# Patient Record
Sex: Male | Born: 1935
Health system: Southern US, Community
[De-identification: ages and names within clinical notes are randomized; demographics above are authoritative.]

## PROBLEM LIST (undated history)

## (undated) DIAGNOSIS — K2100 Gastro-esophageal reflux disease with esophagitis, without bleeding: Secondary | ICD-10-CM

## (undated) DIAGNOSIS — E039 Hypothyroidism, unspecified: Secondary | ICD-10-CM

## (undated) DIAGNOSIS — K5790 Diverticulosis of intestine, part unspecified, without perforation or abscess without bleeding: Secondary | ICD-10-CM

## (undated) DIAGNOSIS — Z860101 Personal history of adenomatous and serrated colon polyps: Secondary | ICD-10-CM

## (undated) DIAGNOSIS — E349 Endocrine disorder, unspecified: Secondary | ICD-10-CM

## (undated) DIAGNOSIS — E785 Hyperlipidemia, unspecified: Secondary | ICD-10-CM

## (undated) DIAGNOSIS — I2699 Other pulmonary embolism without acute cor pulmonale: Secondary | ICD-10-CM

## (undated) DIAGNOSIS — K219 Gastro-esophageal reflux disease without esophagitis: Secondary | ICD-10-CM

## (undated) DIAGNOSIS — K589 Irritable bowel syndrome without diarrhea: Secondary | ICD-10-CM

## (undated) DIAGNOSIS — Z86010 Personal history of colonic polyps: Secondary | ICD-10-CM

## (undated) DIAGNOSIS — N200 Calculus of kidney: Secondary | ICD-10-CM

## (undated) DIAGNOSIS — K56609 Unspecified intestinal obstruction, unspecified as to partial versus complete obstruction: Secondary | ICD-10-CM

## (undated) DIAGNOSIS — M4800 Spinal stenosis, site unspecified: Secondary | ICD-10-CM

## (undated) DIAGNOSIS — K648 Other hemorrhoids: Secondary | ICD-10-CM

## (undated) DIAGNOSIS — Z87442 Personal history of urinary calculi: Secondary | ICD-10-CM

## (undated) DIAGNOSIS — H9190 Unspecified hearing loss, unspecified ear: Secondary | ICD-10-CM

## (undated) DIAGNOSIS — D649 Anemia, unspecified: Secondary | ICD-10-CM

## (undated) DIAGNOSIS — I10 Essential (primary) hypertension: Secondary | ICD-10-CM

## (undated) DIAGNOSIS — M1990 Unspecified osteoarthritis, unspecified site: Secondary | ICD-10-CM

## (undated) DIAGNOSIS — N189 Chronic kidney disease, unspecified: Secondary | ICD-10-CM

## (undated) HISTORY — DX: Personal history of colonic polyps: Z86.010

## (undated) HISTORY — DX: Irritable bowel syndrome, unspecified: K58.9

## (undated) HISTORY — PX: TOTAL SHOULDER REPLACEMENT: SUR1217

## (undated) HISTORY — DX: Other hemorrhoids: K64.8

## (undated) HISTORY — DX: Unspecified intestinal obstruction, unspecified as to partial versus complete obstruction: K56.609

## (undated) HISTORY — DX: Diverticulosis of intestine, part unspecified, without perforation or abscess without bleeding: K57.90

## (undated) HISTORY — PX: JOINT REPLACEMENT: SHX530

## (undated) HISTORY — DX: Spinal stenosis, site unspecified: M48.00

## (undated) HISTORY — DX: Hemochromatosis, unspecified: E83.119

## (undated) HISTORY — PX: TONSILLECTOMY AND ADENOIDECTOMY: SUR1326

## (undated) HISTORY — DX: Endocrine disorder, unspecified: E34.9

## (undated) HISTORY — DX: Gastro-esophageal reflux disease with esophagitis: K21.0

## (undated) HISTORY — DX: Personal history of adenomatous and serrated colon polyps: Z86.0101

## (undated) HISTORY — DX: Calculus of kidney: N20.0

## (undated) HISTORY — DX: Hypothyroidism, unspecified: E03.9

## (undated) HISTORY — DX: Unspecified osteoarthritis, unspecified site: M19.90

## (undated) HISTORY — DX: Gastro-esophageal reflux disease with esophagitis, without bleeding: K21.00

## (undated) HISTORY — PX: OTHER SURGICAL HISTORY: SHX169

## (undated) HISTORY — PX: ROTATOR CUFF REPAIR: SHX139

## (undated) HISTORY — PX: APPENDECTOMY: SHX54

## (undated) HISTORY — DX: Hyperlipidemia, unspecified: E78.5

## (undated) HISTORY — PX: TOTAL KNEE ARTHROPLASTY: SHX125

## (undated) HISTORY — DX: Essential (primary) hypertension: I10

## (undated) HISTORY — DX: Anemia, unspecified: D64.9

---

## 1978-10-01 DIAGNOSIS — Z87442 Personal history of urinary calculi: Secondary | ICD-10-CM

## 1978-10-01 HISTORY — PX: MIDDLE EAR SURGERY: SHX713

## 1978-10-01 HISTORY — DX: Personal history of urinary calculi: Z87.442

## 1979-10-02 HISTORY — PX: OTHER SURGICAL HISTORY: SHX169

## 1998-03-11 ENCOUNTER — Ambulatory Visit (HOSPITAL_COMMUNITY)
Admission: RE | Admit: 1998-03-11 | Discharge: 1998-03-11 | Payer: Self-pay | Admitting: Physical Medicine & Rehabilitation

## 1999-01-10 ENCOUNTER — Encounter (HOSPITAL_COMMUNITY): Admission: RE | Admit: 1999-01-10 | Discharge: 1999-04-10 | Payer: Self-pay | Admitting: Gastroenterology

## 1999-01-10 ENCOUNTER — Ambulatory Visit (HOSPITAL_COMMUNITY): Admission: RE | Admit: 1999-01-10 | Discharge: 1999-01-10 | Payer: Self-pay | Admitting: Specialist

## 1999-01-10 ENCOUNTER — Encounter: Payer: Self-pay | Admitting: Specialist

## 1999-04-11 ENCOUNTER — Encounter (HOSPITAL_COMMUNITY): Admission: RE | Admit: 1999-04-11 | Discharge: 1999-07-10 | Payer: Self-pay | Admitting: Gastroenterology

## 1999-09-28 ENCOUNTER — Ambulatory Visit (HOSPITAL_COMMUNITY): Admission: RE | Admit: 1999-09-28 | Discharge: 1999-09-28 | Payer: Self-pay | Admitting: Gastroenterology

## 2002-08-12 ENCOUNTER — Inpatient Hospital Stay (HOSPITAL_COMMUNITY): Admission: EM | Admit: 2002-08-12 | Discharge: 2002-08-18 | Payer: Self-pay | Admitting: Emergency Medicine

## 2002-08-12 ENCOUNTER — Encounter: Payer: Self-pay | Admitting: Emergency Medicine

## 2002-08-13 ENCOUNTER — Encounter: Payer: Self-pay | Admitting: Gastroenterology

## 2002-08-14 ENCOUNTER — Encounter: Payer: Self-pay | Admitting: Gastroenterology

## 2002-08-15 ENCOUNTER — Encounter (INDEPENDENT_AMBULATORY_CARE_PROVIDER_SITE_OTHER): Payer: Self-pay | Admitting: *Deleted

## 2002-08-15 ENCOUNTER — Encounter: Payer: Self-pay | Admitting: Internal Medicine

## 2002-08-16 ENCOUNTER — Encounter: Payer: Self-pay | Admitting: Internal Medicine

## 2002-08-16 ENCOUNTER — Encounter: Payer: Self-pay | Admitting: Gastroenterology

## 2002-08-17 ENCOUNTER — Encounter: Payer: Self-pay | Admitting: Gastroenterology

## 2002-08-17 ENCOUNTER — Encounter (INDEPENDENT_AMBULATORY_CARE_PROVIDER_SITE_OTHER): Payer: Self-pay | Admitting: *Deleted

## 2002-08-24 ENCOUNTER — Encounter (INDEPENDENT_AMBULATORY_CARE_PROVIDER_SITE_OTHER): Payer: Self-pay | Admitting: *Deleted

## 2002-10-01 DIAGNOSIS — K56609 Unspecified intestinal obstruction, unspecified as to partial versus complete obstruction: Secondary | ICD-10-CM

## 2002-10-01 HISTORY — DX: Unspecified intestinal obstruction, unspecified as to partial versus complete obstruction: K56.609

## 2003-01-31 ENCOUNTER — Encounter: Payer: Self-pay | Admitting: Emergency Medicine

## 2003-01-31 ENCOUNTER — Inpatient Hospital Stay (HOSPITAL_COMMUNITY): Admission: EM | Admit: 2003-01-31 | Discharge: 2003-02-03 | Payer: Self-pay | Admitting: Emergency Medicine

## 2003-01-31 ENCOUNTER — Encounter (INDEPENDENT_AMBULATORY_CARE_PROVIDER_SITE_OTHER): Payer: Self-pay | Admitting: *Deleted

## 2003-02-01 ENCOUNTER — Encounter: Payer: Self-pay | Admitting: Internal Medicine

## 2003-02-01 ENCOUNTER — Encounter (INDEPENDENT_AMBULATORY_CARE_PROVIDER_SITE_OTHER): Payer: Self-pay | Admitting: *Deleted

## 2003-02-02 ENCOUNTER — Encounter: Payer: Self-pay | Admitting: Internal Medicine

## 2003-02-03 ENCOUNTER — Encounter (INDEPENDENT_AMBULATORY_CARE_PROVIDER_SITE_OTHER): Payer: Self-pay | Admitting: *Deleted

## 2003-10-02 HISTORY — PX: REFRACTIVE SURGERY: SHX103

## 2003-10-02 HISTORY — PX: OTHER SURGICAL HISTORY: SHX169

## 2003-10-02 HISTORY — PX: COLONOSCOPY W/ POLYPECTOMY: SHX1380

## 2004-09-13 ENCOUNTER — Ambulatory Visit: Payer: Self-pay | Admitting: Internal Medicine

## 2004-12-04 ENCOUNTER — Encounter: Admission: RE | Admit: 2004-12-04 | Discharge: 2004-12-04 | Payer: Self-pay | Admitting: Neurological Surgery

## 2005-01-15 ENCOUNTER — Ambulatory Visit: Payer: Self-pay | Admitting: Internal Medicine

## 2005-01-22 ENCOUNTER — Ambulatory Visit: Payer: Self-pay | Admitting: Internal Medicine

## 2005-04-09 ENCOUNTER — Ambulatory Visit: Payer: Self-pay | Admitting: Internal Medicine

## 2005-05-01 ENCOUNTER — Ambulatory Visit: Payer: Self-pay | Admitting: Internal Medicine

## 2005-08-16 ENCOUNTER — Ambulatory Visit: Payer: Self-pay | Admitting: Internal Medicine

## 2005-08-17 ENCOUNTER — Ambulatory Visit: Payer: Self-pay | Admitting: Internal Medicine

## 2005-08-28 ENCOUNTER — Ambulatory Visit: Payer: Self-pay | Admitting: Internal Medicine

## 2006-02-22 ENCOUNTER — Ambulatory Visit: Payer: Self-pay | Admitting: Internal Medicine

## 2006-09-20 ENCOUNTER — Ambulatory Visit: Payer: Self-pay | Admitting: Internal Medicine

## 2006-09-20 LAB — CONVERTED CEMR LAB
Creatinine,U: 247.9 mg/dL
Microalb Creat Ratio: 4.8 mg/g (ref 0.0–30.0)
Microalb, Ur: 1.2 mg/dL (ref 0.0–1.9)

## 2007-02-18 DIAGNOSIS — M48061 Spinal stenosis, lumbar region without neurogenic claudication: Secondary | ICD-10-CM

## 2007-02-18 DIAGNOSIS — D638 Anemia in other chronic diseases classified elsewhere: Secondary | ICD-10-CM

## 2007-02-18 DIAGNOSIS — K210 Gastro-esophageal reflux disease with esophagitis: Secondary | ICD-10-CM

## 2007-02-20 ENCOUNTER — Ambulatory Visit: Payer: Self-pay | Admitting: Internal Medicine

## 2007-04-03 ENCOUNTER — Encounter (INDEPENDENT_AMBULATORY_CARE_PROVIDER_SITE_OTHER): Payer: Self-pay | Admitting: *Deleted

## 2007-05-02 ENCOUNTER — Encounter: Payer: Self-pay | Admitting: Internal Medicine

## 2007-05-14 ENCOUNTER — Ambulatory Visit: Payer: Self-pay | Admitting: Internal Medicine

## 2007-05-14 DIAGNOSIS — E291 Testicular hypofunction: Secondary | ICD-10-CM

## 2007-05-26 ENCOUNTER — Telehealth (INDEPENDENT_AMBULATORY_CARE_PROVIDER_SITE_OTHER): Payer: Self-pay | Admitting: *Deleted

## 2007-05-27 ENCOUNTER — Ambulatory Visit: Payer: Self-pay | Admitting: Surgery

## 2007-05-27 ENCOUNTER — Encounter (INDEPENDENT_AMBULATORY_CARE_PROVIDER_SITE_OTHER): Payer: Self-pay | Admitting: Orthopedic Surgery

## 2007-05-27 ENCOUNTER — Inpatient Hospital Stay (HOSPITAL_COMMUNITY): Admission: AD | Admit: 2007-05-27 | Discharge: 2007-05-30 | Payer: Self-pay | Admitting: Orthopedic Surgery

## 2007-06-12 ENCOUNTER — Ambulatory Visit: Payer: Self-pay | Admitting: Internal Medicine

## 2007-06-27 ENCOUNTER — Encounter: Payer: Self-pay | Admitting: Internal Medicine

## 2007-07-16 ENCOUNTER — Ambulatory Visit: Payer: Self-pay | Admitting: Internal Medicine

## 2007-07-17 ENCOUNTER — Ambulatory Visit: Payer: Self-pay | Admitting: Internal Medicine

## 2007-08-11 ENCOUNTER — Encounter: Admission: RE | Admit: 2007-08-11 | Discharge: 2007-10-01 | Payer: Self-pay | Admitting: Orthopedic Surgery

## 2007-08-15 ENCOUNTER — Telehealth (INDEPENDENT_AMBULATORY_CARE_PROVIDER_SITE_OTHER): Payer: Self-pay | Admitting: *Deleted

## 2007-08-18 ENCOUNTER — Encounter: Payer: Self-pay | Admitting: Internal Medicine

## 2007-08-20 ENCOUNTER — Ambulatory Visit: Payer: Self-pay | Admitting: Internal Medicine

## 2007-09-01 ENCOUNTER — Encounter: Payer: Self-pay | Admitting: Internal Medicine

## 2007-09-08 ENCOUNTER — Encounter: Payer: Self-pay | Admitting: Internal Medicine

## 2007-09-18 ENCOUNTER — Ambulatory Visit: Payer: Self-pay | Admitting: Internal Medicine

## 2007-09-22 ENCOUNTER — Telehealth (INDEPENDENT_AMBULATORY_CARE_PROVIDER_SITE_OTHER): Payer: Self-pay | Admitting: *Deleted

## 2007-10-06 ENCOUNTER — Telehealth (INDEPENDENT_AMBULATORY_CARE_PROVIDER_SITE_OTHER): Payer: Self-pay | Admitting: *Deleted

## 2007-10-07 ENCOUNTER — Ambulatory Visit: Payer: Self-pay | Admitting: Internal Medicine

## 2007-10-09 LAB — CONVERTED CEMR LAB
AST: 20 units/L (ref 0–37)
Albumin: 4 g/dL (ref 3.5–5.2)
Alkaline Phosphatase: 66 units/L (ref 39–117)
BUN: 9 mg/dL (ref 6–23)
Creatinine, Ser: 1 mg/dL (ref 0.4–1.5)
Direct LDL: 109 mg/dL
HDL: 30.4 mg/dL — ABNORMAL LOW (ref >39.0)
Microalb, Ur: 0.7 mg/dL (ref 0.0–1.9)
Total Bilirubin: 0.9 mg/dL (ref 0.3–1.2)
Total Protein: 6.7 g/dL (ref 6.0–8.3)
Triglycerides: 247 mg/dL (ref 0–149)

## 2007-10-10 ENCOUNTER — Encounter (INDEPENDENT_AMBULATORY_CARE_PROVIDER_SITE_OTHER): Payer: Self-pay | Admitting: *Deleted

## 2007-10-16 ENCOUNTER — Ambulatory Visit: Payer: Self-pay | Admitting: Internal Medicine

## 2007-10-16 DIAGNOSIS — E785 Hyperlipidemia, unspecified: Secondary | ICD-10-CM | POA: Insufficient documentation

## 2007-10-16 LAB — CONVERTED CEMR LAB
Cholesterol, target level: 200 mg/dL
LDL Goal: 100 mg/dL

## 2007-10-22 ENCOUNTER — Telehealth (INDEPENDENT_AMBULATORY_CARE_PROVIDER_SITE_OTHER): Payer: Self-pay | Admitting: *Deleted

## 2007-11-14 ENCOUNTER — Ambulatory Visit: Payer: Self-pay | Admitting: Internal Medicine

## 2007-12-15 ENCOUNTER — Encounter: Payer: Self-pay | Admitting: Internal Medicine

## 2007-12-23 ENCOUNTER — Encounter: Payer: Self-pay | Admitting: Internal Medicine

## 2007-12-25 ENCOUNTER — Encounter: Payer: Self-pay | Admitting: Internal Medicine

## 2008-01-07 ENCOUNTER — Telehealth (INDEPENDENT_AMBULATORY_CARE_PROVIDER_SITE_OTHER): Payer: Self-pay | Admitting: *Deleted

## 2008-01-16 ENCOUNTER — Ambulatory Visit: Payer: Self-pay | Admitting: Internal Medicine

## 2008-01-24 LAB — CONVERTED CEMR LAB
Creatinine,U: 169 mg/dL
Ferritin: 17.9 ng/mL — ABNORMAL LOW (ref 22.0–322.0)
HDL: 32.1 mg/dL — ABNORMAL LOW (ref >39.0)
Hgb A1c MFr Bld: 6.7 % — ABNORMAL HIGH (ref 4.6–6.0)
LDL Cholesterol: 102 mg/dL — ABNORMAL HIGH (ref 0–99)
Total CHOL/HDL Ratio: 5.3
Triglycerides: 173 mg/dL — ABNORMAL HIGH (ref 0–149)
VLDL: 35 mg/dL (ref 0–40)

## 2008-01-25 ENCOUNTER — Encounter (INDEPENDENT_AMBULATORY_CARE_PROVIDER_SITE_OTHER): Payer: Self-pay | Admitting: *Deleted

## 2008-01-26 ENCOUNTER — Ambulatory Visit: Payer: Self-pay | Admitting: Internal Medicine

## 2008-02-13 ENCOUNTER — Ambulatory Visit: Payer: Self-pay | Admitting: Internal Medicine

## 2008-03-15 ENCOUNTER — Ambulatory Visit: Payer: Self-pay | Admitting: Internal Medicine

## 2008-04-13 ENCOUNTER — Telehealth (INDEPENDENT_AMBULATORY_CARE_PROVIDER_SITE_OTHER): Payer: Self-pay | Admitting: *Deleted

## 2008-04-15 ENCOUNTER — Ambulatory Visit: Payer: Self-pay | Admitting: Internal Medicine

## 2008-04-22 ENCOUNTER — Encounter: Payer: Self-pay | Admitting: Family Medicine

## 2008-05-20 ENCOUNTER — Ambulatory Visit: Payer: Self-pay | Admitting: Internal Medicine

## 2008-05-24 ENCOUNTER — Encounter (INDEPENDENT_AMBULATORY_CARE_PROVIDER_SITE_OTHER): Payer: Self-pay | Admitting: *Deleted

## 2008-05-24 LAB — CONVERTED CEMR LAB
Hgb A1c MFr Bld: 6.3 % — ABNORMAL HIGH (ref 4.6–6.0)
LDL Cholesterol: 65 mg/dL (ref 0–99)
Total CHOL/HDL Ratio: 4
Triglycerides: 109 mg/dL (ref 0–149)

## 2008-05-27 ENCOUNTER — Ambulatory Visit: Payer: Self-pay | Admitting: Internal Medicine

## 2008-05-27 DIAGNOSIS — I10 Essential (primary) hypertension: Secondary | ICD-10-CM

## 2008-06-10 ENCOUNTER — Encounter (INDEPENDENT_AMBULATORY_CARE_PROVIDER_SITE_OTHER): Payer: Self-pay | Admitting: *Deleted

## 2008-06-24 ENCOUNTER — Ambulatory Visit: Payer: Self-pay | Admitting: Internal Medicine

## 2008-07-19 ENCOUNTER — Ambulatory Visit: Payer: Self-pay | Admitting: Internal Medicine

## 2008-07-28 ENCOUNTER — Telehealth (INDEPENDENT_AMBULATORY_CARE_PROVIDER_SITE_OTHER): Payer: Self-pay | Admitting: *Deleted

## 2008-08-16 ENCOUNTER — Encounter: Payer: Self-pay | Admitting: Internal Medicine

## 2008-08-17 ENCOUNTER — Telehealth (INDEPENDENT_AMBULATORY_CARE_PROVIDER_SITE_OTHER): Payer: Self-pay | Admitting: *Deleted

## 2008-08-18 ENCOUNTER — Ambulatory Visit: Payer: Self-pay | Admitting: Internal Medicine

## 2008-08-23 ENCOUNTER — Telehealth (INDEPENDENT_AMBULATORY_CARE_PROVIDER_SITE_OTHER): Payer: Self-pay | Admitting: *Deleted

## 2008-09-16 ENCOUNTER — Ambulatory Visit: Payer: Self-pay | Admitting: Internal Medicine

## 2008-10-26 ENCOUNTER — Ambulatory Visit: Payer: Self-pay | Admitting: Internal Medicine

## 2008-11-03 ENCOUNTER — Encounter (INDEPENDENT_AMBULATORY_CARE_PROVIDER_SITE_OTHER): Payer: Self-pay | Admitting: *Deleted

## 2008-11-08 ENCOUNTER — Ambulatory Visit: Payer: Self-pay | Admitting: Internal Medicine

## 2008-11-08 LAB — CONVERTED CEMR LAB: Creatinine, Ser: 1.1 mg/dL (ref 0.4–1.5)

## 2008-11-10 ENCOUNTER — Ambulatory Visit: Payer: Self-pay | Admitting: Internal Medicine

## 2008-11-10 DIAGNOSIS — R109 Unspecified abdominal pain: Secondary | ICD-10-CM | POA: Insufficient documentation

## 2008-11-14 LAB — CONVERTED CEMR LAB
Basophils Relative: 0.2 % (ref 0.0–3.0)
Eosinophils Absolute: 0.1 10*3/uL (ref 0.0–0.7)
Eosinophils Relative: 1.8 % (ref 0.0–5.0)
Lymphocytes Relative: 19.4 % (ref 12.0–46.0)
MCV: 88.4 fL (ref 78.0–100.0)
Monocytes Relative: 5.2 % (ref 3.0–12.0)
Neutrophils Relative %: 73.4 % (ref 43.0–77.0)
RBC: 4.9 M/uL (ref 4.22–5.81)
Saturation Ratios: 55.4 % — ABNORMAL HIGH (ref 20.0–50.0)
WBC: 7.5 10*3/uL (ref 4.5–10.5)

## 2008-11-15 ENCOUNTER — Encounter (INDEPENDENT_AMBULATORY_CARE_PROVIDER_SITE_OTHER): Payer: Self-pay | Admitting: *Deleted

## 2008-12-20 ENCOUNTER — Ambulatory Visit: Payer: Self-pay | Admitting: Internal Medicine

## 2009-01-03 ENCOUNTER — Telehealth: Payer: Self-pay | Admitting: Internal Medicine

## 2009-02-15 ENCOUNTER — Encounter: Payer: Self-pay | Admitting: Internal Medicine

## 2009-02-22 ENCOUNTER — Encounter: Payer: Self-pay | Admitting: Internal Medicine

## 2009-03-08 ENCOUNTER — Telehealth (INDEPENDENT_AMBULATORY_CARE_PROVIDER_SITE_OTHER): Payer: Self-pay | Admitting: *Deleted

## 2009-05-03 ENCOUNTER — Telehealth (INDEPENDENT_AMBULATORY_CARE_PROVIDER_SITE_OTHER): Payer: Self-pay | Admitting: *Deleted

## 2009-05-18 ENCOUNTER — Telehealth (INDEPENDENT_AMBULATORY_CARE_PROVIDER_SITE_OTHER): Payer: Self-pay | Admitting: *Deleted

## 2009-05-18 ENCOUNTER — Ambulatory Visit: Payer: Self-pay | Admitting: Internal Medicine

## 2009-05-21 LAB — CONVERTED CEMR LAB: TSH: 0.61 microintl units/mL (ref 0.35–5.50)

## 2009-05-23 ENCOUNTER — Encounter (INDEPENDENT_AMBULATORY_CARE_PROVIDER_SITE_OTHER): Payer: Self-pay | Admitting: *Deleted

## 2009-05-23 ENCOUNTER — Telehealth (INDEPENDENT_AMBULATORY_CARE_PROVIDER_SITE_OTHER): Payer: Self-pay | Admitting: *Deleted

## 2009-06-10 ENCOUNTER — Encounter: Payer: Self-pay | Admitting: Internal Medicine

## 2009-06-15 ENCOUNTER — Telehealth (INDEPENDENT_AMBULATORY_CARE_PROVIDER_SITE_OTHER): Payer: Self-pay | Admitting: *Deleted

## 2009-07-07 ENCOUNTER — Ambulatory Visit: Payer: Self-pay | Admitting: Internal Medicine

## 2009-07-07 DIAGNOSIS — E039 Hypothyroidism, unspecified: Secondary | ICD-10-CM | POA: Insufficient documentation

## 2009-07-07 DIAGNOSIS — M1990 Unspecified osteoarthritis, unspecified site: Secondary | ICD-10-CM | POA: Insufficient documentation

## 2009-07-08 ENCOUNTER — Encounter (INDEPENDENT_AMBULATORY_CARE_PROVIDER_SITE_OTHER): Payer: Self-pay | Admitting: *Deleted

## 2009-08-08 ENCOUNTER — Encounter: Payer: Self-pay | Admitting: Internal Medicine

## 2009-08-08 ENCOUNTER — Inpatient Hospital Stay (HOSPITAL_COMMUNITY): Admission: RE | Admit: 2009-08-08 | Discharge: 2009-08-11 | Payer: Self-pay | Admitting: Orthopedic Surgery

## 2009-08-08 ENCOUNTER — Telehealth: Payer: Self-pay | Admitting: Internal Medicine

## 2009-09-05 ENCOUNTER — Encounter: Admission: RE | Admit: 2009-09-05 | Discharge: 2009-09-29 | Payer: Self-pay | Admitting: Orthopedic Surgery

## 2009-09-29 ENCOUNTER — Encounter: Admission: RE | Admit: 2009-09-29 | Discharge: 2009-10-31 | Payer: Self-pay | Admitting: Orthopedic Surgery

## 2009-10-03 ENCOUNTER — Telehealth (INDEPENDENT_AMBULATORY_CARE_PROVIDER_SITE_OTHER): Payer: Self-pay | Admitting: *Deleted

## 2009-10-12 ENCOUNTER — Ambulatory Visit: Payer: Self-pay | Admitting: Internal Medicine

## 2009-10-14 ENCOUNTER — Encounter: Payer: Self-pay | Admitting: Internal Medicine

## 2009-10-16 LAB — CONVERTED CEMR LAB: TSH: 1.19 microintl units/mL (ref 0.35–5.50)

## 2009-10-17 ENCOUNTER — Encounter (INDEPENDENT_AMBULATORY_CARE_PROVIDER_SITE_OTHER): Payer: Self-pay | Admitting: *Deleted

## 2009-11-29 ENCOUNTER — Telehealth (INDEPENDENT_AMBULATORY_CARE_PROVIDER_SITE_OTHER): Payer: Self-pay | Admitting: *Deleted

## 2009-12-20 ENCOUNTER — Telehealth (INDEPENDENT_AMBULATORY_CARE_PROVIDER_SITE_OTHER): Payer: Self-pay | Admitting: *Deleted

## 2010-01-12 ENCOUNTER — Encounter: Payer: Self-pay | Admitting: Internal Medicine

## 2010-02-08 ENCOUNTER — Encounter: Payer: Self-pay | Admitting: Internal Medicine

## 2010-03-13 ENCOUNTER — Telehealth (INDEPENDENT_AMBULATORY_CARE_PROVIDER_SITE_OTHER): Payer: Self-pay | Admitting: *Deleted

## 2010-03-22 ENCOUNTER — Encounter: Payer: Self-pay | Admitting: Internal Medicine

## 2010-03-23 ENCOUNTER — Encounter: Payer: Self-pay | Admitting: Internal Medicine

## 2010-05-17 ENCOUNTER — Encounter: Payer: Self-pay | Admitting: Internal Medicine

## 2010-07-10 ENCOUNTER — Encounter: Payer: Self-pay | Admitting: Internal Medicine

## 2010-08-15 ENCOUNTER — Encounter: Payer: Self-pay | Admitting: Internal Medicine

## 2010-08-16 ENCOUNTER — Telehealth (INDEPENDENT_AMBULATORY_CARE_PROVIDER_SITE_OTHER): Payer: Self-pay | Admitting: *Deleted

## 2010-08-28 ENCOUNTER — Ambulatory Visit: Payer: Self-pay | Admitting: Internal Medicine

## 2010-08-28 DIAGNOSIS — N453 Epididymo-orchitis: Secondary | ICD-10-CM | POA: Insufficient documentation

## 2010-08-28 LAB — CONVERTED CEMR LAB
BUN: 18 mg/dL (ref 6–23)
Basophils Relative: 0.4 % (ref 0.0–3.0)
Bilirubin Urine: NEGATIVE
Eosinophils Relative: 1.3 % (ref 0.0–5.0)
HCT: 41.7 % (ref 39.0–52.0)
Hemoglobin: 14.1 g/dL (ref 13.0–17.0)
Hgb A1c MFr Bld: 6.7 % — ABNORMAL HIGH (ref 4.6–6.5)
Lymphocytes Relative: 26.1 % (ref 12.0–46.0)
MCHC: 33.9 g/dL (ref 30.0–36.0)
Monocytes Relative: 11.9 % (ref 3.0–12.0)
Neutrophils Relative %: 60.3 % (ref 43.0–77.0)
PSA: 0.86 ng/mL (ref 0.10–4.00)
Potassium: 4.8 meq/L (ref 3.5–5.1)
Protein, U semiquant: NEGATIVE
Specific Gravity, Urine: 1.025
Urobilinogen, UA: 0.2
pH: 6

## 2010-08-29 ENCOUNTER — Ambulatory Visit: Payer: Self-pay | Admitting: Diagnostic Radiology

## 2010-08-29 ENCOUNTER — Ambulatory Visit (HOSPITAL_BASED_OUTPATIENT_CLINIC_OR_DEPARTMENT_OTHER)
Admission: RE | Admit: 2010-08-29 | Discharge: 2010-08-29 | Payer: Self-pay | Source: Home / Self Care | Admitting: Internal Medicine

## 2010-08-30 ENCOUNTER — Encounter: Payer: Self-pay | Admitting: Internal Medicine

## 2010-09-11 ENCOUNTER — Telehealth (INDEPENDENT_AMBULATORY_CARE_PROVIDER_SITE_OTHER): Payer: Self-pay | Admitting: *Deleted

## 2010-09-14 ENCOUNTER — Encounter: Payer: Self-pay | Admitting: Internal Medicine

## 2010-09-20 ENCOUNTER — Telehealth: Payer: Self-pay | Admitting: Internal Medicine

## 2010-10-09 ENCOUNTER — Ambulatory Visit: Admit: 2010-10-09 | Payer: Self-pay | Admitting: Gastroenterology

## 2010-10-09 ENCOUNTER — Telehealth: Payer: Self-pay | Admitting: Internal Medicine

## 2010-10-09 ENCOUNTER — Other Ambulatory Visit: Payer: Self-pay | Admitting: Physician Assistant

## 2010-10-09 ENCOUNTER — Ambulatory Visit
Admission: RE | Admit: 2010-10-09 | Discharge: 2010-10-09 | Payer: Self-pay | Source: Home / Self Care | Attending: Gastroenterology | Admitting: Gastroenterology

## 2010-10-09 DIAGNOSIS — K625 Hemorrhage of anus and rectum: Secondary | ICD-10-CM | POA: Insufficient documentation

## 2010-10-09 DIAGNOSIS — K5730 Diverticulosis of large intestine without perforation or abscess without bleeding: Secondary | ICD-10-CM | POA: Insufficient documentation

## 2010-10-09 DIAGNOSIS — R1084 Generalized abdominal pain: Secondary | ICD-10-CM | POA: Insufficient documentation

## 2010-10-09 DIAGNOSIS — E118 Type 2 diabetes mellitus with unspecified complications: Secondary | ICD-10-CM | POA: Insufficient documentation

## 2010-10-09 DIAGNOSIS — R109 Unspecified abdominal pain: Secondary | ICD-10-CM | POA: Insufficient documentation

## 2010-10-09 DIAGNOSIS — R197 Diarrhea, unspecified: Secondary | ICD-10-CM | POA: Insufficient documentation

## 2010-10-09 DIAGNOSIS — R110 Nausea: Secondary | ICD-10-CM | POA: Insufficient documentation

## 2010-10-09 DIAGNOSIS — F419 Anxiety disorder, unspecified: Secondary | ICD-10-CM | POA: Insufficient documentation

## 2010-10-09 LAB — COMPREHENSIVE METABOLIC PANEL
ALT: 16 U/L (ref 0–53)
AST: 17 U/L (ref 0–37)
Albumin: 3.9 g/dL (ref 3.5–5.2)
Alkaline Phosphatase: 69 U/L (ref 39–117)
BUN: 13 mg/dL (ref 6–23)
CO2: 29 mEq/L (ref 19–32)
Calcium: 9.1 mg/dL (ref 8.4–10.5)
Chloride: 106 mEq/L (ref 96–112)
Creatinine, Ser: 1.1 mg/dL (ref 0.4–1.5)
GFR: 71.24 mL/min (ref >60.00)
Glucose, Bld: 89 mg/dL (ref 70–99)
Potassium: 4.3 mEq/L (ref 3.5–5.1)
Sodium: 144 mEq/L (ref 135–145)
Total Bilirubin: 0.7 mg/dL (ref 0.3–1.2)
Total Protein: 7.1 g/dL (ref 6.0–8.3)

## 2010-10-09 LAB — CBC WITH DIFFERENTIAL/PLATELET
Basophils Absolute: 0.1 10*3/uL (ref 0.0–0.1)
Basophils Relative: 0.6 % (ref 0.0–3.0)
Eosinophils Absolute: 0.2 10*3/uL (ref 0.0–0.7)
Eosinophils Relative: 2.1 % (ref 0.0–5.0)
HCT: 42.1 % (ref 39.0–52.0)
Hemoglobin: 13.8 g/dL (ref 13.0–17.0)
Lymphocytes Relative: 29.1 % (ref 12.0–46.0)
Lymphs Abs: 3 10*3/uL (ref 0.7–4.0)
MCHC: 32.8 g/dL (ref 30.0–36.0)
MCV: 90.8 fl (ref 78.0–100.0)
Monocytes Absolute: 0.7 10*3/uL (ref 0.1–1.0)
Monocytes Relative: 6.3 % (ref 3.0–12.0)
Neutro Abs: 6.4 10*3/uL (ref 1.4–7.7)
Neutrophils Relative %: 61.9 % (ref 43.0–77.0)
Platelets: 311 10*3/uL (ref 150.0–400.0)
RBC: 4.63 Mil/uL (ref 4.22–5.81)
RDW: 13.8 % (ref 11.5–14.6)
WBC: 10.4 10*3/uL (ref 4.5–10.5)

## 2010-10-09 LAB — HIGH SENSITIVITY CRP: CRP, High Sensitivity: 65.61 mg/L — ABNORMAL HIGH (ref 0.00–5.00)

## 2010-10-10 ENCOUNTER — Encounter
Admission: RE | Admit: 2010-10-10 | Discharge: 2010-10-10 | Payer: Self-pay | Source: Home / Self Care | Attending: Gastroenterology | Admitting: Gastroenterology

## 2010-10-16 ENCOUNTER — Encounter: Payer: Self-pay | Admitting: Internal Medicine

## 2010-10-16 ENCOUNTER — Ambulatory Visit
Admission: RE | Admit: 2010-10-16 | Discharge: 2010-10-16 | Payer: Self-pay | Source: Home / Self Care | Attending: Gastroenterology | Admitting: Gastroenterology

## 2010-10-16 DIAGNOSIS — R933 Abnormal findings on diagnostic imaging of other parts of digestive tract: Secondary | ICD-10-CM | POA: Insufficient documentation

## 2010-10-16 DIAGNOSIS — K5289 Other specified noninfective gastroenteritis and colitis: Secondary | ICD-10-CM | POA: Insufficient documentation

## 2010-10-20 ENCOUNTER — Telehealth: Payer: Self-pay | Admitting: Internal Medicine

## 2010-10-24 ENCOUNTER — Telehealth: Payer: Self-pay | Admitting: Internal Medicine

## 2010-10-26 ENCOUNTER — Telehealth (INDEPENDENT_AMBULATORY_CARE_PROVIDER_SITE_OTHER): Payer: Self-pay | Admitting: *Deleted

## 2010-10-29 LAB — CONVERTED CEMR LAB
BUN: 14 mg/dL (ref 6–23)
Creatinine, Ser: 1 mg/dL (ref 0.4–1.5)

## 2010-10-31 ENCOUNTER — Telehealth: Payer: Self-pay | Admitting: Internal Medicine

## 2010-10-31 ENCOUNTER — Other Ambulatory Visit: Payer: Self-pay | Admitting: Internal Medicine

## 2010-10-31 ENCOUNTER — Ambulatory Visit
Admission: RE | Admit: 2010-10-31 | Discharge: 2010-10-31 | Payer: Self-pay | Source: Home / Self Care | Attending: Internal Medicine | Admitting: Internal Medicine

## 2010-10-31 DIAGNOSIS — D126 Benign neoplasm of colon, unspecified: Secondary | ICD-10-CM

## 2010-10-31 LAB — GLUCOSE, CAPILLARY
Glucose-Capillary: 129 mg/dL — ABNORMAL HIGH (ref 70–99)
Glucose-Capillary: 121 mg/dL — ABNORMAL HIGH (ref 70–99)

## 2010-10-31 LAB — HM COLONOSCOPY

## 2010-11-01 ENCOUNTER — Encounter: Payer: Self-pay | Admitting: Gastroenterology

## 2010-11-01 ENCOUNTER — Telehealth (INDEPENDENT_AMBULATORY_CARE_PROVIDER_SITE_OTHER): Payer: Self-pay | Admitting: *Deleted

## 2010-11-02 NOTE — Progress Notes (Signed)
Summary: Refill Request  Phone Note Refill Request Call back at [phone] Message from:  Pharmacy on March 13, 2010 1:49 PM  Refills Requested: Medication #1:  XANAX 0.5 MG  TABS 1 by mouth q 8 hours as needed only   Dosage confirmed as above?Dosage Confirmed   Supply Requested: 1 month   Last Refilled: 12/09/2009 Trixie Rude  Next Appointment Scheduled: none Initial call taken by: Harold Barban,  March 13, 2010 1:49 PM  Follow-up for Phone Call        RX was faxed to [phone] Follow-up by: Shonna Chock,  March 13, 2010 3:49 PM    Prescriptions: XANAX 0.5 MG  TABS (ALPRAZOLAM) 1 by mouth q 8 hours as needed only  #90 x 1   Entered by:   Shonna Chock   Authorized by:   Marga Melnick MD   Signed by:   Shonna Chock on 03/13/2010   Method used:   Printed then faxed to ...       [facility] [address] [id number] (retail)       [address]       [place], [place]  [address]       Ph: [phone] or [phone]       Fax: [phone]   RxID:   [id number]

## 2010-11-02 NOTE — Progress Notes (Signed)
Summary: sinus infection  Phone Note Call from Patient   Summary of Call: Pt left Vm that he thinks that he might have a sinus infection. pt c/o yellowish/greenish mucous. Pt would like to have antibiotics rx. called pt back and informed pt that he would need OV for antibiotics to be rx. pt states that wife is having surgery tomorrow so he is unable to come in. Offer pt appt for another day, pt decline stating that he will have to see what is going to happen with his wife first and if he is unable to shake it then he will call for OV...............Marland KitchenFelecia Deloach CMA  November 29, 2009 4:20 PM

## 2010-11-02 NOTE — Letter (Signed)
Summary: Diabetic Instructions  [facility]  [address] [place], [place] [address]   Phone: [phone]  Fax: [phone]    Louis [name] [birth year] MRN: [id number]     X  ORAL DIABETIC MEDICATION INSTRUCTIONS  The day before your procedure:   Take your diabetic pill as you do normally  The day of your procedure:   Do not take your diabetic pill    We will check your blood sugar levels during the admission process and again in Recovery before discharging you home  ________________________________________________________________________  _  _   INSULIN (LONG ACTING) MEDICATION INSTRUCTIONS (Lantus, NPH, 70/30, Humulin, Novolin-N)   The day before your procedure:   Take  your regular evening dose    The day of your procedure:   Do not take your morning dose    _  _   INSULIN (SHORT ACTING) MEDICATION INSTRUCTIONS (Regular, Humulog, Novolog)   The day before your procedure:   Do not take your evening dose   The day of your procedure:   Do not take your morning dose   _  _   INSULIN PUMP MEDICATION INSTRUCTIONS  We will contact the physician managing your diabetic care for written dosage instructions for the day before your procedure and the day of your procedure.  Once we have received the instructions, we will contact you.

## 2010-11-02 NOTE — Progress Notes (Signed)
Summary: Refill Request: Controlled Med  Phone Note Refill Request Call back at [phone] Message from:  Pharmacy on October 24, 2010 11:08 AM  Refills Requested: Medication #1:  HYDROCODONE-ACETAMINOPHEN 10-325 MG  TABS prn   Dosage confirmed as above?Dosage Confirmed   Supply Requested: [address] [facility]   Next Appointment Scheduled: none Initial call taken by: Harold Barban,  October 24, 2010 11:08 AM  Follow-up for Phone Call        Alfonse Flavors can you provide specific instruction for Hydrocodone the pharmacy will not take (as needed) for instruction  Follow-up by: Shonna Chock CMA,  October 25, 2010 8:28 AM  Additional Follow-up for Phone Call Additional follow up Details #1::        1 every 6 hrs as needed     Additional Follow-up for Phone Call Additional follow up Details #2::    faxed.  Follow-up by: Lucious Groves CMA,  October 25, 2010 9:24 AM  New/Updated Medications: HYDROCODONE-ACETAMINOPHEN 10-325 MG  TABS (HYDROCODONE-ACETAMINOPHEN) 1 every 6 hrs prn Prescriptions: HYDROCODONE-ACETAMINOPHEN 10-325 MG  TABS (HYDROCODONE-ACETAMINOPHEN) 1 every 6 hrs prn  #30 x 0   Entered and Authorized by:   Marga Melnick MD   Signed by:   Marga Melnick MD on 10/25/2010   Method used:   Printed then faxed to ...       [facility] (mail-order)       [address], [address] [place]  [address]       Ph: [phone]       Fax: [phone]   RxID:   [id number]

## 2010-11-02 NOTE — Assessment & Plan Note (Signed)
Summary: abd. pain/rectal bleeding   History of Present Illness Visit Type: Initial Visit Primary GI MD: Yancey Flemings MD Primary Provider: Marga Melnick, MD Chief Complaint: abdominal pain in lower region with rectal bleeding.   Has hx. of diverticulosis History of Present Illness:   VERY NICE 75 YO MALE KNOWN REMOTELY TO DR. PERRY. HE HAD A COLONOSCOPY 7-8 YEARS AGO WHICH SHOWED DIVERTICULOSIS, AND A SMALL POLYP PER THE PT (CHART IS IN THE WAREHOUSE).  HE COMES IN TODAY AS AN URGENT ADD ON WITH C/O ONSET ON 10/06/09 WITH ACUTE INTENSE ABDOMINAL CRAMPING, AND DIAPHORESIS ABOUT 2 HOURS AFTER EATING A VERY SPICY CHICKEN DISH AT [employer]. THIS WAS FOLLOWED BY NAUSEA, AND DIARRHEA. A COUPLE HOURS LATER HE HAD MORE INTENSE CRAMPS AND DIARRHEA, AND EVENTUALLY STARTED PASSING SOME BLOOD. YESTERDAY HE FELT POORLY, NO APPETITE, DIDN'T EAT OR DRINK MUCH, AND CONTINUED WITH DIARRHEA AND BLOOD WHICH WAS RED, WITH SMALL CLOTS. TODAY HE FEELS A LITTLE BETTER, STILL SORE OVER HIS LOWER ABDOMEN, ANOREXIC, AND HAS JUST PASSED GAS WITH SMALL AMTS OF BLOOD. DENIES DIZZINESS, LIGHTHEAEDNESS ETC. HE DID TAKE A COURSE OF ABX IN NOVEMBER FOR A UTI. NO OTHER EXPOSURES, ETC.   GI Review of Systems    Reports abdominal pain, bloating, loss of appetite, and  nausea.     Location of  Abdominal pain: lower abdomen.    Denies acid reflux, belching, chest pain, dysphagia with liquids, dysphagia with solids, heartburn, vomiting, vomiting blood, weight loss, and  weight gain.      Reports change in bowel habits, diverticulosis, hemorrhoids, and  rectal bleeding.     Denies anal fissure, black tarry stools, constipation, diarrhea, fecal incontinence, heme positive stool, irritable bowel syndrome, jaundice, light color stool, liver problems, and  rectal pain. Preventive Screening-Counseling & Management  Alcohol-Tobacco     Smoking Status: quit      Drug Use:  no.    Current Medications (verified): 1)  Metformin Hcl 500 Mg  Tabs (Metformin Hcl) .Marland Kitchen.. 1 By Mouth Two Times A Day 2)  Lisinopril 20 Mg Tabs (Lisinopril) .Marland Kitchen.. 1 By Mouth Once Daily 3)  Levoxyl 125 Mcg Tabs (Levothyroxine Sodium) .Marland Kitchen.. 1 By Mouth Once Daily 4)  Alprazolam 0.5 Mg Tabs (Alprazolam) .... 2 Daily As Needed 5)  Prilosec 20 Mg Cpdr (Omeprazole) .Marland Kitchen.. 1 By Mouth Once Daily 6)  Hydrocodone-Acetaminophen 10-325 Mg Tabs (Hydrocodone-Acetaminophen) .... Take As Needed Pain 7)  Aspirin 81 Mg Tbec (Aspirin) .Marland Kitchen.. 1 By Mouth Once Daily 8)  Multivitamins  Tabs (Multiple Vitamin) .Marland Kitchen.. 1 By Mouth Once Daily 9)  Crestor 10 Mg Tabs (Rosuvastatin Calcium) .Marland Kitchen.. 1 By Mouth Once Daily 10)  [employer] Antihistamine Allergy 25 Mg Caps (Diphenhydramine Hcl) .Marland Kitchen.. 1 By Mouth Once Daily 11)  Folic Acid 1 Mg Tabs (Folic Acid) .Marland Kitchen.. 1 By Mouth Once Daily 12)  Methotrexate 2.5 Mg Tabs (Methotrexate Sodium) .Marland Kitchen.. 1 By Mouth Once Daily 13)  Glimepiride 2 Mg Tabs (Glimepiride) .Marland Kitchen.. 1 By Mouth Once Daily 14)  Calcium 600 Mg Tabs (Calcium) .... 2 By Mouth Once Daily 15)  Vitamin D 400 Unit Caps (Cholecalciferol) .Marland Kitchen.. 1 By Mouth Once Daily  Allergies (verified): 1)  ! * Shellfish  Past History:  Past Medical History: AODM RHEUMATOID ARTHRITIS Diverticulosis Hyperlipidemia Hypertension ANXIETY HYPOTHYROIDISM  Past Surgical History: Appendectomy  Family History: Reviewed history and no changes required. Family History of Diabetes:  No FH of Colon Cancer:  Social History: Reviewed history and no changes required. retired Married 2 boys  1 girl Patient is a former smoker.  Alcohol Use - no Illicit Drug Use - no Smoking Status:  quit Drug Use:  no  Review of Systems       The patient complains of anxiety-new, arthritis/joint pain, back pain, blood in urine, and hearing problems.  The patient denies allergy/sinus, anemia, breast changes/lumps, change in vision, confusion, cough, coughing up blood, depression-new, fainting, fatigue, fever, headaches-new, heart  murmur, heart rhythm changes, itching, menstrual pain, muscle pains/cramps, night sweats, nosebleeds, pregnancy symptoms, shortness of breath, skin rash, sleeping problems, sore throat, swelling of feet/legs, swollen lymph glands, thirst - excessive , urination - excessive , urination changes/pain, urine leakage, vision changes, and voice change.         SEE HPI  Vital Signs:  Patient profile:   75 year old male Height:      72 inches Weight:      243 pounds BMI:     33.08 Pulse rate:   126 / minute BP sitting:   128 / 88  (left arm)  Vitals Entered By: Milford Cage NCMA (October 09, 2010 2:49 PM)  Physical Exam  General:  Well developed, well nourished, no acute distress. Head:  Normocephalic and atraumatic. Eyes:  PERRLA, no icterus. Lungs:  Clear throughout to auscultation. Heart:  Regular rate and rhythm; no murmurs, rubs,  or bruits. Abdomen:  SOFT, TENDER ACROSS LOWER ABDOMEN,LEFT GREATER THAN RIGHT, NO GUARDING, NO MASS OR HSM,BS+ Rectal:  MUCOID RED BLOOD ON GLOVE,EXTERNAL NONBLEEDING HEMORRHOID Extremities:  No clubbing, cyanosis, edema or deformities noted. Neurologic:  Alert and  oriented x4;  grossly normal neurologically. Psych:  Alert and cooperative. Normal mood and affect.  Impression & Recommendations:  Problem # 1:  HEMORRHAGE OF RECTUM AND ANUS (ICD-569.3) Assessment New 75 YO MALE WITH ACUTE ILLNESS WITH GENERALIZED LOWER ABDOMINAL PAIN, INTENSE CRAMPING, NAUSEA, AND BLOODY DIARRHEA. R/O SEGMENTAL ISCHEMIC COLITIS, R/O ACUTE INFECTIOUS COLITIS. PUSH FLUIDS  FULL LIQUID/SOFT DIET LABS AS BELOW OFFERED ANTIEMETIC HE WILL USE HYDROCODONE WHICH HE HAS AT HOME AS NEEDED FOR PAIN START CIPRO 500 MG TWICE DAILY X 10 DAYS  START FLAGYL 500 MG TWICE DAILY X 10 DAYS SCHEDULE FOR CT ABDOMEN/PELVIS WITHIN NEXT 24 HOURS,FURTHER PLANS BASED ON FINDINGS HE WILL EVENTUALLY NEED A FOLLOW UP COLONOSCOPY WILL REVIEW  OLD PAPER CHART. Orders: TLB-CBC Platelet -  w/Differential (85025-CBCD) TLB-CRP-High Sensitivity (C-Reactive Protein) (86140-FCRP) TLB-CMP (Comprehensive Metabolic Pnl) (80053-COMP) CT Abdomen/Pelvis with Contrast (CT Abd/Pelvis w/con)  Problem # 2:  DIABETES MELLITUS-TYPE II (ICD-250.00) Assessment: Comment Only  Problem # 3:  HYPERTENSION (ICD-401.9) Assessment: Comment Only  Problem # 4:  ANXIETY (ICD-300.00) Assessment: Comment Only  Problem # 5:  DIVERTICULOSIS-COLON (ICD-562.10) Assessment: Comment Only  Patient Instructions: 1)  Please go to lab, basement level. 2)  We scheduled the CT scan for Tues 10-10-2010. Arrive to [facility] [address] Phone [phone]. 3)  Please go to [facility] l this afternoon to pick up the perscription and contrast . 4)  We sent perscriptions for the antibiotics to [facility] [place]. 5)  Push fluids. 6)  Use the pain medication as needed. 7)  Copy sent to :Dr. Marga Melnick  8)  The medication list was reviewed and reconciled.  All changed / newly prescribed medications were explained.  A complete medication list was provided to the patient / caregiver.  Prescriptions: METRONIDAZOLE 500 MG TABS (METRONIDAZOLE) Take 1 tab twice daily x 10 days  #20 x 0   Entered by:  Pam Peterman NCMA   Authorized by:   Sammuel Cooper PA-c   Signed by:   Lowry Ram NCMA on 10/09/2010   Method used:   Electronically to        [id number]* (retail)       [address]       [place], [place]  [address]       Ph: [phone]       Fax: [phone]   RxID:   [id number] CIPRO 500 MG TABS (CIPROFLOXACIN HCL) Take  1 tab twice daily x 10 days  #20 x 0   Entered by:   Lowry Ram NCMA   Authorized by:   Sammuel Cooper PA-c   Signed by:   Lowry Ram NCMA on 10/09/2010   Method used:   Electronically to        [id number] (retail)       [address]       [place], [place]  [address]       Ph: [phone]       Fax: [phone]   RxID:   [id number]

## 2010-11-02 NOTE — Letter (Signed)
Summary: [facility]   Imported By: Lanelle Bal 02/15/2010 09:47:43  _____________________________________________________________________  External Attachment:    Type:   Image     Comment:   External Document

## 2010-11-02 NOTE — Letter (Signed)
Summary: [facility]   Imported By: Lanelle Bal 01/25/2010 14:15:20  _____________________________________________________________________  External Attachment:    Type:   Image     Comment:   External Document

## 2010-11-02 NOTE — Medication Information (Signed)
Summary: Letter Regarding ACE Inhibitor/[facility]  Letter Regarding ACE Inhibitor/[facility]   Imported By: Lanelle Bal 05/08/2010 11:12:17  _____________________________________________________________________  External Attachment:    Type:   Image     Comment:   External Document

## 2010-11-02 NOTE — Letter (Signed)
Summary: [facility]   Imported By: Lanelle Bal 04/10/2010 10:59:47  _____________________________________________________________________  External Attachment:    Type:   Image     Comment:   External Document

## 2010-11-02 NOTE — Progress Notes (Signed)
Summary: Refill Request  Phone Note Refill Request Call back at [phone] Message from:  Pharmacy on October 26, 2010 1:20 PM  Refills Requested: Medication #1:  CRESTOR 10 MG  TABS 1/2 tab every other day- DUE FOR LABS NOW   Dosage confirmed as above?Dosage Confirmed   Supply Requested: 23   Last Refilled: 08/16/2010 [facility]  Next Appointment Scheduled: none Initial call taken by: Harold Barban,  October 26, 2010 1:20 PM  Follow-up for Phone Call        I spoke with repersenative from [facility] and informed her patient is due for Labs and she indicated she will contact the patient and informed him  Last chlosterol check 12/2007 Patient needs Lipid/Hep 272.4/995.20 Follow-up by: Shonna Chock CMA,  October 26, 2010 2:24 PM

## 2010-11-02 NOTE — Letter (Signed)
Summary: [facility]   Imported By: Lanelle Bal 10/04/2010 11:36:23  _____________________________________________________________________  External Attachment:    Type:   Image     Comment:   External Document

## 2010-11-02 NOTE — Progress Notes (Signed)
Summary: stil no better  Phone Note Call from Patient Call back at Home Phone [phone]   Caller: Patient Summary of Call: Pt Left VM that he is about 95% better but he still has some swelling. pt would like a refill on antibiotics. Pls advise..........Marland KitchenFelecia Deloach CMA  September 11, 2010 1:00 PM   Follow-up for Phone Call        Nitrofurantoin 100 mg two times a day # 14. Repeat urine C&S if not well Follow-up by: Marga Melnick MD,  September 11, 2010 1:45 PM  Additional Follow-up for Phone Call Additional follow up Details #1::        Patient's wife informed of information above./[facility] CMA  September 11, 2010 2:30 PM     Prescriptions: NITROFURANTOIN MONOHYD MACRO 100 MG CAPS (NITROFURANTOIN MONOHYD MACRO) 1 two times a day  #14 x 0   Entered by:   Shonna Chock CMA   Authorized by:   Marga Melnick MD   Signed by:   Shonna Chock CMA on 09/11/2010   Method used:   Electronically to        [id number] (retail)       [address]       [place], [place]  [address]       Ph: [phone]       Fax: [phone]   RxID:   [id number]

## 2010-11-02 NOTE — Assessment & Plan Note (Signed)
Summary: F/U CT scan results, Infec. Colitis   History of Present Illness Visit Type: Follow-up Visit Primary GI MD: Yancey Flemings MD Primary Provider: Marga Melnick, MD Chief Complaint: Patient here to follow up on his CT Scan. He still complains of lower abdominal pain better then the first visit but still constant. Yesterday he states that his pain was increased and he had 6 BMS. History of Present Illness:   VERY NICE 75 YO MALE WHO WAS SEEN ON 10/10/10 WITH ACUTE ABDOMINAL PAIN, AND BLOODY STOOLS . HE WAS FELT TO HAVE EITHER AN INFECTIOUS OR AN ISCHEMIC COLITIS. LABS WERE NORMAL EXCEPT FOR  A CRP OF 65!Marland Kitchen HE UNDERWENT CT ABDOMEN /PELVIS ON 1/10 WHICH CONFIRMED A LEFT SIDED COLITIS WITH CIRCUMFERENTIAL WALL THICKENING SPLENIC FLEXURE THROUGH THE SIGMOID,ONE SMALL LIVER LESION MOST CONSISTENT WITH A HEMANGIOMA.  HE WAS GIVEN A COURSE OF CIPRO AND FLAGYL EMPIRICALLY. HE COMES IN TODAY FOR FOLLOW UP STATING HE HAD BEEN FEELING BETTER, HAS NOT HAD ONGOING BLEEDING, AND STOOLS HAVE BEEN SOFT. HE STILL HAS SOME LOWER ABDOMINAL DISCOMFORT, AND APPETITE IS OFF. YESTERDAY HE HAD A BAD DAY WITH MORE CRAMPING, AND HAD 8 BM'S- BUT FORMED ,NONBLOODY STOOL. HE FEELS BETTER TODAY.   GI Review of Systems    Reports abdominal pain, loss of appetite, and  nausea.     Location of  Abdominal pain: lower abdomen.    Denies acid reflux, belching, bloating, chest pain, dysphagia with liquids, dysphagia with solids, heartburn, vomiting, vomiting blood, weight loss, and  weight gain.        Denies anal fissure, black tarry stools, change in bowel habit, constipation, diarrhea, diverticulosis, fecal incontinence, heme positive stool, hemorrhoids, irritable bowel syndrome, jaundice, light color stool, liver problems, rectal bleeding, and  rectal pain.    Current Medications (verified): 1)  Levoxyl 125 Mcg  Tabs (Levothyroxine Sodium) .... Take One Tablet Daily 2)  Metformin Hcl 500 Mg  Tb24 (Metformin Hcl) .Marland Kitchen.. 1 By  Mouth Two Times A Day 3)  Xanax 0.5 Mg  Tabs (Alprazolam) .Marland Kitchen.. 1 By Mouth Q 8 Hours As Needed Only 4)  Prilosec Otc 20 Mg  Tbec (Omeprazole Magnesium) .Marland Kitchen.. 1 By Mouth Qd 5)  Hydrocodone-Acetaminophen 10-325 Mg  Tabs (Hydrocodone-Acetaminophen) .... Prn 6)  Crestor 10 Mg  Tabs (Rosuvastatin Calcium) .... 1/2 Tab Every Other Day- Due For Labs Now 7)  Folic Acid 1 Mg  Tabs (Folic Acid) .Marland Kitchen.. 1 By Mouth Qd 8)  Methotrexate (Anti-Rheumatic) 2.5 Mg  Tabs (Methotrexate (Anti-Rheumatic)) .... 6 Tabs Q Week 9)  Multivitamin With No Iron .... Qd 10)  Calcium and Vit D 66/400 .... 2 Tabs Daily 11)  Glimepiride 2 Mg  Tabs (Glimepiride) .... 1/2 Q Am 12)  Freestyle Lancets  Misc (Lancets) .... Use As Directed Bid 13)  Freestyle Lite Test  Strp (Glucose Blood) .... Use As Directed Bid 14)  Metronidazole 500 Mg Tabs (Metronidazole) .... Take One By Mouth Two Times A Day 15)  Ciprofloxacin Hcl 500 Mg Tabs (Ciprofloxacin Hcl) .... Take One By Mouth Two Times A Day 16)  Lisinopril 20 Mg Tabs (Lisinopril) .... Take One By Mouth Once Daily  Allergies: 1)  ! * Shellfish 2)  ! * Ivp Dye  Past History:  Past Medical History: Anemia-NOS, PMH of AODM HTN HYPERLIPIDEMIA DIVERTICULOSIS  testosterone deficiency reflux esophagitis spinal stenosis, Dr Ethelene Hal hyperglycemia, fasting  hemochromatosis Arthritis , Dr Jimmy Footman , MTX Rx Hypothyroidism  Past Surgical History: Reviewed history from 07/07/2009 and no changes  required. Appendectomy Cataract extraction both eyes 2005 right and left ear surgery 6962,9528 Rotator cuff repair 3 episodes in 4132,4401 Tonsillectomy YAG laser right and left eye 2005 nerve root block, multiple (11 total)  2002,2005,2006,2007,2008, 0272,5366 Dr Ethelene Hal left knee cartilage  resected 1958 shoulder ball replacement and rotator cuff surgery 05/27/2007, Dr Ranell Patrick  Family History: Reviewed history from 07/07/2009 and no changes required. M aunt DM brother  DM, suicide  04/2003 father late onset DM, CVA, MI  @ age 86 mother  CHF 2 to valvular heart disease brother hemochromatosis  Social History: Reviewed history from 07/07/2009 and no changes required. Former Smoker 1982 Retired Married Regular exercise-no Alcohol use-no due to MTX Rx  Review of Systems       The patient complains of arthritis/joint pain and back pain.  The patient denies allergy/sinus, anemia, anxiety-new, blood in urine, breast changes/lumps, change in vision, confusion, cough, coughing up blood, depression-new, fainting, fatigue, fever, headaches-new, hearing problems, heart murmur, heart rhythm changes, itching, muscle pains/cramps, night sweats, nosebleeds, shortness of breath, skin rash, sleeping problems, sore throat, swelling of feet/legs, swollen lymph glands, thirst - excessive, urination - excessive, urination changes/pain, urine leakage, vision changes, and voice change.         SEE HPI  Vital Signs:  Patient profile:   75 year old male Height:      72 inches Weight:      243.4 pounds BMI:     33.13 Pulse rate:   74 / minute Pulse rhythm:   regular BP sitting:   116 / 66  (left arm) Cuff size:   regular  Vitals Entered By: Harlow Mares CMA Duncan Dull) (October 16, 2010 1:49 PM)  Physical Exam  General:  Well developed, well nourished, no acute distress. Head:  Normocephalic and atraumatic. Eyes:  PERRLA, no icterus. Lungs:  Clear throughout to auscultation. Heart:  Regular rate and rhythm; no murmurs, rubs,  or bruits. Abdomen:  LARGE ,SOFT, MILD TENDERNESS ACROSS LOWER ABDOMEN, AND LMQ, NO REBOUND, NO MASS OR HSM,BS+ Rectal:  NOT REPEATED Extremities:  No clubbing, cyanosis, edema or deformities noted. Neurologic:  Alert and  oriented x4;  grossly normal neurologically. Psych:  Alert and cooperative. Normal mood and affect.   Impression & Recommendations:  Problem # 1:  COLITIS (ICD-558.9) Assessment New 75 YO MALE WITH AN ACUTE COLITIS  WITH CT FINDINGS  OF A SEGMENTAL LEFT SIDED COLITIS ? ISCHEMIC VS. INFECTIOUS  VS LESS LIKELY IBD- PT IMPROVED BUT PERSISTENT MILD SXS.  HE IS ASKED TO COMPLETE 10 DAYS OF CIPRO AND FLAGYL THEN STOP CONTINUE HYDROCODONE AS NEEDED BLAND DIET/SOFT SCHEDULE FOR COLONOSCOPY WITH DR. PERRY. PROCEDURE DISCUSSED IN DETAIL WITH PT AND HIS WIFE. Orders: Colonoscopy (Colon)  Problem # 2:  DIVERTICULOSIS-COLON (ICD-562.10) Assessment: Comment Only  Problem # 3:  HYPERTENSION, ESSENTIAL NOS (ICD-401.9) Assessment: Comment Only  Problem # 4:  HYPERLIPIDEMIA (ICD-272.2) Assessment: Comment Only  Problem # 5:  DIABETES MELLITUS, TYPE II, (ICD-250.02) Assessment: Comment Only  Problem # 6:  DEGENERATIVE JOINT DISEASE (ICD-715.90) Assessment: Comment Only ?RA    MAY EXPLAIN ELEVATED CRP AS WELL   Patient Instructions: 1)  We have scheduled the Colonscopy  with Dr. Marina Goodell on 10-31-2010. 2)  We have also given you Diabetic medication instructions. 3)  We sent the colonoscopy prep  Perscription to [facility] in [place]. 4)  Copy sent to : Megan Salon, Md 5)  The medication list was reviewed and reconciled.  All changed / newly prescribed medications were  explained.  A complete medication list was provided to the patient / caregiver. Prescriptions: MOVIPREP 100 GM  SOLR (PEG-KCL-NACL-NASULF-NA ASC-C) As per prep instructions.  #1 x 0   Entered by:   Lowry Ram NCMA   Authorized by:   Sammuel Cooper PA-c   Signed by:   Lowry Ram NCMA on 10/16/2010   Method used:   Electronically to        [facility] (retail)       [address]       [place], [place]  [address]       Ph: [phone]       Fax: [phone]   RxID:   [id number]

## 2010-11-02 NOTE — Letter (Signed)
Summary: [facility]   Imported By: Lanelle Bal 09/11/2010 10:33:59  _____________________________________________________________________  External Attachment:    Type:   Image     Comment:   External Document

## 2010-11-02 NOTE — Assessment & Plan Note (Signed)
Vital Signs:  Patient profile:   75 year old male Weight:      243.4 pounds BMI:     33.13 Temp:     100.5 degrees F oral Pulse rate:   72 / minute Resp:     17 per minute BP sitting:   118 / 72  (left arm) Cuff size:   large  Vitals Entered By: Shonna Chock CMA (August 28, 2010 2:22 PM) CC: 1.) Fever, swelling in private area, lower abdominal pain, blood in urine, and refill meds , Abdominal pain, Type 2 diabetes mellitus follow-up   CC:  1.) Fever, swelling in private area, lower abdominal pain, blood in urine, and refill meds , Abdominal pain, and Type 2 diabetes mellitus follow-up.  History of Present Illness: Abdominal Pain      This is a 75 year old man who presents with Abdominal pain since 08/26/2010. Actually his wife states he had mid back pain 11/25.  The patient reports  some constipation, but denies nausea, vomiting, diarrhea, melena, hematochezia, anorexia, and hematemesis.  The location of the pain is right  &  left lower quadrant  and suprapubic area.  The pain is described as intermittent, cramping in quality, and radiating to the back.  Associated symptoms include fever, intermittent hematuria  and dysuria.  The patient denies the following symptoms: coke colored  urine.  His L testicle swelled 11/26. Fever began 11/27 ; temp up to 101. Rx: none. He is on MTX weekly. Type 2 Diabetes Mellitus Follow-Up      The patient is also here for  Metformin refill &  for Type 2 diabetes mellitus follow-up.  The patient denies polyuria, polydipsia, blurred vision, and numbness of extremities.  The patient denies the following symptoms: neuropathic pain, orthostatic symptoms, poor wound healing, intermittent claudication, vision loss, and foot ulcer.  Since the last visit the patient reports  fair  dietary compliance, not exercising regularly, and not monitoring blood glucose. He requests a new glucometer. The patient reports having had no  DM retinopathy. Hypertension Follow-Up  The patient also presents for Hypertension follow-up.  The patient denies lightheadedness, headaches, and fatigue.  The patient denies the following associated symptoms: palpitations, syncope, leg edema, and pedal edema.  Compliance with medications (by patient report) has been near 100%.  Adjunctive measures currently  NOT used by the patient include salt restriction.  BP @ home averages 140/68.  Current Medications (verified): 1)  Levoxyl 125 Mcg  Tabs (Levothyroxine Sodium) .... Take One Tablet Daily 2)  Metoprolol Succinate 100 Mg  Tb24 (Metoprolol Succinate) .... 1/2 Tab Qd 3)  Metformin Hcl 500 Mg  Tb24 (Metformin Hcl) .Marland Kitchen.. 1 By Mouth Two Times A Day 4)  Xanax 0.5 Mg  Tabs (Alprazolam) .Marland Kitchen.. 1 By Mouth Q 8 Hours As Needed Only 5)  Prilosec Otc 20 Mg  Tbec (Omeprazole Magnesium) .Marland Kitchen.. 1 By Mouth Qd 6)  Hydrocodone-Acetaminophen 10-325 Mg  Tabs (Hydrocodone-Acetaminophen) .... Prn 7)  Crestor 10 Mg  Tabs (Rosuvastatin Calcium) .... 1/2 Tab Every Other Day- Due For Labs Now 8)  Folic Acid 1 Mg  Tabs (Folic Acid) .Marland Kitchen.. 1 By Mouth Qd 9)  Methotrexate (Anti-Rheumatic) 2.5 Mg  Tabs (Methotrexate (Anti-Rheumatic)) .... 6 Tabs Q Week 10)  Vitamin D 04540 Unit  Caps (Ergocalciferol) .... One Cap Every Other Week 11)  Multivitamin With No Iron .... Qd 12)  Calcium and Vit D 66/400 .... 2 Tabs Daily 13)  Antihistamines 25mg  .... Prn 14)  Asa  81mg  .... Qd 15)  Testosterone 300mg  Im .... Q Month 16)  Glimepiride 2 Mg  Tabs (Glimepiride) .... 1/2 Q Am 17)  Lisinopril 20 Mg Tabs (Lisinopril) .Marland Kitchen.. 1 Once Daily 18)  Viagra 100 Mg Tabs (Sildenafil Citrate) .... As Needed 19)  Vitamin D (Ergocalciferol) 50000 Unit Caps (Ergocalciferol) .Marland Kitchen.. 1 By Mouth Every Other Week 20)  Doxycycline Hyclate 100 Mg Tabs (Doxycycline Hyclate) .Marland Kitchen.. 1 By Mouth Once Daily 21)  Doxycycline Hyclate 100 Mg Caps (Doxycycline Hyclate) .Marland Kitchen.. 1 Two Times A Day ; Avoid Sun 22)  Freestyle Lancets  Misc (Lancets) .... Use As Directed  Bid 23)  Freestyle Lite Test  Strp (Glucose Blood) .... Use As Directed Bid  Allergies: 1)  ! * Shellfish  Review of Systems Resp:  Denies cough and sputum productive. GI:  Denies bloody stools, dark tarry stools, and diarrhea.  Physical Exam  General:  well-nourished,in no acute distress; alert,appropriate and cooperative throughout examination Eyes:  No corneal or conjunctival inflammation noted.No icterus Ears:  Hearing loss hindered history Mouth:  Oral mucosa and oropharynx without lesions or exudates.  Mild  pharyngeal erythema.   Lungs:  Normal respiratory effort, chest expands symmetrically. Lungs are clear to auscultation, no crackles or wheezes. Heart:  Normal rate and regular rhythm. S1 and S2 normal without gallop, murmur, click, rub or other extra sounds. Abdomen:  Bowel sounds positive but slightly decreased,abdomen soft  but  tender RLQ  without masses, organomegaly . Ventral hernia noted. Genitalia:  R testicle normal; L srotum swollen & very tender Skin:  Intact without suspicious lesions or rashes. No jaundice. Skin slightly damp Cervical Nodes:  No lymphadenopathy noted Axillary Nodes:  No palpable lymphadenopathy Psych:  memory intact for recent and remote, normally interactive, and good eye contact.     Impression & Recommendations:  Problem # 1:  FEVER (ICD-780.60)  Orders: Venipuncture (66440) Specimen Handling (34742) TLB-CBC Platelet - w/Differential (85025-CBCD)  Problem # 2:  ABDOMINAL PAIN (ICD-789.00)  lower quadrants; reffered rom # 3 vs Diverticulitis ( doubt)  Orders: Venipuncture (59563) Radiology Referral (Radiology) Specimen Handling (87564) TLB-CBC Platelet - w/Differential (85025-CBCD) : [place] of scrotum  Problem # 3:  DIABETES MELLITUS, TYPE II, UNCONTROLLED (ICD-250.02)  His updated medication list for this problem includes:    Metformin Hcl 500 Mg Tb24 (Metformin hcl) .Marland Kitchen... 1 by mouth two times a day    Glimepiride 2 Mg Tabs  (Glimepiride) .Marland Kitchen... 1/2 q am    Lisinopril 20 Mg Tabs (Lisinopril) .Marland Kitchen... 1 once daily  Orders: Venipuncture (33295) Specimen Handling (18841) TLB-Creatinine, Blood (82565-CREA) TLB-Potassium (K+) (84132-K) TLB-BUN (Urea Nitrogen) (84520-BUN) TLB-A1C / Hgb A1C (Glycohemoglobin) (83036-A1C)  Problem # 4:  HYPERTENSION, ESSENTIAL NOS (ICD-401.9)  controlled adequately  His updated medication list for this problem includes:    Metoprolol Succinate 100 Mg Tb24 (Metoprolol succinate) .Marland Kitchen... 1/2 tab qd    Lisinopril 20 Mg Tabs (Lisinopril) .Marland Kitchen... 1 once daily  Orders: Venipuncture (66063) TLB-Creatinine, Blood (82565-CREA) TLB-Potassium (K+) (84132-K) TLB-BUN (Urea Nitrogen) (84520-BUN)  Complete Medication List: 1)  Levoxyl 125 Mcg Tabs (Levothyroxine sodium) .... Take one tablet daily 2)  Metoprolol Succinate 100 Mg Tb24 (Metoprolol succinate) .... 1/2 tab qd 3)  Metformin Hcl 500 Mg Tb24 (Metformin hcl) .Marland Kitchen.. 1 by mouth two times a day 4)  Xanax 0.5 Mg Tabs (Alprazolam) .Marland Kitchen.. 1 by mouth q 8 hours as needed only 5)  Prilosec Otc 20 Mg Tbec (Omeprazole magnesium) .Marland Kitchen.. 1 by mouth qd 6)  Hydrocodone-acetaminophen 10-325 Mg Tabs (  Hydrocodone-acetaminophen) .... Prn 7)  Crestor 10 Mg Tabs (Rosuvastatin calcium) .... 1/2 tab every other day- due for labs now 8)  Folic Acid 1 Mg Tabs (Folic acid) .Marland Kitchen.. 1 by mouth qd 9)  Methotrexate (anti-rheumatic) 2.5 Mg Tabs (Methotrexate (anti-rheumatic)) .... 6 tabs q week 10)  Vitamin D 16109 Unit Caps (Ergocalciferol) .... One cap every other week 11)  Multivitamin With No Iron  .... Qd 12)  Calcium and Vit D 66/400  .... 2 tabs daily 13)  Antihistamines 25mg   .... Prn 14)  Asa 81mg   .... Qd 15)  Testosterone 300mg  Im  .... Q month 16)  Glimepiride 2 Mg Tabs (Glimepiride) .... 1/2 q am 17)  Lisinopril 20 Mg Tabs (Lisinopril) .Marland Kitchen.. 1 once daily 18)  Viagra 100 Mg Tabs (Sildenafil citrate) .... As needed 19)  Vitamin D (ergocalciferol) 50000 Unit Caps  (Ergocalciferol) .Marland Kitchen.. 1 by mouth every other week 20)  Doxycycline Hyclate 100 Mg Tabs (Doxycycline hyclate) .Marland Kitchen.. 1 by mouth once daily 21)  Doxycycline Hyclate 100 Mg Caps (Doxycycline hyclate) .Marland Kitchen.. 1 two times a day ; avoid sun 22)  Freestyle Lancets Misc (Lancets) .... Use as directed bid 23)  Freestyle Lite Test Strp (Glucose blood) .... Use as directed bid  Other Orders: UA Dipstick W/ Micro (manual) (60454) T-Culture, Urine (09811-91478) TLB-PSA (Prostate Specific Antigen) (84153-PSA)  Patient Instructions: 1)  Hold Methrotrexate & Metformin until notified. 2)  Drink as much fluid as you can tolerate for the next few days. 3)  See your eye doctor yearly to check for diabetic eye damage. 4)  Check your feet each night for sore areas, calluses or signs of infection. 5)  Take 650-1000mg  of Tylenol every 4-6 hours as needed for relief of pain or comfort of fever AVOID taking more than 4000mg   in a 24 hour period (can cause liver damage in higher doses). Prescriptions: FREESTYLE LITE TEST  STRP (GLUCOSE BLOOD) use as directed bid  #100 x 0   Entered by:   Lucious Groves CMA   Authorized by:   Marga Melnick MD   Signed by:   Lucious Groves CMA on 08/28/2010   Method used:   Electronically to        [facility] (retail)       [address]       [place], [place]  [address]       Ph: [phone]       Fax: [phone]   RxID:   [id number] FREESTYLE LANCETS  MISC (LANCETS) use as directed bid  #100 x 0   Entered by:   Lucious Groves CMA   Authorized by:   Marga Melnick MD   Signed by:   Lucious Groves CMA on 08/28/2010   Method used:   Electronically to        [facility] (retail)       [address]       [place], [place]  [address]       Ph: [phone]       Fax: [phone]   RxID:   [id number] DOXYCYCLINE HYCLATE 100 MG CAPS (DOXYCYCLINE HYCLATE) 1 two times a day ; avoid sun  #20 x 0   Entered and Authorized by:   Marga Melnick MD   Signed by:   Marga Melnick MD on  08/28/2010   Method used:   Print then Give to Patient   RxID:   [id number] LISINOPRIL 20 MG TABS (LISINOPRIL) 1 once daily  #90 x 1  Entered and Authorized by:   Marga Melnick MD   Signed by:   Marga Melnick MD on 08/28/2010   Method used:   Print then Give to Patient   RxID:   [id number] XANAX 0.5 MG  TABS (ALPRAZOLAM) 1 by mouth q 8 hours as needed only  #90 x 1   Entered and Authorized by:   Marga Melnick MD   Signed by:   Marga Melnick MD on 08/28/2010   Method used:   Print then Give to Patient   RxID:   [id number] METFORMIN HCL 500 MG  TB24 (METFORMIN HCL) 1 by mouth two times a day  #180 x 1   Entered and Authorized by:   Marga Melnick MD   Signed by:   Marga Melnick MD on 08/28/2010   Method used:   Print then Give to Patient   RxID:   [id number]    Orders Added: 1)  UA Dipstick W/ Micro (manual) [81000] 2)  Specimen Handling [99000] 3)  T-Culture, Urine [74259-56387] 4)  Est. Patient Level IV [56433] 5)  Venipuncture [29518] 6)  Radiology Referral [Radiology] 7)  TLB-PSA (Prostate Specific Antigen) [84153-PSA] 8)  Specimen Handling [99000] 9)  TLB-CBC Platelet - w/Differential [85025-CBCD] 10)  TLB-Creatinine, Blood [82565-CREA] 11)  TLB-Potassium (K+) [84132-K] 12)  TLB-BUN (Urea Nitrogen) [84520-BUN] 13)  TLB-A1C / Hgb A1C (Glycohemoglobin) [83036-A1C]    Laboratory Results   Urine Tests    Routine Urinalysis   Color: orange Appearance: Cloudy Glucose: negative   (Normal Range: Negative) Bilirubin: negative   (Normal Range: Negative) Ketone: negative   (Normal Range: Negative) Spec. Gravity: 1.025   (Normal Range: 1.003-1.035) Blood: large   (Normal Range: Negative) pH: 6.0   (Normal Range: 5.0-8.0) Protein: negative   (Normal Range: Negative) Urobilinogen: 0.2   (Normal Range: 0-1) Nitrite: negative   (Normal Range: Negative) Leukocyte Esterace: moderate   (Normal Range: Negative)    Comments: sent for  culture     CC:  1.) Fever, swelling in private area, lower abdominal pain, blood in urine, and refill meds , Abdominal pain, and Type 2 diabetes mellitus follow-up.  History of Present Illness: Abdominal Pain      This is a 75 year old man who presents with Abdominal pain since 08/26/2010. Actually his wife states he had mid back pain 11/25.  The patient reports  some constipation, but denies nausea, vomiting, diarrhea, melena, hematochezia, anorexia, and hematemesis.  The location of the pain is right  &  left lower quadrant  and suprapubic area.  The pain is described as intermittent, cramping in quality, and radiating to the back.  Associated symptoms include fever, intermittent hematuria  and dysuria.  The patient denies the following symptoms: coke colored  urine.  His L testicle swelled 11/26. Fever began 11/27 ; temp up to 101. Rx: none. He is on MTX weekly. Type 2 Diabetes Mellitus Follow-Up      The patient is also here for  Metformin refill &  for Type 2 diabetes mellitus follow-up.  The patient denies polyuria, polydipsia, blurred vision, and numbness of extremities.  The patient denies the following symptoms: neuropathic pain, orthostatic symptoms, poor wound healing, intermittent claudication, vision loss, and foot ulcer.  Since the last visit the patient reports  fair  dietary compliance, not exercising regularly, and not monitoring blood glucose. He requests a new glucometer. The patient reports having had no  DM retinopathy. Hypertension Follow-Up      The patient also presents for  Hypertension follow-up.  The patient denies lightheadedness, headaches, and fatigue.  The patient denies the following associated symptoms: palpitations, syncope, leg edema, and pedal edema.  Compliance with medications (by patient report) has been near 100%.  Adjunctive measures currently  NOT used by the patient include salt restriction.  BP @ home averages 140/68.   Physical Exam  General:   well-nourished,in no acute distress; alert,appropriate and cooperative throughout examination Eyes:  No corneal or conjunctival inflammation noted.No icterus Ears:  Hearing loss hindered history Mouth:  Oral mucosa and oropharynx without lesions or exudates.  Mild  pharyngeal erythema.   Lungs:  Normal respiratory effort, chest expands symmetrically. Lungs are clear to auscultation, no crackles or wheezes. Heart:  Normal rate and regular rhythm. S1 and S2 normal without gallop, murmur, click, rub or other extra sounds. Abdomen:  Bowel sounds positive but slightly decreased,abdomen soft  but  tender RLQ  without masses, organomegaly . Ventral hernia noted. Genitalia:  R testicle normal; L srotum swollen & very tender Skin:  Intact without suspicious lesions or rashes. No jaundice. Skin slightly damp Cervical Nodes:  No lymphadenopathy noted Axillary Nodes:  No palpable lymphadenopathy Psych:  memory intact for recent and remote, normally interactive, and good eye contact.      Impression & Recommendations:  Problem # 1:  FEVER (ICD-780.60)  Orders: Venipuncture (16109) Specimen Handling (60454) TLB-CBC Platelet - w/Differential (85025-CBCD)  Problem # 2:  ABDOMINAL PAIN (ICD-789.00)  lower quadrants; reffered rom # 3 vs Diverticulitis ( doubt)  Orders: Venipuncture (09811) Radiology Referral (Radiology) Specimen Handling (91478) TLB-CBC Platelet - w/Differential (85025-CBCD) : [place] of scrotum  Problem # 3:  DIABETES MELLITUS, TYPE II, UNCONTROLLED (ICD-250.02)  His updated medication list for this problem includes:    Metformin Hcl 500 Mg Tb24 (Metformin hcl) .Marland Kitchen... 1 by mouth two times a day    Glimepiride 2 Mg Tabs (Glimepiride) .Marland Kitchen... 1/2 q am    Lisinopril 20 Mg Tabs (Lisinopril) .Marland Kitchen... 1 once daily  Orders: Venipuncture ([address]) Specimen Handling (13086) TLB-Creatinine, Blood (82565-CREA) TLB-Potassium (K+) (84132-K) TLB-BUN (Urea Nitrogen) (84520-BUN) TLB-A1C / Hgb A1C  (Glycohemoglobin) (83036-A1C)  Problem # 4:  HYPERTENSION, ESSENTIAL NOS (ICD-401.9)  controlled adequately  His updated medication list for this problem includes:    Metoprolol Succinate 100 Mg Tb24 (Metoprolol succinate) .Marland Kitchen... 1/2 tab qd    Lisinopril 20 Mg Tabs (Lisinopril) .Marland Kitchen... 1 once daily  Orders: Venipuncture (57846) TLB-Creatinine, Blood (82565-CREA) TLB-Potassium (K+) (84132-K) TLB-BUN (Urea Nitrogen) (84520-BUN)  Complete Medication List: 1)  Levoxyl 125 Mcg Tabs (Levothyroxine sodium) .... Take one tablet daily 2)  Metoprolol Succinate 100 Mg Tb24 (Metoprolol succinate) .... 1/2 tab qd 3)  Metformin Hcl 500 Mg Tb24 (Metformin hcl) .Marland Kitchen.. 1 by mouth two times a day 4)  Xanax 0.5 Mg Tabs (Alprazolam) .Marland Kitchen.. 1 by mouth q 8 hours as needed only 5)  Prilosec Otc 20 Mg Tbec (Omeprazole magnesium) .Marland Kitchen.. 1 by mouth qd 6)  Hydrocodone-acetaminophen 10-325 Mg Tabs (Hydrocodone-acetaminophen) .... Prn 7)  Crestor 10 Mg Tabs (Rosuvastatin calcium) .... 1/2 tab every other day- due for labs now 8)  Folic Acid 1 Mg Tabs (Folic acid) .Marland Kitchen.. 1 by mouth qd 9)  Methotrexate (anti-rheumatic) 2.5 Mg Tabs (Methotrexate (anti-rheumatic)) .... 6 tabs q week 10)  Vitamin D 96295 Unit Caps (Ergocalciferol) .... One cap every other week 11)  Multivitamin With No Iron  .... Qd 12)  Calcium and Vit D 66/400  .... 2 tabs daily 13)  Antihistamines 25mg   .... Prn  14)  Asa 81mg   .... Qd 15)  Testosterone 300mg  Im  .... Q month 16)  Glimepiride 2 Mg Tabs (Glimepiride) .... 1/2 q am 17)  Lisinopril 20 Mg Tabs (Lisinopril) .Marland Kitchen.. 1 once daily 18)  Viagra 100 Mg Tabs (Sildenafil citrate) .... As needed 19)  Vitamin D (ergocalciferol) 50000 Unit Caps (Ergocalciferol) .Marland Kitchen.. 1 by mouth every other week 20)  Doxycycline Hyclate 100 Mg Tabs (Doxycycline hyclate) .Marland Kitchen.. 1 by mouth once daily 21)  Doxycycline Hyclate 100 Mg Caps (Doxycycline hyclate) .Marland Kitchen.. 1 two times a day ; avoid sun 22)  Freestyle Lancets Misc (Lancets)  .... Use as directed bid 23)  Freestyle Lite Test Strp (Glucose blood) .... Use as directed bid  Other Orders: UA Dipstick W/ Micro (manual) (19147) T-Culture, Urine (82956-21308) TLB-PSA (Prostate Specific Antigen) (84153-PSA)   Review of Systems Resp:  Denies cough and sputum productive. GI:  Denies bloody stools, dark tarry stools, and diarrhea.  Prescriptions: FREESTYLE LITE TEST  STRP (GLUCOSE BLOOD) use as directed bid  #100 x 0   Entered by:   Lucious Groves CMA   Authorized by:   Marga Melnick MD   Signed by:   Lucious Groves CMA on 08/28/2010   Method used:   Electronically to        [facility] (retail)       [address]       [place], [place]  [address]       Ph: [phone]       Fax: [phone]   RxID:   [id number] FREESTYLE LANCETS  MISC (LANCETS) use as directed bid  #100 x 0   Entered by:   Lucious Groves CMA   Authorized by:   Marga Melnick MD   Signed by:   Lucious Groves CMA on 08/28/2010   Method used:   Electronically to        [facility] (retail)       [address]       [place], [place]  [address]       Ph: [phone]       Fax: [phone]   RxID:   [id number] DOXYCYCLINE HYCLATE 100 MG CAPS (DOXYCYCLINE HYCLATE) 1 two times a day ; avoid sun  #20 x 0   Entered and Authorized by:   Marga Melnick MD   Signed by:   Marga Melnick MD on 08/28/2010   Method used:   Print then Give to Patient   RxID:   [id number] LISINOPRIL 20 MG TABS (LISINOPRIL) 1 once daily  #90 x 1   Entered and Authorized by:   Marga Melnick MD   Signed by:   Marga Melnick MD on 08/28/2010   Method used:   Print then Give to Patient   RxID:   [id number] XANAX 0.5 MG  TABS (ALPRAZOLAM) 1 by mouth q 8 hours as needed only  #90 x 1   Entered and Authorized by:   Marga Melnick MD   Signed by:   Marga Melnick MD on 08/28/2010   Method used:   Print then Give to Patient   RxID:   [id number] METFORMIN HCL 500 MG  TB24 (METFORMIN HCL) 1 by mouth two times a  day  #180 x 1   Entered and Authorized by:   Marga Melnick MD   Signed by:   Marga Melnick MD on 08/28/2010   Method used:   Print then Give to Patient   RxID:   [id number]

## 2010-11-02 NOTE — Letter (Signed)
Summary: [facility]  [facility]   Imported By: Lanelle Bal 07/22/2010 10:51:16  _____________________________________________________________________  External Attachment:    Type:   Image     Comment:   External Document

## 2010-11-02 NOTE — Progress Notes (Signed)
Summary: refill  Phone Note Refill Request Message from:  Fax from Pharmacy on [facility] [address] fax [phone]  alprazolam 0.5mg   Initial call taken by: Barb Merino,  October 03, 2009 11:11 AM    Prescriptions: Louis [name] 0.5 MG  TABS (ALPRAZOLAM) 1 by mouth q 8 hours as needed only  #90 x 1   Entered by:   Shonna Chock   Authorized by:   Marga Melnick MD   Signed by:   Shonna Chock on 10/03/2009   Method used:   Printed then faxed to ...       [facility] [address] [facility] (retail)       [address] [place], [place]  [address]       Ph: [phone] or [phone]       Fax: [phone]   RxID:   [id number]

## 2010-11-02 NOTE — Progress Notes (Signed)
Summary: Triage / Abdominal pain w/ bleeding  Phone Note Call from Patient Call back at Home Phone [phone]   Caller: Patient Call For: Dr. Marina Goodell Reason for Call: Talk to Nurse Summary of Call: Pt is having really bad stomach pains since saturday and also having alot of bright red blood  Initial call taken by: [place] Johnson,  October 09, 2010 9:39 AM  Follow-up for Phone Call        Pt. started having lower abd. cramping on Saturday along with b.rrb..Today had  bleeding in toilet and cramping continues.Says he has hx. of twisted bowels and diverticulitis. Given appt. with P.a. for this afternoon.Chart requested and is coming from warehouse. Follow-up by: Teryl Lucy RN,  October 09, 2010 10:34 AM  Additional Follow-up for Phone Call Additional follow up Details #1::        ok Additional Follow-up by: Hilarie Fredrickson MD,  October 09, 2010 10:50 AM

## 2010-11-02 NOTE — Op Note (Signed)
Summary: Lumbar Epidural Steroid Injection/[facility]   Lumbar Epidural Steroid Injection/[facility]   Imported By: Lanelle Bal 08/25/2010 11:42:43  _____________________________________________________________________  External Attachment:    Type:   Image     Comment:   External Document

## 2010-11-02 NOTE — Progress Notes (Signed)
Summary: refill  Phone Note Refill Request Message from:  Fax from Pharmacy on December 20, 2009 11:04 AM  Refills Requested: Medication #1:  CRESTOR 10 MG  TABS 1/2 tab every other day [facility] fax [phone]   Method Requested: Mail to Pharmacy Next Appointment Scheduled: no appt Initial call taken by: Barb Merino,  December 20, 2009 11:05 AM    Prescriptions: CRESTOR 10 MG  TABS (ROSUVASTATIN CALCIUM) 1/2 tab every other day  #23 x 1   Entered by:   Kandice Hams   Authorized by:   Marga Melnick MD   Signed by:   Kandice Hams on 12/20/2009   Method used:   Printed then faxed to ...       [facility] (mail-order)       [address], [address] [place]  [address]       Ph: [phone]       Fax: [phone]   RxID:   [id number]

## 2010-11-02 NOTE — Progress Notes (Signed)
Summary: refill  Phone Note Refill Request Message from:  Fax from Pharmacy on August 16, 2010 1:16 PM  Refills Requested: Medication #1:  CRESTOR 10 MG  TABS 1/2 tab every other day- DUE FOR LABS NOW [facility] - fax [phone]  Initial call taken by: Okey Regal [place],  August 16, 2010 1:17 PM    Prescriptions: CRESTOR 10 MG  TABS (ROSUVASTATIN CALCIUM) 1/2 tab every other day- DUE FOR LABS NOW  #23 x 0   Entered by:   Jeremy Johann CMA   Authorized by:   Marga Melnick MD   Signed by:   Jeremy Johann CMA on 08/16/2010   Method used:   Printed then faxed to ...       [facility] (mail-order)       [address], [place]  [address]       Ph: [phone]       Fax: [phone]   RxID:   [id number]

## 2010-11-02 NOTE — Letter (Signed)
Summary: [facility]   Imported By: Lanelle Bal 10/26/2009 14:35:02  _____________________________________________________________________  External Attachment:    Type:   Image     Comment:   External Document

## 2010-11-02 NOTE — Letter (Signed)
Summary: [facility] Instructions  [facility]  [address] [place], [place] [address]   Phone: [phone]  Fax: [phone]       BENNO BRENSINGER    73-Aug-1937    MRN: [id number]        Procedure Day /Date:10-31-2010     Arrival Time: 2;30 PM      Procedure Time: 3:30 PM     Location of Procedure:                    X     [facility] (4th Floor)                      PREPARATION FOR COLONOSCOPY WITH MOVIPREP   Starting 5 days prior to your procedure 10-26-2010  do not eat nuts, seeds, popcorn, corn, beans, peas,  salads, or any raw vegetables.  Do not take any fiber supplements (e.g. Metamucil, Citrucel, and Benefiber).  THE DAY BEFORE YOUR PROCEDURE         DATE: 10-30-2010 DAY: Monday  1.  Drink clear liquids the entire day-NO SOLID FOOD  2.  Do not drink anything colored red or purple.  Avoid juices with pulp.  No orange juice.  3.  Drink at least 64 oz. (8 glasses) of fluid/clear liquids during the day to prevent dehydration and help the prep work efficiently.  CLEAR LIQUIDS INCLUDE: Water Jello Ice Popsicles Tea (sugar ok, no milk/cream) Powdered fruit flavored drinks Coffee (sugar ok, no milk/cream) Gatorade Juice: apple, white grape, white cranberry  Lemonade Clear bullion, consomm, broth Carbonated beverages (any kind) Strained chicken noodle soup Hard Candy                             4.  In the morning, mix first dose of MoviPrep solution:    Empty 1 Pouch A and 1 Pouch B into the disposable container    Add lukewarm drinking water to the top line of the container. Mix to dissolve    Refrigerate (mixed solution should be used within 24 hrs)  5.  Begin drinking the prep at 5:00 p.m. The MoviPrep container is divided by 4 marks.   Every 15 minutes drink the solution down to the next mark (approximately 8 oz) until the full liter is complete.   6.  Follow completed prep with 16 oz of clear liquid of your choice (Nothing red  or purple).  Continue to drink clear liquids until bedtime.  7.  Before going to bed, mix second dose of MoviPrep solution:    Empty 1 Pouch A and 1 Pouch B into the disposable container    Add lukewarm drinking water to the top line of the container. Mix to dissolve    Refrigerate  THE DAY OF YOUR PROCEDURE      DATE:10-31-2010 DAY: Tuesday  Beginning at 10:30 Am  (5 hours before procedure):         1. Every 15 minutes, drink the solution down to the next mark (approx 8 oz) until the full liter is complete.  2. Follow completed prep with 16 oz. of clear liquid of your choice.    3. You may drink clear liquids until 1: 30 PM  (2 HOURS BEFORE PROCEDURE).   MEDICATION INSTRUCTIONS  Unless otherwise instructed, you should take regular prescription medications with a small sip of water   as early as possible the morning  of your procedure.  Diabetic patients - see separate instructions.       OTHER INSTRUCTIONS  You will need a responsible adult at least 73 years of age to accompany you and drive you home.   This person must remain in the waiting room during your procedure.  Wear loose fitting clothing that is easily removed.  Leave jewelry and other valuables at home.  However, you may wish to bring a book to read or  an iPod/MP3 player to listen to music as you wait for your procedure to start.  Remove all body piercing jewelry and leave at home.  Total time from sign-in until discharge is approximately 2-3 hours.  You should go home directly after your procedure and rest.  You can resume normal activities the  day after your procedure.  The day of your procedure you should not:   Drive   Make legal decisions   Operate machinery   Drink alcohol   Return to work  You will receive specific instructions about eating, activities and medications before you leave.    The above instructions have been reviewed and explained to me by    _______________________    I fully understand and can verbalize these instructions _____________________________ Date _________

## 2010-11-02 NOTE — Letter (Signed)
Summary: Results Follow up Letter  [facility]  [address] [place], [place] [address]   Phone: [phone]  Fax: [phone]    10/17/2009 MRN: [id number]  Louis [name] [address] [place], [place]  [address]  Dear [name],  The following are the results of your recent test(s):  Test         Result    Pap Smear:        Normal _____  Not Normal _____ Comments: ______________________________________________________ Cholesterol: LDL(Bad cholesterol):         Your goal is less than:         HDL (Good cholesterol):       Your goal is more than: Comments:  ______________________________________________________ Mammogram:        Normal _____  Not Normal _____ Comments:  ___________________________________________________________________ Hemoccult:        Normal _____  Not normal _______ Comments:    _____________________________________________________________________ Other Tests: Please see attached labs done on 10/12/09, call to schedule appointment to recheck labs in 6 months    We routinely do not discuss normal results over the telephone.  If you desire a copy of the results, or you have any questions about this information we can discuss them at your next office visit.   Sincerely,

## 2010-11-02 NOTE — Progress Notes (Signed)
Summary: Refill--Glimepiride  Phone Note Refill Request Call back at Home Phone [phone] Message from:  Patient on September 20, 2010 11:38 AM  Refills Requested: Medication #1:  GLIMEPIRIDE 2 MG  TABS 1/2 q am Initial call taken by: Lucious Groves CMA,  September 20, 2010 11:38 AM    Prescriptions: GLIMEPIRIDE 2 MG  TABS (GLIMEPIRIDE) 1/2 q am  #30 Tablet x 5   Entered by:   Lucious Groves CMA   Authorized by:   Marga Melnick MD   Signed by:   Lucious Groves CMA on 09/20/2010   Method used:   Electronically to        [id number] (retail)       [address]       [place], [place]  [address]       Ph: [phone]       Fax: [phone]   RxID:   [id number]

## 2010-11-02 NOTE — Progress Notes (Signed)
Summary: ?s Moviprep question  Phone Note Call from Patient Call back at Home Phone [phone]   Caller: Patient Call For: Dr. Marina Goodell Reason for Call: Talk to Nurse Summary of Call: Pt has some questions about prep and procedure Initial call taken by: [place] Johnson,  October 20, 2010 2:05 PM  Follow-up for Phone Call        The pt called and said his ins co will not cover the Moviprep.  I advised the pt I would call [employer] for him per his request.  I did tell the pt [facility]'s cash price is 68.00  .  [facility]'s cash price is 78.28.  The [facility] it too charges 80.00.  I told him I would call him back. Follow-up by: Joselyn Glassman,  October 20, 2010 2:25 PM  Additional Follow-up for Phone Call Additional follow up Details #1::        Patient still waiting for call back from nurse. Leanor Kail [facility]  October 23, 2010 9:39 AM  Called the pt to advise that [employer] does not want to cover the Moviprep.  I did call [facility] and their cash price for Moviprep is 78.28.  I told him I would be glad to send the perscription  to whichever pharamcy he wants.  He said he will just pick it up at [facility] and he thanked me for all I did for him. Additional Follow-up by: Joselyn Glassman,  October 23, 2010 10:15 AM

## 2010-11-02 NOTE — Progress Notes (Signed)
Summary: Refill Request  Phone Note Refill Request Message from:  Patient  Refills Requested: Medication #1:  LEVOXYL 125 MCG  TABS take one tablet daily [facility] in [place]   Method Requested: Electronic Initial call taken by: Shonna Chock,  October 03, 2009 4:18 PM    Prescriptions: LEVOXYL 125 MCG  TABS (LEVOTHYROXINE SODIUM) take one tablet daily  #30 x 0   Entered by:   Shonna Chock   Authorized by:   Marga Melnick MD   Signed by:   Shonna Chock on 10/03/2009   Method used:   Electronically to        [facility] [address] [id number] (retail)       [address] [place], [place]  [address]       Ph: [phone] or [phone]       Fax: [phone]   RxID:   [id number]

## 2010-11-02 NOTE — Discharge Summary (Signed)
Summary: discharge summary    NAME:  Louis [name], Louis [name]                        ACCOUNT NO.:  [id number]   MEDICAL RECORD NO.:  [id number]                   PATIENT TYPE:  INP   LOCATION:  0472                                 FACILITY:  [facility]   PHYSICIAN:  Judie Petit T. Russella Dar, M.D. [facility]          DATE OF BIRTH:  May 05, 1936   DATE OF ADMISSION:  08/12/2002  DATE OF DISCHARGE:  08/18/2002                                 DISCHARGE SUMMARY   ADMITTING DIAGNOSES:  1. A 75 year old white male with two-day history of acute severe right upper     quadrant abdominal pain with nausea and vomiting, rule out cholecystitis,     rule out bowel obstruction, rule out gastroenteritis.  2. History of hemochromatosis, stable status post previous phlebotomies.  3. History of diverticulosis.  4. Gastroesophageal reflux disease.  5. Adult-onset diabetes mellitus, current diet controlled.  6. Hypertension.  7. Hypothyroidism.  8. Osteoarthritis.  9. Status post remote appendectomy.   DISCHARGE DIAGNOSES:  1. Stable status post small bowel obstruction, etiology presumed secondary     to adhesions with small bowel follow through continuing to suggest     prominent jejunal loops; cannot rule out other etiology.  2. Fever, resolved.  3. Other diagnoses as listed above.   CONSULTATIONS:  None.   PROCEDURES:  1. CT scan of the abdomen and pelvis.  2. Plain abdominal films.  3. Small bowel follow through.  4. Upper abdominal ultrasound.   BRIEF HISTORY:  The patient is a very nice 75 year old white male, primary  patient of Dr. Alwyn Ren, known to Dr. Sheryn Bison for GI.  The patient has  history as described above.  He had been followed by Dr. Jarold Motto for  hemochromatosis and has had stable ferratin levels over the past couple of  years and previously had undergone phlebotomies.  The patient had been  feeling well over the past few months with no abdominal complaints and  normal bowel habits.  He does  have reflux and his symptoms are well  controlled with Prevacid.  He relates over the past two days development of  severe, crampy, right upper quadrant abdominal pain associated with nausea  and vomiting.  He had been unable to keep down any p.o.'s and had not had a  bowel movement in greater than 24 hours.  He had not had any fever or chills  that he was aware of, no darkening of his urine or jaundice.  He presented  to the emergency room due to pain, nausea, and vomiting, was seen and  evaluated by Dr. Arlyce Dice who was covering on call, and admitted for further  diagnostic workup.  He was hemodynamically stable and afebrile on admission  but with significant right upper quadrant tenderness and rebound.   LABORATORY STUDIES:  On admission, wbc 11.6, hemoglobin 14.8, hematocrit  44.3, MCV 82.8, platelets 348.  Serial values were obtained; on November 16  wbc  was 9.5, hemoglobin 12.6, hematocrit 37.1, platelets 302.  Electrolytes  within normal limits on admission:  Glucose 144, BUN 18, creatinine 1.0,  albumin 4.7.  Follow-up on November 14, again electrolytes normal; glucose  121.  Liver function studies entirely normal.  Amylase 50, lipase 27 on  admission.  Urinalysis negative.  EKG on admission:  Normal sinus rhythm.   X-ray studies:  KUB on admission consistent with small bowel obstruction.  CT scan shows a 1.5 cm low attenuation lesion in the right lobe of the liver  consistent with a simple cyst; gallbladder unremarkable, pancreas  unremarkable, no ductal dilation, partial small bowel obstruction at the  approximate level of the mid ileum; transition point appears to be in the  right mid pelvis; no mass identified; sigmoid diverticulosis without  diverticulitis.  Upper abdominal ultrasound on November 12 shows gallbladder  unremarkable, no ductal dilation, no stones.  Plain films on November 14  showed interval improvement in small bowel obstruction with a minimal amount  of air  fluid levels.  Follow-up on November 16 showed minimal small bowel  prominence, no definite obstruction.  Small bowel follow through on November  17 shows no obstruction; however, there does seem to be some dilation of the  jejunal loops which seem to be the point of dilation on the plain films  earlier; consider adhesions.   HOSPITAL COURSE:  The patient was admitted to the service of Dr. Melvia Heaps.  He was placed on IV fluids, given morphine for pain, and Phenergan  for nausea.  He underwent plain films, CT scan of the abdomen and pelvis,  and upper abdominal ultrasound within the first 24 hours for diagnostic  purposes.  He was found to have a small bowel obstruction presumably  secondary to adhesions from remote appendectomy.  NG tube was placed for  decompression and he had large volume output with improvement in his pain.  On November 14 he had some continued decrease in discomfort, did have a  temperature to 100.5.  Abdominal films were showing some improvement and we  started clamping the NG tube which he tolerated without difficulty.  On  November 15 the NG was left clamped and he was started on some clear  liquids.  Again, he tolerated this well and stated that the NG was not  bothering him so we left it in an extra day or so.  He continued to complain  of some soreness in his abdomen.  On November 16 he had a temperature to  101.3 and sweats.  Chest x-ray was done which did show some minimal  atelectasis in the right upper lobe.  We started incentive spirometry.  There was no other infectious source evident.  By November 17 he was  tolerating clear liquids without difficulty and had had several bowel  movements, was afebrile with a temperature of 97.5.  We proceeded with small  bowel follow through via the NG tube with findings as outlined above,  showing no evidence of obstruction but some continued prominent jejunal  loops.  His NG was then discontinued, we advanced his  diet which he tolerated without difficulty, and on November 18 he was allowed discharge to  home in a stable and improved condition.   DISPOSITION:  To follow up with Dr. Yancey Flemings on December 3 at 9:30 a.m.  The patient had requested follow up with Dr. Marina Goodell as he had seen him  several times in the hospital and had not seen Dr.  Jarold Motto for some time.  He was asked to call for any problems in the interim.   DIET ON DISCHARGE:  Regular.   ACTIVITY:  As tolerated.   MEDICATIONS:  All medications as previous:  1. Synthroid 0.125 q.d.  2. Xanax 05 p.r.n.  3. Prevacid 30 q.d.  4. Aspirin 81 mg q.d.  5. Diltiazem 240 q.d.  6. Tylenol P.M. on a p.r.n. basis.    FOLLOW-UP:  Depending on his course we felt that it would be reasonable to  do a capsule endoscopy as an outpatient to exclude any other subtle  abnormality accounting for his small bowel obstruction and Dr. Marina Goodell will  discuss this with the patient further on follow-up.     Amy Esterwood, P.A.-C. [facility]                Malcolm T. Russella Dar, M.D. [facility]    AE/MEDQ  D:  08/24/2002  T:  08/24/2002  Job:  [id number]   cc:   Titus Dubin. Alwyn Ren, M.D. [facility]

## 2010-11-02 NOTE — Discharge Summary (Signed)
Summary: discharge summary    NAME:  Louis [name], Louis [name]                        ACCOUNT NO.:  [id number]   MEDICAL RECORD NO.:  [id number]                   PATIENT TYPE:  INP   LOCATION:  0455                                 FACILITY:  [facility]   PHYSICIAN:  Rene Paci, M.D. [facility]          DATE OF BIRTH:  1936/03/18   DATE OF ADMISSION:  01/31/2003  DATE OF DISCHARGE:  02/03/2003                                 DISCHARGE SUMMARY   DISCHARGE DIAGNOSES:  1. Nausea and vomiting secondary to recurrent partial small-bowel     obstruction, resolved.  2. Hypertension.  3. Type 2 diabetes.  4. Hypothyroidism.  5. Itching secondary to presumed allergic reaction to DEMEROL versus     INTRAVENOUS CONTRAST.   DISCHARGE MEDICATIONS:  Discharge medications are as prior to admission and  include Synthroid, Prevacid, and Cardizem CD (doses uncertain by the patient  and not checked with outpatient records).   CONDITION ON DISCHARGE:  Medically stable and improved, tolerating regular  diet without difficulty.   CONSULTATIONS:  Consults include Dr. Yancey Flemings of [facility].   PROCEDURES:  Procedures include a CT of the abdomen and pelvis on 02/02/2003  showing top-normal to minimally dilated loops of small bowel within pelvis  with minimal amount of fluid, question mild partial small-bowel obstruction,  otherwise negative study.  Followup KUB on 02/03/2003 showed complete  resolution of this partial small-bowel obstruction.   HOSPITAL FOLLOWUP:  Hospital followup will be with the patient's primary  care physician, Dr. Lona Kettle on an as-needed basis and with Dr. Yancey Flemings of GI on an as-needed basis.   HOSPITAL COURSE:  Problem 1. RECURRENT PARTIAL SMALL-BOWEL OBSTRUCTION.  The  patient is a pleasant 75 year old gentleman who presented to the emergency  room day of admission secondary to 24 hours of nausea, vomiting, abdominal  pain, and occasional diarrhea.  He had had an  episode of small-bowel  obstruction in 2003 and felt that these symptoms were similar to that  presentation.  On physical exam and evaluation in the emergency room the  patient did have decreased bowel sounds and was admitted for supportive  treatment of his recurrent small-bowel obstruction.  During his previous  workup in 2003 it was felt that the obstruction was due to adhesions within  his abdomen.  Discussion was made regarding consulting surgery but GI felt  as long as the patient had 0-3 episodes of recurrent obstruction annually  that it was better to continue medical management.  The patient strongly  agreed with this.  The patient was kept n.p.o. during this hospitalization  until his symptoms had resolved, within 48 hours he was tolerating liquids  without difficulty and by the day of discharge was tolerating solid regular  diet without symptoms.   Problem 2. OTHER MEDICAL ISSUES.  The patient's hypothyroidism and  hypertension were well controlled during this hospitalization.  It should be  noted the  patient was uncertain of his home dose medications and because of  the weekend schedule it was difficult to obtain records from an outpatient  basis.  He was treated with average doses of Synthroid 100 mcg daily and  Cardizem during his hospitalization and his blood pressure remained well  controlled.  He is to resume his usual home medication doses upon return and  discharge.   Problem 3. ALLERGIC REACTION.  On the patient's second day of  hospitalization he developed hives and itching along his arms and back.  He  reported having developed an allergy to [facility] in the past year which he  self-treated with Benadryl.  Reviewing his potential contacts for allergic  reaction it was felt that DEMEROL and/or IV CONTRAST given for the CT were  the most likely triggers for his allergic reaction.  Demerol was changed to  Vicodin and the patient was obviously given no further IV  contrast during  this hospitalization.  The patient's itching resolved in 24 hours and was  controlled in the meanwhile with Benadryl p.r.n.  The patient is encouraged  to use other forms of narcotic medication in the future besides DEMEROL  should the need arise for them.                                               Rene Paci, M.D. [facility]    VL/MEDQ  D:  02/03/2003  T:  02/03/2003  Job:  [id number]

## 2010-11-03 NOTE — Letter (Signed)
Summary: [facility]   Imported By: Lanelle Bal 06/06/2010 11:03:38  _____________________________________________________________________  External Attachment:    Type:   Image     Comment:   External Document

## 2010-11-07 ENCOUNTER — Other Ambulatory Visit: Payer: Self-pay | Admitting: Internal Medicine

## 2010-11-07 ENCOUNTER — Encounter: Payer: Self-pay | Admitting: Internal Medicine

## 2010-11-07 ENCOUNTER — Other Ambulatory Visit (INDEPENDENT_AMBULATORY_CARE_PROVIDER_SITE_OTHER): Payer: Medicare Other

## 2010-11-07 ENCOUNTER — Encounter (INDEPENDENT_AMBULATORY_CARE_PROVIDER_SITE_OTHER): Payer: Self-pay | Admitting: *Deleted

## 2010-11-07 DIAGNOSIS — E785 Hyperlipidemia, unspecified: Secondary | ICD-10-CM

## 2010-11-07 DIAGNOSIS — T887XXA Unspecified adverse effect of drug or medicament, initial encounter: Secondary | ICD-10-CM

## 2010-11-07 LAB — LIPID PANEL
Cholesterol: 204 mg/dL — ABNORMAL HIGH (ref 0–200)
HDL: 39.5 mg/dL (ref >39.00)

## 2010-11-07 LAB — HEPATIC FUNCTION PANEL
ALT: 16 U/L (ref 0–53)
Total Bilirubin: 0.2 mg/dL — ABNORMAL LOW (ref 0.3–1.2)
Total Protein: 6.8 g/dL (ref 6.0–8.3)

## 2010-11-07 LAB — LDL CHOLESTEROL, DIRECT: Direct LDL: 116.5 mg/dL

## 2010-11-08 NOTE — Progress Notes (Signed)
Summary: Refill Request  Phone Note Refill Request Message from:  Pharmacy  Refills Requested: Medication #1:  CRESTOR 10 MG  TABS 1/2 tab every other day- DUE FOR LABS NOW [place] [phone]   Method Requested: Fax to Mail Away Pharmacy Initial call taken by: Shonna Chock CMA,  November 01, 2010 11:45 AM  Follow-up for Phone Call        Left message on voicemail informing patient he is due for fasting labs   Lipid/Hep 272.4/995.20 Follow-up by: Shonna Chock CMA,  November 01, 2010 11:49 AM  Additional Follow-up for Phone Call Additional follow up Details #1::        RX was faxed to this number: [phone] (Number that was on paper fax received)  Additional Follow-up by: Shonna Chock CMA,  November 01, 2010 11:51 AM    Prescriptions: CRESTOR 10 MG TABS (ROSUVASTATIN CALCIUM) 1 by mouth once daily  #23 x 0   Entered by:   Shonna Chock CMA   Authorized by:   Marga Melnick MD   Signed by:   Shonna Chock CMA on 11/01/2010   Method used:   Printed then faxed to ...       [facility] (mail-order)       [address], [address] [place], [place]  [address]       Ph: [phone]       Fax: [phone]   RxID:   [id number]

## 2010-11-08 NOTE — Progress Notes (Signed)
Summary: RX issue--Hydrocodone  Phone Note Refill Request Message from:  Patient on October 31, 2010 8:49 AM  Refills Requested: Medication #1:  HYDROCODONE-ACETAMINOPHEN 10-325 MG  TABS 1 every 6 hrs prn Patient called about the prescription above and notes that he has never been given this med by Dr. Alwyn Ren. It comes from Dr. Ethelene Hal. Patient was advised that it has been filled so we cannot do anything about it now, but it appears that his mail order company requested it from ([place] the wrong doctor. Patient will callt he company and have it cleared up, he understands this is a controlled med and he does not want MD's or insurance thinking he is abusing the med.   Initial call taken by: Lucious Groves CMA,  October 31, 2010 8:49 AM

## 2010-11-08 NOTE — Procedures (Addendum)
Summary: Colonoscopy  Patient: Louis [name] Note: All result statuses are Final unless otherwise noted.  Tests: (1) Colonoscopy (COL)   COL Colonoscopy           DONE     [facility]     [address]     [place], [place]  [address]           COLONOSCOPY PROCEDURE REPORT           PATIENT:  Louis, [name]  MR#:  [id number]     BIRTHDATE:  05/22/1936, 75 yrs. old  GENDER:  male     ENDOSCOPIST:  Wilhemina Bonito. Eda Keys, MD     REF. BY:  Office     PROCEDURE DATE:  10/31/2010     PROCEDURE:  Colonoscopy with snare polypectomy x 2     ASA CLASS:  Class II     INDICATIONS:  history of pre-cancerous (adenomatous) colon polyps,     surveillance and high-risk screening, Abnormal CT of abdomen ;     index exam 03-15-2004 w/ small TAs. Recently seen 10-10-10 for acute     abdominal pain and bleeding w/ CT revealing segmental left sided     colitis. Now better     MEDICATIONS:   Fentanyl 100 mcg IV, Versed 12 mg, Benadryl 25 mg     IV           DESCRIPTION OF PROCEDURE:   After the risks benefits and     alternatives of the procedure were thoroughly explained, informed     consent was obtained.  Digital rectal exam was performed and     revealed no abnormalities.   The LB CF-H180AL [id number] endoscope     was introduced through the anus and advanced to the cecum, which     was identified by both the appendix and ileocecal valve, without     limitations.Time to cecum = 8:33 min. The quality of the prep was     excellent, using MoviPrep.  The instrument was then slowly     withdrawn (time = 11:45 min) as the colon was fully examined.     <<PROCEDUREIMAGES>>           FINDINGS:  A diminutive polyp was found in the ascending colon.     Polyp was snared without cautery. Retrieval was successful. A     diminutive polyp was found in the transverse colon. Polyp was     snared without cautery. Retrieval was successful.  Severe     diverticulosis with stenosis in the sigmoid colon.   Otherwise     normal colonoscopy without other polyps, masses, vascular     ectasias, or inflammatory changes.   Retroflexed views in the     rectum revealed internal hemorrhoids.    The scope was then     withdrawn from the patient and the procedure completed.           COMPLICATIONS:  None ENDOSCOPIC IMPRESSION:     1) Diminutive polyp in the ascending colon - removed     2) Diminutive polyp in the transverse colon - removed     3) Severe diverticulosis in the sigmoid colon with stenosis     4) Otherwise nl colonoscopy     5) Internal hemorrhoids     RECOMMENDATIONS:     1) Follow up colonoscopy in 5 years ( Propofol and Peds scope)     ______________________________     Wilhemina Bonito. Marina Goodell  Montez Hageman, MD           CC:  Pecola Lawless, MD; The Patient           n.     eSIGNED:   Wilhemina Bonito. Eda Keys at 10/31/2010 04:38 PM           Carole Binning, [id number]  Note: An exclamation mark (!) indicates a result that was not dispersed into the flowsheet. Document Creation Date: 10/31/2010 4:39 PM _______________________________________________________________________  (1) Order result status: Final Collection or observation date-time: 10/31/2010 16:29 Requested date-time:  Receipt date-time:  Reported date-time:  Referring Physician:   Ordering Physician: Fransico Setters [phone]) Specimen Source:  Source: Launa Grill Order Number: [id number] Lab site:   Appended Document: Colonoscopy recall     Procedures Next Due Date:    Colonoscopy: 10/2015

## 2010-11-16 NOTE — Letter (Signed)
Summary: Patient Notice- Polyp Results  [facility]  [address] [place], [place] [address]   Phone: [phone]  Fax: [phone]        November 07, 2010 MRN: [id number]    Louis [name] [address] [place], [place]  [address]    Dear [name],  I am pleased to inform you that the colon polyp(s) removed during your recent colonoscopy was (were) found to be benign (no cancer detected) upon pathologic examination.  I recommend you have a repeat colonoscopy examination in 5 years to look for recurrent polyps, as having colon polyps increases your risk for having recurrent polyps or even colon cancer in the future.  Should you develop new or worsening symptoms of abdominal pain, bowel habit changes or bleeding from the rectum or bowels, please schedule an evaluation with either your primary care physician or with me.  Additional information/recommendations:  __ No further action with gastroenterology is needed at this time. Please      follow-up with your primary care physician for your other healthcare      needs.  Please call us if you are having persistent problems or have questions about your condition that have not been fully answered at this time.  Sincerely,  Hilarie Fredrickson MD  This letter has been electronically signed by your physician.  Appended Document: Patient Notice- Polyp Results LETTER MAILED

## 2011-01-03 LAB — URINALYSIS, ROUTINE W REFLEX MICROSCOPIC
Bilirubin Urine: NEGATIVE
Hgb urine dipstick: NEGATIVE
Nitrite: NEGATIVE
Protein, ur: NEGATIVE mg/dL
Urobilinogen, UA: 0.2 mg/dL (ref 0.0–1.0)

## 2011-01-03 LAB — COMPREHENSIVE METABOLIC PANEL
AST: 27 U/L (ref 0–37)
CO2: 30 mEq/L (ref 19–32)
Calcium: 8.8 mg/dL (ref 8.4–10.5)
Chloride: 103 mEq/L (ref 96–112)
Creatinine, Ser: 1.11 mg/dL (ref 0.4–1.5)
GFR calc Af Amer: >60 mL/min (ref >60)
GFR calc non Af Amer: >60 mL/min (ref >60)
Glucose, Bld: 130 mg/dL — ABNORMAL HIGH (ref 70–99)
Potassium: 4.4 mEq/L (ref 3.5–5.1)
Sodium: 138 mEq/L (ref 135–145)

## 2011-01-03 LAB — GLUCOSE, CAPILLARY
Glucose-Capillary: 111 mg/dL — ABNORMAL HIGH (ref 70–99)
Glucose-Capillary: 154 mg/dL — ABNORMAL HIGH (ref 70–99)
Glucose-Capillary: 84 mg/dL (ref 70–99)
Glucose-Capillary: 87 mg/dL (ref 70–99)

## 2011-01-03 LAB — CBC
HCT: 33.1 % — ABNORMAL LOW (ref 39.0–52.0)
Hemoglobin: 10 g/dL — ABNORMAL LOW (ref 13.0–17.0)
MCHC: 33.7 g/dL (ref 30.0–36.0)
MCHC: 33.8 g/dL (ref 30.0–36.0)
MCHC: 34.1 g/dL (ref 30.0–36.0)
MCV: 93.2 fL (ref 78.0–100.0)
Platelets: 287 10*3/uL (ref 150–400)
Platelets: 288 10*3/uL (ref 150–400)
Platelets: 328 10*3/uL (ref 150–400)
RDW: 13.3 % (ref 11.5–15.5)
RDW: 13.6 % (ref 11.5–15.5)
RDW: 13.7 % (ref 11.5–15.5)
RDW: 14 % (ref 11.5–15.5)
WBC: 9.1 10*3/uL (ref 4.0–10.5)

## 2011-01-03 LAB — BASIC METABOLIC PANEL
BUN: 7 mg/dL (ref 6–23)
CO2: 28 mEq/L (ref 19–32)
CO2: 29 mEq/L (ref 19–32)
Calcium: 7.7 mg/dL — ABNORMAL LOW (ref 8.4–10.5)
Chloride: 104 mEq/L (ref 96–112)
Creatinine, Ser: 0.98 mg/dL (ref 0.4–1.5)
Glucose, Bld: 121 mg/dL — ABNORMAL HIGH (ref 70–99)
Glucose, Bld: 157 mg/dL — ABNORMAL HIGH (ref 70–99)
Potassium: 3.9 mEq/L (ref 3.5–5.1)

## 2011-01-03 LAB — ABO/RH: ABO/RH(D): B NEG

## 2011-01-03 LAB — PROTIME-INR
INR: 0.92 (ref 0.00–1.49)
INR: 1.12 (ref 0.00–1.49)
INR: 1.74 — ABNORMAL HIGH (ref 0.00–1.49)
Prothrombin Time: 12.3 seconds (ref 11.6–15.2)
Prothrombin Time: 14.3 seconds (ref 11.6–15.2)

## 2011-01-03 LAB — APTT: aPTT: 25 seconds (ref 24–37)

## 2011-01-16 ENCOUNTER — Other Ambulatory Visit: Payer: Self-pay | Admitting: Internal Medicine

## 2011-02-13 NOTE — Letter (Signed)
Feb 20, 2007    Lemmie Evens, M.D.  [address]  [place], [place] [address]   RE:  Louis [name], Louis [name]  MRN:  [id number]  /  DOB:  1936-03-24   Dear [name]:   Thank you for sharing the lab results and clinical data on Mr. Flaum.  He states that his arthritic condition is improved.  He is able to walk  a mile in 16 minutes, limited only by his knee.  With this exercise, he  has no chest pain, palpitations, or shortness of breath.   He is on a weaning course of prednisone, presently on 3 mg a day.  He  denies polyuria or polydipsia, but does have polyphagia.  He has had no  new paresthesias or non-healing skin lesions.   He is on no specific diet.   His last A1c was 6.5 in December of 2007.  This would be associated with  an average sugar of 153, a 30% increased cardiovascular risk.   Dr. Marcine Matar is addressing the testosterone level of 111.  There has been apparent mild improvement of the vitamin D levels and his  vitamin D 50,000 IU is now every 2 weeks.  He states that he is also on  methotrexate and will have labs done May 23; I presume that will be  hepatic function & CBC.   EXAM:  He does have arteriolar narrowing.  He is followed by Dr.  Ashley Royalty at [facility].  He has a grade 1/2 systolic murmur.  Rhythm is regular.  He has mild rales, but no increased work with breathing.  There are no  organomegaly or masses. Abdomen is protuberant but nontender.  Pedal pulses are intact.  The thyroid is substernal and not palpable.  Skin and nail health is excellent.  There is obvious asymmetry of the knees with  left fusiformly  enlargemed.   I will recommend that Jovonta have an A1c repeated along with fasting  lipids,TSH,& BUN,creat & K+ on May 23.  He was given a prescription note  to facilitate this.   I would recommend that he restrict carbs;an excellent reference would be  [employer], The Flat Belly Diet.  I will give him 2 form  letters,  which will address the metabolic syndrome and the A1c.  A TSH will also  be drawn to assess response to the present does of Levoxyl.    Sincerely,      Titus Dubin. Alwyn Ren, MD,FACP,FCCP  Electronically Signed    [facility]/Med[facility]  DD: 02/20/2007  DT: 02/20/2007  Job #: [id number]   CC:    Bertram Millard. Dahlstedt, M.D.

## 2011-02-13 NOTE — H&P (Signed)
NAME:  Louis [name], Louis [name]           ACCOUNT NO.:  [id number]   MEDICAL RECORD NO.:  [id number]           PATIENT TYPE:   LOCATION:                                 FACILITY:   PHYSICIAN:  Almedia Balls. Ranell Patrick, M.D. DATE OF BIRTH:  1936-08-20   DATE OF ADMISSION:  DATE OF DISCHARGE:                              HISTORY & PHYSICAL   CHIEF COMPLAINT:  Left shoulder pain.   HISTORY OF PRESENT ILLNESS:  The patient is a 75 year old male who  sustained a ground-level fall one week ago injuring his left shoulder  with his arm extended.  The patient was diagnosed with a left proximal  humerus fracture, seen in the emergency department in [place] and also  evaluated by orthopedics in [place] and recommended to have a  hemiarthroplasty with an orthopedist he felt comfortable with.  The  patient came to our office today with severe edema and ecchymosis to  that left upper extremity status post this proximal humerus fracture.  We did discuss surgical  management.  The patient has elected to undergo  surgical management for this left proximal humerus fracture.   ALLERGIES:  THE PATIENT'S ONLY ALLERGY IS SHELLFISH.   CURRENT MEDICATIONS:  1. Metformin 500 mg one tablet p.o. b.i.d.  2. Toprol XL 100 mg one-half tablet daily.  3. Levoxyl 125 mcg one tablet daily.  4. Alprazolam 0.5 mg b.i.d. p.r.n.  5. Prilosec 20 mg p.o. daily.  6. Aspirin 81 mg p.o. daily.  7. Multivitamin.  8. Crestor 10 mg one-half tablet every other day.  9. Antihistamine 25 mg p.r.n.  10.Calcium two daily 600 mg.  11.Folic acid 1 mg one tablet daily.  12.Methotrexate 2.5 mg four tablets once a week.  13.Testosterone 300 mg injected IM.  14.Vitamin D 50 units every other week.   PAST MEDICAL HISTORY:  1. Hypertension.  2. GERD.  3. Diabetes.   FAMILY MEDICAL HISTORY:  Negative.   SOCIAL HISTORY:  Patient of Dr. Marga Melnick.  Does not smoke or use  alcohol and is retired.   PHYSICAL EXAMINATION:  Pulse 80,  respirations 16.  The patient is a  healthy-appearing 75 year old male in no acute distress, pleasant mood  and affect, alert and oriented x3.  Examination of the head and neck  shows full range of motion.  Cranial nerves II through XII grossly  intact.  No gross deformity.  CHEST:  Shows active breath sounds bilaterally with no wheezes, rhonchi  or rales.  HEART:  Regular rate and rhythm.  No murmur.  EXTREMITIES:  Left upper extremity with severe edema and ecchymosis  extending from the shoulder down to the hand.  Neurovascular is intact  distally and capillary refill is less than 2 seconds.   Radiographs show a left proximal humerus fracture with four-part  displacement.   IMPRESSION:  Left proximal humerus fracture with four-part displacement.   PLAN:  Left should hemiarthroplasty by Dr. Malon Kindle later today.      Thomas B. Durwin Nora, P.A.      Almedia Balls. Ranell Patrick, M.D.  Electronically Signed    TBD/MEDQ  D:  05/27/2007  T:  05/27/2007  Job:  [id number]

## 2011-02-13 NOTE — Discharge Summary (Signed)
NAME:  Louis [name], Louis [name]              ACCOUNT NO.:  [id number]   MEDICAL RECORD NO.:  [id number]          PATIENT TYPE:  INP   LOCATION:  5033                         FACILITY:  [facility]   PHYSICIAN:  Almedia Balls. Ranell Patrick, M.D. DATE OF BIRTH:  09-20-1936   DATE OF ADMISSION:  05/27/2007  DATE OF DISCHARGE:                               DISCHARGE SUMMARY   ANTICIPATED DATE OF DISCHARGE:  May 30, 2007.   ADMISSION DIAGNOSIS:  Left proximal humerus fracture with displacement.   DISCHARGE DIAGNOSIS:  Left proximal humerus fracture with displacement  status post hemiarthroplasty.   BRIEF HISTORY:  The patient is a 75 year old male who presented to our  office status post a fall a week earlier.  The patient was noted on x-  ray to have a severely displaced and comminuted proximal humeral  fracture of the left shoulder.  The patient had all options discussed  with him and elected to have a left shoulder hemiarthroplasty by Dr.  Malon Kindle   PROCEDURE:  The patient had a left shoulder hemiarthroplasty by Dr.  Malon Kindle on May 27, 2007.  The assistant was [employer].  The estimated blood loss was 200 mL.  General anesthesia was used  and there were no complications.   HOSPITAL COURSE:  The patient was admitted on May 27, 2007 status  post a fall a week earlier.  The patient underwent the above-stated  procedure and after adequate time in the post anesthesia care unit he  was transferred up to 5000.  The patient did complain about moderate  pain during recovery in anesthesia and his pain was closely watched over  the next 24-48 hours.   On postop day one the patient only complained about mild pain at that  point.  Neurovascularly he was  intact.  His dressing was kept in place  and his sling was kept in place.   On postop day two the patient complained about moderate pain to that  left shoulder and requested another day in the hospital secondary to  lack pain  control.  Currently he is able to participate with some gentle  range of motion.   On exam today he is neurovascularly intact.  His dressing was changed.  There were no signs of erythema, drainage or infection.   DISCHARGE/PLAN:  The patient will be discharged home on May 30, 2007.   DIET:  The patient's diet is regular.   DISCHARGE CONDITION:  The patient's condition is stable.   ALLERGIES:  The patient has no known allergies.   DISCHARGE MEDICATIONS:  1. Aspirin 81 mg by mouth every day.  2. Vitamin D 1 capsule by mouth daily.  3. Folic acid 1 mg by mouth daily.  4. Dilaudid 2 mg 1 tablet by mouth every 4-6 hours as needed pain.  5. Levothyroxine 125 mcg by mouth daily.  6. Glucophage 500 mg by mouth twice a day.  7. Robaxin 500 mg by mouth every 6 hours.  8. Protonix 40 mg by mouth daily.  9. Zocor 20 mg by mouth daily.  10.Testosterone 300 mg  IM once a month.  11.Xanax 0.5 mg by mouth twice a day as needed.   FOLLOW UP:  The patient is to follow back up with Dr. Malon Kindle in 2  weeks.   ACTIVITIES:  Nonweightbearing with the left upper extremity.   WOUND CARE:  The patient is to keep the area clean and dry.      Thomas B. Durwin Nora, P.A.      Almedia Balls. Ranell Patrick, M.D.  Electronically Signed    TBD/MEDQ  D:  05/29/2007  T:  05/30/2007  Job:  [id number]

## 2011-02-13 NOTE — Op Note (Signed)
NAME:  Louis [name], Louis [name]              ACCOUNT NO.:  [id number]   MEDICAL RECORD NO.:  [id number]          PATIENT TYPE:  INP   LOCATION:  5033                         FACILITY:  [facility]   PHYSICIAN:  Almedia Balls. Ranell Patrick, M.D. DATE OF BIRTH:  [birth year]   DATE OF PROCEDURE:  05/27/2007  DATE OF DISCHARGE:                               OPERATIVE REPORT   PREOPERATIVE DIAGNOSIS:  Left shoulder four-part displaced proximal  humerus fracture.   POSTOPERATIVE DIAGNOSIS:  Left shoulder four-part displaced proximal  humerus fracture.   PROCEDURE PERFORMED:  Left shoulder hemiarthroplasty using De[employer]uy [employer]  FX system.   ATTENDING SURGEON:  Almedia Balls. Ranell Patrick, M.D.   ASSISTANT:  Donnie Coffin. Durwin Nora, P.A.   General anesthesia was used.   ESTIMATED BLOOD LOSS:  200 mL.   FLUID REPLACEMENT:  1,500 mL crystalloid.   INSTRUMENT COUNTS:  Correct.   No complications.   Perioperative antibiotics were given.   INDICATION:  The patient is a 75 year old male with a history of a fall  onto the left arm.  The patient's injury occurred in [place] about  approximately 7 days ago.  The patient sustained a displaced proximal  humerus fracture.  The patient was transported back to [facility] and  presented to orthopedics today with a displaced 4-part proximal humerus  fracture with a dislocated humeral head.  I discussed with the patient  and his family the options for treatment.  I recommended arthroplasty  for the shoulder as  reliable means to reestablish proximal humeral  anatomy.  Informed consent was obtained.   DESCRIPTION OF PROCEDURE:  After an adequate level of anesthesia was  achieved, the patient was positioned in the modified beach-chair  position.  All neurovascular structures were padded appropriately.  The  left shoulder was sterilely prepped and draped in the usual manner.  We  approached the shoulder through a deltopectoral approach starting with  the incision near the coracoid process,  extending down to the anterior  humeral insertion of the deltoid.  Dissection was carried sharply down  through subcutaneous tissues using Bovie electrocautery, identified the  cephalic vein, took it laterally with the deltoid.  The pectoralis was  retracted medially, as was the conjoint tendon.  We identified the  fractured humerus, identified the lesser tuberosity, dissected that away  from surrounding tissues in the inferior portion of the coracoid, placed  two #2 FiberWire sutures in a modified W stitch medial to the lesser  tuberosity with the sutures coming out over the top of the lesser.   At this point, I identified the greater tuberosity, freed that up.  Unfortunately, the patient had a significant rotator cuff tear, where  longitudinal splits running in line with the actual tendon fibers.  We  were able to do a rotator cuff repair incorporating probably 3 or 4  tendon strips from the rotator cuff together as one with the attached  fragments of the greater tuberosity.  Again, the greater was not in one  piece but was in multiple pieces.  We did remove the humeral head, which  was dislocated into the axillary area.  We  got that out without  difficulty, sized to a size 48 humeral head, which fit nicely in the  socket and seemed appropriate for the size of the glenoid.  We did go  ahead and tenotomize the biceps tendon, which was frayed anyway, marking  the biceps groove on the humeral shaft.  We then prepared the humeral  shaft with sequential reaming up to a size 12 and then went ahead and  placed a 12 trial stem in place with the 48 x 21 head, which gave [place]  excellent fill in the shoulder.  We could translate the shoulder 50% of  the diameter of the glenoid and 50% inferiorly with 3 laser marks  showing on the humeral prosthesis.  We went and removed the trial  prosthesis, thoroughly irrigated the humerus and the shoulder, removed  excess posterior bone fragments from the joint.   Again, we had great  purchase in the lesser and the greater tuberosity.  We then went ahead  and placed drill holes in the shaft and two #2 FiberWire sutures with a  total of four strands coming out through the shaft on the anterior  aspect of the shaft.  We then placed and around-the-world stitch through  the medial fin of the implant.  We then cemented using DePuy 1 cemented  the prosthesis into place with the 3 laser marks showing with the  anterior fin adjacent to the biceps groove.  Once this was allowed to  harden, we placed a 48 x 21 head and impacted it into place.  We reduced  the shoulder.  We were able to easily reduce the greater and lesser  tuberosities back into place anatomically.  Again, we had done a rotator  cuff repair on the supraspinatus, infraspinatus and teres minor to  create a single tuberosity out of multiple pieces and, although this was  not ideal, we felt like this was acceptable.  We placed the around-the-  world suture lateral to the greater tuberosity and medial to the lesser  tuberosity utilizing a free suture.  We then bone grafted the proximal  humerus anteriorly to encourage tuberosity to tuberosity healing.  We  then reapproximated greater and lesser tuberosities, which were right at  that anterior fin and tied those sutures together, and then we went and  placed using a free needle, placed the sutures from the shaft up through  the subscapularis and through the rotator cuff gaining good tendon  purchase medial to the lesser tuberosity and lateral to the greater  tuberosity and tying that down and incorporating the biceps through the  biceps tenodesis through those sutures.  Then we finally tied the around-  the-world stitch and had nice low profile repair with everything moving  as a unit as I ranged the shoulder.  The CL ligament was in good shape  and the shoulder was nice and stable.  At this point, I thoroughly  irrigated the wound, closed the  deltopectoral interval with 0 Vicryl  followed by 2-0 Vicryl subcutaneous closure and 4-0 Monocryl for skin.  Steri-Strips applied followed by a sterile dressing.  The patient  tolerated the surgery well.      Almedia Balls. Ranell Patrick, M.D.  Electronically Signed     SRN/MEDQ  D:  05/27/2007  T:  05/28/2007  Job:  [id number]

## 2011-02-14 ENCOUNTER — Other Ambulatory Visit: Payer: Self-pay | Admitting: Orthopedic Surgery

## 2011-02-14 DIAGNOSIS — M25512 Pain in left shoulder: Secondary | ICD-10-CM

## 2011-02-16 ENCOUNTER — Other Ambulatory Visit: Payer: Self-pay | Admitting: Internal Medicine

## 2011-02-16 NOTE — Letter (Signed)
September 26, 2006    Lemmie Evens, M.D.  [address]  [place], [place] [address]   RE:  Louis [name], Louis [name]  MRN:  [id number]  /  DOB:  [birth year]   Dear Louis [name]:   Thank you for copies of your records and labs concerning Louis [name].  You  did find a low testosterone level.  He does have some erectile  dysfunction, for which Viagra is a benefit.  He denied any decreased  libido or decrease in overall physical strength.  I will pursue a  urology consultation concerning the appropriateness of any testosterone  replacement.   He is presently on a course of steroids.  Unfortunately, his hemoglobin  A1C has increased to 6.5, which would be compatible with an average  sugar of 153, and a 30% increased risk of a premature stroke or heart  attack.  I will recommend that he increase his metformin to 500 mg twice  a day with repeat A1C in approximately 8 weeks.  I would encourage him  to avoid the white carbohydrates, which are hyperglycemic, and all foods  and drinks with high fructose corn syrup as the first, second or third  ingredient.   As he is on a taper, I will not make any other changes in his diabetes  management.  Thank you for your evaluation and recommendations.    Sincerely,      Louis Dubin. Alwyn Ren, MD,FACP,FCCP  Electronically Signed   [facility]/MedQ  DD: 09/26/2006  DT: 09/26/2006  Job #: [id number]

## 2011-02-16 NOTE — Discharge Summary (Signed)
NAME:  Louis [name], TABB NO.:  [id number]   MEDICAL RECORD NO.:  [id number]                   PATIENT TYPE:  INP   LOCATION:  0472                                 FACILITY:  [facility]   PHYSICIAN:  Malcolm T. Russella Dar, M.D. [facility]          DATE OF BIRTH:  [birth year]   DATE OF ADMISSION:  08/12/2002  DATE OF DISCHARGE:  08/18/2002                                 DISCHARGE SUMMARY   ADMITTING DIAGNOSES:  55. A 75 year old white male with two-day history of acute severe right upper     quadrant abdominal pain with nausea and vomiting, rule out cholecystitis,     rule out bowel obstruction, rule out gastroenteritis.  2. History of hemochromatosis, stable status post previous phlebotomies.  3. History of diverticulosis.  4. Gastroesophageal reflux disease.  5. Adult-onset diabetes mellitus, current diet controlled.  6. Hypertension.  7. Hypothyroidism.  8. Osteoarthritis.  9. Status post remote appendectomy.   DISCHARGE DIAGNOSES:  1. Stable status post small bowel obstruction, etiology presumed secondary     to adhesions with small bowel follow through continuing to suggest     prominent jejunal loops; cannot rule out other etiology.  2. Fever, resolved.  3. Other diagnoses as listed above.   CONSULTATIONS:  None.   PROCEDURES:  1. CT scan of the abdomen and pelvis.  2. Plain abdominal films.  3. Small bowel follow through.  4. Upper abdominal ultrasound.   BRIEF HISTORY:  The patient is a very nice 75 year old white male, primary  patient of Dr. Alwyn Ren, known to Dr. Sheryn Bison for GI.  The patient has  history as described above.  He had been followed by Dr. Jarold Motto for  hemochromatosis and has had stable ferratin levels over the past couple of  years and previously had undergone phlebotomies.  The patient had been  feeling well over the past few months with no abdominal complaints and  normal bowel habits.  He does have reflux and his symptoms  are well  controlled with Prevacid.  He relates over the past two days development of  severe, crampy, right upper quadrant abdominal pain associated with nausea  and vomiting.  He had been unable to keep down any p.o.'s and had not had a  bowel movement in greater than 24 hours.  He had not had any fever or chills  that he was aware of, no darkening of his urine or jaundice.  He presented  to the emergency room due to pain, nausea, and vomiting, was seen and  evaluated by Dr. Arlyce Dice who was covering on call, and admitted for further  diagnostic workup.  He was hemodynamically stable and afebrile on admission  but with significant right upper quadrant tenderness and rebound.   LABORATORY STUDIES:  On admission, wbc 11.6, hemoglobin 14.8, hematocrit  44.3, MCV 82.8, platelets 348.  Serial values were obtained; on November 16  wbc was 9.5, hemoglobin 12.6, hematocrit  37.1, platelets 302.  Electrolytes  within normal limits on admission:  Glucose 144, BUN 18, creatinine 1.0,  albumin 4.7.  Follow-up on November 14, again electrolytes normal; glucose  121.  Liver function studies entirely normal.  Amylase 50, lipase 27 on  admission.  Urinalysis negative.  EKG on admission:  Normal sinus rhythm.   X-ray studies:  KUB on admission consistent with small bowel obstruction.  CT scan shows a 1.5 cm low attenuation lesion in the right lobe of the liver  consistent with a simple cyst; gallbladder unremarkable, pancreas  unremarkable, no ductal dilation, partial small bowel obstruction at the  approximate level of the mid ileum; transition point appears to be in the  right mid pelvis; no mass identified; sigmoid diverticulosis without  diverticulitis.  Upper abdominal ultrasound on November 12 shows gallbladder  unremarkable, no ductal dilation, no stones.  Plain films on November 14  showed interval improvement in small bowel obstruction with a minimal amount  of air fluid levels.  Follow-up on  November 16 showed minimal small bowel  prominence, no definite obstruction.  Small bowel follow through on November  17 shows no obstruction; however, there does seem to be some dilation of the  jejunal loops which seem to be the point of dilation on the plain films  earlier; consider adhesions.   HOSPITAL COURSE:  The patient was admitted to the service of Dr. Melvia Heaps.  He was placed on IV fluids, given morphine for pain, and Phenergan  for nausea.  He underwent plain films, CT scan of the abdomen and pelvis,  and upper abdominal ultrasound within the first 24 hours for diagnostic  purposes.  He was found to have a small bowel obstruction presumably  secondary to adhesions from remote appendectomy.  NG tube was placed for  decompression and he had large volume output with improvement in his pain.  On November 14 he had some continued decrease in discomfort, did have a  temperature to 100.5.  Abdominal films were showing some improvement and we  started clamping the NG tube which he tolerated without difficulty.  On  November 15 the NG was left clamped and he was started on some clear  liquids.  Again, he tolerated this well and stated that the NG was not  bothering him so we left it in an extra day or so.  He continued to complain  of some soreness in his abdomen.  On November 16 he had a temperature to  101.3 and sweats.  Chest x-ray was done which did show some minimal  atelectasis in the right upper lobe.  We started incentive spirometry.  There was no other infectious source evident.  By November 17 he was  tolerating clear liquids without difficulty and had had several bowel  movements, was afebrile with a temperature of 97.5.  We proceeded with small  bowel follow through via the NG tube with findings as outlined above,  showing no evidence of obstruction but some continued prominent jejunal  loops.  His NG was then discontinued, we advanced his diet which he tolerated  without difficulty, and on November 18 he was allowed discharge to  home in a stable and improved condition.   DISPOSITION:  To follow up with Dr. Yancey Flemings on December 3 at 9:30 a.m.  The patient had requested follow up with Dr. Marina Goodell as he had seen him  several times in the hospital and had not seen Dr. Jarold Motto for some time.  He was asked to call for any problems in the interim.   DIET ON DISCHARGE:  Regular.   ACTIVITY:  As tolerated.   MEDICATIONS:  All medications as previous:  1. Synthroid 0.125 q.d.  2. Xanax 05 p.r.n.  3. Prevacid 30 q.d.  4. Aspirin 81 mg q.d.  5. Diltiazem 240 q.d.  6. Tylenol P.M. on a p.r.n. basis.    FOLLOW-UP:  Depending on his course we felt that it would be reasonable to  do a capsule endoscopy as an outpatient to exclude any other subtle  abnormality accounting for his small bowel obstruction and Dr. Marina Goodell will  discuss this with the patient further on follow-up.     Amy Esterwood, P.A.-C. [facility]                Malcolm T. Russella Dar, M.D. [facility]    AE/MEDQ  D:  08/24/2002  T:  08/24/2002  Job:  [id number]   cc:   Titus Dubin. Alwyn Ren, M.D. [facility]

## 2011-02-16 NOTE — H&P (Signed)
NAME:  Louis [name], Louis [name] NO.:  [id number]   MEDICAL RECORD NO.:  [id number]                   PATIENT TYPE:  EMS   LOCATION:  ED                                   FACILITY:  [facility]   PHYSICIAN:  Sean A. Everardo All, M.D. [facility]           DATE OF BIRTH:  Feb 21, 1936   DATE OF ADMISSION:  01/31/2003  DATE OF DISCHARGE:                                HISTORY & PHYSICAL   REASON FOR ADMISSION:  Intractable vomiting with question of small-bowel  obstruction.   HISTORY OF PRESENT ILLNESS:  The patient is a 75 year old man with one day  of severe nausea and vomiting.  He has associated diarrhea and intermittent  abdominal pain which localizes to the right lower quadrant.   PAST MEDICAL HISTORY:  1. Hypertension.  2. SBO episode 2003.  3. Hypothyroidism.  4. GERD.  5. Hearing deficit.   MEDICATIONS:  Synthroid, Prevacid, and Cardizem CD - all at uncertain  dosages.   SOCIAL HISTORY:  The patient neither drinks nor smokes.  He is retired.  His  wife is here.   FAMILY HISTORY:  No one else at home is ill.   REVIEW OF SYSTEMS:  Denies the following:  Loss of consciousness, fever,  shortness of breath, chest pain, dysuria, rectal bleeding, hematuria, skin  rash, weight change, and cough.   PHYSICAL EXAMINATION:  VITAL SIGNS:  Blood pressure 154/79, heart rate is  184 (improved upon recheck), respiratory rate is 24, the patient is  afebrile.  GENERAL:  In no distress.  SKIN:  Not diaphoretic.  HEENT:  Head is atraumatic.  Sclerae nonicteric.  Pharynx clear.  NECK:  Supple.  CHEST:  Clear to auscultation.  CARDIOVASCULAR:  No JVD.  No edema.  Regular rate and rhythm.  No murmur.  ABDOMEN:  Soft.  Slight right-sided abdominal tenderness.  No  hepatosplenomegaly, no mass.  Bowel sounds are active.  RECTAL:  Not done at this time due to patient being uncomfortable.  EXTREMITIES:  No obvious deformity.  NEUROLOGIC:  Well-oriented.  Moves all fours.  Does not  appear anxious or  depressed.   LABORATORY DATA:  Abdominal x-rays:  Small-bowel obstruction versus ileus.   CBC remarkable for WBC 10,800, hemoglobin 16.7, platelets 340.  CMET  remarkable for glucose 160.   IMPRESSION:  1. Nausea, vomiting, diarrhea, intractable, with possibly some element of     small-bowel obstruction.  2. Hyperglycemia.  3. Other chronic medical problems as noted above.  4. Uncertain dosages of outpatient medications.   PLAN:  1. I discussed risk of hospitalization versus going home with the patient.     The patient agrees to hospitalization.  2. Intravenous fluid.  3. Bowel rest.  4. Check CBGs.  5. Insulin as indicated by CBGs.  6. Change Cardizem to Zestril, as it is possible that Cardizem would     exacerbate his SBO.  7. The patient and his  wife insist that GI be called, and we will consult     them tomorrow.  8. Full code at the patient's request.  However, he states he would not to     be started nor maintained on artificial life-support measures if there     was not a reasonable chance of a functional recovery.  9. Outpatient medications, except for the Cardizem, at empiric dosages     pending learning home medication dosages.                                               Sean A. Everardo All, M.D. [facility]    SAE/MEDQ  D:  01/31/2003  T:  01/31/2003  Job:  [id number]   cc:   Titus Dubin. Alwyn Ren, M.D. [facility]

## 2011-02-16 NOTE — H&P (Signed)
NAME:  Louis [name], Louis [name] NO.:  [id number]   MEDICAL RECORD NO.:  [id number]                   PATIENT TYPE:  INP   LOCATION:  0472                                 FACILITY:  [facility]   PHYSICIAN:  Barbette Hair. Arlyce Dice, M.D. [facility]          DATE OF BIRTH:  [birth year]   DATE OF ADMISSION:  08/12/2002  DATE OF DISCHARGE:                                HISTORY & PHYSICAL   CHIEF COMPLAINT:  Right abdominal pain, severe with nausea and vomiting  x 2  days.   HISTORY OF PRESENT ILLNESS:  The patient is a pleasant 75 year old, white  male, primary patient of Dr. Alwyn Ren and known to Dr. Sheryn Bison for GI.  The patient has a history of adult onset diabetes mellitus, currently diet  controlled, hemachromatosis with stable ferritin levels over the past year  or so, status post prior phlebotomies. He has a history of GERD and  diverticular disease as well as hypothyroidism and hypertension. He is  status post remote appendectomy. The patient  relates that he has been  feeling fine over the past few months with no abdominal discomfort,  cramping, bloating, etc. His bowel habits have been normal. He has not noted  any melena or hematochezia. His reflux symptoms are generally well  controlled with Prevacid.   He relates a history over the past two days of development of severe, crampy  right upper quadrant abdominal pain associated with nausea and vomiting. He  has been unable to keep down any significant p.o.s and he has not had a  bowel movement in greater than 24 hours. He has not had any fever or chills  that he is aware of, no darkening of his urine or jaundice.   He presents to the emergency room due to severity of pain. He was  seen and  evaluated by Dr. Arlyce Dice and admitted for further diagnostic workup. He was  hemodynamically stable and afebrile at the time of evaluation in the  emergency room, but with significant right upper quadrant tenderness and   rebound.   CURRENT MEDICATIONS:  1. Synthroid 0.125 q.d.  2. Xanax 0.5 p.r.n.  3. Prevacid 30 q.d.  4. Aspirin 81 mg q.d.  5. Diltiazem 240 q.d.  6. Tylenol p.m. p.r.n.   ALLERGIES:  No medicine allergies. Shellfish causes itching.   PAST MEDICAL HISTORY:  As outlined above. He does have a history of  degenerative joint disease, status post right rotator cuff repair x 3 and  had a surgery on his right ear. Other problems as outlined above.  Also  deafness left ear.   SOCIAL HISTORY:  The patient is married. He is an ex-smoker, having  smoked  for 25 years and quit in 1982. He does drink occasional social alcohol.   FAMILY HISTORY:  Negative for GI disease as far as he is aware. No other  family members with hemachromatosis.   REVIEW OF SYSTEMS:  CARDIOVASCULAR:  Negative for chest pain or anginal  symptoms. PULMONARY:  Negative for cough, shortness of breath, sputum  production.  GENITOURINARY:  Negative for dysuria, urgency or frequency. GI:  As above.   PHYSICAL EXAMINATION:  GENERAL:  A well developed white male in no acute  distress. He is in pain.  VITAL SIGNS:  Blood pressure  153/71, pulse 64, respirations 16, afebrile at  the time of admission.  HEENT:  Normocephalic, atraumatic. EOMI, PERRLA.  Sclerae anicteric. He has  a hearing aide in the right ear.  NECK:  Supple without nodes.  CARDIOVASCULAR:  Regular rate and rhythm with S1 and S2, no murmurs, rubs,  gallops.  PULMONARY:  Clear to auscultation and percussion.  ABDOMEN:  Somewhat distended, tender, primarily in the right upper quadrant,  right mid quadrant with guarding. No definite rebound, no  palpable mass or  hepatosplenomegaly. He does have an appendectomy incisional scar in the  right lower quadrant.  RECTAL:  Not done on admission.  EXTREMITIES:  No cyanosis, clubbing or edema.  NEUROLOGIC:  Grossly nonfocal. He is hard of hearing.   LABORATORY DATA:  On admission, WBCs 11.6, hemoglobin 14.8,  hematocrit 44.4,  neutrophils 86%. Electrolytes within normal limits, potassium 3.8, glucose  144, BUN 18, creatinine 1. Liver function studies entirely normal, lipase  27. Urinalysis negative.   IMPRESSION:  56. A 75 year old white male with a two day history of acute severe right     upper quadrant pain with nausea and vomiting, rule out cholecystitis,     rule out bowel obstruction, rule out gastroenteritis.  2. History of hemachromatosis.  3. History of diverticulosis.  4. Chronic GERD.  5. Adult onset diabetes mellitus, currently diet controlled.  6. Hypertension.  7. Hypothyroidism.  8. Osteoarthritis.  9. Status post remote appendectomy.   PLAN:  The patient is admitted to the service of Dr. Melvia Heaps for IV  fluid hydration, analgesics and antiemetics as needed. Will check an upper  abdominal ultrasound  and then  further diagnostic evaluation pending  findings of above.       Mike Gip, P.A.-C. [facility]                Robert D. Arlyce Dice, M.D. [facility]    AE/MEDQ  D:  08/13/2002  T:  08/13/2002  Job:  [id number]   cc:   Titus Dubin. Alwyn Ren, M.D. [facility]

## 2011-02-16 NOTE — Discharge Summary (Signed)
NAME:  Louis [name], Louis [name] NO.:  [id number]   MEDICAL RECORD NO.:  [id number]                   PATIENT TYPE:  INP   LOCATION:  0455                                 FACILITY:  [facility]   PHYSICIAN:  Rene Paci, M.D. [place]          DATE OF BIRTH:  17-Oct-1935   DATE OF ADMISSION:  01/31/2003  DATE OF DISCHARGE:  02/03/2003                                 DISCHARGE SUMMARY   DISCHARGE DIAGNOSES:  1. Nausea and vomiting secondary to recurrent partial small-bowel     obstruction, resolved.  2. Hypertension.  3. Type 2 diabetes.  4. Hypothyroidism.  5. Itching secondary to presumed allergic reaction to DEMEROL versus     INTRAVENOUS CONTRAST.   DISCHARGE MEDICATIONS:  Discharge medications are as prior to admission and  include Synthroid, Prevacid, and Cardizem CD (doses uncertain by the patient  and not checked with outpatient records).   CONDITION ON DISCHARGE:  Medically stable and improved, tolerating regular  diet without difficulty.   CONSULTATIONS:  Consults include Dr. Yancey Flemings of [facility].   PROCEDURES:  Procedures include a CT of the abdomen and pelvis on 02/02/2003  showing top-normal to minimally dilated loops of small bowel within pelvis  with minimal amount of fluid, question mild partial small-bowel obstruction,  otherwise negative study.  Followup KUB on 02/03/2003 showed complete  resolution of this partial small-bowel obstruction.   HOSPITAL FOLLOWUP:  Hospital followup will be with the patient's primary  care physician, Dr. Lona Kettle on an as-needed basis and with Dr. Yancey Flemings of GI on an as-needed basis.   HOSPITAL COURSE:  Problem 1. RECURRENT PARTIAL SMALL-BOWEL OBSTRUCTION.  The  patient is a pleasant 75 year old gentleman who presented to the emergency  room day of admission secondary to 24 hours of nausea, vomiting, abdominal  pain, and occasional diarrhea.  He had had an episode of small-bowel  obstruction in 2003 and felt that these symptoms were similar to that  presentation.  On physical exam and evaluation in the emergency room the  patient did have decreased bowel sounds and was admitted for supportive  treatment of his recurrent small-bowel obstruction.  During his previous  workup in 2003 it was felt that the obstruction was due to adhesions within  his abdomen.  Discussion was made regarding consulting surgery but GI felt  as long as the patient had 0-3 episodes of recurrent obstruction annually  that it was better to continue medical management.  The patient strongly  agreed with this.  The patient was kept n.p.o. during this hospitalization  until his symptoms had resolved, within 48 hours he was tolerating liquids  without difficulty and by the day of discharge was tolerating solid regular  diet without symptoms.   Problem 2. OTHER MEDICAL ISSUES.  The patient's hypothyroidism and  hypertension were well controlled during this hospitalization.  It should be  noted the patient was uncertain of his home  dose medications and because of  the weekend schedule it was difficult to obtain records from an outpatient  basis.  He was treated with average doses of Synthroid 100 mcg daily and  Cardizem during his hospitalization and his blood pressure remained well  controlled.  He is to resume his usual home medication doses upon return and  discharge.   Problem 3. ALLERGIC REACTION.  On the patient's second day of  hospitalization he developed hives and itching along his arms and back.  He  reported having developed an allergy to [facility] in the past year which he  self-treated with Benadryl.  Reviewing his potential contacts for allergic  reaction it was felt that DEMEROL and/or IV CONTRAST given for the CT were  the most likely triggers for his allergic reaction.  Demerol was changed to  Vicodin and the patient was obviously given no further IV contrast during  this  hospitalization.  The patient's itching resolved in 24 hours and was  controlled in the meanwhile with Benadryl p.r.n.  The patient is encouraged  to use other forms of narcotic medication in the future besides DEMEROL  should the need arise for them.                                               Rene Paci, M.D. [facility]    VL/MEDQ  D:  02/03/2003  T:  02/03/2003  Job:  [id number]

## 2011-02-16 NOTE — Assessment & Plan Note (Signed)
[facility]                                 ON-CALL NOTE   DMARI, SCHUBRING                     MRN:          [id number]  DATE:12/19/2006                            DOB:          10-Feb-1936    Shona Needles [phone] calling on behalf of [facility] Parrish Bonn, patient of Dr. Alwyn Ren.   Daughter calling from three hours away in [place], stating that her  father has been sick all day.  He has had abdominal pain and vomiting.  She said her mother just had foot surgery on Tuesday.  She wants to know  what would be the best thing to do.  I recommended since he was ill and  having abdominal pain and vomiting, he should come to [facility]  Emergency Room for evaluation.  She said, Well neither of them can  drive.  The father cannot drive because he is too sick and the mother  had foot surgery.  I then offered they might be able to get a cab, they  might have a friend or a neighbor or relative bring them to the  hospital.  She said she lived three hours away and she has two small  children and, I can't get there right now.  I again explained there  are ways to get that, either a cab, or friends or family, or whatever,  but that would be my advice.     Jeffrey A. Tawanna Cooler, MD  Electronically Signed    JAT/MedQ  DD: 12/19/2006  DT: 12/19/2006  Job #: [id number]

## 2011-02-21 ENCOUNTER — Other Ambulatory Visit: Payer: Medicare Other

## 2011-02-21 ENCOUNTER — Ambulatory Visit
Admission: RE | Admit: 2011-02-21 | Discharge: 2011-02-21 | Disposition: A | Discharge status: Home / Self Care | Payer: Medicare Other | Source: Ambulatory Visit | Attending: Orthopedic Surgery | Admitting: Orthopedic Surgery

## 2011-02-21 DIAGNOSIS — M25512 Pain in left shoulder: Secondary | ICD-10-CM

## 2011-03-19 ENCOUNTER — Other Ambulatory Visit: Payer: Self-pay | Admitting: *Deleted

## 2011-03-19 MED ORDER — ROSUVASTATIN CALCIUM 10 MG PO TABS: 10.0000 mg | ORAL_TABLET | Freq: Every day | ORAL | Status: DC

## 2011-03-22 ENCOUNTER — Encounter: Payer: Self-pay | Admitting: Internal Medicine

## 2011-03-23 ENCOUNTER — Ambulatory Visit (HOSPITAL_COMMUNITY)
Admission: RE | Admit: 2011-03-23 | Discharge: 2011-03-23 | Disposition: A | Discharge status: Home / Self Care | Payer: Medicare Other | Source: Ambulatory Visit | Attending: Orthopedic Surgery | Admitting: Orthopedic Surgery

## 2011-03-23 ENCOUNTER — Encounter: Payer: Self-pay | Admitting: Internal Medicine

## 2011-03-23 ENCOUNTER — Encounter (HOSPITAL_COMMUNITY)
Admission: RE | Admit: 2011-03-23 | Discharge: 2011-03-23 | Disposition: A | Discharge status: Home / Self Care | Payer: Medicare Other | Source: Ambulatory Visit | Attending: Orthopedic Surgery | Admitting: Orthopedic Surgery

## 2011-03-23 ENCOUNTER — Other Ambulatory Visit (HOSPITAL_COMMUNITY): Payer: Self-pay | Admitting: Orthopedic Surgery

## 2011-03-23 ENCOUNTER — Ambulatory Visit (INDEPENDENT_AMBULATORY_CARE_PROVIDER_SITE_OTHER): Payer: Medicare Other | Admitting: Internal Medicine

## 2011-03-23 DIAGNOSIS — M25519 Pain in unspecified shoulder: Secondary | ICD-10-CM | POA: Insufficient documentation

## 2011-03-23 DIAGNOSIS — E291 Testicular hypofunction: Secondary | ICD-10-CM

## 2011-03-23 DIAGNOSIS — Z01810 Encounter for preprocedural cardiovascular examination: Secondary | ICD-10-CM | POA: Insufficient documentation

## 2011-03-23 DIAGNOSIS — E039 Hypothyroidism, unspecified: Secondary | ICD-10-CM

## 2011-03-23 DIAGNOSIS — Z01818 Encounter for other preprocedural examination: Secondary | ICD-10-CM | POA: Insufficient documentation

## 2011-03-23 DIAGNOSIS — I10 Essential (primary) hypertension: Secondary | ICD-10-CM

## 2011-03-23 DIAGNOSIS — E782 Mixed hyperlipidemia: Secondary | ICD-10-CM

## 2011-03-23 DIAGNOSIS — M25512 Pain in left shoulder: Secondary | ICD-10-CM

## 2011-03-23 DIAGNOSIS — E785 Hyperlipidemia, unspecified: Secondary | ICD-10-CM

## 2011-03-23 DIAGNOSIS — M75102 Unspecified rotator cuff tear or rupture of left shoulder, not specified as traumatic: Secondary | ICD-10-CM

## 2011-03-23 DIAGNOSIS — E559 Vitamin D deficiency, unspecified: Secondary | ICD-10-CM

## 2011-03-23 DIAGNOSIS — E119 Type 2 diabetes mellitus without complications: Secondary | ICD-10-CM

## 2011-03-23 DIAGNOSIS — Z01812 Encounter for preprocedural laboratory examination: Secondary | ICD-10-CM | POA: Insufficient documentation

## 2011-03-23 DIAGNOSIS — S43429A Sprain of unspecified rotator cuff capsule, initial encounter: Secondary | ICD-10-CM

## 2011-03-23 LAB — CBC
HCT: 42.5 % (ref 39.0–52.0)
Hemoglobin: 14.3 g/dL (ref 13.0–17.0)
RBC: 4.62 MIL/uL (ref 4.22–5.81)
WBC: 8.6 10*3/uL (ref 4.0–10.5)

## 2011-03-23 LAB — BASIC METABOLIC PANEL
BUN: 19 mg/dL (ref 6–23)
Calcium: 9.6 mg/dL (ref 8.4–10.5)
GFR calc Af Amer: >60 mL/min (ref >60)
GFR calc non Af Amer: >60 mL/min (ref >60)
Potassium: 4.5 mEq/L (ref 3.5–5.1)
Sodium: 140 mEq/L (ref 135–145)

## 2011-03-23 LAB — DIFFERENTIAL
Basophils Absolute: 0 10*3/uL (ref 0.0–0.1)
Lymphocytes Relative: 34 % (ref 12–46)
Neutro Abs: 4.4 10*3/uL (ref 1.7–7.7)
Neutrophils Relative %: 52 % (ref 43–77)

## 2011-03-23 LAB — URINALYSIS, ROUTINE W REFLEX MICROSCOPIC
Glucose, UA: NEGATIVE mg/dL
Hgb urine dipstick: NEGATIVE
Leukocytes, UA: NEGATIVE
Specific Gravity, Urine: 1.032 — ABNORMAL HIGH (ref 1.005–1.030)

## 2011-03-23 LAB — TYPE AND SCREEN: Antibody Screen: NEGATIVE

## 2011-03-23 LAB — PROTIME-INR
INR: 0.98 (ref 0.00–1.49)
Prothrombin Time: 13.2 seconds (ref 11.6–15.2)

## 2011-03-23 NOTE — Progress Notes (Signed)
Subjective:    Patient ID: Louis [name], male    DOB: October 26, 1935, 75 y.o.   MRN: [id number]  HPI Extremity pain L shoulder Onset:2/16 Trigger/injury:11/16/2010 with torn rotator cuff; PMH of L humeral head 2008 Pain quality:sharp,  Pain severity:up to 9 Duration:few minutes Radiation:no Exacerbating factors:elevation of humerus Review of systems: Constitutional: fever, chills, sweats :no Musculoskeletal: muscle cramp or pain:no;  joint stiffness: yes; redness or  swelling:no Skin:rash, color change:no Neuro:weakness:no;  numbness and tingling:no Heme abnormal bleeding or clotting:no Treatment/response:steroid injection 4 weeks ago w/o help     Review of Systems Diabetes status assessment: Fasting or morning glucose range:  103-112 or average :  105  . Highest glucose 2 hours after any meal:  < 150. Hypoglycemia :  no .                                                     Excess thirst; hunger;urination:  no.                                  Lightheadedness with standing:  no. Chest pain; Palpitations; Pain in  calves with walking:  no .                                                                                                                                 Non healing skin  ulcers or sores,especially over the feet:  no. Numbness or tingling or burning in feet : no .                                                                                                                                              Significant change in  Weight : up 5#. Vision changes : no  .  Exercise : no . Nutrition/diet:  No specific diet. Medication compliance : yes. Medication adverse  Effects:  no . Eye exam : 6 mos ; no DM  retinopathy. Foot care : no.  A1c/ urine microalbumin monitor:  A1c 6.7% in 11/11        Objective:   Physical Exam Gen.: Healthy and well-nourished in appearance. Alert, appropriate and cooperative  throughout exam. Head: Normocephalic without obvious abnormalities Eyes: No corneal or conjunctival inflammation noted. Pupils equal round reactive to light and accommodation.  Ears: External  ear exam reveals no significant lesions or deformities. Canals clear .TMs with minimal scarring. Hearing is grossly  Decreased bilaterally. Nose: External nasal exam reveals no deformity or inflammation. Nasal mucosa are pink and moist. No lesions or exudates noted.  Mouth: Oral mucosa and oropharynx reveal no lesions or exudates. Teeth in good repair. Neck: No deformities, masses, or tenderness noted. Range of motion & . Thyroid normal. Lungs: Normal respiratory effort; chest expands symmetrically. Lungs are clear to auscultation without rales, wheezes, or increased work of breathing. Heart: Normal rate and rhythm. Normal S1 and S2. No gallop, click, or rub. S4 w/o  murmur. Abdomen: Bowel sounds normal; abdomen soft and nontender. No masses, organomegaly or hernias noted. Genitalia: deferred                                                                                      Musculoskeletal/extremities: No deformity or scoliosis noted of  the thoracic or lumbar spine. No clubbing, cyanosis, or  edema Range of motion   Decreased R > L knee & @  L shoulder due to pain .Tone & strength  Normal.Joints: crepitus of knees; op scar L. Minor DIP changes. normal. Nail health  good. Vascular: Carotid, radial artery, dorsalis pedis and dorsalis posterior tibial pulses are full and equal. No bruits present. Neurologic: Alert and oriented x3. Deep tendon reflexes symmetrical  but decreased @ knees. Light touch normal over feet         Skin: Intact without suspicious lesions or rashes. Lymph: No cervical, or  Axillary  lymphadenopathy present. Psych: Mood and affect are normal. Normally interactive                                                                                         Assessment & Plan:  #1 Rotator  Cuff Tear; cleared for Surgery if DM controlled #2 DM , ? Control #3 dyslipidemia, lipids due Plan: fasting lipids & A1c, urine microalbumin

## 2011-03-23 NOTE — Patient Instructions (Signed)
I can clear you for surgery if you get fasting labs done: A1c, urine microalbumin, Lipids, TSH ( 250.00, 272.4, 244.9)

## 2011-03-25 ENCOUNTER — Encounter: Payer: Self-pay | Admitting: Internal Medicine

## 2011-03-25 DIAGNOSIS — E559 Vitamin D deficiency, unspecified: Secondary | ICD-10-CM | POA: Insufficient documentation

## 2011-03-25 NOTE — Progress Notes (Signed)
  Subjective:    Patient ID: GAD AYMOND, male    DOB: November 25, 1935, 75 y.o.   MRN: [id number]  HPI    Review of Systems     Objective:   Physical Exam        Assessment & Plan:

## 2011-03-25 NOTE — Progress Notes (Signed)
  Subjective:    Patient ID: Louis [name], male    DOB: [birth year], 75 y.o.   MRN: [id number]  HPI S/P L humearl head replacement 2008    Review of Systems     Objective:   Physical Exam        Assessment & Plan:

## 2011-03-26 ENCOUNTER — Other Ambulatory Visit (INDEPENDENT_AMBULATORY_CARE_PROVIDER_SITE_OTHER): Payer: Medicare Other

## 2011-03-26 DIAGNOSIS — E785 Hyperlipidemia, unspecified: Secondary | ICD-10-CM

## 2011-03-26 DIAGNOSIS — E119 Type 2 diabetes mellitus without complications: Secondary | ICD-10-CM

## 2011-03-26 DIAGNOSIS — E039 Hypothyroidism, unspecified: Secondary | ICD-10-CM

## 2011-03-26 LAB — LDL CHOLESTEROL, DIRECT: Direct LDL: 110.3 mg/dL

## 2011-03-26 LAB — MICROALBUMIN / CREATININE URINE RATIO
Creatinine,U: 328.9 mg/dL
Microalb Creat Ratio: 0.9 mg/g (ref 0.0–30.0)

## 2011-03-26 LAB — HEMOGLOBIN A1C: Hgb A1c MFr Bld: 6.6 % — ABNORMAL HIGH (ref 4.6–6.5)

## 2011-03-26 LAB — LIPID PANEL: Cholesterol: 197 mg/dL (ref 0–200)

## 2011-03-26 NOTE — Progress Notes (Signed)
Labs only

## 2011-03-29 ENCOUNTER — Ambulatory Visit (HOSPITAL_COMMUNITY): Payer: Medicare Other

## 2011-03-29 ENCOUNTER — Ambulatory Visit (HOSPITAL_COMMUNITY)
Admission: RE | Admit: 2011-03-29 | Discharge: 2011-03-31 | Disposition: A | Discharge status: Home / Self Care | Payer: Medicare Other | Source: Ambulatory Visit | Attending: Orthopedic Surgery | Admitting: Orthopedic Surgery

## 2011-03-29 DIAGNOSIS — K219 Gastro-esophageal reflux disease without esophagitis: Secondary | ICD-10-CM | POA: Insufficient documentation

## 2011-03-29 DIAGNOSIS — Z96619 Presence of unspecified artificial shoulder joint: Secondary | ICD-10-CM | POA: Insufficient documentation

## 2011-03-29 DIAGNOSIS — Z87891 Personal history of nicotine dependence: Secondary | ICD-10-CM | POA: Insufficient documentation

## 2011-03-29 DIAGNOSIS — E119 Type 2 diabetes mellitus without complications: Secondary | ICD-10-CM | POA: Insufficient documentation

## 2011-03-29 DIAGNOSIS — T84029A Dislocation of unspecified internal joint prosthesis, initial encounter: Secondary | ICD-10-CM | POA: Insufficient documentation

## 2011-03-29 DIAGNOSIS — M129 Arthropathy, unspecified: Secondary | ICD-10-CM | POA: Insufficient documentation

## 2011-03-29 DIAGNOSIS — I10 Essential (primary) hypertension: Secondary | ICD-10-CM | POA: Insufficient documentation

## 2011-03-29 DIAGNOSIS — F411 Generalized anxiety disorder: Secondary | ICD-10-CM | POA: Insufficient documentation

## 2011-03-29 DIAGNOSIS — W19XXXA Unspecified fall, initial encounter: Secondary | ICD-10-CM | POA: Insufficient documentation

## 2011-03-29 DIAGNOSIS — E039 Hypothyroidism, unspecified: Secondary | ICD-10-CM | POA: Insufficient documentation

## 2011-03-29 LAB — GLUCOSE, CAPILLARY: Glucose-Capillary: 101 mg/dL — ABNORMAL HIGH (ref 70–99)

## 2011-03-30 LAB — BASIC METABOLIC PANEL
CO2: 26 mEq/L (ref 19–32)
Chloride: 102 mEq/L (ref 96–112)
Creatinine, Ser: 0.96 mg/dL (ref 0.50–1.35)
GFR calc Af Amer: >60 mL/min (ref >60)
Potassium: 4.4 mEq/L (ref 3.5–5.1)
Sodium: 137 mEq/L (ref 135–145)

## 2011-03-30 LAB — GLUCOSE, CAPILLARY
Glucose-Capillary: 121 mg/dL — ABNORMAL HIGH (ref 70–99)
Glucose-Capillary: 117 mg/dL — ABNORMAL HIGH (ref 70–99)
Glucose-Capillary: 157 mg/dL — ABNORMAL HIGH (ref 70–99)
Glucose-Capillary: 117 mg/dL — ABNORMAL HIGH (ref 70–99)

## 2011-03-30 LAB — CBC
Platelets: 243 10*3/uL (ref 150–400)
RBC: 3.88 MIL/uL — ABNORMAL LOW (ref 4.22–5.81)
RDW: 14.1 % (ref 11.5–15.5)
WBC: 8.8 10*3/uL (ref 4.0–10.5)

## 2011-03-31 LAB — GLUCOSE, CAPILLARY: Glucose-Capillary: 126 mg/dL — ABNORMAL HIGH (ref 70–99)

## 2011-04-20 NOTE — Op Note (Signed)
NAME:  Louis [name], Louis [name] NO.:  [id number]  MEDICAL RECORD NO.:  [id number]  LOCATION:  5007                         FACILITY:  [facility]  PHYSICIAN:  Almedia Balls. Ranell Patrick, M.D. DATE OF BIRTH:  12/31/1935  DATE OF PROCEDURE:  03/29/2011 DATE OF DISCHARGE:                              OPERATIVE REPORT   PREOPERATIVE DIAGNOSIS:  Left shoulder painful hemiarthroplasty, status post fall with rotator cuff insufficiency.  POSTOPERATIVE DIAGNOSIS:  Left shoulder painful hemiarthroplasty, status post fall with rotator cuff insufficiency.  PROCEDURE PERFORMED:  Left shoulder revision total shoulder arthroplasty using DePuy Delta Xtend reverse shoulder replacement.  ATTENDING SURGEON:  Almedia Balls. Ranell Patrick, MD  ASSISTANT:  Konrad Felix Dixon PA-C.  ANESTHESIA:  General anesthesia was used plus interscalene block.  ESTIMATED BLOOD LOSS:  100 mL.  FLUID PLACEMENT:  300 mL crystalloid.  URINE OUTPUT:  800 mL.  Instrument counts correct.  There were no complications.  Perioperative antibiotics were given.  INDICATION:  The patient is a 75 year old male with a history of left shoulder fracture requiring shoulder hemiarthroplasty about 5 years ago. The patient did well following that surgery until a recent fall at a business in [place].  The patient landed directly on his left elbow at jamming his left shoulder.  Patient has had worsening pain since that time.  He has been unable to elevate his arm since that fall.  The patient now presents with superior head migration on x-rays concerning for rotator cuff insufficiency given the refractory nature of the patient's pain, failure to progress with modification of activities and exercises, the patient presents now for revision to a reverse total shoulder arthroplasty to relieve pain and restore function of the shoulder.  Informed consent was obtained.  DESCRIPTION OF PROCEDURE:  After an adequate level of anesthesia  was achieved, the patient was positioned in the modified beach-chair position.  Left shoulder was correctly identified and sterilely prepped and draped in the usual manner.  We entered the shoulder using the patient's prior deltopectoral incision.  We used a 10 blade scalpel. Dissection down through subcutaneous tissues using Bovie.  Deltopectoral interval identified.  Cephalic vein taken laterally and the deltoid pectoralis taken medially.  We were able to get down to the humerus and subperiosteal dissected off the humerus working through scar planes and developing a freeing interval, so the humeral shaft and the humeral component could be rotated, clearly there was rotator cuff insufficiency in the supraspinatus and infraspinatus basically the tendon appeared stretched out and thinned and not in good condition.  Subscapularis was pretty thick and robust, but was scarred extensively to the underlying scapular neck and also to the conjoined tendon.  We did free the tendon up sufficient to remove the anterior-inferior capsule and performed a 360-degree release on the glenoid side.  There was significant evidence of wear posterosuperiorly and not a whole lot of evidence of recent bearing on the inferior portion of the glenoid consistent with the superior migration that we saw on x-ray.  We went ahead and did release off the humerus such we could get the humeral head off using a wedge, we then went ahead and freed up the soft tissue around the  top of the humerus and we were able to get the soft tissue and granulation tissue off around the implant and then placed our broach handle on the stem and then used that to back the stem out.  Once we had the stem out, then we used the [facility] revision set with curettes and osteotomes to remove the cement from the canal and then ream up to a size 12.  At this point, we went ahead and directed our attention towards the glenoid.  We placed a central  guide pin for the metaglene and then reamed over that for the metaglene baseplate.  Once that was done, we went ahead and drilled over the central guide pin for the central peg of the metaglene and then impacted the metaglene with attention towards appropriate rotation putting at 6 o'clock screw directly aligned with the scapular body. Once we had the metaglene securely in place, we then placed 4 screws all locked, 36 up into the coracoid base, 48 inferiorly and then two 24 locked screws anterior and posteriorly.  We had excellent purchase with our baseplate, placed a 42 in neutral glenosphere and in place and then went ahead and tightened that position, thoroughly irrigated and then trialed with a 12 DePuy 1 reverse Delta Xtend reverse stem in place set on 10 degrees of retroversion.  We placed a 42 x +3 poly, reduced the shoulder, we were happy with soft tissue tensioning, and good tension of the conjoined tendon.  No gapping with external rotation and no shuck. We went ahead and dislocated and removed the trial components, thoroughly pulse irrigated the canal, removing any extraneous cement, checked for any soft tissue, it could be impinging down around the inferior scapular neck and any bony impingement on the inferior humeral neck and then went ahead and cemented the component into place.  This was a size 12 DePuy 1 Delta Xtend cemented stem, set on 10 degrees retroversion.  Once the cement was allowed to harden, we went ahead and selected our 42 +3 poly and then impacted that into place and then reduced the shoulder and we are again happy with the soft tissue tension.  No signs of any instability at all.  No impingement. Thoroughly pulse irrigated.  Removed the remaining subscapularis tissue. Closed the deltopectoral interval with 0 Vicryl suture followed by 2-0 Vicryl subcutaneous closure, 4-0 Monocryl for skin.  Steri-Strips applied followed by sterile dressing.  The patient  tolerated surgery well.     Almedia Balls. Ranell Patrick, M.D.     SRN/MEDQ  D:  03/29/2011  T:  03/30/2011  Job:  [id number]  Electronically Signed by Malon Kindle  on 04/20/2011 12:07:34 AM

## 2011-04-27 ENCOUNTER — Other Ambulatory Visit: Payer: Self-pay | Admitting: Internal Medicine

## 2011-05-10 ENCOUNTER — Other Ambulatory Visit: Payer: Self-pay

## 2011-05-10 MED ORDER — ROSUVASTATIN CALCIUM 10 MG PO TABS: 10.0000 mg | ORAL_TABLET | Freq: Every day | ORAL | Status: DC

## 2011-05-10 NOTE — Telephone Encounter (Signed)
Spoke with patient, patient aware [facility]

## 2011-06-11 ENCOUNTER — Other Ambulatory Visit: Payer: Self-pay | Admitting: Orthopedic Surgery

## 2011-06-11 ENCOUNTER — Encounter (HOSPITAL_COMMUNITY): Payer: Medicare Other

## 2011-06-11 LAB — APTT: aPTT: 27 seconds (ref 24–37)

## 2011-06-11 LAB — CBC
HCT: 43 % (ref 39.0–52.0)
Hemoglobin: 13.9 g/dL (ref 13.0–17.0)
MCHC: 32.3 g/dL (ref 30.0–36.0)
MCV: 91.1 fL (ref 78.0–100.0)

## 2011-06-11 LAB — URINALYSIS, ROUTINE W REFLEX MICROSCOPIC
Bilirubin Urine: NEGATIVE
Leukocytes, UA: NEGATIVE
Nitrite: NEGATIVE
Specific Gravity, Urine: 1.021 (ref 1.005–1.030)
Urobilinogen, UA: 0.2 mg/dL (ref 0.0–1.0)
pH: 5.5 (ref 5.0–8.0)

## 2011-06-11 LAB — URINE MICROSCOPIC-ADD ON

## 2011-06-11 LAB — COMPREHENSIVE METABOLIC PANEL
ALT: 19 U/L (ref 0–53)
Calcium: 9.7 mg/dL (ref 8.4–10.5)
Creatinine, Ser: 1.34 mg/dL (ref 0.50–1.35)
GFR calc Af Amer: >60 mL/min (ref >60)
Glucose, Bld: 102 mg/dL — ABNORMAL HIGH (ref 70–99)
Sodium: 140 mEq/L (ref 135–145)
Total Protein: 7.8 g/dL (ref 6.0–8.3)

## 2011-06-11 LAB — PROTIME-INR: INR: 0.99 (ref 0.00–1.49)

## 2011-06-12 ENCOUNTER — Telehealth: Payer: Self-pay

## 2011-06-12 NOTE — Telephone Encounter (Signed)
Pre op  evaluation from June should suffice for preoperative clearance at this time. The patient states he has had no changes in his medical condition.

## 2011-06-12 NOTE — Telephone Encounter (Signed)
Patient aware of Dr.Hopper's response and denied any changes in medical condition or any hospitializations

## 2011-06-12 NOTE — Telephone Encounter (Signed)
Patient is scheduled to have surgery next week with Dr.Aluisio and questioned if he needs a surgical clearance. Patient recently had a surgery clearance appointment in June 2012.  Dr.Hopper please advise

## 2011-06-12 NOTE — Telephone Encounter (Signed)
Unless there has been a significant   change in his medical condition; preoperative evaluation of June should suffice. If he has had hospitalization or significant symptoms in the interim, he should be re -evaluated.

## 2011-06-20 ENCOUNTER — Inpatient Hospital Stay (HOSPITAL_COMMUNITY)
Admission: RE | Admit: 2011-06-20 | Discharge: 2011-06-23 | DRG: 470 | Disposition: A | Discharge status: Home Health | Payer: Medicare Other | Source: Ambulatory Visit | Attending: Orthopedic Surgery | Admitting: Orthopedic Surgery

## 2011-06-20 DIAGNOSIS — F411 Generalized anxiety disorder: Secondary | ICD-10-CM | POA: Diagnosis present

## 2011-06-20 DIAGNOSIS — E119 Type 2 diabetes mellitus without complications: Secondary | ICD-10-CM | POA: Diagnosis present

## 2011-06-20 DIAGNOSIS — Z96619 Presence of unspecified artificial shoulder joint: Secondary | ICD-10-CM

## 2011-06-20 DIAGNOSIS — K5730 Diverticulosis of large intestine without perforation or abscess without bleeding: Secondary | ICD-10-CM | POA: Diagnosis present

## 2011-06-20 DIAGNOSIS — Z7382 Dual sensory impairment: Secondary | ICD-10-CM

## 2011-06-20 DIAGNOSIS — K449 Diaphragmatic hernia without obstruction or gangrene: Secondary | ICD-10-CM | POA: Diagnosis present

## 2011-06-20 DIAGNOSIS — I498 Other specified cardiac arrhythmias: Secondary | ICD-10-CM | POA: Diagnosis present

## 2011-06-20 DIAGNOSIS — H9190 Unspecified hearing loss, unspecified ear: Secondary | ICD-10-CM | POA: Diagnosis present

## 2011-06-20 DIAGNOSIS — H9319 Tinnitus, unspecified ear: Secondary | ICD-10-CM | POA: Diagnosis present

## 2011-06-20 DIAGNOSIS — M1710 Unilateral primary osteoarthritis, unspecified knee: Principal | ICD-10-CM | POA: Diagnosis present

## 2011-06-20 DIAGNOSIS — K219 Gastro-esophageal reflux disease without esophagitis: Secondary | ICD-10-CM | POA: Diagnosis present

## 2011-06-20 DIAGNOSIS — Z01812 Encounter for preprocedural laboratory examination: Secondary | ICD-10-CM

## 2011-06-20 DIAGNOSIS — Z8701 Personal history of pneumonia (recurrent): Secondary | ICD-10-CM

## 2011-06-20 DIAGNOSIS — E7800 Pure hypercholesterolemia, unspecified: Secondary | ICD-10-CM | POA: Diagnosis present

## 2011-06-20 DIAGNOSIS — Z96659 Presence of unspecified artificial knee joint: Secondary | ICD-10-CM

## 2011-06-20 DIAGNOSIS — I10 Essential (primary) hypertension: Secondary | ICD-10-CM | POA: Diagnosis present

## 2011-06-20 DIAGNOSIS — H547 Unspecified visual loss: Secondary | ICD-10-CM | POA: Diagnosis present

## 2011-06-20 DIAGNOSIS — Z79899 Other long term (current) drug therapy: Secondary | ICD-10-CM

## 2011-06-20 DIAGNOSIS — Z7982 Long term (current) use of aspirin: Secondary | ICD-10-CM

## 2011-06-20 DIAGNOSIS — E039 Hypothyroidism, unspecified: Secondary | ICD-10-CM | POA: Diagnosis present

## 2011-06-20 LAB — GLUCOSE, CAPILLARY
Glucose-Capillary: 70 mg/dL (ref 70–99)
Glucose-Capillary: 71 mg/dL (ref 70–99)

## 2011-06-20 LAB — TYPE AND SCREEN: Antibody Screen: NEGATIVE

## 2011-06-21 LAB — BASIC METABOLIC PANEL
CO2: 29 mEq/L (ref 19–32)
Calcium: 8.2 mg/dL — ABNORMAL LOW (ref 8.4–10.5)
Chloride: 101 mEq/L (ref 96–112)
Glucose, Bld: 147 mg/dL — ABNORMAL HIGH (ref 70–99)
Potassium: 4.5 mEq/L (ref 3.5–5.1)
Sodium: 135 mEq/L (ref 135–145)

## 2011-06-21 LAB — CBC
Hemoglobin: 10.5 g/dL — ABNORMAL LOW (ref 13.0–17.0)
Platelets: 234 10*3/uL (ref 150–400)
RBC: 3.56 MIL/uL — ABNORMAL LOW (ref 4.22–5.81)
WBC: 7.9 10*3/uL (ref 4.0–10.5)

## 2011-06-21 LAB — GLUCOSE, CAPILLARY
Glucose-Capillary: 124 mg/dL — ABNORMAL HIGH (ref 70–99)
Glucose-Capillary: 122 mg/dL — ABNORMAL HIGH (ref 70–99)
Glucose-Capillary: 104 mg/dL — ABNORMAL HIGH (ref 70–99)
Glucose-Capillary: 131 mg/dL — ABNORMAL HIGH (ref 70–99)

## 2011-06-22 LAB — BASIC METABOLIC PANEL
Calcium: 8.8 mg/dL (ref 8.4–10.5)
GFR calc Af Amer: >60 mL/min (ref >60)
GFR calc non Af Amer: >60 mL/min (ref >60)
Glucose, Bld: 133 mg/dL — ABNORMAL HIGH (ref 70–99)
Sodium: 135 mEq/L (ref 135–145)

## 2011-06-22 LAB — CBC
Hemoglobin: 10.2 g/dL — ABNORMAL LOW (ref 13.0–17.0)
MCH: 29.2 pg (ref 26.0–34.0)
MCHC: 32.8 g/dL (ref 30.0–36.0)
RDW: 13.6 % (ref 11.5–15.5)

## 2011-06-22 LAB — GLUCOSE, CAPILLARY: Glucose-Capillary: 126 mg/dL — ABNORMAL HIGH (ref 70–99)

## 2011-06-23 LAB — CBC
Hemoglobin: 9.7 g/dL — ABNORMAL LOW (ref 13.0–17.0)
MCH: 29.2 pg (ref 26.0–34.0)
MCHC: 32.6 g/dL (ref 30.0–36.0)
MCV: 89.8 fL (ref 78.0–100.0)
Platelets: 246 10*3/uL (ref 150–400)
RBC: 3.32 MIL/uL — ABNORMAL LOW (ref 4.22–5.81)

## 2011-06-27 NOTE — Op Note (Signed)
NAME:  Louis [name], Louis [name] NO.:  [id number]  MEDICAL RECORD NO.:  [id number]  LOCATION:  0005                         FACILITY:  [facility]  PHYSICIAN:  Ollen Gross, M.D.    DATE OF BIRTH:  1936-03-21  DATE OF PROCEDURE:  06/20/2011 DATE OF DISCHARGE:                              OPERATIVE REPORT   PREOPERATIVE DIAGNOSIS:  Osteoarthritis, right knee.  POSTOPERATIVE DIAGNOSIS:  Osteoarthritis, right knee.  PROCEDURE:  Right total knee arthroplasty.  SURGEON:  Ollen Gross, MD  ASSISTANT:  Alexzandrew L. Perkins, P.A.C.  ANESTHESIA:  Spinal.  ESTIMATED BLOOD LOSS:  Minimal.  DRAIN:  Hemovac x1.  TOURNIQUET TIME:  37 minutes at 300 mmHg.  COMPLICATIONS:  None.  CONDITION:  Stable to Recovery.  BRIEF CLINICAL NOTE:  Louis [name] is a 75 year old male with advanced end-stage arthritis of the right knee with progressively worsening pain and dysfunction.  He has had a previous successful left total knee arthroplasty, presents now for right total knee arthroplasty.  PROCEDURE IN DETAIL:  After successful administration of spinal anesthetic, a tourniquet was placed high on his right thigh and his right lower extremity was prepped and draped in the usual sterile fashion.  Extremities wrapped in Esmarch, knee flexed, tourniquet inflated to 300 mmHg.  Midline incision was made with a 10 blade through the subcutaneous tissue to the level of the extensor mechanism.  A fresh blade was used to make a medial parapatellar arthrotomy.  Soft tissue on the proximal medial tibia was subperiosteally elevated to the joint line with the knife into the semimembranosus bursa with a Cobb elevator. Soft tissue laterally was elevated with attention being paid to avoid patellar tendon on tibial tubercle.  The patella was everted, knee flexed to 90 degrees, and ACL and PCL removed.  He has severe bone-on- bone arthritis of the medial and patellofemoral compartments.  Drill was used  to create a starting hole in the distal femur and the canal was thoroughly irrigated.  The 5-degree right valgus alignment guide was placed and the distal femoral cutting block was pinned to remove 11 mm off the distal femur.  Distal femoral resection was made with an oscillating saw.  The tibia subluxed forward and the menisci removed.  Extramedullary tibial alignment guide was placed referencing proximally to medial aspect of tibial tubercle and distally along the second metatarsal axis and tibial crest.  The block was pinned to remove 2 mm of the more deficient medial side.  Tibial resection was made with an oscillating saw.  Size 5 was the most appropriate tibial component and the proximal tibia was prepared to modular drill and keel punch for the size 5.  Femoral sizing guide was placed and size 5 was most appropriate on the femur.  Rotations marked at the epicondylar axis.  The size 5 cutting block was placed and the anterior-posterior and chamfer cuts were made. Intercondylar block was placed and that cut was made.  The trial size 5 posterior stabilized femur was placed.  A 10 mm posterior stabilized rotating platform insert trial was placed.  With the 10, full extension was achieved with excellent varus-valgus and anterior-posterior balance throughout full range of motion.  The  patella was everted, thickness measured to be 26 mm.  Freehand resection was taken to 14 mm, 41 template was placed, lug holes were drilled, trial patella was placed and it tracks normally.  Osteophytes removed from the posterior femur with the trial in place.  All trials were removed and the cut bone surfaces prepared with pulsatile lavage.  Cement was mixed and once ready for implantation, the size 5 mobile bearing tibial tray, size 5 posterior stabilized femur, and 41 patella were cemented into place and patella was held with a clamp.  Trial 10-mm inserts placed, knee held in full extension, all  extruded cement removed.  When the cement was fully hardened, then the permanent 10 mm posterior stabilized rotating platform insert was placed.  The wounds copiously irrigated with saline solution and the arthrotomy closed over Hemovac drain with interrupted #1 PDS.  Flexion against gravity to 135 degrees.  Patella tracks normally.  Tourniquet was released, total time of 37 minutes. Subcutaneous was closed with interrupted 2-0 Vicryl and subcuticular running 4-0 Monocryl.  Catheter for the Marcaine pain pump was placed and the pump was initiated.  Incisions cleaned and dried and Steri- Strips and a bulky sterile dressing were applied.  He was then placed into a knee immobilizer, awakened and transported to Recovery in stable condition.     Ollen Gross, M.D.     FA/MEDQ  D:  06/20/2011  T:  06/20/2011  Job:  [id number]  Electronically Signed by Ollen Gross M.D. on 06/27/2011 10:33:45 AM

## 2011-07-11 NOTE — H&P (Signed)
NAME:  Louis [name], [name]:  [id number]  MEDICAL RECORD NO.:  [id number]  LOCATION:                                 FACILITY:  PHYSICIAN:  Ollen Gross, M.D.    DATE OF BIRTH:  [birth year]  DATE OF ADMISSION: DATE OF DISCHARGE:                             HISTORY & PHYSICAL   DATE OF ADMISSION:  June 20, 2011  CHIEF COMPLAINT:  Right knee pain.  HISTORY OF PRESENT ILLNESS:  The patient is a 75 year old male who has been seen by Dr. Lequita Halt for ongoing right knee pain.  He has known end- stage arthritis.  He has pain in both knees and has had the left knee replaced, but continues to have arthritis and progressive pain in the right knee.  He was found to have significant medial patellofemoral arthritis.  His pain has been progressively getting worse and has impacted his function and mobility.  It is felt he would benefit from undergoing surgical intervention.  Risks and benefits have been discussed.  He would like to proceed with surgery.  He recently had a tooth extraction by Dr. Jordan Hawks and in preparation for his upcoming procedure.  ALLERGIES:  SHELLFISH.  CURRENT MEDICATIONS: 1. Metformin 500 mg twice a day. 2. Lisinopril 20 mg daily. 3. Levoxyl 125 mcg daily. 4. Alprazolam 0.5 mg 2 tablets twice a day p.r.n. 5. Prilosec 20 mg daily. 6. Hydrocodone 10/325 as needed for pain. 7. Aspirin 81 mg daily. 8. Multivitamin daily. 9. Crestor 10 mg half tablet every other day. 10.Antihistamine 25 mg as needed. 11.Folic acid 1 mg daily. 12.Methotrexate 2.5 mg 6 tablets once a week. 13.Glimepiride 2 mg 1/2 tablet daily. 14.Calcium 600 mg twice a day. 15.Vitamin D 400 international units twice a day.  PAST MEDICAL HISTORY: 1. Impaired vision. 2. Impaired hearing, deafness in left ear. 3. Tinnitus. 4. Anxiety. 5. Past history of pneumonia. 6. Hypercholesterolemia. 7. Hiatal hernia. 8. Reflux disease 9.  Hemorrhoids. 10.Diverticulosis. 11.Hypothyroidism. 12.Non-insulin-dependent diabetes mellitus.  CHILDHOOD ILLNESSES:  Measles.  PAST SURGICAL HISTORY:  Right rotator cuff repair x3 in September 1998, January 1999, and October 1999, left eye cataract in January 2005, right eye cataract in February 2005.  He has had laser surgery on the right eye in June 2005 and again on the left eye in June 2005 shoulder replacement, rotator cuff surgery in August 2008, retinal surgery in January 2010, teeth extraction in August 2010, root canal in September 2010, teeth extraction again in October 2010, left total knee replacement on August 08, 2009, right shoulder replacement recently by Dr. Malon Kindle on March 29, 2011.  FAMILY HISTORY:  Father with heart attack.  Mother with heart valve.  SOCIAL HISTORY:  Married, retired from [employer], quit smoking about 20 years ago.  No alcohol.  He does have a caregiver lined up, has 2 steps entering his home, does have a living will and a healthcare power of attorney.  REVIEW OF SYSTEMS:  GENERAL:  No fevers, chills, night sweats.  NEURO: Little bit of hearing loss and ringing tinnitus in his ears. RESPIRATORY:  No shortness of breath, productive cough, or hemoptysis. CARDIOVASCULAR:  No chest pain or orthopnea.  GI:  No nausea, vomiting, diarrhea, or constipation.  GU:  No dysuria, hematuria, or discharge. MUSCULOSKELETAL:  Knee pain.  PHYSICAL EXAMINATION:  VITAL SIGNS:  Pulse 84, respirations 12, blood pressure 142/86. GENERAL:  A 75 year old white male well nourished, well developed, no acute distress.  He is alert, oriented and cooperative, good historian, is accompanied by his wife. HEENT:  Normocephalic, atraumatic.  Pupils round and reactive.  EOMs intact.  Does have upper and lower denture plates. CHEST:  Clear. HEART:  Regular rate and rhythm without murmur.  S1, S2 noted. ABDOMEN:  Soft, nontender, slightly round.  Bowel sounds  present. RECTAL, BREASTS, GENITALIA:  Not done, not part of present illness. EXTREMITIES:  Right knee range of motion 0-120.  No instability.  No effusion.  IMPRESSION:  Osteoarthritis, right knee.  PLAN:  The patient admitted to the [facility] to undergo right total knee replacement arthroplasty.  Surgery will be performed by Dr. Ollen Gross.     Alexzandrew L. Julien Girt, P.A.C.   ______________________________ Ollen Gross, M.D.    ALP/MEDQ  D:  06/21/2011  T:  06/21/2011  Job:  [id number]  cc:   Titus Dubin. Alwyn Ren, MD,FACP,FCCP [phone] [place] [address]  Almedia Balls. Ranell Patrick, M.D. Fax: [phone]  Alma Friendly, Fax:  [phone], DDS [address] [place], [place] [address]  Electronically Signed by Patrica Duel P.A.C. on 06/28/2011 11:17:25 AM Electronically Signed by Ollen Gross M.D. on 07/11/2011 11:17:30 AM

## 2011-07-13 LAB — BASIC METABOLIC PANEL
CO2: 27
CO2: 31
Calcium: 8.1 — ABNORMAL LOW
Calcium: 8.6
Creatinine, Ser: 0.85
GFR calc Af Amer: >60
GFR calc Af Amer: >60
GFR calc non Af Amer: >60
Glucose, Bld: 134 — ABNORMAL HIGH
Glucose, Bld: 149 — ABNORMAL HIGH
Potassium: 3.9
Sodium: 137
Sodium: 138

## 2011-07-13 LAB — URINE MICROSCOPIC-ADD ON

## 2011-07-13 LAB — URINALYSIS, ROUTINE W REFLEX MICROSCOPIC
Glucose, UA: NEGATIVE
Nitrite: NEGATIVE
Specific Gravity, Urine: 1.034 — ABNORMAL HIGH
pH: 5.5

## 2011-07-13 LAB — CBC
HCT: 36 — ABNORMAL LOW
Hemoglobin: 12.1 — ABNORMAL LOW
MCHC: 33.7
RDW: 15.1 — ABNORMAL HIGH

## 2011-07-13 NOTE — Discharge Summary (Signed)
NAME:  Louis [name], Louis [name] NO.:  [id number]  MEDICAL RECORD NO.:  [id number]  LOCATION:  1603                         FACILITY:  [facility]  PHYSICIAN:  Ollen Gross, M.D.    DATE OF BIRTH:  01-04-1936  DATE OF ADMISSION:  06/20/2011 DATE OF DISCHARGE:  06/23/2011                              DISCHARGE SUMMARY   ADMITTING DIAGNOSES: 1. Osteoarthritis, right knee. 2. Impaired vision. 3. Impaired hearing, deafness, left ear. 4. Tinnitus. 5. Anxiety. 6. Past history of pneumonia. 7. Hypercholesterolemia. 8. Hiatal hernia. 9. Reflux disease. 10.Hemorrhoids. 11.Diverticulosis. 12.Hypothyroidism. 13.Non-insulin-dependent diabetes mellitus.  DISCHARGE DIAGNOSES: 1. Osteoarthritis, right knee status post right total knee replacement     arthroplasty. 2. Postoperative acute blood loss anemia, did not require transfusion. 3. Impaired vision. 4. Impaired hearing, deafness, left ear. 5. Tinnitus. 6. Anxiety. 7. Past history of pneumonia. 8. Hypercholesterolemia. 9. Hiatal hernia. 10.Reflux disease. 11.Hemorrhoids. 12.Diverticulosis. 13.Hypothyroidism. 14.Non-insulin-dependent diabetes mellitus.  PROCEDURE:  June 20, 2011, right total knee.  Surgeon, Dr. Lequita Halt. Assistant, Patrica Duel, PA-C.  Spinal anesthesia.  Tourniquet time, 37 minutes.  CONSULTATIONS:  None.  BRIEF HISTORY:  Louis [name] is a 75 year old male with advanced end- stage arthritis of the right knee with progressively worsening pain and dysfunction, had successful left total knee, now presents for right total knee.  LABORATORY DATA:  Admission CBC is not scanned in the chart, but postop hemoglobin was 10.5, then down to 10.2.  Last noted hemoglobin and hematocrit of 9.7 and 29.8.  Chem panel on admission is not scanned into the chart, but serial BMETs were followed for 48 hours.  Electrolytes remained within normal limits.  Blood group type is B negative.  HOSPITAL COURSE:   The patient admitted to [facility], taken to OR, underwent the above-stated procedure without complication.  The patient tolerated the procedure well and later transferred to the recovery room on orthopedic floor, started on p.o. and IV analgesics for pain control following surgery.  Doing pretty well on the morning of day 1, had excellent urinary output, was started back on all of his home meds with the exception of the metformin that was held temporarily in the postoperative period until his diet and renal function were established.  He started getting up out of bed with therapy and actually doing pretty well on day 1 and into day 2, he was weaned over to p.o. meds.  We changed the dressing, incision looked good.  He continued with therapy and progressed fairly well until postop day 3, he was doing well, meeting his goals and discharged home.  DISCHARGE PLAN: 1. The patient was discharged home on June 23, 2011. 2. Discharge diagnoses:  Please see above. 3. Discharge meds:  Robaxin, OxyIR, Xarelto.  Continue Afrin,     alprazolam, Benadryl, Clear Eyes, Crestor, Amaryl, Levoxyl,     lisinopril, metformin, and Prilosec.  DIET:  Diabetic diet.  ACTIVITY:  Weightbearing as tolerated, total knee protocol.  Home health PT.  FOLLOWUP:  2 weeks.  DISPOSITION:  To home.  CONDITION UPON DISCHARGE:  Improved.     Alexzandrew L. Julien Girt, P.A.C.   ______________________________ Ollen Gross, M.D.    ALP/MEDQ  D:  07/06/2011  T:  07/07/2011  Job:  [id number]  cc:   Titus Dubin. Alwyn Ren, MD,FACP,FCCP [phone] [place] [address]  Almedia Balls. Ranell Patrick, M.D. Fax: [phone]  Dr. Jordan Hawks  Fax: [phone]  Electronically Signed by Patrica Duel P.A.C. on 07/13/2011 06:30:18 AM Electronically Signed by Ollen Gross M.D. on 07/13/2011 04:26:17 PM

## 2011-07-16 ENCOUNTER — Ambulatory Visit: Payer: Medicare Other | Attending: Orthopedic Surgery | Admitting: Physical Therapy

## 2011-07-16 DIAGNOSIS — IMO0001 Reserved for inherently not codable concepts without codable children: Secondary | ICD-10-CM | POA: Insufficient documentation

## 2011-07-16 DIAGNOSIS — M25669 Stiffness of unspecified knee, not elsewhere classified: Secondary | ICD-10-CM | POA: Insufficient documentation

## 2011-07-16 DIAGNOSIS — M25569 Pain in unspecified knee: Secondary | ICD-10-CM | POA: Insufficient documentation

## 2011-07-16 DIAGNOSIS — R262 Difficulty in walking, not elsewhere classified: Secondary | ICD-10-CM | POA: Insufficient documentation

## 2011-07-16 DIAGNOSIS — Z96659 Presence of unspecified artificial knee joint: Secondary | ICD-10-CM | POA: Insufficient documentation

## 2011-07-18 ENCOUNTER — Ambulatory Visit: Payer: Medicare Other | Admitting: Physical Therapy

## 2011-07-19 ENCOUNTER — Ambulatory Visit: Payer: Medicare Other | Admitting: Physical Therapy

## 2011-07-23 ENCOUNTER — Ambulatory Visit: Payer: Medicare Other | Admitting: Physical Therapy

## 2011-07-25 ENCOUNTER — Ambulatory Visit: Payer: Medicare Other | Admitting: Physical Therapy

## 2011-07-26 ENCOUNTER — Ambulatory Visit: Payer: Medicare Other | Admitting: Physical Therapy

## 2011-07-31 ENCOUNTER — Ambulatory Visit: Payer: Medicare Other | Admitting: Physical Therapy

## 2011-08-01 ENCOUNTER — Ambulatory Visit: Payer: Medicare Other | Admitting: Physical Therapy

## 2011-08-02 ENCOUNTER — Ambulatory Visit: Payer: Medicare Other | Attending: Orthopedic Surgery | Admitting: Physical Therapy

## 2011-08-02 DIAGNOSIS — IMO0001 Reserved for inherently not codable concepts without codable children: Secondary | ICD-10-CM | POA: Insufficient documentation

## 2011-08-02 DIAGNOSIS — M25669 Stiffness of unspecified knee, not elsewhere classified: Secondary | ICD-10-CM | POA: Insufficient documentation

## 2011-08-02 DIAGNOSIS — M25569 Pain in unspecified knee: Secondary | ICD-10-CM | POA: Insufficient documentation

## 2011-08-02 DIAGNOSIS — Z96659 Presence of unspecified artificial knee joint: Secondary | ICD-10-CM | POA: Insufficient documentation

## 2011-08-02 DIAGNOSIS — R262 Difficulty in walking, not elsewhere classified: Secondary | ICD-10-CM | POA: Insufficient documentation

## 2011-08-07 ENCOUNTER — Ambulatory Visit: Payer: Medicare Other | Admitting: Physical Therapy

## 2011-08-09 ENCOUNTER — Ambulatory Visit: Payer: Medicare Other | Admitting: Physical Therapy

## 2011-08-14 ENCOUNTER — Ambulatory Visit: Payer: Medicare Other | Admitting: Physical Therapy

## 2011-08-16 ENCOUNTER — Ambulatory Visit: Payer: Medicare Other | Admitting: Physical Therapy

## 2011-08-27 ENCOUNTER — Encounter: Payer: Medicare Other | Admitting: Physical Therapy

## 2011-08-28 ENCOUNTER — Ambulatory Visit: Payer: Medicare Other | Admitting: Physical Therapy

## 2011-08-30 ENCOUNTER — Ambulatory Visit: Payer: Medicare Other | Admitting: Physical Therapy

## 2011-09-04 ENCOUNTER — Ambulatory Visit: Payer: Medicare Other | Admitting: Physical Therapy

## 2011-09-06 ENCOUNTER — Ambulatory Visit: Payer: Medicare Other | Admitting: Physical Therapy

## 2011-09-12 ENCOUNTER — Other Ambulatory Visit: Payer: Self-pay | Admitting: Internal Medicine

## 2011-09-17 ENCOUNTER — Other Ambulatory Visit: Payer: Self-pay | Admitting: Internal Medicine

## 2011-09-18 MED ORDER — ALPRAZOLAM 0.5 MG PO TABS: ORAL_TABLET | ORAL | Status: DC

## 2011-09-18 NOTE — Telephone Encounter (Signed)
RX called in .

## 2011-10-01 ENCOUNTER — Other Ambulatory Visit: Payer: Self-pay | Admitting: Internal Medicine

## 2011-10-04 DIAGNOSIS — M1710 Unilateral primary osteoarthritis, unspecified knee: Secondary | ICD-10-CM | POA: Diagnosis not present

## 2011-10-29 DIAGNOSIS — M25549 Pain in joints of unspecified hand: Secondary | ICD-10-CM | POA: Diagnosis not present

## 2011-10-29 DIAGNOSIS — M159 Polyosteoarthritis, unspecified: Secondary | ICD-10-CM | POA: Diagnosis not present

## 2011-10-29 DIAGNOSIS — M069 Rheumatoid arthritis, unspecified: Secondary | ICD-10-CM | POA: Diagnosis not present

## 2011-11-01 DIAGNOSIS — M25519 Pain in unspecified shoulder: Secondary | ICD-10-CM | POA: Diagnosis not present

## 2011-11-01 DIAGNOSIS — M19019 Primary osteoarthritis, unspecified shoulder: Secondary | ICD-10-CM | POA: Diagnosis not present

## 2011-11-26 DIAGNOSIS — M545 Low back pain: Secondary | ICD-10-CM | POA: Diagnosis not present

## 2011-12-04 ENCOUNTER — Other Ambulatory Visit: Payer: Self-pay | Admitting: Internal Medicine

## 2011-12-04 DIAGNOSIS — M1710 Unilateral primary osteoarthritis, unspecified knee: Secondary | ICD-10-CM | POA: Diagnosis not present

## 2011-12-05 NOTE — Telephone Encounter (Signed)
Prescription sent to pharmacy.

## 2011-12-12 DIAGNOSIS — M545 Low back pain: Secondary | ICD-10-CM | POA: Diagnosis not present

## 2012-01-29 DIAGNOSIS — M545 Low back pain: Secondary | ICD-10-CM | POA: Diagnosis not present

## 2012-02-05 DIAGNOSIS — H353290 Exudative age-related macular degeneration, unspecified eye, stage unspecified: Secondary | ICD-10-CM | POA: Diagnosis not present

## 2012-02-12 DIAGNOSIS — M545 Low back pain: Secondary | ICD-10-CM | POA: Diagnosis not present

## 2012-02-26 DIAGNOSIS — E119 Type 2 diabetes mellitus without complications: Secondary | ICD-10-CM | POA: Diagnosis not present

## 2012-02-26 DIAGNOSIS — H353190 Nonexudative age-related macular degeneration, unspecified eye, stage unspecified: Secondary | ICD-10-CM | POA: Diagnosis not present

## 2012-02-27 DIAGNOSIS — M069 Rheumatoid arthritis, unspecified: Secondary | ICD-10-CM | POA: Diagnosis not present

## 2012-02-27 DIAGNOSIS — M949 Disorder of cartilage, unspecified: Secondary | ICD-10-CM | POA: Diagnosis not present

## 2012-02-27 DIAGNOSIS — M159 Polyosteoarthritis, unspecified: Secondary | ICD-10-CM | POA: Diagnosis not present

## 2012-02-27 DIAGNOSIS — M899 Disorder of bone, unspecified: Secondary | ICD-10-CM | POA: Diagnosis not present

## 2012-03-03 ENCOUNTER — Telehealth: Payer: Self-pay | Admitting: Internal Medicine

## 2012-03-03 NOTE — Telephone Encounter (Signed)
Dr Lazarus Salines, ENT

## 2012-03-03 NOTE — Telephone Encounter (Signed)
Dr.Hopper please advise 

## 2012-03-03 NOTE — Telephone Encounter (Signed)
Patient states he has spoken with Dr. Alwyn Ren regarding ENT referral in the past. He has lost the name of the doctor and would like to know who Dr. Alwyn Ren recommends. Does not need referral.

## 2012-03-03 NOTE — Telephone Encounter (Signed)
Discuss with patient  

## 2012-03-13 ENCOUNTER — Telehealth: Payer: Self-pay | Admitting: *Deleted

## 2012-03-13 MED ORDER — ROSUVASTATIN CALCIUM 10 MG PO TABS: 10.0000 mg | ORAL_TABLET | Freq: Every day | ORAL | Status: DC

## 2012-03-13 MED ORDER — ALPRAZOLAM 0.5 MG PO TABS: ORAL_TABLET | ORAL | Status: DC

## 2012-03-13 MED ORDER — LISINOPRIL 20 MG PO TABS: 20.0000 mg | ORAL_TABLET | Freq: Every day | ORAL | Status: DC

## 2012-03-13 NOTE — Telephone Encounter (Signed)
Refills left on pharmacy voicemail and notified pt's wife of completion and ok to wait until CPE to have labs drawn.

## 2012-03-13 NOTE — Telephone Encounter (Signed)
Pt requests 30 day supply of: Crestor last written 05/10/11 #90 x 1 rf  lisinopril last filled 12/11/11 #90 and  Alprazolam last filled 09/18/11 #90. Is it ok to send 30 days supply? Pt has CPE scheduled for 04/15/12. Does he need labs prior to appt?

## 2012-03-13 NOTE — Telephone Encounter (Signed)
Labs can await CPX; refill meds X 90 days

## 2012-04-07 DIAGNOSIS — L821 Other seborrheic keratosis: Secondary | ICD-10-CM | POA: Diagnosis not present

## 2012-04-07 DIAGNOSIS — L570 Actinic keratosis: Secondary | ICD-10-CM | POA: Diagnosis not present

## 2012-04-15 ENCOUNTER — Ambulatory Visit (INDEPENDENT_AMBULATORY_CARE_PROVIDER_SITE_OTHER): Payer: Medicare Other | Admitting: Internal Medicine

## 2012-04-15 ENCOUNTER — Encounter: Payer: Self-pay | Admitting: Internal Medicine

## 2012-04-15 VITALS — BP 132/80 | HR 80 | Temp 98.6°F | Resp 12 | Wt 243.4 lb

## 2012-04-15 DIAGNOSIS — E039 Hypothyroidism, unspecified: Secondary | ICD-10-CM | POA: Diagnosis not present

## 2012-04-15 DIAGNOSIS — E782 Mixed hyperlipidemia: Secondary | ICD-10-CM | POA: Diagnosis not present

## 2012-04-15 DIAGNOSIS — E119 Type 2 diabetes mellitus without complications: Secondary | ICD-10-CM | POA: Diagnosis not present

## 2012-04-15 DIAGNOSIS — Z23 Encounter for immunization: Secondary | ICD-10-CM

## 2012-04-15 DIAGNOSIS — I10 Essential (primary) hypertension: Secondary | ICD-10-CM | POA: Diagnosis not present

## 2012-04-15 DIAGNOSIS — Z0000 Encounter for general adult medical examination without abnormal findings: Secondary | ICD-10-CM | POA: Diagnosis not present

## 2012-04-15 DIAGNOSIS — D126 Benign neoplasm of colon, unspecified: Secondary | ICD-10-CM

## 2012-04-15 LAB — BUN: BUN: 20 mg/dL (ref 6–23)

## 2012-04-15 LAB — LIPID PANEL
HDL: 45.5 mg/dL (ref >39.00)
Total CHOL/HDL Ratio: 4
Triglycerides: 218 mg/dL — ABNORMAL HIGH (ref 0.0–149.0)

## 2012-04-15 LAB — MICROALBUMIN / CREATININE URINE RATIO: Microalb Creat Ratio: 1.7 mg/g (ref 0.0–30.0)

## 2012-04-15 MED ORDER — ALPRAZOLAM 0.5 MG PO TABS: ORAL_TABLET | ORAL | Status: DC

## 2012-04-15 NOTE — Patient Instructions (Addendum)
Please try to go on My Chart within the next 24 hours to allow me to release the results directly to you .Share results with all MDs seen. To prevent palpitations or premature beats, avoid stimulants such as decongestants, diet pills, nicotine, or caffeine (coffee, tea, cola, or chocolate) to excess.

## 2012-04-15 NOTE — Progress Notes (Signed)
Subjective:    Patient ID: Louis [name], male    DOB: 1936/08/12, 76 y.o.   MRN: [id number]  HPI Medicare Wellness Visit:  The following psychosocial & medical history were reviewed as required by Medicare.   Social history: caffeine: none , alcohol:  no ,  tobacco use : quit 1982  & exercise : limited by spinal stenosis & multiple joint surgeries.   Home & personal  safety / fall risk: no issues, activities of daily living: no limitations , seatbelt use : yes , and smoke alarm employment : yes .  Power of Attorney/Living Will status : in place  Vision ( as recorded per Nurse) & Hearing  evaluation :  Ophth exam < 1 mo ago; Audiology evaluation to be scheduled. Orientation :oriented X 3 , memory & recall :good,  math testing: good,and mood & affect : normal . Depression / anxiety: denied Travel history : 2004 [place] , immunization status : Shingles needed , transfusion history:  no, and preventive health surveillance ( colonoscopies, BMD , etc as per protocol/ [facility]): colonoscopy ? due, Dental care:  Seen regularly ("39 X in 2012") . Chart reviewed &  Updated. Active issues reviewed & addressed.       Review of Systems HYPERTENSION: Disease Monitoring: Blood pressure range-< 140/80  Chest pain, palpitations- no       Dyspnea- no Medications: Compliance- no  Lightheadedness,Syncope- no   Edema- no  DIABETES: Disease Monitoring: Blood Sugar ranges-101-135  Polyuria/phagia/dipsia- no ; but "major dry mouth"      Visual problems- no Medications: Compliance-yes Hypoglycemic symptoms- no  HYPERLIPIDEMIA: Disease Monitoring: See symptoms for Hypertension Medications: Compliance- yes; 1/2 day of Crestor 10 mg  Abd pain, bowel changes-intermittent RUQ soreness with local pressure    Muscle aches- no           Objective:   Physical Exam Gen.:  well-nourished in appearance. Alert, appropriate and cooperative throughout exam. Head: Normocephalic without obvious  abnormalities  Eyes: No corneal or conjunctival inflammation noted. Pupils equal round reactive to light and accommodation. Fundal exam is benign without hemorrhages, exudate, papilledema. Extraocular motion intact. Vision grossly decreased OS. Ears: External  ear exam reveals no significant lesions or deformities. R TM scarred; wax L canal. Hearing is grossly decreased bilaterally. Nose: External nasal exam reveals no deformity or inflammation. Nasal mucosa are pink and moist. No lesions or exudates noted.   Mouth: Oral mucosa and oropharynx reveal no lesions or exudates. Teeth in good repair. Neck: No deformities, masses, or tenderness noted. Range of motion & Thyroid normal Lungs: Normal respiratory effort; chest expands symmetrically. Lungs are clear to auscultation without rales, wheezes, or increased work of breathing. Heart: Normal rate and rhythm. Normal S1 and S2. No gallop, click, or rub. Grade 1/2 over 6 systolic murmur . Abdomen: Bowel sounds normal; abdomen soft and nontender. No masses, organomegaly or hernias noted. Genitalia/ DRE: Genitalia normal . Prostate is normal without enlargement, asymmetry, nodularity, or induration.  Musculoskeletal/extremities: No deformity or scoliosis noted of  the thoracic or lumbar spine. No clubbing, cyanosis, edema, or deformity noted. Decreased ROM of knees .Tone & strength  normal. Nail health  good. Vascular: Carotid, radial artery, dorsalis pedis and  posterior tibial pulses are full and equal. No bruits present. Neurologic: Alert and oriented x3. Deep tendon reflexes symmetrical but 0+ @ knees         Skin: Intact without suspicious lesions or rashes. Lymph: No cervical, axillary, or inguinal lymphadenopathy present. Psych: Mood  and affect are normal. Normally interactive                                                                                         Assessment & Plan:  #1 Medicare Wellness Exam; criteria met ; data entered #2  Problem List reviewed ; Assessment/ Recommendations made See EKG :single premature ,otherwise normal Plan: see Orders

## 2012-04-16 LAB — LDL CHOLESTEROL, DIRECT: Direct LDL: 103.2 mg/dL

## 2012-04-16 LAB — TSH: TSH: 0.51 u[IU]/mL (ref 0.35–5.50)

## 2012-04-28 DIAGNOSIS — M069 Rheumatoid arthritis, unspecified: Secondary | ICD-10-CM | POA: Diagnosis not present

## 2012-05-01 DIAGNOSIS — M545 Low back pain: Secondary | ICD-10-CM | POA: Diagnosis not present

## 2012-05-06 ENCOUNTER — Encounter: Payer: Medicare Other | Admitting: Internal Medicine

## 2012-05-20 ENCOUNTER — Other Ambulatory Visit: Payer: Self-pay | Admitting: Internal Medicine

## 2012-05-20 NOTE — Telephone Encounter (Signed)
Refill done.  

## 2012-05-23 ENCOUNTER — Ambulatory Visit (INDEPENDENT_AMBULATORY_CARE_PROVIDER_SITE_OTHER): Payer: Medicare Other | Admitting: Internal Medicine

## 2012-05-23 ENCOUNTER — Encounter: Payer: Self-pay | Admitting: Internal Medicine

## 2012-05-23 VITALS — BP 132/80 | HR 91 | Temp 98.4°F | Wt 243.0 lb

## 2012-05-23 DIAGNOSIS — E119 Type 2 diabetes mellitus without complications: Secondary | ICD-10-CM

## 2012-05-23 DIAGNOSIS — K219 Gastro-esophageal reflux disease without esophagitis: Secondary | ICD-10-CM | POA: Diagnosis not present

## 2012-05-23 DIAGNOSIS — E785 Hyperlipidemia, unspecified: Secondary | ICD-10-CM

## 2012-05-23 DIAGNOSIS — R079 Chest pain, unspecified: Secondary | ICD-10-CM

## 2012-05-23 MED ORDER — ROSUVASTATIN CALCIUM 10 MG PO TABS: 10.0000 mg | ORAL_TABLET | Freq: Every day | ORAL | Status: DC

## 2012-05-23 NOTE — Patient Instructions (Addendum)
Review and correct the record as indicated. Please share record with all medical staff seen.   If you activate My Chart; the results can be released to you as soon as they populate from the lab. If you choose not to use this program; the labs have to be reviewed, copied & mailed   causing a delay in getting the results to you.  

## 2012-05-23 NOTE — Addendum Note (Signed)
Addended by: Maurice Small on: 05/23/2012 12:29 PM   Modules accepted: Orders

## 2012-05-23 NOTE — Progress Notes (Signed)
  Subjective:    Patient ID: Louis [name], male    DOB: Sep 29, 1936, 76 y.o.   MRN: [id number]  HPI 2 weeks ago he had instantaneous sharp substernal chest pain [place]. This resolved without treatment. 2 days later he noted some heaviness in his chest while watching tv; this resolved as he chewed 4 aspirin. When he does yard work he will have shortness of breath and fatigue.  His exercise is limited as he has had bilateral total knee replacements.  There is no family history of premature heart disease. His father had heart attack following a stroke at 53. One paternal aunt had heart attack; she was over 98.      Review of Systems  He denies exertional chest pain, palpitations, claudication, or paroxysmal nocturnal dyspnea.  He denies cough, sputum productionor, hemoptysis. He does not have significant dysphagia, melena, rectal bleeding. Prilosec taken daily controls his reflux symptoms. On one occasion he had a brief sensation of "ice water" in the right lower quadrant"; this was self-limited w/o treatment His diabetic control has been adequate;FBS range 105-110 & 2 hr after a meal < 130.His last A1c was 7.3%. His most recent LDL was 103 on Crestor 10 mg one half pill daily.      Objective:   Physical Exam General appearance is one of good health and nourishment w/o distress.  Eyes: No conjunctival inflammation or scleral icterus is present.  Oral exam: Dental hygiene is good; lips and gums are healthy appearing.There is no oropharyngeal erythema or exudate noted.   Heart:  Normal rate and regular rhythm. S1 and S2 normal without gallop,  click, rub or other extra sounds  .Grade 1/6 systolic murmur    Lungs:Chest clear to auscultation; no wheezes, rhonchi,rales ,or rubs present.No increased work of breathing.   Abdomen: bowel sounds normal, soft and non-tender without masses, organomegaly or hernias noted.  No guarding or rebound   Skin:Warm  & dry.  Intact without suspicious lesions or rashes ; no jaundice or tenting  Lymphatic: No lymphadenopathy is noted about the head, neck, axilla areas.   Extremities: No cyanosis, clubbing, or edema. Homans sign is negative              Assessment & Plan:  #1 atypical chest pain at rest x 2. EKG is normal. Stress test indicated  #2 history of GERD  #3 degenerative joint disease of the knees, status post total knee replacement. This would preclude treadmill stress test  #4 diabetes  #5 dyslipidemia; his LDL goal should be less than 70.

## 2012-06-09 ENCOUNTER — Ambulatory Visit (HOSPITAL_COMMUNITY): Payer: Medicare Other | Attending: Internal Medicine | Admitting: Radiology

## 2012-06-09 VITALS — Ht 72.0 in | Wt 239.0 lb

## 2012-06-09 DIAGNOSIS — R079 Chest pain, unspecified: Secondary | ICD-10-CM | POA: Diagnosis not present

## 2012-06-09 DIAGNOSIS — Z87891 Personal history of nicotine dependence: Secondary | ICD-10-CM | POA: Insufficient documentation

## 2012-06-09 DIAGNOSIS — R0609 Other forms of dyspnea: Secondary | ICD-10-CM | POA: Diagnosis not present

## 2012-06-09 DIAGNOSIS — E119 Type 2 diabetes mellitus without complications: Secondary | ICD-10-CM | POA: Insufficient documentation

## 2012-06-09 DIAGNOSIS — Z8249 Family history of ischemic heart disease and other diseases of the circulatory system: Secondary | ICD-10-CM | POA: Diagnosis not present

## 2012-06-09 DIAGNOSIS — R0602 Shortness of breath: Secondary | ICD-10-CM | POA: Diagnosis not present

## 2012-06-09 DIAGNOSIS — I10 Essential (primary) hypertension: Secondary | ICD-10-CM | POA: Insufficient documentation

## 2012-06-09 DIAGNOSIS — R42 Dizziness and giddiness: Secondary | ICD-10-CM | POA: Diagnosis not present

## 2012-06-09 DIAGNOSIS — R0989 Other specified symptoms and signs involving the circulatory and respiratory systems: Secondary | ICD-10-CM | POA: Insufficient documentation

## 2012-06-09 MED ORDER — REGADENOSON 0.4 MG/5ML IV SOLN
0.4000 mg | Freq: Once | INTRAVENOUS | Status: AC
  Administered 2012-06-09: 0.4 mg via INTRAVENOUS

## 2012-06-09 MED ORDER — AMINOPHYLLINE 25 MG/ML IV SOLN
75.0000 mg | Freq: Once | INTRAVENOUS | Status: AC
  Administered 2012-06-09: 75 mg via INTRAVENOUS

## 2012-06-09 MED ORDER — TECHNETIUM TC 99M SESTAMIBI GENERIC - CARDIOLITE
11.0000 | Freq: Once | INTRAVENOUS | Status: AC | PRN
  Administered 2012-06-09: 11 via INTRAVENOUS

## 2012-06-09 MED ORDER — TECHNETIUM TC 99M SESTAMIBI GENERIC - CARDIOLITE
33.0000 | Freq: Once | INTRAVENOUS | Status: AC | PRN
  Administered 2012-06-09: 33 via INTRAVENOUS

## 2012-06-09 NOTE — Progress Notes (Signed)
[facility] [address] NUCLEAR MED [address] [place] [address] [phone]  Cardiology Nuclear Med Study  Louis [name] is a 76 y.o. male     MRN : [id number]     DOB: 03-01-1936  Procedure Date: 06/09/2012  Nuclear Med Background Indication for Stress Test:  Evaluation for Ischemia History: No prior known history of CAD,'93 Myocardial Perfusion Study-NL, 30 years ago Heart Catheterization-Normal per patient Cardiac Risk Factors: Family History - CAD, History of Smoking, Hypertension, Lipids and NIDDM  Symptoms:Chest Pain without exertion (last occurrence 3 weeks ago),   Dizziness, DOE, Fatigue, Fatigue with Exertion and Light-Headedness   Nuclear Pre-Procedure Caffeine/Decaff Intake:  7:30pm NPO After: 7:00am   Lungs:  clear O2 Sat: 98% on room air. IV 0.9% NS with Angio Cath:  20g  IV Site: R Hand  IV Started by:  Cathlyn Parsons, RN  Chest Size (in):  46 Cup Size: n/a  Height: 6' (1.829 m)  Weight:  239 lb (108.41 kg)  BMI:  Body mass index is 32.41 kg/(m^2). Tech Comments: Dizziness improved after caffeinated coffee but still persist after images. Aminophylline 75mg  IVP given with quick resolution of dizziness.Irean Hong, RN    Nuclear Med Study 1 or 2 day study: 1 day  Stress Test Type:  Treadmill/Lexiscan  Reading MD: Dietrich Pates, MD  Order Authorizing Provider:  Oren Bracket  Resting Radionuclide: Technetium 3m Sestamibi  Resting Radionuclide Dose: 11.0 mCi   Stress Radionuclide:  Technetium 44m Sestamibi  Stress Radionuclide Dose: 33.0 mCi           Stress Protocol Rest HR: 66 Stress HR: 112  Rest BP: 131/71 Stress BP: 165/63  Exercise Time (min): 2:00 METS: n/a   Predicted Max HR: 146 bpm % Max HR: 76.71 bpm Rate Pressure Product: 21308   Dose of Adenosine (mg):  n/a Dose of Lexiscan: 0.4 mg  Dose of Atropine (mg): n/a Dose of Dobutamine: n/a mcg/kg/min (at max HR)  Stress Test Technologist: Irean Hong, RN  Nuclear  Technologist:  Domenic Polite, CNMT     Rest Procedure:  Myocardial perfusion imaging was performed at rest 45 minutes following the intravenous administration of Technetium 88m Sestamibi. Rest ECG:NSR  Stress Procedure:  The patient received IV Lexiscan 0.4 mg over 15-seconds with concurrent low level exercise and then Technetium 77m Sestamibi was injected at 30-seconds while the patient continued walking one more minute. There were no significant changes with Lexiscan. The patient complained of chest fullness relieved with belching. Quantitative spect images were obtained after a 45-minute delay. Stress ECG: No significant change from baseline ECG  QPS Raw Data Images:  Images were motion corrected.  Soft tissue (diaphragm, bowel) underlies heart. Stress Images:  Normal homogeneous uptake in all areas of the myocardium. Rest Images:  Normal homogeneous uptake in all areas of the myocardium. Subtraction (SDS):  No evidence of ischemia. Transient Ischemic Dilatation (Normal <1.22):  1.09 Lung/Heart Ratio (Normal <0.45):  0.32  Quantitative Gated Spect Images QGS EDV:  104 ml QGS ESV:  34 ml  Impression Exercise Capacity:  Lexiscan with low level exercise. BP Response:  Normal blood pressure response. Clinical Symptoms:  No chest pain. ECG Impression:  No significant ST segment change suggestive of ischemia. Comparison with Prior Nuclear Study: No change from previous report.  Overall Impression:  Normal stress nuclear study.  LV Ejection Fraction: 67%.  LV Wall Motion:  NL LV Function; NL Wall Motion  Dietrich Pates

## 2012-06-10 ENCOUNTER — Other Ambulatory Visit: Payer: Self-pay | Admitting: Internal Medicine

## 2012-06-10 DIAGNOSIS — M1710 Unilateral primary osteoarthritis, unspecified knee: Secondary | ICD-10-CM | POA: Diagnosis not present

## 2012-06-10 MED ORDER — ALPRAZOLAM 0.5 MG PO TABS: ORAL_TABLET | ORAL | Status: DC

## 2012-06-10 NOTE — Telephone Encounter (Signed)
RX called in .

## 2012-06-10 NOTE — Telephone Encounter (Signed)
Alprazolam 0.5 mg 90 qty Last refill--04/15/12 Take one (1) tablet every 8 hours only as needed.

## 2012-06-20 ENCOUNTER — Other Ambulatory Visit: Payer: Self-pay | Admitting: Internal Medicine

## 2012-06-20 MED ORDER — LISINOPRIL 20 MG PO TABS: 20.0000 mg | ORAL_TABLET | Freq: Every day | ORAL | Status: DC

## 2012-06-20 NOTE — Telephone Encounter (Signed)
refill lisinopril oral tablet 20mg  take one tablet daily #90 last fill 6.14.13--last ov 8.23.13 med mgmt

## 2012-06-24 ENCOUNTER — Other Ambulatory Visit: Payer: Self-pay | Admitting: Internal Medicine

## 2012-06-24 NOTE — Telephone Encounter (Signed)
Rx sent.    MW 

## 2012-06-26 DIAGNOSIS — M069 Rheumatoid arthritis, unspecified: Secondary | ICD-10-CM | POA: Diagnosis not present

## 2012-08-07 DIAGNOSIS — Z23 Encounter for immunization: Secondary | ICD-10-CM | POA: Diagnosis not present

## 2012-08-20 ENCOUNTER — Other Ambulatory Visit: Payer: Self-pay | Admitting: Internal Medicine

## 2012-08-20 DIAGNOSIS — E785 Hyperlipidemia, unspecified: Secondary | ICD-10-CM

## 2012-08-20 MED ORDER — ALPRAZOLAM 0.5 MG PO TABS: ORAL_TABLET | ORAL | Status: DC

## 2012-08-20 NOTE — Telephone Encounter (Signed)
refill ALPRAZolam (Tab) 0.5 MG Take one (1) tablet(s) every eight (8) hours as needed only last fill 9.10.13 (#90)

## 2012-08-21 DIAGNOSIS — M1710 Unilateral primary osteoarthritis, unspecified knee: Secondary | ICD-10-CM | POA: Diagnosis not present

## 2012-09-05 DIAGNOSIS — M5137 Other intervertebral disc degeneration, lumbosacral region: Secondary | ICD-10-CM | POA: Diagnosis not present

## 2012-09-05 DIAGNOSIS — M47817 Spondylosis without myelopathy or radiculopathy, lumbosacral region: Secondary | ICD-10-CM | POA: Diagnosis not present

## 2012-09-15 ENCOUNTER — Encounter: Payer: Self-pay | Admitting: Lab

## 2012-09-16 ENCOUNTER — Encounter: Payer: Self-pay | Admitting: Internal Medicine

## 2012-09-16 ENCOUNTER — Ambulatory Visit (HOSPITAL_BASED_OUTPATIENT_CLINIC_OR_DEPARTMENT_OTHER)
Admission: RE | Admit: 2012-09-16 | Discharge: 2012-09-16 | Disposition: A | Discharge status: Home / Self Care | Payer: Medicare Other | Source: Ambulatory Visit | Attending: Internal Medicine | Admitting: Internal Medicine

## 2012-09-16 ENCOUNTER — Ambulatory Visit (INDEPENDENT_AMBULATORY_CARE_PROVIDER_SITE_OTHER): Payer: Medicare Other | Admitting: Internal Medicine

## 2012-09-16 VITALS — BP 124/78 | HR 115 | Temp 98.8°F | Wt 239.0 lb

## 2012-09-16 DIAGNOSIS — R071 Chest pain on breathing: Secondary | ICD-10-CM | POA: Insufficient documentation

## 2012-09-16 DIAGNOSIS — J209 Acute bronchitis, unspecified: Secondary | ICD-10-CM

## 2012-09-16 DIAGNOSIS — R042 Hemoptysis: Secondary | ICD-10-CM

## 2012-09-16 DIAGNOSIS — R918 Other nonspecific abnormal finding of lung field: Secondary | ICD-10-CM | POA: Diagnosis not present

## 2012-09-16 DIAGNOSIS — R0781 Pleurodynia: Secondary | ICD-10-CM

## 2012-09-16 MED ORDER — AZITHROMYCIN 250 MG PO TABS: ORAL_TABLET | ORAL | Status: DC

## 2012-09-16 MED ORDER — HYDROCODONE-HOMATROPINE 5-1.5 MG/5ML PO SYRP: 5.0000 mL | ORAL_SOLUTION | Freq: Four times a day (QID) | ORAL | Status: DC | PRN

## 2012-09-16 NOTE — Progress Notes (Signed)
  Subjective:    Patient ID: Louis [name], male    DOB: Mar 20, 1936, 76 y.o.   MRN: [id number]  HPI  Symptoms began 09/13/12 approximately 3 AM is right-sided chest pain which appeared to be aggravated by change in position. That day  there was residual soreness which lasted for hours. It does seem to be worse when he would reach out with the right upper extremity. Subsequently it was worse with deep inspiration or the recurrent coughing. He had no symptoms on the left.  The cough was productive of green sputum with some traces of blood. That has improved over the last 24 hours.  He's had associated shortness of breath.  He is not taking any medication for the symptoms except for Benadryl for the postnasal drainage      Review of Systems  He denies fever, chills, or sweats. He's had no associated frontal headache, facial painor nasal purulence. The cough is not been associated with wheezing.     Objective:   Physical Exam General appearance:good health ;well nourished; no acute distress or increased work of breathing is present.  No  lymphadenopathy about the head, neck, or axilla noted.   Eyes: No conjunctival inflammation or lid edema is present.   Ears:  External ear exam shows no significant lesions or deformities.  Otoscopic examination reveals clear canals, tympanic membranes are intact bilaterally with minor scarring; no bulging, retraction, inflammation or discharge.  Nose:  External nasal examination shows no deformity or inflammation. Nasal mucosa are pink and moist without lesions or exudates. No septal dislocation or deviation.No obstruction to airflow.   Oral exam: Upper & lower partials; lips and gums are healthy appearing.There is no oropharyngeal erythema or exudate noted.   Neck:  No deformities,  masses, or tenderness noted.     Heart:  Normal rate and regular rhythm. S1 and S2 normal without  murmur, click, rub or other extra sounds. S4 gallop  Lungs:Chest  clear to auscultation; no wheezes, rhonchi,rales ,or rubs present.No increased work of breathing.    Extremities:  No cyanosis, edema, or clubbing  noted . Homan's negative   Skin: Warm & dry w/o jaundice or tenting.          Assessment & Plan:  #1 right-sided pleuritic chest pain  #2 purulent sputum production suggesting acute bronchitis; rule out pneumonia  #3 hemoptysis, subjectively improving  Plan: See orders and recommendations

## 2012-09-16 NOTE — Patient Instructions (Addendum)
Order for x-rays entered into  the computer; these will be performed at [facility] on [address] No appointment is necessary.     If you activate My Chart; the results can be released to you as soon as they populate from the lab. If you choose not to use this program; the labs have to be reviewed, copied & mailed   causing a delay in getting the results to you.

## 2012-09-17 ENCOUNTER — Telehealth: Payer: Self-pay | Admitting: *Deleted

## 2012-09-17 NOTE — Telephone Encounter (Signed)
Pt wife left VM on triage @8 :05 am requesting a call back before 8:50 am to get result of x-ray. .Please advise

## 2012-09-17 NOTE — Telephone Encounter (Signed)
Possible right upper lobe pneumonia; antibiotic should eradicate this. A followup chest x-ray would be needed 09/25/12.

## 2012-09-17 NOTE — Telephone Encounter (Signed)
Left message with on voicemail with results, message left informing patient/wife that results were sent through MyChart for status indicates active. Patient to call and inform me of where he would like to get repeat xray

## 2012-09-18 ENCOUNTER — Other Ambulatory Visit: Payer: Self-pay | Admitting: Internal Medicine

## 2012-09-18 ENCOUNTER — Other Ambulatory Visit (INDEPENDENT_AMBULATORY_CARE_PROVIDER_SITE_OTHER): Payer: Medicare Other

## 2012-09-18 DIAGNOSIS — E785 Hyperlipidemia, unspecified: Secondary | ICD-10-CM | POA: Diagnosis not present

## 2012-09-18 DIAGNOSIS — IMO0001 Reserved for inherently not codable concepts without codable children: Secondary | ICD-10-CM

## 2012-09-18 DIAGNOSIS — J189 Pneumonia, unspecified organism: Secondary | ICD-10-CM

## 2012-09-18 LAB — LIPID PANEL
Cholesterol: 122 mg/dL (ref 0–200)
HDL: 38 mg/dL — ABNORMAL LOW (ref >39.00)
Total CHOL/HDL Ratio: 3
Triglycerides: 88 mg/dL (ref 0.0–149.0)

## 2012-09-18 LAB — HEMOGLOBIN A1C: Hgb A1c MFr Bld: 7.7 % — ABNORMAL HIGH (ref 4.6–6.5)

## 2012-09-18 LAB — MICROALBUMIN / CREATININE URINE RATIO: Microalb, Ur: 9.1 mg/dL — ABNORMAL HIGH (ref 0.0–1.9)

## 2012-09-18 MED ORDER — LEVOFLOXACIN 500 MG PO TABS: ORAL_TABLET | ORAL | Status: DC

## 2012-09-19 ENCOUNTER — Telehealth: Payer: Self-pay | Admitting: *Deleted

## 2012-09-19 MED ORDER — LISINOPRIL 20 MG PO TABS: ORAL_TABLET | ORAL | Status: DC

## 2012-09-19 MED ORDER — ROSUVASTATIN CALCIUM 10 MG PO TABS: 10.0000 mg | ORAL_TABLET | Freq: Every day | ORAL | Status: DC

## 2012-09-19 MED ORDER — ALPRAZOLAM 0.5 MG PO TABS: ORAL_TABLET | ORAL | Status: DC

## 2012-09-19 MED ORDER — METFORMIN HCL 500 MG PO TABS: ORAL_TABLET | ORAL | Status: DC

## 2012-09-19 MED ORDER — GLIMEPIRIDE 2 MG PO TABS: ORAL_TABLET | ORAL | Status: DC

## 2012-09-19 NOTE — Telephone Encounter (Signed)
Patient walked in, entire contents of message addressed

## 2012-09-19 NOTE — Telephone Encounter (Signed)
Patient was told: Medication to be filled once labs addressed   Hopp please advise on labs, medications can be filled accordingly

## 2012-09-19 NOTE — Telephone Encounter (Signed)
    Baldwin Jamaica, RN  Message   Pt called states he forgot to ask for refills for glybride and alprazolam at OV yesterday. He is leaving Monday morning early Monday am, can come by office at 4:30 today to pick these up.

## 2012-09-19 NOTE — Telephone Encounter (Signed)
Pt states that he came in to labs on yesterday morning and he gave Chrae a list of meds that she was suppose to call in. Pt indicated that he has yet to hear anything from her and he is suppose to be leaving to go out of town. Pt states that he forgot alprazolam which should be added to that list  Pt indicated he needed a Refill on the following (Crestor, amaryl, lisinorpil) he think, but per Pt Chrae knows what he needs. Pt using [facility].

## 2012-09-29 ENCOUNTER — Ambulatory Visit (HOSPITAL_BASED_OUTPATIENT_CLINIC_OR_DEPARTMENT_OTHER)
Admission: RE | Admit: 2012-09-29 | Discharge: 2012-09-29 | Disposition: A | Discharge status: Home / Self Care | Payer: Medicare Other | Source: Ambulatory Visit | Attending: Internal Medicine | Admitting: Internal Medicine

## 2012-09-29 ENCOUNTER — Other Ambulatory Visit: Payer: Self-pay | Admitting: Internal Medicine

## 2012-09-29 DIAGNOSIS — J984 Other disorders of lung: Secondary | ICD-10-CM | POA: Insufficient documentation

## 2012-09-29 DIAGNOSIS — J189 Pneumonia, unspecified organism: Secondary | ICD-10-CM

## 2012-09-30 ENCOUNTER — Telehealth: Payer: Self-pay | Admitting: Internal Medicine

## 2012-09-30 DIAGNOSIS — J189 Pneumonia, unspecified organism: Secondary | ICD-10-CM

## 2012-09-30 MED ORDER — LEVOFLOXACIN 500 MG PO TABS: ORAL_TABLET | ORAL | Status: DC

## 2012-09-30 NOTE — Telephone Encounter (Signed)
Spoke with patient's wife, patient completed last ABX 2 days ago. Per Dr.Hopper (sitting in front of me at the time of call) patient to take 5 more days of ABX, blow up 5-10 ballons daily to help with inflation and repeat CXR on Monday.    Order for xray placed and rx sent to [facility]

## 2012-09-30 NOTE — Telephone Encounter (Signed)
Both had CXR's yesterday and were waiting to hear about results. RN saw results in EPIC that showed pneumonia is improving but not resolved. Pt has had 2 rounds of abx. Does he need another one? Wife is very concerned. Please call and notify her.

## 2012-10-06 ENCOUNTER — Ambulatory Visit (HOSPITAL_BASED_OUTPATIENT_CLINIC_OR_DEPARTMENT_OTHER)
Admission: RE | Admit: 2012-10-06 | Discharge: 2012-10-06 | Disposition: A | Discharge status: Home / Self Care | Payer: Medicare Other | Source: Ambulatory Visit | Attending: Internal Medicine | Admitting: Internal Medicine

## 2012-10-06 DIAGNOSIS — R0602 Shortness of breath: Secondary | ICD-10-CM | POA: Insufficient documentation

## 2012-10-06 DIAGNOSIS — R5381 Other malaise: Secondary | ICD-10-CM | POA: Diagnosis not present

## 2012-10-06 DIAGNOSIS — J189 Pneumonia, unspecified organism: Secondary | ICD-10-CM | POA: Insufficient documentation

## 2012-10-13 DIAGNOSIS — M069 Rheumatoid arthritis, unspecified: Secondary | ICD-10-CM | POA: Diagnosis not present

## 2012-12-01 ENCOUNTER — Other Ambulatory Visit: Payer: Self-pay | Admitting: Internal Medicine

## 2012-12-02 ENCOUNTER — Telehealth: Payer: Self-pay | Admitting: Internal Medicine

## 2012-12-02 MED ORDER — ALPRAZOLAM 0.5 MG PO TABS: ORAL_TABLET | ORAL | Status: DC

## 2012-12-02 NOTE — Telephone Encounter (Signed)
Patient aware Controlled Substance Contract to be sign and rx to be picked up   

## 2012-12-02 NOTE — Telephone Encounter (Signed)
refill ALPRAZolam (Tab) 0.5 MG Take one (1) tablet(s) every eight (8) hours as needed only. DO NOT take routinely can be habit forming lat fill 12.20.13

## 2012-12-02 NOTE — Telephone Encounter (Signed)
TSH 244.9 

## 2012-12-05 DIAGNOSIS — Z79899 Other long term (current) drug therapy: Secondary | ICD-10-CM | POA: Diagnosis not present

## 2012-12-24 DIAGNOSIS — M069 Rheumatoid arthritis, unspecified: Secondary | ICD-10-CM | POA: Diagnosis not present

## 2013-01-05 ENCOUNTER — Other Ambulatory Visit: Payer: Self-pay | Admitting: Internal Medicine

## 2013-01-06 ENCOUNTER — Encounter: Payer: Self-pay | Admitting: Internal Medicine

## 2013-01-06 NOTE — Telephone Encounter (Signed)
250.02,995.20 Bmp, a1c, MALB

## 2013-02-25 ENCOUNTER — Telehealth: Payer: Self-pay | Admitting: Internal Medicine

## 2013-02-25 ENCOUNTER — Other Ambulatory Visit (INDEPENDENT_AMBULATORY_CARE_PROVIDER_SITE_OTHER): Payer: Medicare Other

## 2013-02-25 DIAGNOSIS — T887XXA Unspecified adverse effect of drug or medicament, initial encounter: Secondary | ICD-10-CM

## 2013-02-25 DIAGNOSIS — IMO0001 Reserved for inherently not codable concepts without codable children: Secondary | ICD-10-CM

## 2013-02-25 LAB — HEMOGLOBIN A1C: Hgb A1c MFr Bld: 7.9 % — ABNORMAL HIGH (ref 4.6–6.5)

## 2013-02-25 LAB — BASIC METABOLIC PANEL
Calcium: 9 mg/dL (ref 8.4–10.5)
Creatinine, Ser: 1.4 mg/dL (ref 0.4–1.5)
GFR: 53.34 mL/min — ABNORMAL LOW (ref >60.00)

## 2013-02-25 LAB — MICROALBUMIN / CREATININE URINE RATIO: Microalb, Ur: 2.3 mg/dL — ABNORMAL HIGH (ref 0.0–1.9)

## 2013-02-25 NOTE — Telephone Encounter (Signed)
GLIMEPRIDE 2 mg  Alprazolam .5mg   Also needs to have to check on xray followup to December  Also would like lab results sent to [facility] at [facility] [phone] fax and [phone] phone

## 2013-02-26 MED ORDER — ALPRAZOLAM 0.5 MG PO TABS: ORAL_TABLET | ORAL | Status: DC

## 2013-02-26 NOTE — Telephone Encounter (Signed)
RX for alprazolam called in, labs faxed. Left message on voicemail informing patient that he needs to schedule an appointment prior to refilling Glimepride, at that time order can be given for xray

## 2013-02-27 ENCOUNTER — Other Ambulatory Visit: Payer: Self-pay | Admitting: Internal Medicine

## 2013-02-27 ENCOUNTER — Encounter: Payer: Self-pay | Admitting: Internal Medicine

## 2013-02-27 ENCOUNTER — Ambulatory Visit (INDEPENDENT_AMBULATORY_CARE_PROVIDER_SITE_OTHER): Payer: Medicare Other | Admitting: Internal Medicine

## 2013-02-27 VITALS — BP 120/78 | HR 84 | Wt 245.0 lb

## 2013-02-27 DIAGNOSIS — E119 Type 2 diabetes mellitus without complications: Secondary | ICD-10-CM | POA: Diagnosis not present

## 2013-02-27 DIAGNOSIS — R9389 Abnormal findings on diagnostic imaging of other specified body structures: Secondary | ICD-10-CM

## 2013-02-27 MED ORDER — SAXAGLIPTIN-METFORMIN ER 5-500 MG PO TB24: 5.0000 mg | ORAL_TABLET | Freq: Every day | ORAL | Status: DC

## 2013-02-27 NOTE — Assessment & Plan Note (Signed)
Control is suboptimal; medication compliance is variable. Nutritional and exercise status also offer opportunity. For nephro protection he'll be changed to Kombiglyze XR 5/500. Recent medical studies suggest Sulfonylureas such as Glymiperide may have potentially negative cardiac impact.

## 2013-02-27 NOTE — Patient Instructions (Addendum)
Eat a low-fat diet with lots of fruits and vegetables, up to 7-9 servings per day. Consume less than 40   Grams (preferably ZERO) of sugar per day from foods & drinks with High Fructose Corn Syrup (HFCS) sugar as #1,2,3 or # 4 on label.Whole Foods, [employer] & [employer] do not carry products with HFCS. Follow a  low carb nutrition program such as [place] or [employer] Sugar [employer]  to prevent Diabetes progression . White carbohydrates (potatoes, rice, bread, and pasta) have a high spike of sugar and a high load of sugar. For example a  baked potato has a cup of sugar and a  french fry  2 teaspoons of sugar. Yams, wild  rice, whole grained bread &  wheat pasta have been much lower spike and load of  sugar. Portions should be the size of a deck of cards or your palm. Cardiovascular exercise, this can be as simple a program as walking, is recommended 30-45 minutes 3-4 times per week. If you're not exercising you should take 6-8 weeks to build up to this level. Please  schedule fasting Labs in 10-12 weeks : BMET,A1c, TSH. PLEASE BRING THESE INSTRUCTIONS TO FOLLOW UP  LAB APPOINTMENT.This will guarantee correct labs are drawn, eliminating need for repeat blood sampling ( needle sticks ! ). Diagnoses /Codes: 250.02.  Share results with all non [facility] medical staff seen   Apply Selsun Blue to the rash over the abdomen for 10 minutes each day  and shower it off . Continue this X 7 days

## 2013-02-27 NOTE — Progress Notes (Signed)
Subjective:    Patient ID: Louis [name], male    DOB: 07-Feb-1936, 77 y.o.   MRN: [id number]  HPI He is here todiscuss his diabetic status. A1c has risen to 7.9% which would correlate with an average blood sugar of 180 and increased risk of 58% for premature cardiovascular event. Despite his rising A1c; his urine microalbumin has dropped from 9.1 to 2.3. He does have a creatinine of 1.4 and GFR 53.34.  He's been on glimepiride 2 mg one half pill daily and metformin 500 mg twice a day with meals. Fasting or morning glucose  average is 105. Highest glucose 2 hours after any meal is < 170. No hypoglycemia reported                                                                                                                 No regular exercise described . No specific nutrition/diet followed Medication compliance is variable. He is missing his second dose of metformin approximately 50% of the time. No medication adverse effects noted. Eye exam current. Foot care not current   Review of Systems    No excess thirst ;  excess hunger ; or excess urination reported                              No lightheadedness with standing reported No chest pain ; palpitations ; claudication described .                                                                                                                             Non healing , pruritic shallow skin  Ulcer present 5-6 weeks. No sores of extremities noted. No numbness or tingling or burning in feet described  Weight  gain of 3 pounds. No blurred,double, or loss of vision reported  .      He describes fatigue with minimal exertion.  He's had recurrent gnagging right lateral abdominal discomfort for 40 years; he has an upcoming appointment with Dr. Marina Goodell, gastroenterologist      Objective:   Physical Exam  Gen.: well-nourished in appearance. Alert, appropriate and cooperative throughout exam.   Head: Normocephalic without obvious abnormalities Eyes: No corneal or conjunctival inflammation noted.  Mouth: Oral mucosa and oropharynx reveal no lesions or exudates. Teeth in good repair; upper & lower partials. Neck: No deformities, masses, or tenderness noted.  Thyroid normal. Lungs: Normal respiratory effort; chest expands symmetrically. Lungs are clear to auscultation without rales, wheezes, or increased work of breathing. Heart: Normal rate and rhythm. Normal S1 and S2. No gallop, click, or rub. Grade 1/6 systolic murmur. Abdomen: Bowel sounds normal; abdomen soft and nontender. No masses or organomegaly.Ventral hernia noted.                                  Musculoskeletal/extremities:  No clubbing, cyanosis, edema, or significant extremity  deformity noted. Tone & strength  Normal. Joints  reveal minor  DJD DIP changes. Nail health good. Able to lie down & sit up w/o help. Negative SLR bilaterally Vascular: Carotid, radial artery, dorsalis pedis and  posterior tibial pulses are full and equal. No bruits present. Neurologic: Alert and oriented x3.  Light touch normal over feet.        Skin: 10 x 12 mm dry scaly lesion right lower quadrant; no clinical evidence of cellulitis Lymph: No cervical, axillary, or inguinal lymphadenopathy present. Psych: Mood and affect are normal. Normally interactive                                                                                        Assessment & Plan:  See Current Assessment & Plan in Problem List under specific Diagnosis  Tinea corporis skin lesion suggested RLQ

## 2013-03-05 ENCOUNTER — Ambulatory Visit (HOSPITAL_BASED_OUTPATIENT_CLINIC_OR_DEPARTMENT_OTHER)
Admission: RE | Admit: 2013-03-05 | Discharge: 2013-03-05 | Disposition: A | Discharge status: Home / Self Care | Payer: Medicare Other | Source: Ambulatory Visit | Attending: Internal Medicine | Admitting: Internal Medicine

## 2013-03-05 ENCOUNTER — Telehealth: Payer: Self-pay

## 2013-03-05 DIAGNOSIS — Z87891 Personal history of nicotine dependence: Secondary | ICD-10-CM | POA: Insufficient documentation

## 2013-03-05 DIAGNOSIS — R918 Other nonspecific abnormal finding of lung field: Secondary | ICD-10-CM | POA: Diagnosis not present

## 2013-03-05 DIAGNOSIS — R9389 Abnormal findings on diagnostic imaging of other specified body structures: Secondary | ICD-10-CM

## 2013-03-05 NOTE — Telephone Encounter (Signed)
Message copied by Maurice Small on Thu Mar 05, 2013  4:50 PM ------      Message from: Pecola Lawless      Created: Thu Mar 05, 2013  4:43 PM       Minor residual scarring in the right upper lobe; otherwise pneumonia has resolved completely. Please blow up 10 balloons a day to help enhance complete inflation. Unfortunately mild scarring may be a prominent finding on future x-rays. Hopp ------

## 2013-03-05 NOTE — Telephone Encounter (Signed)
Spoke with patient, patient verbalized understanding of results, per Dr.Hopper as I was telling patient results, patient can disregard part about blowing up balloons. Patient to start back walking, slowly increase to 30 min daily over the next 6-8 weeks. Patient verbalized understanding

## 2013-03-11 ENCOUNTER — Other Ambulatory Visit: Payer: Self-pay | Admitting: General Practice

## 2013-03-11 DIAGNOSIS — M5137 Other intervertebral disc degeneration, lumbosacral region: Secondary | ICD-10-CM | POA: Diagnosis not present

## 2013-03-11 DIAGNOSIS — M545 Low back pain: Secondary | ICD-10-CM | POA: Diagnosis not present

## 2013-03-11 DIAGNOSIS — M48061 Spinal stenosis, lumbar region without neurogenic claudication: Secondary | ICD-10-CM | POA: Diagnosis not present

## 2013-03-11 MED ORDER — GLUCOSE BLOOD VI STRP: ORAL_STRIP | Status: DC

## 2013-03-16 ENCOUNTER — Encounter: Payer: Self-pay | Admitting: Internal Medicine

## 2013-03-16 ENCOUNTER — Other Ambulatory Visit (INDEPENDENT_AMBULATORY_CARE_PROVIDER_SITE_OTHER): Payer: Medicare Other

## 2013-03-16 ENCOUNTER — Ambulatory Visit (INDEPENDENT_AMBULATORY_CARE_PROVIDER_SITE_OTHER): Payer: Medicare Other | Admitting: Internal Medicine

## 2013-03-16 VITALS — BP 134/68 | HR 88 | Ht 72.0 in | Wt 241.0 lb

## 2013-03-16 DIAGNOSIS — R1084 Generalized abdominal pain: Secondary | ICD-10-CM

## 2013-03-16 DIAGNOSIS — K625 Hemorrhage of anus and rectum: Secondary | ICD-10-CM | POA: Diagnosis not present

## 2013-03-16 DIAGNOSIS — K219 Gastro-esophageal reflux disease without esophagitis: Secondary | ICD-10-CM

## 2013-03-16 DIAGNOSIS — K589 Irritable bowel syndrome without diarrhea: Secondary | ICD-10-CM

## 2013-03-16 LAB — BASIC METABOLIC PANEL
BUN: 13 mg/dL (ref 6–23)
Calcium: 8.8 mg/dL (ref 8.4–10.5)
GFR: 52.89 mL/min — ABNORMAL LOW (ref >60.00)
Glucose, Bld: 151 mg/dL — ABNORMAL HIGH (ref 70–99)

## 2013-03-16 LAB — CBC WITH DIFFERENTIAL/PLATELET
Basophils Absolute: 0 10*3/uL (ref 0.0–0.1)
Eosinophils Absolute: 0.1 10*3/uL (ref 0.0–0.7)
Lymphocytes Relative: 26.5 % (ref 12.0–46.0)
Lymphs Abs: 1.6 10*3/uL (ref 0.7–4.0)
MCHC: 32.9 g/dL (ref 30.0–36.0)
Monocytes Relative: 8 % (ref 3.0–12.0)
Platelets: 280 10*3/uL (ref 150.0–400.0)
RDW: 16.2 % — ABNORMAL HIGH (ref 11.5–14.6)

## 2013-03-16 LAB — FERRITIN: Ferritin: 10.5 ng/mL — ABNORMAL LOW (ref 22.0–322.0)

## 2013-03-16 MED ORDER — CILIDINIUM-CHLORDIAZEPOXIDE 2.5-5 MG PO CAPS: ORAL_CAPSULE | ORAL | Status: DC

## 2013-03-16 MED ORDER — CILIDINIUM-CHLORDIAZEPOXIDE 2.5-5 MG PO CAPS: 1.0000 | ORAL_CAPSULE | Freq: Three times a day (TID) | ORAL | Status: DC | PRN

## 2013-03-16 NOTE — Progress Notes (Signed)
HISTORY OF PRESENT ILLNESS:  Louis [name] is a 77 y.o. male with multiple significant medical problems who has been followed by GI for remote problems with small bowel obstruction secondary to lesions, colon cancer screening, GERD for which he takes omeprazole, ischemic colitis around January 2012, and remote history of hemochromatosis. Patient presents today for evaluation of abdominal pain. States he said lower abdominal discomfort exacerbated by meals off and on for many years. He feels like problems gotten worse in the past month. About 10 days ago he describes a discrete episode of severe lower abdominal discomfort associated with nausea, vomiting, diaphoresis. Subsequently loose stool with blood. Pain improved in a few days and leading resolved. Feeling better at this time. Continues with postprandial abdominal discomfort as previous. Review of outside laboratories from 02/25/2013 reveal unremarkable chemistries. Hemoglobin A1c 7.9. Last documented hemoglobin was in September 2012 it measured 9.7. His last complete colonoscopy was 10/31/2010. Diminutive colon polyps, severe diverticulosis with sigmoid stenosis and internal hemorrhoids noted. Followup in 5 years recommended  REVIEW OF SYSTEMS:  All non-GI ROS negative except for sinus and allergy trouble, back pain, arthritis, fatigue, hearing problems, skin rash  Past Medical History  Diagnosis Date  . Anemia   . Diabetes mellitus   . Hypertension   . Hyperlipidemia   . Diverticulosis   . Testosterone deficiency     111 in 2007  . Reflux esophagitis   . Spinal stenosis     Dr.Ramos  . Hemochromatosis      Dr Marina Goodell  . Arthritis     Dr Rodolph Bong, [facility] rx  . Hypothyroidism   . SBO (small bowel obstruction) 2004  . Kidney stones, calcium oxalate     1985 & 2005  . Hx of adenomatous colonic polyps   . Internal hemorrhoids     Past Surgical History  Procedure Laterality Date  . Appendectomy    . Shoulder surgery       2 on L ;  3 on R  . Colonoscopy w/ polypectomy  2005    tubular adenoma  . Lumbar nerve block       X 14; Dr Ethelene Hal  . Total shoulder replacement      L  . Total knee arthroplasty   201 & 2012     bilaterally;Dr Alusio  . Refractive surgery  2005    SE Ophth  . Tonsillectomy and adenoidectomy    . Cataract  2005    bilaterally  . Tm replacement  1981  . Middle ear surgery  1980    Teflon tack     Social History TRA WILEMON  reports that he quit smoking about 32 years ago. He has never used smokeless tobacco. He reports that  drinks alcohol. He reports that he does not use illicit drugs.  family history includes Cancer in his maternal grandmother; Depression in his brother; Diabetes in his father and maternal aunt; Heart disease in his paternal aunt and paternal uncle; Heart failure in his mother; and Stroke (age of onset: 71) in his father.  There is no history of Colon cancer, and COPD, and Asthma, .  Allergies  Allergen Reactions  . Shellfish Allergy     ANGIOEDEMA       PHYSICAL EXAMINATION: Vital signs: BP 134/68  Pulse 88  Ht 6' (1.829 m)  Wt 241 lb (109.317 kg)  BMI 32.68 kg/m2  Constitutional: generally well-appearing, no acute distress Psychiatric: alert and oriented x3, cooperative Eyes: extraocular movements intact, anicteric,  conjunctiva pink Mouth: oral pharynx moist, no lesions Neck: supple no lymphadenopathy Cardiovascular: heart regular rate and rhythm, no murmur Lungs: clear to auscultation bilaterally Abdomen: soft, obese, mild tenderness with palpation throughout,  nondistended, no obvious ascites, no peritoneal signs, normal bowel sounds, no organomegaly. Small scaly area on the abdominal wall consistent with mild cutaneous fungal rash Rectal: Deferred Extremities: no lower extremity edema bilaterally Skin: no lesions on visible extremities Neuro: No focal deficits. No asterixis.    ASSESSMENT:  #1. Recent problems with acute abdominal pain, nausea,  vomiting, and bleeding as described. Most likely a bout of ischemic colitis. Improve. #2. Chronic abdominal complaints with postprandial discomfort in the lower abdomen. Sounds like IBS #3. GERD. Symptoms controlled on PPI #4. History of adenomatous colon polyps. Last colonoscopy 2012 #5. Remote history of hemochromatosis #6. History of small bowel obstruction #7. History significant diverticulosis with stenosis #8. Multiple medical problems  PLAN:  #1. CT scan of the abdomen and pelvis #2. Ferritin, CBC, and BMET #3. Prescribe Librax one before meals as needed #4. Continue reflux precautions and PPI #5. Surveillance colonoscopy around 2017 #6. GI followup in 6-8 weeks

## 2013-03-16 NOTE — Patient Instructions (Addendum)
Your physician has requested that you go to the basement for the following lab work before leaving today:  CBC, BMET, Ferritin   You have been scheduled for a CT scan of the abdomen and pelvis at [facility] ([address]---this is in the same building as Architectural technologist).   You are scheduled on 03-17-13 at 9:30am. You should arrive 15 minutes prior to your appointment time for registration. Please follow the written instructions below on the day of your exam:  WARNING: IF YOU ARE ALLERGIC TO IODINE/X-RAY DYE, PLEASE NOTIFY RADIOLOGY IMMEDIATELY AT [phone]! YOU WILL BE GIVEN A 13 HOUR PREMEDICATION PREP.  1) Do not eat or drink anything after 5:30am (4 hours prior to your test) 2) You have been given 2 bottles of oral contrast to drink. The solution may taste better if refrigerated, but do NOT add ice or any other liquid to this solution. Shake well before drinking.    Drink 1 bottle of contrast @ 7:30am (2 hours prior to your exam)  Drink 1 bottle of contrast @ 8:30am (1 hour prior to your exam)  You may take any medications as prescribed with a small amount of water except for the following: Metformin, Glucophage, Glucovance, Avandamet, Riomet, Fortamet, Actoplus Met, Janumet, Glumetza or Metaglip. The above medications must be held the day of the exam AND 48 hours after the exam.  The purpose of you drinking the oral contrast is to aid in the visualization of your intestinal tract. The contrast solution may cause some diarrhea. Before your exam is started, you will be given a small amount of fluid to drink. Depending on your individual set of symptoms, you may also receive an intravenous injection of x-ray contrast/dye. Plan on being at [facility] for 30 minutes or long, depending on the type of exam you are having performed.  If you have any questions regarding your exam or if you need to reschedule, you may call the CT department at [phone] between the  hours of 8:00 am and 5:00 pm, Monday-Friday.  We have sent the following medications to your pharmacy for you to pick up at your convenience:  Librax       ________________________________________________________________________

## 2013-03-17 ENCOUNTER — Ambulatory Visit (INDEPENDENT_AMBULATORY_CARE_PROVIDER_SITE_OTHER)
Admission: RE | Admit: 2013-03-17 | Discharge: 2013-03-17 | Disposition: A | Discharge status: Home / Self Care | Payer: Medicare Other | Source: Ambulatory Visit | Attending: Internal Medicine | Admitting: Internal Medicine

## 2013-03-17 DIAGNOSIS — K7689 Other specified diseases of liver: Secondary | ICD-10-CM | POA: Diagnosis not present

## 2013-03-17 DIAGNOSIS — R1084 Generalized abdominal pain: Secondary | ICD-10-CM

## 2013-03-17 DIAGNOSIS — K589 Irritable bowel syndrome without diarrhea: Secondary | ICD-10-CM

## 2013-03-17 DIAGNOSIS — K625 Hemorrhage of anus and rectum: Secondary | ICD-10-CM

## 2013-03-17 DIAGNOSIS — K5730 Diverticulosis of large intestine without perforation or abscess without bleeding: Secondary | ICD-10-CM | POA: Diagnosis not present

## 2013-03-17 MED ORDER — IOHEXOL 300 MG/ML  SOLN
100.0000 mL | Freq: Once | INTRAMUSCULAR | Status: AC | PRN
  Administered 2013-03-17: 100 mL via INTRAVENOUS

## 2013-03-18 ENCOUNTER — Telehealth: Payer: Self-pay

## 2013-03-18 ENCOUNTER — Other Ambulatory Visit: Payer: Self-pay | Admitting: Internal Medicine

## 2013-03-18 DIAGNOSIS — M5137 Other intervertebral disc degeneration, lumbosacral region: Secondary | ICD-10-CM | POA: Diagnosis not present

## 2013-03-18 MED ORDER — DICYCLOMINE HCL 20 MG PO TABS: 20.0000 mg | ORAL_TABLET | Freq: Four times a day (QID) | ORAL | Status: DC

## 2013-03-18 NOTE — Telephone Encounter (Signed)
Per Dr. Marina Goodell, sent rx for Bentyl to replace the Librax that patient could not afford.  Called patient and spoke to Mrs. Niemczyk, let her know of the change.  She acknowledged and understood

## 2013-03-18 NOTE — Telephone Encounter (Signed)
Message copied by Jeanine Luz on Wed Mar 18, 2013  9:14 AM ------      Message from: Hilarie Fredrickson      Created: Tue Mar 17, 2013  2:18 PM       Bentyl 20 mg ac prn      ----- Message -----         From: Jeanine Luz, CMA         Sent: 03/17/2013   2:12 PM           To: Hilarie Fredrickson, MD            The co-pay for Librax for this patient is over $1,000.  Pharmacist wants to know if there are any alternatives you would be willing to use.       ------

## 2013-03-18 NOTE — Telephone Encounter (Signed)
TSH 244.9 

## 2013-04-01 ENCOUNTER — Telehealth: Payer: Self-pay | Admitting: Internal Medicine

## 2013-04-01 MED ORDER — GLUCOSE BLOOD VI STRP: ORAL_STRIP | Status: DC

## 2013-04-01 NOTE — Telephone Encounter (Signed)
Refill: Freestyle lite test strips u utd bid #100

## 2013-04-24 ENCOUNTER — Other Ambulatory Visit: Payer: Self-pay | Admitting: Internal Medicine

## 2013-04-27 ENCOUNTER — Telehealth: Payer: Self-pay | Admitting: *Deleted

## 2013-04-27 ENCOUNTER — Other Ambulatory Visit: Payer: Self-pay | Admitting: *Deleted

## 2013-04-27 MED ORDER — LEVOTHYROXINE SODIUM 125 MCG PO TABS: ORAL_TABLET | ORAL | Status: DC

## 2013-04-27 NOTE — Telephone Encounter (Signed)
I called patient to tell him that his labs are over due.  I left a voice mail message for patient to call the office at his earliest convenience.

## 2013-04-27 NOTE — Telephone Encounter (Signed)
Rx was filled for 30 day supply 30 ct.

## 2013-05-05 ENCOUNTER — Telehealth: Payer: Self-pay | Admitting: Internal Medicine

## 2013-05-05 DIAGNOSIS — E039 Hypothyroidism, unspecified: Secondary | ICD-10-CM

## 2013-05-05 DIAGNOSIS — E119 Type 2 diabetes mellitus without complications: Secondary | ICD-10-CM

## 2013-05-05 NOTE — Telephone Encounter (Signed)
Orders placed.

## 2013-05-05 NOTE — Telephone Encounter (Signed)
Patient is scheduled for lab visit on 05/11/13. Can you place orders please?

## 2013-05-11 ENCOUNTER — Telehealth: Payer: Self-pay | Admitting: Internal Medicine

## 2013-05-11 ENCOUNTER — Other Ambulatory Visit (INDEPENDENT_AMBULATORY_CARE_PROVIDER_SITE_OTHER): Payer: Medicare Other

## 2013-05-11 DIAGNOSIS — E119 Type 2 diabetes mellitus without complications: Secondary | ICD-10-CM

## 2013-05-11 DIAGNOSIS — E039 Hypothyroidism, unspecified: Secondary | ICD-10-CM

## 2013-05-11 LAB — TSH: TSH: 0.45 u[IU]/mL (ref 0.35–5.50)

## 2013-05-11 MED ORDER — ALPRAZOLAM 0.5 MG PO TABS: ORAL_TABLET | ORAL | Status: DC

## 2013-05-11 NOTE — Telephone Encounter (Signed)
Pt wanted to get a refill on his  ALPRAZolam Prudy Feeler) 0.5 MG tablet Pharmacy: [facility] [place] [address]

## 2013-05-11 NOTE — Telephone Encounter (Signed)
Spoke with patient, patient aware he will have to stop by the office and leave a UDS and pick-up rx. Patient verbalized understanding  Last UDS 12/03/2012

## 2013-05-14 DIAGNOSIS — Z79899 Other long term (current) drug therapy: Secondary | ICD-10-CM | POA: Diagnosis not present

## 2013-05-26 ENCOUNTER — Encounter: Payer: Self-pay | Admitting: Internal Medicine

## 2013-06-03 DIAGNOSIS — L570 Actinic keratosis: Secondary | ICD-10-CM | POA: Diagnosis not present

## 2013-06-03 DIAGNOSIS — L259 Unspecified contact dermatitis, unspecified cause: Secondary | ICD-10-CM | POA: Diagnosis not present

## 2013-06-04 ENCOUNTER — Other Ambulatory Visit: Payer: Self-pay | Admitting: Internal Medicine

## 2013-06-04 NOTE — Telephone Encounter (Signed)
Rx sent to the pharmacy by e-script.//AB/CMA 

## 2013-06-16 ENCOUNTER — Other Ambulatory Visit: Payer: Self-pay | Admitting: Internal Medicine

## 2013-06-29 DIAGNOSIS — M069 Rheumatoid arthritis, unspecified: Secondary | ICD-10-CM | POA: Diagnosis not present

## 2013-07-15 ENCOUNTER — Other Ambulatory Visit: Payer: Self-pay | Admitting: *Deleted

## 2013-07-15 ENCOUNTER — Other Ambulatory Visit: Payer: Self-pay | Admitting: Internal Medicine

## 2013-07-15 MED ORDER — SAXAGLIPTIN-METFORMIN ER 5-500 MG PO TB24: 1.0000 | ORAL_TABLET | Freq: Every day | ORAL | Status: DC

## 2013-07-15 NOTE — Telephone Encounter (Signed)
KOMBIGLYZE XR refill sent to pharmacy

## 2013-07-21 DIAGNOSIS — M5137 Other intervertebral disc degeneration, lumbosacral region: Secondary | ICD-10-CM | POA: Diagnosis not present

## 2013-08-10 ENCOUNTER — Other Ambulatory Visit: Payer: Self-pay | Admitting: Internal Medicine

## 2013-08-10 NOTE — Telephone Encounter (Signed)
OK X1 

## 2013-08-10 NOTE — Telephone Encounter (Signed)
Alprazolam 0.5 mg Last OV: 02/27/2013 Last Refill: 05/11/2013 #90, 0 refills Last UDS 05/12/13

## 2013-08-14 ENCOUNTER — Other Ambulatory Visit: Payer: Self-pay | Admitting: *Deleted

## 2013-08-14 MED ORDER — FREESTYLE LANCETS MISC: Status: DC

## 2013-08-14 NOTE — Telephone Encounter (Signed)
Freestyle lancets refilled

## 2013-09-16 ENCOUNTER — Other Ambulatory Visit: Payer: Self-pay | Admitting: Internal Medicine

## 2013-09-17 NOTE — Telephone Encounter (Signed)
ALPRAZolam (XANAX) 0.5 MG tablet Last refill: 08/10/13 #90, 0 refills Last OV: 02/27/13 UDS up-to-date

## 2013-09-17 NOTE — Telephone Encounter (Signed)
Lisinopril refilled per protocol. OV due. JG//CMA

## 2013-09-17 NOTE — Telephone Encounter (Signed)
OK X1 

## 2013-09-18 ENCOUNTER — Telehealth: Payer: Self-pay | Admitting: *Deleted

## 2013-09-18 NOTE — Telephone Encounter (Signed)
Script faxed to pharmacy. JG//CMA 

## 2013-09-29 NOTE — Telephone Encounter (Signed)
error 

## 2013-10-01 DIAGNOSIS — I2699 Other pulmonary embolism without acute cor pulmonale: Secondary | ICD-10-CM

## 2013-10-01 HISTORY — DX: Other pulmonary embolism without acute cor pulmonale: I26.99

## 2013-10-09 ENCOUNTER — Ambulatory Visit (INDEPENDENT_AMBULATORY_CARE_PROVIDER_SITE_OTHER): Payer: Medicare Other | Admitting: Internal Medicine

## 2013-10-09 ENCOUNTER — Encounter: Payer: Self-pay | Admitting: Internal Medicine

## 2013-10-09 VITALS — BP 126/61 | HR 74 | Temp 98.4°F | Resp 12 | Wt 226.8 lb

## 2013-10-09 DIAGNOSIS — N200 Calculus of kidney: Secondary | ICD-10-CM | POA: Diagnosis not present

## 2013-10-09 DIAGNOSIS — R300 Dysuria: Secondary | ICD-10-CM | POA: Diagnosis not present

## 2013-10-09 DIAGNOSIS — R319 Hematuria, unspecified: Secondary | ICD-10-CM

## 2013-10-09 LAB — POCT URINALYSIS DIPSTICK
Bilirubin, UA: NEGATIVE
GLUCOSE UA: NEGATIVE
Ketones, UA: NEGATIVE
Leukocytes, UA: NEGATIVE
NITRITE UA: NEGATIVE
Protein, UA: NEGATIVE
Spec Grav, UA: 1.02
UROBILINOGEN UA: 0.2
pH, UA: 5

## 2013-10-09 NOTE — Progress Notes (Signed)
   Subjective:    Patient ID: Louis [name], male    DOB: 1936/05/27, 78 y.o.   MRN: [id number]  HPI  A few days ago he began to notice  dark urine which he describes Coke-colored associated with dysuria. Despite this being untreated it is improving. He's had minor associated stomach cramps. There was no associated clay-colored stools.  He has a history of passing kidney stone spontaneously on 2 occasions during the period 1978-1981.    Review of Systems  He denies pyuria or hematuria.  He has chronic back pain related to spinal stenosis & ruptured discs; he is not been having flank pain.  He also denies fever, chills, sweats, or unexplained weight loss.  There's been no constipation or diarrhea.     Objective:   Physical Exam General appearance is one of good health and nourishment w/o distress.  Eyes: No conjunctival inflammation or scleral icterus is present.  Oral exam: Dental hygiene is good; lips and gums are healthy appearing.There is no oropharyngeal erythema or exudate noted.   Heart:  Normal rate and regular rhythm. S1 and S2 normal without gallop,  click, rub or other extra sounds.Grade 1/6 systolic murmur    Lungs:Chest clear to auscultation; no wheezes, rhonchi,rales ,or rubs present.No increased work of breathing.   Abdomen: bowel sounds normal, soft and non-tender without masses, organomegaly or hernias noted.  No guarding or rebound   Skin:Warm & dry.  Intact without suspicious lesions or rashes ; no jaundice or tenting  Lymphatic: No lymphadenopathy is noted about the head, neck, axilla.             Assessment & Plan:  #1 dysuria #2 dark urine See orders

## 2013-10-09 NOTE — Progress Notes (Signed)
Pre visit review using our clinic review tool, if applicable. No additional management support is needed unless otherwise documented below in the visit note. 

## 2013-10-09 NOTE — Addendum Note (Signed)
Addended by: Modena Morrow D on: 10/09/2013 05:00 PM   Modules accepted: Orders

## 2013-10-09 NOTE — Patient Instructions (Signed)
Drink as much nondairy fluids as possible. Avoid spicy foods or alcohol as  these may aggravate the bladder. Do not take decongestants. Avoid narcotics if possible. 

## 2013-10-10 ENCOUNTER — Other Ambulatory Visit: Payer: Self-pay | Admitting: Internal Medicine

## 2013-10-10 DIAGNOSIS — E782 Mixed hyperlipidemia: Secondary | ICD-10-CM

## 2013-10-10 DIAGNOSIS — E119 Type 2 diabetes mellitus without complications: Secondary | ICD-10-CM

## 2013-10-10 LAB — BASIC METABOLIC PANEL
BUN: 15 mg/dL (ref 6–23)
CO2: 26 mEq/L (ref 19–32)
Calcium: 9 mg/dL (ref 8.4–10.5)
Chloride: 105 mEq/L (ref 96–112)
Creat: 1.26 mg/dL (ref 0.50–1.35)
Glucose, Bld: 112 mg/dL — ABNORMAL HIGH (ref 70–99)
Potassium: 4.3 mEq/L (ref 3.5–5.3)
SODIUM: 141 meq/L (ref 135–145)

## 2013-10-11 LAB — URINE CULTURE
COLONY COUNT: NO GROWTH
ORGANISM ID, BACTERIA: NO GROWTH

## 2013-10-12 ENCOUNTER — Encounter: Payer: Self-pay | Admitting: *Deleted

## 2013-10-12 ENCOUNTER — Telehealth: Payer: Self-pay | Admitting: *Deleted

## 2013-10-12 MED ORDER — ROSUVASTATIN CALCIUM 10 MG PO TABS: 10.0000 mg | ORAL_TABLET | Freq: Every day | ORAL | Status: DC

## 2013-10-12 MED ORDER — SAXAGLIPTIN-METFORMIN ER 5-500 MG PO TB24: ORAL_TABLET | ORAL | Status: DC

## 2013-10-12 NOTE — Telephone Encounter (Signed)
Message copied by Harl Bowie on Mon Oct 12, 2013  1:27 PM ------      Message from: Hendricks Limes      Created: Sat Oct 10, 2013  3:15 PM       Please  schedule fasting Labs : Lipids, hepatic panel,  TSH,A1c & urine microalbumin. After reviewing chart I realize these are overdue ------

## 2013-10-13 ENCOUNTER — Other Ambulatory Visit: Payer: Self-pay | Admitting: Internal Medicine

## 2013-10-13 ENCOUNTER — Other Ambulatory Visit (INDEPENDENT_AMBULATORY_CARE_PROVIDER_SITE_OTHER): Payer: Medicare Other

## 2013-10-13 DIAGNOSIS — E119 Type 2 diabetes mellitus without complications: Secondary | ICD-10-CM | POA: Diagnosis not present

## 2013-10-13 DIAGNOSIS — E782 Mixed hyperlipidemia: Secondary | ICD-10-CM | POA: Diagnosis not present

## 2013-10-13 DIAGNOSIS — E039 Hypothyroidism, unspecified: Secondary | ICD-10-CM

## 2013-10-13 LAB — MICROALBUMIN / CREATININE URINE RATIO
CREATININE, U: 157.8 mg/dL
MICROALB/CREAT RATIO: 0.4 mg/g (ref 0.0–30.0)
Microalb, Ur: 0.7 mg/dL (ref 0.0–1.9)

## 2013-10-13 LAB — HEPATIC FUNCTION PANEL
ALK PHOS: 67 U/L (ref 39–117)
ALT: 18 U/L (ref 0–53)
AST: 19 U/L (ref 0–37)
Albumin: 3.9 g/dL (ref 3.5–5.2)
BILIRUBIN TOTAL: 0.5 mg/dL (ref 0.3–1.2)
Bilirubin, Direct: 0 mg/dL (ref 0.0–0.3)
Total Protein: 6.9 g/dL (ref 6.0–8.3)

## 2013-10-13 LAB — LIPID PANEL
Cholesterol: 115 mg/dL (ref 0–200)
HDL: 37.8 mg/dL — ABNORMAL LOW (ref >39.00)
LDL CALC: 42 mg/dL (ref 0–99)
Total CHOL/HDL Ratio: 3
Triglycerides: 177 mg/dL — ABNORMAL HIGH (ref 0.0–149.0)
VLDL: 35.4 mg/dL (ref 0.0–40.0)

## 2013-10-13 LAB — HEMOGLOBIN A1C: Hgb A1c MFr Bld: 7.1 % — ABNORMAL HIGH (ref 4.6–6.5)

## 2013-10-13 LAB — TSH: TSH: 0.13 u[IU]/mL — AB (ref 0.35–5.50)

## 2013-10-13 NOTE — Telephone Encounter (Signed)
Spoke with the pt on Mon(10-12-13) and informed him that he is needing some bloodwork.  Pt agreed and was scheduled for lab appt on Tues (10-13-13 @ 9:30am).  Also informed the pt that he can pick-up his recent lab results when he comes in for he lab appt.  Pt agreed.   Pt also asked for his Crestor 10mg  and Kombiglyze XR 5-500mg  to be refilled, which was seet to the pharmacy([facility]) by e-script.//AB/CMA

## 2013-10-14 ENCOUNTER — Encounter: Payer: Self-pay | Admitting: *Deleted

## 2013-10-14 ENCOUNTER — Telehealth: Payer: Self-pay

## 2013-10-14 NOTE — Telephone Encounter (Signed)
Relevant patient education assigned to patient using Emmi. ° °

## 2013-10-23 ENCOUNTER — Telehealth: Payer: Self-pay | Admitting: Internal Medicine

## 2013-10-23 MED ORDER — ALPRAZOLAM 0.5 MG PO TABS: ORAL_TABLET | ORAL | Status: DC

## 2013-10-23 NOTE — Telephone Encounter (Signed)
Patient states that [facility] has sent [place] four refill requests on his ALPRAZolam (XANAX) 0.5 MG tablet with no response. They call him to call to [place]. Please advise.

## 2013-10-23 NOTE — Telephone Encounter (Signed)
Requesting Alprazolam 0.5mg  1 tab every 8 hours prn Last refill:09-17-13-#90 Last OV:10-09-13 UDS:05-12-13-Moderate Risk Please advise.//AB/CMA

## 2013-10-23 NOTE — Telephone Encounter (Signed)
Rx printed and faxed to the pharmacy.  

## 2013-10-23 NOTE — Telephone Encounter (Signed)
Revere but capitalize prn only

## 2013-10-23 NOTE — Addendum Note (Signed)
Addended by: Harl Bowie on: 10/23/2013 05:45 PM   Modules accepted: Orders

## 2013-12-01 DIAGNOSIS — M5137 Other intervertebral disc degeneration, lumbosacral region: Secondary | ICD-10-CM | POA: Diagnosis not present

## 2013-12-16 DIAGNOSIS — M545 Low back pain, unspecified: Secondary | ICD-10-CM | POA: Diagnosis not present

## 2013-12-26 DIAGNOSIS — M545 Low back pain, unspecified: Secondary | ICD-10-CM | POA: Diagnosis not present

## 2013-12-28 ENCOUNTER — Other Ambulatory Visit: Payer: Self-pay | Admitting: Internal Medicine

## 2013-12-28 DIAGNOSIS — I10 Essential (primary) hypertension: Secondary | ICD-10-CM

## 2013-12-28 DIAGNOSIS — M069 Rheumatoid arthritis, unspecified: Secondary | ICD-10-CM | POA: Diagnosis not present

## 2013-12-28 NOTE — Telephone Encounter (Signed)
Refill for lisinopril sent to [facility] in [place] 

## 2014-01-18 ENCOUNTER — Other Ambulatory Visit: Payer: Self-pay | Admitting: Internal Medicine

## 2014-01-18 NOTE — Telephone Encounter (Signed)
Last ov 10/09/13 Med last filled 10/23/13 #90

## 2014-01-18 NOTE — Telephone Encounter (Signed)
OK but again prn , not maintenance

## 2014-01-20 DIAGNOSIS — M47817 Spondylosis without myelopathy or radiculopathy, lumbosacral region: Secondary | ICD-10-CM | POA: Diagnosis not present

## 2014-01-20 HISTORY — PX: OTHER SURGICAL HISTORY: SHX169

## 2014-01-31 ENCOUNTER — Encounter: Payer: Self-pay | Admitting: Internal Medicine

## 2014-02-23 ENCOUNTER — Encounter (HOSPITAL_BASED_OUTPATIENT_CLINIC_OR_DEPARTMENT_OTHER): Payer: Self-pay | Admitting: Emergency Medicine

## 2014-02-23 ENCOUNTER — Emergency Department (HOSPITAL_BASED_OUTPATIENT_CLINIC_OR_DEPARTMENT_OTHER)
Admission: EM | Admit: 2014-02-23 | Discharge: 2014-02-23 | Disposition: A | Discharge status: Home / Self Care | Payer: Medicare Other | Attending: Emergency Medicine | Admitting: Emergency Medicine

## 2014-02-23 DIAGNOSIS — Z7982 Long term (current) use of aspirin: Secondary | ICD-10-CM | POA: Insufficient documentation

## 2014-02-23 DIAGNOSIS — Z87891 Personal history of nicotine dependence: Secondary | ICD-10-CM | POA: Diagnosis not present

## 2014-02-23 DIAGNOSIS — Z79899 Other long term (current) drug therapy: Secondary | ICD-10-CM | POA: Insufficient documentation

## 2014-02-23 DIAGNOSIS — E039 Hypothyroidism, unspecified: Secondary | ICD-10-CM | POA: Insufficient documentation

## 2014-02-23 DIAGNOSIS — Z8739 Personal history of other diseases of the musculoskeletal system and connective tissue: Secondary | ICD-10-CM | POA: Insufficient documentation

## 2014-02-23 DIAGNOSIS — I10 Essential (primary) hypertension: Secondary | ICD-10-CM | POA: Diagnosis not present

## 2014-02-23 DIAGNOSIS — Z87442 Personal history of urinary calculi: Secondary | ICD-10-CM | POA: Diagnosis not present

## 2014-02-23 DIAGNOSIS — Z8719 Personal history of other diseases of the digestive system: Secondary | ICD-10-CM | POA: Insufficient documentation

## 2014-02-23 DIAGNOSIS — E119 Type 2 diabetes mellitus without complications: Secondary | ICD-10-CM | POA: Insufficient documentation

## 2014-02-23 DIAGNOSIS — Z860100 Personal history of colon polyps, unspecified: Secondary | ICD-10-CM | POA: Insufficient documentation

## 2014-02-23 DIAGNOSIS — M129 Arthropathy, unspecified: Secondary | ICD-10-CM | POA: Diagnosis not present

## 2014-02-23 DIAGNOSIS — L255 Unspecified contact dermatitis due to plants, except food: Secondary | ICD-10-CM | POA: Diagnosis not present

## 2014-02-23 DIAGNOSIS — E785 Hyperlipidemia, unspecified: Secondary | ICD-10-CM | POA: Insufficient documentation

## 2014-02-23 DIAGNOSIS — L259 Unspecified contact dermatitis, unspecified cause: Secondary | ICD-10-CM

## 2014-02-23 DIAGNOSIS — Z792 Long term (current) use of antibiotics: Secondary | ICD-10-CM | POA: Diagnosis not present

## 2014-02-23 NOTE — Discharge Instructions (Signed)
Use hydrocortisone cream on the area and benadryl as needed for itching Contact Dermatitis Contact dermatitis is a reaction to certain substances that touch the skin. Contact dermatitis can be either irritant contact dermatitis or allergic contact dermatitis. Irritant contact dermatitis does not require previous exposure to the substance for a reaction to occur.Allergic contact dermatitis only occurs if you have been exposed to the substance before. Upon a repeat exposure, your body reacts to the substance.  CAUSES  Many substances can cause contact dermatitis. Irritant dermatitis is most commonly caused by repeated exposure to mildly irritating substances, such as:  Makeup.  Soaps.  Detergents.  Bleaches.  Acids.  Metal salts, such as nickel. Allergic contact dermatitis is most commonly caused by exposure to:  Poisonous plants.  Chemicals (deodorants, shampoos).  Jewelry.  Latex.  Neomycin in triple antibiotic cream.  Preservatives in products, including clothing. SYMPTOMS  The area of skin that is exposed may develop:  Dryness or flaking.  Redness.  Cracks.  Itching.  Pain or a burning sensation.  Blisters. With allergic contact dermatitis, there may also be swelling in areas such as the eyelids, mouth, or genitals.  DIAGNOSIS  Your caregiver can usually tell what the problem is by doing a physical exam. In cases where the cause is uncertain and an allergic contact dermatitis is suspected, a patch skin test may be performed to help determine the cause of your dermatitis. TREATMENT Treatment includes protecting the skin from further contact with the irritating substance by avoiding that substance if possible. Barrier creams, powders, and gloves may be helpful. Your caregiver may also recommend:  Steroid creams or ointments applied 2 times daily. For best results, soak the rash area in cool water for 20 minutes. Then apply the medicine. Cover the area with a plastic  wrap. You can store the steroid cream in the refrigerator for a "chilly" effect on your rash. That may decrease itching. Oral steroid medicines may be needed in more severe cases.  Antibiotics or antibacterial ointments if a skin infection is present.  Antihistamine lotion or an antihistamine taken by mouth to ease itching.  Lubricants to keep moisture in your skin.  Burow's solution to reduce redness and soreness or to dry a weeping rash. Mix one packet or tablet of solution in 2 cups cool water. Dip a clean washcloth in the mixture, wring it out a bit, and put it on the affected area. Leave the cloth in place for 30 minutes. Do this as often as possible throughout the day.  Taking several cornstarch or baking soda baths daily if the area is too large to cover with a washcloth. Harsh chemicals, such as alkalis or acids, can cause skin damage that is like a burn. You should flush your skin for 15 to 20 minutes with cold water after such an exposure. You should also seek immediate medical care after exposure. Bandages (dressings), antibiotics, and pain medicine may be needed for severely irritated skin.  HOME CARE INSTRUCTIONS  Avoid the substance that caused your reaction.  Keep the area of skin that is affected away from hot water, soap, sunlight, chemicals, acidic substances, or anything else that would irritate your skin.  Do not scratch the rash. Scratching may cause the rash to become infected.  You may take cool baths to help stop the itching.  Only take over-the-counter or prescription medicines as directed by your caregiver.  See your caregiver for follow-up care as directed to make sure your skin is healing properly. SEEK  MEDICAL CARE IF:   Your condition is not better after 3 days of treatment.  You seem to be getting worse.  You see signs of infection such as swelling, tenderness, redness, soreness, or warmth in the affected area.  You have any problems related to your  medicines. Document Released: 09/14/2000 Document Revised: 12/10/2011 Document Reviewed: 02/20/2011 [facility] Patient Information 2014 [place], [place].

## 2014-02-23 NOTE — ED Notes (Signed)
Blistery rash on his right forearm since last week. Thinks it is poison.

## 2014-02-23 NOTE — ED Provider Notes (Signed)
CSN: [id number]     Arrival date & time 02/23/14  2002 History  This chart was scribed for Louis Dessert, MD by Delphia Grates, ED Scribe. This patient was seen in room MHTR1/MHTR1 and the patient's care was started at 8:55 PM.    Chief Complaint  Patient presents with  . Rash     The history is provided by the patient. No language interpreter was used.    HPI Comments: Louis [name] is a 78 y.o. Male with history of DM and HTN who presents to the Emergency Department complaining of rash on his right forearm onset one week ago. Patient states he was pulling vines in his yard when he noticed a rash on his right arm. There is associated redness and blisters. He states he went to the drug store and was given an OTC topical cream, but this was ineffective.   Past Medical History  Diagnosis Date  . Anemia   . Diabetes mellitus   . Hypertension   . Hyperlipidemia   . Diverticulosis   . Testosterone deficiency     111 in 2007  . Reflux esophagitis   . Spinal stenosis     Dr.Ramos  . Hemochromatosis      Dr Henrene Pastor  . Arthritis     Dr Lurline Hare, MTX rx  . Hypothyroidism   . SBO (small bowel obstruction) 2004  . Kidney stones, calcium oxalate     1985 & 2005  . Hx of adenomatous colonic polyps   . Internal hemorrhoids    Past Surgical History  Procedure Laterality Date  . Appendectomy    . Shoulder surgery       2 on L ; 3 on R  . Colonoscopy w/ polypectomy  2005    tubular adenoma  . Lumbar nerve block       X 14; Dr Nelva Bush  . Total shoulder replacement      L  . Total knee arthroplasty   201 & 2012     bilaterally;Dr Alusio  . Refractive surgery  2005    [facility] Ophth  . Tonsillectomy and adenoidectomy    . Cataract  2005    bilaterally  . Tm replacement  1981  . Middle ear surgery  1980    Teflon tack   . L4-5 & l5- s1 bilat facet injections  01/20/14     Dr Nelva Bush   Family History  Problem Relation Age of Onset  . Diabetes Maternal Aunt   . Colon cancer Neg  Hx   . COPD Neg Hx   . Asthma Neg Hx   . Depression Brother     suicide  . Diabetes Father   . Stroke Father 57    MI in hospital for CVA  . Heart failure Mother     valvular disease  . Heart disease Paternal Aunt      2 aunts; 1 had MI @ ?> 94  . Heart disease Paternal Uncle        . Cancer Maternal Grandmother     breast   History  Substance Use Topics  . Smoking status: Former Smoker    Quit date: 10/01/1980  . Smokeless tobacco: Never Used  . Alcohol Use: Yes     Comment: rare     Review of Systems  A complete 10 system review of systems was obtained and all systems are negative except as noted in the HPI and PMH.    Allergies  Shellfish allergy  Home Medications   Prior to Admission medications   Medication Sig Start Date End Date Taking? Authorizing Provider  ALPRAZolam (XANAX) 0.5 MG tablet TAKE 1 TABLET BY MOUTH EVERY 8 HOURS AS NEEDED ONLY. DO NOT TAKE ROUTINELY CAN BE HABIT FORMING 01/18/14   Hendricks Limes, MD  amoxicillin (AMOXIL) 500 MG tablet Take 500 mg by mouth. 3 BY MOUTH AS NEEDED PRIOR TO DENTAL PROCEDURE    Historical Provider, MD  aspirin 81 MG tablet Take 81 mg by mouth daily.      Historical Provider, MD  CALCIUM-VITAMIN D PO Take by mouth.      Historical Provider, MD  diphenhydrAMINE (BENADRYL) 25 mg capsule Take 25 mg by mouth daily.      Historical Provider, MD  folic acid (FOLVITE) 1 MG tablet Take 1 mg by mouth daily.      Historical Provider, MD  glucose blood test strip Check blood sugar once daily, DX:250.00 04/01/13   Hendricks Limes, MD  HYDROcodone-acetaminophen [facility]) 10-325 MG per tablet Take 1 tablet by mouth every 6 (six) hours as needed.      Historical Provider, MD  Lancets (FREESTYLE) lancets Test blood sugar daily. Dx code: 250.00 08/14/13   Hendricks Limes, MD  levothyroxine (SYNTHROID, LEVOTHROID) 125 MCG tablet TAKE ONE TABLET BY MOUTH EVERY DAY. 06/04/13   Hendricks Limes, MD  lisinopril (PRINIVIL,ZESTRIL) 20 MG tablet  TAKE 1 TABLET BY MOUTH DAILY 12/28/13   Hendricks Limes, MD  methotrexate (RHEUMATREX) 2.5 MG tablet 6 tabs every week.     Historical Provider, MD  multivitamin [facility]) per tablet Take 1 tablet by mouth daily.      Historical Provider, MD  omeprazole (PRILOSEC OTC) 20 MG tablet Take 20 mg by mouth daily.    Historical Provider, MD  rosuvastatin (CRESTOR) 10 MG tablet Take 1 tablet (10 mg total) by mouth at bedtime. 10/12/13 11/01/14  Hendricks Limes, MD  Saxagliptin-Metformin (KOMBIGLYZE XR) 5-500 MG TB24 TAKE 1 TABLET BY MOUTH ONCE DAILY AFTER BREAKFAST. 10/12/13   Hendricks Limes, MD   Triage Vitals: BP 140/72  Pulse 69  Temp(Src) 98.5 F (36.9 C) (Oral)  Resp 20  Ht 6' (1.829 m)  Wt 216 lb (97.977 kg)  BMI 29.29 kg/m2  SpO2 100%  Physical Exam  Nursing note and vitals reviewed. Constitutional: He is oriented to person, place, and time. He appears well-developed and well-nourished. No distress.  HENT:  Head: Normocephalic and atraumatic.  Eyes: EOM are normal.  Neck: Neck supple. No tracheal deviation present.  Cardiovascular: Normal rate.   Pulmonary/Chest: Effort normal. No respiratory distress.  Musculoskeletal: Normal range of motion.  Neurological: He is alert and oriented to person, place, and time.  Skin: Skin is warm and dry. Rash noted. Rash is maculopapular and pustular. There is erythema.  Psychiatric: He has a normal mood and affect. His behavior is normal.    ED Course  Procedures (including critical care time)  DIAGNOSTIC STUDIES: Oxygen Saturation is 100% on room air, normal by my interpretation.    COORDINATION OF CARE: At 2102 Discussed treatment plan with patient which includes benadryl and hydrocortisone cream. Patient agrees.   Labs Review Labs Reviewed - No data to display  Imaging Review No results found.   EKG Interpretation None      MDM   Final diagnoses:  Contact dermatitis    Patient with symptoms consistent with contact or  return to [place] from [place]. Very small localized area  that should respond to topical therapies.  I personally performed the services described in this documentation, which was scribed in my presence.  The recorded information has been reviewed and considered.    Louis Dessert, MD 02/23/14 2110

## 2014-03-23 DIAGNOSIS — M5137 Other intervertebral disc degeneration, lumbosacral region: Secondary | ICD-10-CM | POA: Diagnosis not present

## 2014-03-23 DIAGNOSIS — G894 Chronic pain syndrome: Secondary | ICD-10-CM | POA: Diagnosis not present

## 2014-03-26 ENCOUNTER — Other Ambulatory Visit: Payer: Self-pay | Admitting: Internal Medicine

## 2014-03-26 NOTE — Telephone Encounter (Signed)
OK X1  OV before any other refills The medication is " as needed" and should not be taken on a regular basis.  Regular use can increase risk of addiction but more importantly affect level of alertness and balance with increased risk of falling.

## 2014-03-26 NOTE — Telephone Encounter (Signed)
Last seen 10/09/13 Med last filled 01/18/14 #90

## 2014-05-14 ENCOUNTER — Other Ambulatory Visit: Payer: Self-pay | Admitting: Internal Medicine

## 2014-05-18 ENCOUNTER — Ambulatory Visit (INDEPENDENT_AMBULATORY_CARE_PROVIDER_SITE_OTHER): Payer: Medicare Other | Admitting: Internal Medicine

## 2014-05-18 ENCOUNTER — Encounter: Payer: Self-pay | Admitting: Internal Medicine

## 2014-05-18 ENCOUNTER — Other Ambulatory Visit (INDEPENDENT_AMBULATORY_CARE_PROVIDER_SITE_OTHER): Payer: Medicare Other

## 2014-05-18 VITALS — BP 114/70 | HR 60 | Temp 98.4°F | Wt 222.0 lb

## 2014-05-18 DIAGNOSIS — E039 Hypothyroidism, unspecified: Secondary | ICD-10-CM | POA: Diagnosis not present

## 2014-05-18 DIAGNOSIS — D649 Anemia, unspecified: Secondary | ICD-10-CM

## 2014-05-18 DIAGNOSIS — I10 Essential (primary) hypertension: Secondary | ICD-10-CM

## 2014-05-18 DIAGNOSIS — Z79899 Other long term (current) drug therapy: Secondary | ICD-10-CM | POA: Diagnosis not present

## 2014-05-18 DIAGNOSIS — E1159 Type 2 diabetes mellitus with other circulatory complications: Secondary | ICD-10-CM

## 2014-05-18 DIAGNOSIS — E782 Mixed hyperlipidemia: Secondary | ICD-10-CM

## 2014-05-18 LAB — HEPATIC FUNCTION PANEL
ALT: 14 U/L (ref 0–53)
AST: 19 U/L (ref 0–37)
Albumin: 4.4 g/dL (ref 3.5–5.2)
Alkaline Phosphatase: 71 U/L (ref 39–117)
BILIRUBIN DIRECT: 0.1 mg/dL (ref 0.0–0.3)
TOTAL PROTEIN: 7.6 g/dL (ref 6.0–8.3)
Total Bilirubin: 0.4 mg/dL (ref 0.2–1.2)

## 2014-05-18 LAB — BASIC METABOLIC PANEL
BUN: 16 mg/dL (ref 6–23)
CALCIUM: 9.1 mg/dL (ref 8.4–10.5)
CHLORIDE: 105 meq/L (ref 96–112)
CO2: 27 meq/L (ref 19–32)
Creatinine, Ser: 1.3 mg/dL (ref 0.4–1.5)
GFR: 55.96 mL/min — ABNORMAL LOW (ref >60.00)
GLUCOSE: 99 mg/dL (ref 70–99)
Potassium: 4.5 mEq/L (ref 3.5–5.1)
SODIUM: 141 meq/L (ref 135–145)

## 2014-05-18 LAB — LIPID PANEL
CHOL/HDL RATIO: 3
Cholesterol: 140 mg/dL (ref 0–200)
HDL: 43.2 mg/dL (ref >39.00)
LDL Cholesterol: 61 mg/dL (ref 0–99)
NONHDL: 96.8
Triglycerides: 177 mg/dL — ABNORMAL HIGH (ref 0.0–149.0)
VLDL: 35.4 mg/dL (ref 0.0–40.0)

## 2014-05-18 LAB — HEMOGLOBIN A1C: Hgb A1c MFr Bld: 6.9 % — ABNORMAL HIGH (ref 4.6–6.5)

## 2014-05-18 LAB — MICROALBUMIN / CREATININE URINE RATIO
Creatinine,U: 208.2 mg/dL
MICROALB UR: 3.1 mg/dL — AB (ref 0.0–1.9)
Microalb Creat Ratio: 1.5 mg/g (ref 0.0–30.0)

## 2014-05-18 LAB — TSH: TSH: 1.36 u[IU]/mL (ref 0.35–4.50)

## 2014-05-18 LAB — CK: Total CK: 86 U/L (ref 7–232)

## 2014-05-18 MED ORDER — ALPRAZOLAM 0.5 MG PO TABS: ORAL_TABLET | ORAL | Status: DC

## 2014-05-18 NOTE — Patient Instructions (Signed)
Your next office appointment will be determined based upon review of your pending labs . Those instructions will be transmitted to you by mail. Alprazolam is among those which experts have documented to have a very  high risk of affecting  mental  alertness  & balance. This results in increased risk of falling with serious health or life threatening injury. Such medication should be taken as infrequently as possible and @  the lowest possible dose.It should not be taken with alcohol, sedatives  or other agents which have a similar  adverse risk potential. These risks are greater as we age as there is decreased ability of the liver and kidneys to metabolize and excrete the medication, resulting in   increased blood levels of the active ingredient.

## 2014-05-18 NOTE — Assessment & Plan Note (Signed)
TSH 

## 2014-05-18 NOTE — Assessment & Plan Note (Signed)
Lipids, LFTs, TSH  

## 2014-05-18 NOTE — Progress Notes (Signed)
   Subjective:    Patient ID: Louis [name], male    DOB: Jun 14, 1936, 78 y.o.   MRN: [id number]  HPI   He is here for followup of his diabetes, hypertension, and dyslipidemia.  He is on no specific diet but does avoid excess sweets.  He walks a mile per day at least 3 times per week.  With avoidance of sweets and exercise he has lost 30 pounds over the  last 12 months  His ophthalmologic exam is up-to-date. He has not seen a podiatrist.  Fasting blood sugars range 97-123. He does not monitor postprandial glucoses  Blood pressures range 135-140/60-70s.  He has been compliant with his medications; he feels he has no adverse effects. His diabetic medications and Crestor cost him [phone] for 3 months. He is now on the donut hole    Review of Systems  All below NEGATIVE: Chest pain, palpitations       Dyspnea Edema Claudication  Lightheadedness,Syncope Weight change Polyuria/phagia/dipsia    Blurred vision /diplopia/lossof vision Limb numbness/tingling/burning Non healing skin lesions Abd pain, bowel changes   Myalgias      Objective:   Physical Exam  Pertinent or positive findings include: He has upper and lower partials. He is markedly hard of hearing. He has a distant S4 with no other abnormal heart sounds. Abdomen is protuberant without organomegaly or masses. Light touch was normal over both feet.  General appearance :adequately nourished; in no distress. Eyes: No conjunctival inflammation or scleral icterus is present. Oral exam: Lips and gums are healthy appearing.There is no oropharyngeal erythema or exudate noted.  Heart:  Normal rate and regular rhythm. S1 and S2 normal without gallop, murmur, click, or rub . Lungs:Chest clear to auscultation; no wheezes, rhonchi,rales ,or rubs present.No increased work of breathing.  Abdomen: bowel sounds normal, soft and non-tender .No guarding or rebound. No flank tenderness to percussion. Skin:Warm & dry.  Intact without  suspicious lesions or rashes ; no jaundice or tenting Lymphatic: No lymphadenopathy is noted about the head, neck, axilla          Assessment & Plan:  See Current Assessment & Plan in Problem List under specific Diagnosis

## 2014-05-18 NOTE — Progress Notes (Signed)
Pre visit review using our clinic review tool, if applicable. No additional management support is needed unless otherwise documented below in the visit note. 

## 2014-05-18 NOTE — Assessment & Plan Note (Signed)
A1c , urine microalbumin, BMET 

## 2014-05-18 NOTE — Assessment & Plan Note (Signed)
Blood pressure goals reviewed. BMET 

## 2014-05-19 ENCOUNTER — Other Ambulatory Visit: Payer: Self-pay | Admitting: Internal Medicine

## 2014-05-19 DIAGNOSIS — E1159 Type 2 diabetes mellitus with other circulatory complications: Secondary | ICD-10-CM

## 2014-05-19 NOTE — Assessment & Plan Note (Signed)
Xanax related risks discussed (see AVS)

## 2014-06-14 ENCOUNTER — Telehealth: Payer: Self-pay | Admitting: Internal Medicine

## 2014-06-14 DIAGNOSIS — E1159 Type 2 diabetes mellitus with other circulatory complications: Secondary | ICD-10-CM

## 2014-06-14 MED ORDER — PIOGLITAZONE HCL 15 MG PO TABS: 15.0000 mg | ORAL_TABLET | Freq: Every day | ORAL | Status: DC

## 2014-06-14 MED ORDER — METFORMIN HCL 500 MG PO TABS: 500.0000 mg | ORAL_TABLET | Freq: Two times a day (BID) | ORAL | Status: DC

## 2014-06-14 MED ORDER — ATORVASTATIN CALCIUM 20 MG PO TABS: 20.0000 mg | ORAL_TABLET | Freq: Every day | ORAL | Status: DC

## 2014-06-14 NOTE — Telephone Encounter (Signed)
Patient states that [facility] has messed up his scripts.  He is requesting a 30 day script for atorvastatatin 20mg , pioghlitazone 15mg  and metformin 500mg  to get him through until he gets his supply in from [place].  Patient would like scripts sent to [facility] in [place].

## 2014-06-14 NOTE — Telephone Encounter (Signed)
Script have been electronically sent

## 2014-06-22 ENCOUNTER — Telehealth: Payer: Self-pay | Admitting: Internal Medicine

## 2014-06-22 DIAGNOSIS — E1159 Type 2 diabetes mellitus with other circulatory complications: Secondary | ICD-10-CM

## 2014-06-22 MED ORDER — ATORVASTATIN CALCIUM 20 MG PO TABS: 20.0000 mg | ORAL_TABLET | Freq: Every day | ORAL | Status: DC

## 2014-06-22 MED ORDER — PIOGLITAZONE HCL 15 MG PO TABS: 15.0000 mg | ORAL_TABLET | Freq: Every day | ORAL | Status: DC

## 2014-06-22 MED ORDER — METFORMIN HCL 500 MG PO TABS: 500.0000 mg | ORAL_TABLET | Freq: Two times a day (BID) | ORAL | Status: DC

## 2014-06-22 NOTE — Telephone Encounter (Signed)
Pt came by and stated he has been having issues getting the following scripts through [facility].  He currently has a 30 day supply of the following through [facility] and is requesting from here on out Atorvastatin 20mg , Pioglitazone 15mg  and Metformin 500mg  be sent to [facility] for 90 day refills.  Pt will run out of the 30 day refills in about 20 days.  Please advise.

## 2014-06-22 NOTE — Telephone Encounter (Signed)
[facility]

## 2014-06-22 NOTE — Addendum Note (Signed)
Addended by: Roma Schanz R on: 06/22/2014 10:18 AM   Modules accepted: Orders

## 2014-06-30 DIAGNOSIS — M1990 Unspecified osteoarthritis, unspecified site: Secondary | ICD-10-CM | POA: Diagnosis not present

## 2014-06-30 DIAGNOSIS — M899 Disorder of bone, unspecified: Secondary | ICD-10-CM | POA: Diagnosis not present

## 2014-06-30 DIAGNOSIS — M069 Rheumatoid arthritis, unspecified: Secondary | ICD-10-CM | POA: Diagnosis not present

## 2014-06-30 DIAGNOSIS — M949 Disorder of cartilage, unspecified: Secondary | ICD-10-CM | POA: Diagnosis not present

## 2014-07-11 ENCOUNTER — Other Ambulatory Visit: Payer: Self-pay | Admitting: Internal Medicine

## 2014-07-21 DIAGNOSIS — H9312 Tinnitus, left ear: Secondary | ICD-10-CM | POA: Diagnosis not present

## 2014-07-21 DIAGNOSIS — H908 Mixed conductive and sensorineural hearing loss, unspecified: Secondary | ICD-10-CM | POA: Diagnosis not present

## 2014-07-21 DIAGNOSIS — H6122 Impacted cerumen, left ear: Secondary | ICD-10-CM | POA: Diagnosis not present

## 2014-07-21 DIAGNOSIS — H9193 Unspecified hearing loss, bilateral: Secondary | ICD-10-CM | POA: Diagnosis not present

## 2014-07-23 DIAGNOSIS — G894 Chronic pain syndrome: Secondary | ICD-10-CM | POA: Diagnosis not present

## 2014-07-23 DIAGNOSIS — M5136 Other intervertebral disc degeneration, lumbar region: Secondary | ICD-10-CM | POA: Diagnosis not present

## 2014-07-23 DIAGNOSIS — Z79891 Long term (current) use of opiate analgesic: Secondary | ICD-10-CM | POA: Diagnosis not present

## 2014-07-27 DIAGNOSIS — H35373 Puckering of macula, bilateral: Secondary | ICD-10-CM | POA: Diagnosis not present

## 2014-08-03 DIAGNOSIS — Z23 Encounter for immunization: Secondary | ICD-10-CM | POA: Diagnosis not present

## 2014-08-09 DIAGNOSIS — H908 Mixed conductive and sensorineural hearing loss, unspecified: Secondary | ICD-10-CM | POA: Diagnosis not present

## 2014-08-11 DIAGNOSIS — M5136 Other intervertebral disc degeneration, lumbar region: Secondary | ICD-10-CM | POA: Diagnosis not present

## 2014-08-12 ENCOUNTER — Telehealth: Payer: Self-pay | Admitting: Internal Medicine

## 2014-08-12 NOTE — Telephone Encounter (Signed)
Pt states he had diarrhea last night and this morning there was BRB in the toilet, states it colored the toilet bowl. Pt anxious and would like to be seen. Pt scheduled to see Nicoletta Ba PA tomorrow at 2:30pm. Pt aware of appt.

## 2014-08-13 ENCOUNTER — Ambulatory Visit (INDEPENDENT_AMBULATORY_CARE_PROVIDER_SITE_OTHER): Payer: Medicare Other | Admitting: Physician Assistant

## 2014-08-13 ENCOUNTER — Other Ambulatory Visit (INDEPENDENT_AMBULATORY_CARE_PROVIDER_SITE_OTHER): Payer: Medicare Other

## 2014-08-13 ENCOUNTER — Ambulatory Visit: Payer: Medicare Other | Admitting: Physician Assistant

## 2014-08-13 ENCOUNTER — Encounter: Payer: Self-pay | Admitting: Physician Assistant

## 2014-08-13 VITALS — BP 126/62 | HR 64 | Ht 72.0 in | Wt 212.4 lb

## 2014-08-13 DIAGNOSIS — E119 Type 2 diabetes mellitus without complications: Secondary | ICD-10-CM

## 2014-08-13 DIAGNOSIS — E039 Hypothyroidism, unspecified: Secondary | ICD-10-CM

## 2014-08-13 DIAGNOSIS — R112 Nausea with vomiting, unspecified: Secondary | ICD-10-CM

## 2014-08-13 DIAGNOSIS — K550 Acute vascular disorders of intestine: Secondary | ICD-10-CM

## 2014-08-13 DIAGNOSIS — K625 Hemorrhage of anus and rectum: Secondary | ICD-10-CM

## 2014-08-13 DIAGNOSIS — K55039 Acute (reversible) ischemia of large intestine, extent unspecified: Secondary | ICD-10-CM

## 2014-08-13 LAB — CBC WITH DIFFERENTIAL/PLATELET
Basophils Absolute: 0 10*3/uL (ref 0.0–0.1)
Basophils Relative: 0.3 % (ref 0.0–3.0)
EOS PCT: 0.1 % (ref 0.0–5.0)
Eosinophils Absolute: 0 10*3/uL (ref 0.0–0.7)
HEMATOCRIT: 39.9 % (ref 39.0–52.0)
Hemoglobin: 12.8 g/dL — ABNORMAL LOW (ref 13.0–17.0)
LYMPHS ABS: 2.1 10*3/uL (ref 0.7–4.0)
LYMPHS PCT: 18.6 % (ref 12.0–46.0)
MCHC: 32.1 g/dL (ref 30.0–36.0)
MCV: 91 fl (ref 78.0–100.0)
MONOS PCT: 5.8 % (ref 3.0–12.0)
Monocytes Absolute: 0.6 10*3/uL (ref 0.1–1.0)
Neutro Abs: 8.5 10*3/uL — ABNORMAL HIGH (ref 1.4–7.7)
Neutrophils Relative %: 75.2 % (ref 43.0–77.0)
PLATELETS: 281 10*3/uL (ref 150.0–400.0)
RBC: 4.39 Mil/uL (ref 4.22–5.81)
RDW: 15.1 % (ref 11.5–15.5)
WBC: 11.3 10*3/uL — AB (ref 4.0–10.5)

## 2014-08-13 LAB — COMPREHENSIVE METABOLIC PANEL
ALBUMIN: 3.9 g/dL (ref 3.5–5.2)
ALT: 13 U/L (ref 0–53)
AST: 15 U/L (ref 0–37)
Alkaline Phosphatase: 63 U/L (ref 39–117)
BUN: 24 mg/dL — ABNORMAL HIGH (ref 6–23)
CALCIUM: 9.3 mg/dL (ref 8.4–10.5)
CHLORIDE: 103 meq/L (ref 96–112)
CO2: 21 meq/L (ref 19–32)
Creatinine, Ser: 1.2 mg/dL (ref 0.4–1.5)
GFR: 61.83 mL/min (ref >60.00)
GLUCOSE: 118 mg/dL — AB (ref 70–99)
Potassium: 4.4 mEq/L (ref 3.5–5.1)
SODIUM: 141 meq/L (ref 135–145)
TOTAL PROTEIN: 7.5 g/dL (ref 6.0–8.3)
Total Bilirubin: 0.5 mg/dL (ref 0.2–1.2)

## 2014-08-13 LAB — HEMOGLOBIN A1C: Hgb A1c MFr Bld: 6.6 % — ABNORMAL HIGH (ref 4.6–6.5)

## 2014-08-13 LAB — TSH: TSH: 0.92 u[IU]/mL (ref 0.35–4.50)

## 2014-08-13 MED ORDER — ONDANSETRON HCL 4 MG PO TABS: ORAL_TABLET | ORAL | Status: DC

## 2014-08-13 MED ORDER — DICYCLOMINE HCL 10 MG PO CAPS: 10.0000 mg | ORAL_CAPSULE | Freq: Three times a day (TID) | ORAL | Status: DC

## 2014-08-13 NOTE — Progress Notes (Signed)
Subjective:    Patient ID: Louis [name], male    DOB: 03/13/1936, 78 y.o.   MRN: [id number]  HPI   Louis [name] is a very nice 78 year old white male known to Dr. Henrene Pastor. He has history of severe diverticular disease, history of adenomatous colon polyps and history of segmental colitis. He last had colonoscopy in January 2012 which showed severe diverticulosis of the sigmoid colon with stenosis 2 diminutive polyps were removed these were tubular adenomas and also had internal hemorrhoids. Patient also has history of adult-onset diabetes mellitus, hemachromatosis, hypothyroidism and hypertension. Patient had 2 remote admissions in 2003 and 2004 for personal small bowel obstructions which post resolved without surgery. He had an episode in January 2012 with acute abdominal pain and rectal bleeding and was found to have a segmental colitis of the left colon, most consistent with ischemia. Pacing comes in today as an acute work in dating that he's been having right upper quadrant discomfort over the past 6 weeks which is been fairly constant. He has also intentionally lost about 70 pounds over the past couple of years but says over the past couple of months she's been losing weight without trying. Earlier this week he had an episode of severe abdominal crampy pain which awakens him from sleep was associated with diffuse diaphoresis nausea vomiting and diarrhea. He says he was up and down all night. Early the following morning he had an episode of diarrhea which was all blood. He did not have any further bleeding that day continued to feel crampy and nauseated and then this morning had 2 very small episodes of dark red blood in the commode. He continues to feel nauseated sore and uncomfortable in his abdomen is not had any vomiting but does not feel like eating. He continues to have some lower abdominal cramping as well.    Review of Systems  Constitutional: Positive for appetite change and unexpected weight  change.  HENT: Negative.   Eyes: Negative.   Respiratory: Negative.   Cardiovascular: Negative.   Gastrointestinal: Positive for nausea, abdominal pain, diarrhea and blood in stool.  Endocrine: Negative.   Genitourinary: Negative.   Musculoskeletal: Negative.   Skin: Negative.   Allergic/Immunologic: Negative.   Neurological: Negative.   Hematological: Negative.   Psychiatric/Behavioral: Negative.    Outpatient Prescriptions Prior to Visit  Medication Sig Dispense Refill  . ALPRAZolam (XANAX) 0.5 MG tablet TAKE 1 TABLET BY MOUTH EVERY 8-12 HOURS AS NEEDED ONLY.Do NOT TAKE ROUTINELY. 90 tablet 0  . amoxicillin (AMOXIL) 500 MG tablet Take 500 mg by mouth. 3 BY MOUTH AS NEEDED PRIOR TO DENTAL PROCEDURE    . aspirin 81 MG tablet Take 81 mg by mouth daily.      Marland Kitchen atorvastatin (LIPITOR) 20 MG tablet Take 1 tablet (20 mg total) by mouth daily. 90 tablet 1  . CALCIUM-VITAMIN D PO Take by mouth.      . diphenhydrAMINE (BENADRYL) 25 mg capsule Take 25 mg by mouth daily.      . folic acid (FOLVITE) 1 MG tablet Take 1 mg by mouth daily.      Marland Kitchen glucose blood test strip Check blood sugar once daily, DX:250.00 100 each 11  . HYDROcodone-acetaminophen (NORCO) 10-325 MG per tablet Take 1 tablet by mouth every 6 (six) hours as needed.      . Lancets (FREESTYLE) lancets Test blood sugar daily. Dx code: 250.00 100 each 12  . levothyroxine (SYNTHROID, LEVOTHROID) 125 MCG tablet TAKE 1 TABLET BY MOUTH ONCE  DAILY 90 tablet 1  . lisinopril (PRINIVIL,ZESTRIL) 20 MG tablet TAKE 1 TABLET BY MOUTH DAILY 90 tablet 3  . metFORMIN (GLUCOPHAGE) 500 MG tablet Take 1 tablet (500 mg total) by mouth 2 (two) times daily with a meal. 180 tablet 1  . methotrexate (RHEUMATREX) 2.5 MG tablet 6 tabs every week.     . multivitamin (THERAGRAN) per tablet Take 1 tablet by mouth daily.      Marland Kitchen omeprazole (PRILOSEC OTC) 20 MG tablet Take 20 mg by mouth daily.    . pioglitazone (ACTOS) 15 MG tablet Take 1 tablet (15 mg total) by  mouth daily. 90 tablet 1   No facility-administered medications prior to visit.   Allergies  Allergen Reactions  . Shellfish Allergy     ANGIOEDEMA   Patient Active Problem List   Diagnosis Date Noted  . Nephrolithiasis 10/09/2013  . GERD (gastroesophageal reflux disease) 05/23/2012  . Unspecified vitamin D deficiency 03/25/2011  . Type II or unspecified type diabetes mellitus with peripheral circulatory disorders, uncontrolled(250.72) 10/09/2010  . ANXIETY 10/09/2010  . DIVERTICULOSIS-COLON 10/09/2010  . HYPOTHYROIDISM 07/07/2009  . DEGENERATIVE JOINT DISEASE 07/07/2009  . HYPERTENSION, ESSENTIAL NOS 05/27/2008  . HYPERLIPIDEMIA 10/16/2007  . TESTOSTERONE DEFICIENCY 05/14/2007  . ANEMIA-NOS 02/18/2007  . REFLUX ESOPHAGITIS 02/18/2007  . SPINAL STENOSIS 02/18/2007  . HEMOCHROMATOSIS 02/18/2007   History  Substance Use Topics  . Smoking status: Former Smoker    Quit date: 10/01/1980  . Smokeless tobacco: Never Used  . Alcohol Use: Yes     Comment: rare       family history includes Cancer in his maternal grandmother; Depression in his brother; Diabetes in his father and maternal aunt; Heart disease in his paternal aunt and paternal uncle; Heart failure in his mother; Stroke (age of onset: 53) in his father. There is no history of Colon cancer, COPD, or Asthma.  Objective:   Physical Exam well-developed elderly white male in no acute distress, pleasant accompanied by his wife blood pressure 126/62 pulse 64 height 6 foot weight 212. HEENT; nontraumatic, normocephalic EOMI PERRLA sclera anicteric, Supple; no JVD, Cardiovascular; regular rate and rhythm with S1-S2 no murmur rub or gallop, Pulmonary; clear bilaterally, Abdomen; soft he is markedly tender in the left upper quadrant and left mid quadrant and also mildly tender in the right upper quadrant there is no rebound or guarding and no palpable mass or hepatosplenomegaly, Rectal ;exam not done, Extremities; no clubbing cyanosis  or edema skin warm and dry, Psych; mood and affect appropriate.        Assessment & Plan:  #51  78 year old white male with history of an episode of segmental ischemic colitis 2012 now presenting with an acute episode of severe crampy abdominal pain followed by diaphoresis, nausea vomiting diarrhea and rectal bleeding onset about 48 hours ago. This current episode is also very consistent with a segmental ischemic colitis. #2 6 week history of right upper quadrant discomfort #3 known severe diverticular disease sigmoid colon #4 hypertension #5 hyperlipidemia #6diabetes mellitus #7 hemachromatosis #8 adenomatous colon polyps due for follow-up colonoscopy 2017 #9 significant weight loss over the past 2 years mostly intentional  Plan; CBC with differential C met Schedule for CT scan of the abdomen and pelvis with contrast Start Bentyl 10 mg 3 times daily as needed for cramping and pain Start Zofran 4 mg every 6 hours when necessary nausea Encouraged patient to take it easy over the next few days until symptoms improving, push oral fluids and soft bland  diet As he has lost a lot of weight, and recurrent segmental ischemic colitis may have been precipitated by relative hypotension nocturnally will decrease lisinopril 20 mg by mouth daily at bedtime to 10 mg by mouth daily at bedtime. Patient wife advised that if any of his bleeding recurs and is significant or if pain recurs he should seek care in through the emergency room over the weekend

## 2014-08-13 NOTE — Patient Instructions (Signed)
Please go to the basement level to have your labs drawn.  We sent prescriptions to [place] and [address]. 1. Bentyl 10 mg 2. Zofran 4 mg.   You have been scheduled for a CT scan of the abdomen and pelvis at [place] ([address]---this is in the same building as Press [facility]).   You are scheduled on 08-17-2014 at 9:45 am. You should arrive at 9:30 am  prior to your appointment time for registration. Please follow the written instructions below on the day of your exam:  WARNING: IF YOU ARE ALLERGIC TO IODINE/X-RAY DYE, PLEASE NOTIFY RADIOLOGY IMMEDIATELY AT [phone]! YOU WILL BE GIVEN A 13 HOUR PREMEDICATION PREP.  1) Do not eat or drink anything after7:45 PM (2 -hours prior to your test)  You may take any medications as prescribed with a small amount of water except for the following: Metformin, Glucophage, Glucovance, Avandamet, Riomet, Fortamet, Actoplus Met, Janumet, Glumetza or Metaglip. The above medications must be held the day of the exam AND 48 hours after the exam.  The purpose of you drinking the oral contrast is to aid in the visualization of your intestinal tract. The contrast solution may cause some diarrhea. Before your exam is started, you will be given a small amount of fluid to drink. Depending on your individual set of symptoms, you may also receive an intravenous injection of x-ray contrast/dye. Plan on being at [facility] for 30 minutes or long, depending on the type of exam you are having performed.  If you have any questions regarding your exam or if you need to reschedule, you may call the CT department at [phone] between the hours of 8:00 am and 5:00 pm, Monday-Friday.  ________________________________________________________________________

## 2014-08-17 ENCOUNTER — Emergency Department (HOSPITAL_COMMUNITY): Payer: Medicare Other

## 2014-08-17 ENCOUNTER — Encounter (HOSPITAL_COMMUNITY): Payer: Self-pay | Admitting: *Deleted

## 2014-08-17 ENCOUNTER — Inpatient Hospital Stay (HOSPITAL_COMMUNITY)
Admission: EM | Admit: 2014-08-17 | Discharge: 2014-08-20 | DRG: 176 | Disposition: A | Discharge status: Home / Self Care | Payer: Medicare Other | Attending: Internal Medicine | Admitting: Internal Medicine

## 2014-08-17 ENCOUNTER — Ambulatory Visit (INDEPENDENT_AMBULATORY_CARE_PROVIDER_SITE_OTHER)
Admission: RE | Admit: 2014-08-17 | Discharge: 2014-08-17 | Disposition: A | Discharge status: Home / Self Care | Payer: Medicare Other | Source: Ambulatory Visit | Attending: Physician Assistant | Admitting: Physician Assistant

## 2014-08-17 DIAGNOSIS — E119 Type 2 diabetes mellitus without complications: Secondary | ICD-10-CM

## 2014-08-17 DIAGNOSIS — E039 Hypothyroidism, unspecified: Secondary | ICD-10-CM | POA: Diagnosis present

## 2014-08-17 DIAGNOSIS — K550 Acute vascular disorders of intestine: Secondary | ICD-10-CM

## 2014-08-17 DIAGNOSIS — K210 Gastro-esophageal reflux disease with esophagitis: Secondary | ICD-10-CM | POA: Diagnosis present

## 2014-08-17 DIAGNOSIS — Z7901 Long term (current) use of anticoagulants: Secondary | ICD-10-CM

## 2014-08-17 DIAGNOSIS — K625 Hemorrhage of anus and rectum: Secondary | ICD-10-CM | POA: Diagnosis not present

## 2014-08-17 DIAGNOSIS — E785 Hyperlipidemia, unspecified: Secondary | ICD-10-CM | POA: Diagnosis present

## 2014-08-17 DIAGNOSIS — D649 Anemia, unspecified: Secondary | ICD-10-CM | POA: Diagnosis present

## 2014-08-17 DIAGNOSIS — K921 Melena: Secondary | ICD-10-CM | POA: Diagnosis present

## 2014-08-17 DIAGNOSIS — R112 Nausea with vomiting, unspecified: Secondary | ICD-10-CM | POA: Diagnosis present

## 2014-08-17 DIAGNOSIS — K7689 Other specified diseases of liver: Secondary | ICD-10-CM | POA: Diagnosis not present

## 2014-08-17 DIAGNOSIS — K5900 Constipation, unspecified: Secondary | ICD-10-CM | POA: Insufficient documentation

## 2014-08-17 DIAGNOSIS — Z7982 Long term (current) use of aspirin: Secondary | ICD-10-CM

## 2014-08-17 DIAGNOSIS — I10 Essential (primary) hypertension: Secondary | ICD-10-CM | POA: Diagnosis present

## 2014-08-17 DIAGNOSIS — I2699 Other pulmonary embolism without acute cor pulmonale: Secondary | ICD-10-CM | POA: Diagnosis not present

## 2014-08-17 DIAGNOSIS — E291 Testicular hypofunction: Secondary | ICD-10-CM | POA: Diagnosis present

## 2014-08-17 DIAGNOSIS — R109 Unspecified abdominal pain: Secondary | ICD-10-CM | POA: Diagnosis present

## 2014-08-17 DIAGNOSIS — M1990 Unspecified osteoarthritis, unspecified site: Secondary | ICD-10-CM | POA: Diagnosis present

## 2014-08-17 DIAGNOSIS — Z86711 Personal history of pulmonary embolism: Secondary | ICD-10-CM | POA: Diagnosis present

## 2014-08-17 DIAGNOSIS — R1084 Generalized abdominal pain: Secondary | ICD-10-CM | POA: Diagnosis not present

## 2014-08-17 DIAGNOSIS — K5790 Diverticulosis of intestine, part unspecified, without perforation or abscess without bleeding: Secondary | ICD-10-CM | POA: Diagnosis not present

## 2014-08-17 DIAGNOSIS — K55039 Acute (reversible) ischemia of large intestine, extent unspecified: Secondary | ICD-10-CM

## 2014-08-17 DIAGNOSIS — J449 Chronic obstructive pulmonary disease, unspecified: Secondary | ICD-10-CM | POA: Diagnosis not present

## 2014-08-17 LAB — CBC WITH DIFFERENTIAL/PLATELET
BASOS PCT: 0 % (ref 0–1)
Basophils Absolute: 0 10*3/uL (ref 0.0–0.1)
EOS ABS: 0.1 10*3/uL (ref 0.0–0.7)
Eosinophils Relative: 1 % (ref 0–5)
HCT: 38.5 % — ABNORMAL LOW (ref 39.0–52.0)
Hemoglobin: 12.7 g/dL — ABNORMAL LOW (ref 13.0–17.0)
Lymphocytes Relative: 30 % (ref 12–46)
Lymphs Abs: 2.4 10*3/uL (ref 0.7–4.0)
MCH: 30 pg (ref 26.0–34.0)
MCHC: 33 g/dL (ref 30.0–36.0)
MCV: 90.8 fL (ref 78.0–100.0)
MONOS PCT: 11 % (ref 3–12)
Monocytes Absolute: 0.8 10*3/uL (ref 0.1–1.0)
NEUTROS PCT: 58 % (ref 43–77)
Neutro Abs: 4.5 10*3/uL (ref 1.7–7.7)
PLATELETS: 270 10*3/uL (ref 150–400)
RBC: 4.24 MIL/uL (ref 4.22–5.81)
RDW: 14.5 % (ref 11.5–15.5)
WBC: 7.8 10*3/uL (ref 4.0–10.5)

## 2014-08-17 LAB — GLUCOSE, CAPILLARY: GLUCOSE-CAPILLARY: 105 mg/dL — AB (ref 70–99)

## 2014-08-17 LAB — COMPREHENSIVE METABOLIC PANEL
ALBUMIN: 4 g/dL (ref 3.5–5.2)
ALK PHOS: 68 U/L (ref 39–117)
ALT: 11 U/L (ref 0–53)
AST: 13 U/L (ref 0–37)
Anion gap: 11 (ref 5–15)
BUN: 21 mg/dL (ref 6–23)
CO2: 28 mEq/L (ref 19–32)
Calcium: 9.4 mg/dL (ref 8.4–10.5)
Chloride: 98 mEq/L (ref 96–112)
Creatinine, Ser: 1.28 mg/dL (ref 0.50–1.35)
GFR calc Af Amer: 61 mL/min — ABNORMAL LOW (ref >90)
GFR calc non Af Amer: 53 mL/min — ABNORMAL LOW (ref >90)
Glucose, Bld: 123 mg/dL — ABNORMAL HIGH (ref 70–99)
POTASSIUM: 4.2 meq/L (ref 3.7–5.3)
SODIUM: 137 meq/L (ref 137–147)
Total Bilirubin: 0.3 mg/dL (ref 0.3–1.2)
Total Protein: 7.4 g/dL (ref 6.0–8.3)

## 2014-08-17 LAB — PROTIME-INR
INR: 1 (ref 0.00–1.49)
Prothrombin Time: 13.3 seconds (ref 11.6–15.2)

## 2014-08-17 LAB — POC OCCULT BLOOD, ED: Fecal Occult Bld: NEGATIVE

## 2014-08-17 LAB — TROPONIN I: Troponin I: <0.3 ng/mL (ref <0.30)

## 2014-08-17 LAB — LIPASE, BLOOD: Lipase: 39 U/L (ref 11–59)

## 2014-08-17 MED ORDER — ASPIRIN 81 MG PO CHEW
81.0000 mg | CHEWABLE_TABLET | Freq: Every day | ORAL | Status: DC
  Administered 2014-08-18 – 2014-08-20 (×3): 81 mg via ORAL
  Filled 2014-08-17 (×3): qty 1

## 2014-08-17 MED ORDER — ATORVASTATIN CALCIUM 20 MG PO TABS
20.0000 mg | ORAL_TABLET | Freq: Every day | ORAL | Status: DC
Start: 2014-08-18 — End: 2014-08-20
  Administered 2014-08-18 – 2014-08-20 (×3): 20 mg via ORAL
  Filled 2014-08-17 (×3): qty 1

## 2014-08-17 MED ORDER — PANTOPRAZOLE SODIUM 40 MG PO TBEC
40.0000 mg | DELAYED_RELEASE_TABLET | Freq: Every day | ORAL | Status: DC
  Administered 2014-08-18 – 2014-08-20 (×3): 40 mg via ORAL
  Filled 2014-08-17 (×3): qty 1

## 2014-08-17 MED ORDER — INSULIN ASPART 100 UNIT/ML ~~LOC~~ SOLN
0.0000 [IU] | Freq: Three times a day (TID) | SUBCUTANEOUS | Status: DC
  Administered 2014-08-18 – 2014-08-19 (×3): 1 [IU] via SUBCUTANEOUS

## 2014-08-17 MED ORDER — METHOTREXATE (ANTI-RHEUMATIC) 2.5 MG PO TABS: 15.0000 mg | ORAL_TABLET | ORAL | Status: DC

## 2014-08-17 MED ORDER — METHOCARBAMOL 500 MG PO TABS: 500.0000 mg | ORAL_TABLET | Freq: Three times a day (TID) | ORAL | Status: DC | PRN

## 2014-08-17 MED ORDER — SODIUM CHLORIDE 0.9 % IV SOLN
INTRAVENOUS | Status: AC
  Administered 2014-08-17 – 2014-08-18 (×2): via INTRAVENOUS

## 2014-08-17 MED ORDER — IOHEXOL 350 MG/ML SOLN
100.0000 mL | Freq: Once | INTRAVENOUS | Status: AC | PRN
  Administered 2014-08-17: 100 mL via INTRAVENOUS

## 2014-08-17 MED ORDER — ZOLPIDEM TARTRATE 5 MG PO TABS
5.0000 mg | ORAL_TABLET | Freq: Every evening | ORAL | Status: DC | PRN
  Administered 2014-08-19 (×2): 5 mg via ORAL
  Filled 2014-08-17 (×2): qty 1

## 2014-08-17 MED ORDER — MORPHINE SULFATE 2 MG/ML IJ SOLN: 1.0000 mg | INTRAMUSCULAR | Status: DC | PRN

## 2014-08-17 MED ORDER — ONDANSETRON HCL 4 MG PO TABS: 4.0000 mg | ORAL_TABLET | Freq: Three times a day (TID) | ORAL | Status: DC | PRN

## 2014-08-17 MED ORDER — HEPARIN BOLUS VIA INFUSION
2100.0000 [IU] | Freq: Once | INTRAVENOUS | Status: AC
  Administered 2014-08-17: 2100 [IU] via INTRAVENOUS
  Filled 2014-08-17: qty 2100

## 2014-08-17 MED ORDER — ACETAMINOPHEN 650 MG RE SUPP: 650.0000 mg | Freq: Four times a day (QID) | RECTAL | Status: DC | PRN

## 2014-08-17 MED ORDER — FOLIC ACID 1 MG PO TABS
1.0000 mg | ORAL_TABLET | Freq: Every day | ORAL | Status: DC
  Administered 2014-08-18 – 2014-08-20 (×2): 1 mg via ORAL
  Filled 2014-08-17 (×3): qty 1

## 2014-08-17 MED ORDER — DICYCLOMINE HCL 10 MG PO CAPS
10.0000 mg | ORAL_CAPSULE | Freq: Three times a day (TID) | ORAL | Status: DC
  Administered 2014-08-18 – 2014-08-20 (×8): 10 mg via ORAL
  Filled 2014-08-17 (×9): qty 1

## 2014-08-17 MED ORDER — PIOGLITAZONE HCL 15 MG PO TABS
15.0000 mg | ORAL_TABLET | Freq: Every day | ORAL | Status: DC
  Administered 2014-08-18: 15 mg via ORAL
  Filled 2014-08-17 (×2): qty 1

## 2014-08-17 MED ORDER — ONDANSETRON HCL 4 MG PO TABS: 4.0000 mg | ORAL_TABLET | Freq: Four times a day (QID) | ORAL | Status: DC | PRN

## 2014-08-17 MED ORDER — ONDANSETRON HCL 4 MG/2ML IJ SOLN
4.0000 mg | Freq: Four times a day (QID) | INTRAMUSCULAR | Status: DC | PRN
  Administered 2014-08-18 – 2014-08-19 (×3): 4 mg via INTRAVENOUS
  Filled 2014-08-17 (×3): qty 2

## 2014-08-17 MED ORDER — DIPHENHYDRAMINE HCL 25 MG PO CAPS: 25.0000 mg | ORAL_CAPSULE | Freq: Every evening | ORAL | Status: DC | PRN

## 2014-08-17 MED ORDER — ALPRAZOLAM 0.5 MG PO TABS
0.5000 mg | ORAL_TABLET | Freq: Three times a day (TID) | ORAL | Status: DC | PRN
  Administered 2014-08-17: 0.5 mg via ORAL
  Filled 2014-08-17: qty 1

## 2014-08-17 MED ORDER — FENTANYL CITRATE 0.05 MG/ML IJ SOLN
50.0000 ug | Freq: Once | INTRAMUSCULAR | Status: AC
  Administered 2014-08-17: 50 ug via INTRAVENOUS
  Filled 2014-08-17: qty 2

## 2014-08-17 MED ORDER — LEVOTHYROXINE SODIUM 125 MCG PO TABS
125.0000 ug | ORAL_TABLET | Freq: Every day | ORAL | Status: DC
  Administered 2014-08-18 – 2014-08-20 (×3): 125 ug via ORAL
  Filled 2014-08-17 (×3): qty 1

## 2014-08-17 MED ORDER — HYDROCODONE-ACETAMINOPHEN 10-325 MG PO TABS
1.0000 | ORAL_TABLET | ORAL | Status: DC | PRN
  Administered 2014-08-17 – 2014-08-18 (×2): 1 via ORAL
  Filled 2014-08-17 (×2): qty 1

## 2014-08-17 MED ORDER — OMEPRAZOLE MAGNESIUM 20 MG PO TBEC: 20.0000 mg | DELAYED_RELEASE_TABLET | Freq: Every day | ORAL | Status: DC

## 2014-08-17 MED ORDER — IOHEXOL 350 MG/ML SOLN
100.0000 mL | Freq: Once | INTRAVENOUS | Status: AC | PRN
  Administered 2014-08-17: 80 mL via INTRAVENOUS

## 2014-08-17 MED ORDER — PANTOPRAZOLE SODIUM 40 MG PO TBEC
40.0000 mg | DELAYED_RELEASE_TABLET | ORAL | Status: AC
  Administered 2014-08-17: 40 mg via ORAL
  Filled 2014-08-17: qty 1

## 2014-08-17 MED ORDER — SODIUM CHLORIDE 0.9 % IV BOLUS (SEPSIS)
500.0000 mL | Freq: Once | INTRAVENOUS | Status: AC
  Administered 2014-08-17: 500 mL via INTRAVENOUS

## 2014-08-17 MED ORDER — SODIUM CHLORIDE 0.9 % IJ SOLN
3.0000 mL | Freq: Two times a day (BID) | INTRAMUSCULAR | Status: DC
  Administered 2014-08-19: 3 mL via INTRAVENOUS

## 2014-08-17 MED ORDER — HEPARIN (PORCINE) IN NACL 100-0.45 UNIT/ML-% IJ SOLN
1200.0000 [IU]/h | INTRAMUSCULAR | Status: DC
  Administered 2014-08-17: 1200 [IU]/h via INTRAVENOUS
  Filled 2014-08-17: qty 250

## 2014-08-17 MED ORDER — LISINOPRIL 20 MG PO TABS
20.0000 mg | ORAL_TABLET | Freq: Every day | ORAL | Status: DC
  Administered 2014-08-18 – 2014-08-20 (×3): 20 mg via ORAL
  Filled 2014-08-17 (×3): qty 1

## 2014-08-17 MED ORDER — ACETAMINOPHEN 325 MG PO TABS: 650.0000 mg | ORAL_TABLET | Freq: Four times a day (QID) | ORAL | Status: DC | PRN

## 2014-08-17 MED ORDER — ADULT MULTIVITAMIN W/MINERALS CH
1.0000 | ORAL_TABLET | Freq: Every day | ORAL | Status: DC
  Administered 2014-08-18 – 2014-08-20 (×3): 1 via ORAL
  Filled 2014-08-17 (×3): qty 1

## 2014-08-17 MED ORDER — MORPHINE SULFATE 2 MG/ML IJ SOLN
INTRAMUSCULAR | Status: AC
  Administered 2014-08-17: 2 mg via INTRAVENOUS
  Filled 2014-08-17: qty 1

## 2014-08-17 NOTE — Progress Notes (Signed)
ANTICOAGULATION CONSULT NOTE - Initial Consult  Pharmacy Consult for IV heparin Indication: pulmonary embolus  Allergies  Allergen Reactions  . Shellfish Allergy     ANGIOEDEMA    Patient Measurements:   Heparin Dosing Weight: 96  Vital Signs: Temp: 98.3 F (36.8 C) (11/17 1810) Temp Source: Oral (11/17 1810) BP: 123/61 mmHg (11/17 1930) Pulse Rate: 50 (11/17 1930)  Labs:  Recent Labs  08/17/14 1852  HGB 12.7*  HCT 38.5*  PLT 270  LABPROT 13.3  INR 1.00  CREATININE 1.28  TROPONINI <0.30    Estimated Creatinine Clearance: 59.1 mL/min (by C-G formula based on Cr of 1.28).   Medical History: Past Medical History  Diagnosis Date  . Anemia   . Diabetes mellitus   . Hypertension   . Hyperlipidemia   . Diverticulosis   . Testosterone deficiency     111 in 2007  . Reflux esophagitis   . Spinal stenosis     Dr.Ramos  . Hemochromatosis      Dr Henrene Pastor  . Arthritis     Dr Lurline Hare, MTX rx  . Hypothyroidism   . SBO (small bowel obstruction) 2004  . Kidney stones, calcium oxalate     1985 & 2005  . Hx of adenomatous colonic polyps   . Internal hemorrhoids     Medications:   (Not in a hospital admission) Scheduled:  Infusions:   Assessment: 78yo M with incidental finding of PE seen on CT of abdomen 11/17. He was being worked up for ischemic colitis d/t emesis, diarrhea, and BRB per rectum on 11/12. Pharmacy is asked to start heparin infusion.   Baseline coags are wnl.  CBC is ok.  SCr slightly elevated at 1.28.     Goal of Therapy:  Heparin level 0.3-0.7 units/ml Monitor platelets by anticoagulation protocol: Yes   Plan:   Give a conservative heparin bolus of 2100 units then start infusion at 1200units/hr.  Check heparin level in 8hrs and daily thereafter.  Check CBC q24h while on heparin.  F/u daily.  Romeo Rabon, PharmD, pager [phone]. 08/17/2014,9:26 PM.

## 2014-08-17 NOTE — ED Provider Notes (Signed)
CSN: [id number]     Arrival date & time 08/17/14  1756 History   First MD Initiated Contact with Patient 08/17/14 1827     Chief Complaint  Patient presents with  . Pulmonary Embolus      (Consider location/radiation/quality/duration/timing/severity/associated sxs/prior Treatment) Patient is a 78 y.o. male presenting with hematochezia. The history is provided by the patient.  Rectal Bleeding Quality:  Bright red Amount:  Moderate Duration:  1 day Timing:  Intermittent Progression:  Unchanged Chronicity:  Recurrent Context: spontaneously   Similar prior episodes: yes   Relieved by:  Nothing Worsened by:  Nothing tried Ineffective treatments:  None tried Associated symptoms: vomiting   Associated symptoms: no abdominal pain and no fever     Past Medical History  Diagnosis Date  . Anemia   . Diabetes mellitus   . Hypertension   . Hyperlipidemia   . Diverticulosis   . Testosterone deficiency     111 in 2007  . Reflux esophagitis   . Spinal stenosis     Dr.Ramos  . Hemochromatosis      Dr Henrene Pastor  . Arthritis     Dr Lurline Hare, MTX rx  . Hypothyroidism   . SBO (small bowel obstruction) 2004  . Kidney stones, calcium oxalate     1985 & 2005  . Hx of adenomatous colonic polyps   . Internal hemorrhoids    Past Surgical History  Procedure Laterality Date  . Appendectomy    . Shoulder surgery       2 on L ; 3 on R  . Colonoscopy w/ polypectomy  2005    tubular adenoma  . Lumbar nerve block       X 14; Dr Nelva Bush  . Total shoulder replacement      L  . Total knee arthroplasty   201 & 2012     bilaterally;Dr Alusio  . Refractive surgery  2005    SE Ophth  . Tonsillectomy and adenoidectomy    . Cataract  2005    bilaterally  . Tm replacement  1981  . Middle ear surgery  1980    Teflon tack   . L4-5 & l5- s1 bilat facet injections  01/20/14     Dr Nelva Bush   Family History  Problem Relation Age of Onset  . Diabetes Maternal Aunt   . Colon cancer Neg Hx   . COPD  Neg Hx   . Asthma Neg Hx   . Depression Brother     suicide  . Diabetes Father   . Stroke Father 70    MI in hospital for CVA  . Heart failure Mother     valvular disease  . Heart disease Paternal Aunt      2 aunts; 1 had MI @ ?> 27  . Heart disease Paternal Uncle        . Cancer Maternal Grandmother     breast   History  Substance Use Topics  . Smoking status: Former Smoker    Quit date: 10/01/1980  . Smokeless tobacco: Never Used  . Alcohol Use: Yes     Comment: rare     Review of Systems  Constitutional: Negative for fever.  HENT: Negative for drooling and rhinorrhea.   Eyes: Negative for pain.  Respiratory: Negative for cough and shortness of breath.   Cardiovascular: Negative for chest pain and leg swelling.  Gastrointestinal: Positive for vomiting, diarrhea and hematochezia. Negative for nausea and abdominal pain.  Genitourinary: Negative for dysuria and  hematuria.       Bloody stools   Musculoskeletal: Negative for gait problem and neck pain.  Skin: Negative for color change.  Neurological: Negative for numbness and headaches.  Hematological: Negative for adenopathy.  Psychiatric/Behavioral: Negative for behavioral problems.  All other systems reviewed and are negative.     Allergies  Shellfish allergy  Home Medications   Prior to Admission medications   Medication Sig Start Date End Date Taking? Authorizing Provider  ALPRAZolam (XANAX) 0.5 MG tablet TAKE 1 TABLET BY MOUTH EVERY 8-12 HOURS AS NEEDED ONLY.Do NOT TAKE ROUTINELY. 05/18/14   Hendricks Limes, MD  ALPRAZolam Duanne Moron) 0.5 MG tablet Take 0.5 mg by mouth every 8 (eight) hours as needed for anxiety.    Historical Provider, MD  amoxicillin (AMOXIL) 500 MG tablet Take 500 mg by mouth. 3 BY MOUTH AS NEEDED PRIOR TO DENTAL PROCEDURE    Historical Provider, MD  aspirin 81 MG tablet Take 81 mg by mouth daily.      Historical Provider, MD  atorvastatin (LIPITOR) 20 MG tablet Take 1 tablet (20 mg total) by  mouth daily. 06/22/14   Hendricks Limes, MD  CALCIUM-VITAMIN D PO Take by mouth.      Historical Provider, MD  dicyclomine (BENTYL) 10 MG capsule Take 1 capsule (10 mg total) by mouth 3 (three) times daily before meals. 08/13/14   Amy S Esterwood, PA-C  diphenhydrAMINE (BENADRYL) 25 mg capsule Take 25 mg by mouth daily.      Historical Provider, MD  FLUZONE HIGH-DOSE 0.5 ML SUSY  08/03/14   Historical Provider, MD  folic acid (FOLVITE) 1 MG tablet Take 1 mg by mouth daily.      Historical Provider, MD  glucose blood test strip Check blood sugar once daily, DX:250.00 04/01/13   Hendricks Limes, MD  HYDROcodone-acetaminophen [facility]) 10-325 MG per tablet Take 1 tablet by mouth every 6 (six) hours as needed.      Historical Provider, MD  Lancets (FREESTYLE) lancets Test blood sugar daily. Dx code: 250.00 08/14/13   Hendricks Limes, MD  levothyroxine (SYNTHROID, LEVOTHROID) 125 MCG tablet TAKE 1 TABLET BY MOUTH ONCE DAILY 07/12/14   Hendricks Limes, MD  levothyroxine (SYNTHROID, LEVOTHROID) 125 MCG tablet Take 125 mcg by mouth daily before breakfast.    Historical Provider, MD  lisinopril (PRINIVIL,ZESTRIL) 20 MG tablet TAKE 1 TABLET BY MOUTH DAILY 12/28/13   Hendricks Limes, MD  lisinopril (PRINIVIL,ZESTRIL) 20 MG tablet Take 20 mg by mouth daily.    Historical Provider, MD  metFORMIN (GLUCOPHAGE) 500 MG tablet Take 1 tablet (500 mg total) by mouth 2 (two) times daily with a meal. 06/22/14   Hendricks Limes, MD  methocarbamol (ROBAXIN) 500 MG tablet  05/18/14   Historical Provider, MD  methotrexate (RHEUMATREX) 2.5 MG tablet 6 tabs every week.     Historical Provider, MD  multivitamin [facility]) per tablet Take 1 tablet by mouth daily.      Historical Provider, MD  omeprazole (PRILOSEC OTC) 20 MG tablet Take 20 mg by mouth daily.    Historical Provider, MD  ondansetron (ZOFRAN) 4 MG tablet Take 1 tab every 6 hours as needed for nausea. 08/13/14   Amy S Esterwood, PA-C  pioglitazone (ACTOS) 15 MG  tablet Take 1 tablet (15 mg total) by mouth daily. 06/22/14   Hendricks Limes, MD   BP 132/56 mmHg  Pulse 55  Temp(Src) 98.3 F (36.8 C) (Oral)  Resp 16  SpO2 98% Physical  Exam  Constitutional: He is oriented to person, place, and time. He appears well-developed and well-nourished.  HENT:  Head: Normocephalic and atraumatic.  Right Ear: External ear normal.  Left Ear: External ear normal.  Nose: Nose normal.  Mouth/Throat: Oropharynx is clear and moist. No oropharyngeal exudate.  Eyes: Conjunctivae and EOM are normal. Pupils are equal, round, and reactive to light.  Neck: Normal range of motion. Neck supple.  Cardiovascular: Normal rate, regular rhythm, normal heart sounds and intact distal pulses.  Exam reveals no gallop and no friction rub.   No murmur heard. Pulmonary/Chest: Effort normal and breath sounds normal. No respiratory distress. He has no wheezes.  Abdominal: Soft. Bowel sounds are normal. He exhibits no distension. There is no tenderness. There is no rebound and no guarding.  Genitourinary:  Mild external hemorrhoid which is not inflamed or thrombosed. No significant amount of stool was obtained during the rectal exam for visual inspection.  Musculoskeletal: Normal range of motion. He exhibits no edema or tenderness.  Neurological: He is alert and oriented to person, place, and time.  Skin: Skin is warm and dry.  Psychiatric: He has a normal mood and affect. His behavior is normal.  Nursing note and vitals reviewed.   ED Course  Procedures (including critical care time) Labs Review Labs Reviewed  CBC WITH DIFFERENTIAL - Abnormal; Notable for the following:    Hemoglobin 12.7 (*)    HCT 38.5 (*)    All other components within normal limits  COMPREHENSIVE METABOLIC PANEL - Abnormal; Notable for the following:    Glucose, Bld 123 (*)    GFR calc non Af Amer 53 (*)    GFR calc Af Amer 61 (*)    All other components within normal limits  GLUCOSE, CAPILLARY -  Abnormal; Notable for the following:    Glucose-Capillary 105 (*)    All other components within normal limits  TROPONIN I  PROTIME-INR  LIPASE, BLOOD  HEPARIN LEVEL (UNFRACTIONATED)  COMPREHENSIVE METABOLIC PANEL  CBC WITH DIFFERENTIAL  CBC  POC OCCULT BLOOD, ED    Imaging Review Ct Angio Abd/pel W/ And/or W/o  08/17/2014   CLINICAL DATA:  Acute nausea, vomiting, abdominal pain with rectal bleeding 1 week ago.  EXAM: CTA ABDOMEN AND PELVIS WITHOUT CONTRAST  TECHNIQUE: Multidetector CT imaging of the abdomen and pelvis was performed using the standard protocol during bolus administration of intravenous contrast. Multiplanar reconstructed images and MIPs were obtained and reviewed to evaluate the vascular anatomy.  CONTRAST:  174mL OMNIPAQUE IOHEXOL 350 MG/ML SOLN  COMPARISON:  03/17/2013  FINDINGS: Lower chest: Clear lung bases. Right lower lobe pulmonary artery branching filling defect noted, image 12 compatible witha small right lower lobe pulmonary embolus.  Normal heart size. No pericardial or pleural effusion. No hiatal hernia.  Abdomen pelvis: Atherosclerosis noted of the abdominal aorta diffusely without significant aneurysm, dissection, retroperitoneal hemorrhage, or occlusive process. Minimal atherosclerosis of the celiac origin with slight narrowing, less than 30%. SMA, main renal arteries, and IMA are all visualized and patent. No evidence of significant mesenteric or renal vascular occlusive process. No accessory renal vasculature.  Mild tortuosity of the pelvic iliac vessels without occlusion, dissection or aneurysm. Visualize common femoral, proximal profunda femoral, and proximal superficial femoral arteries demonstrated are all patent. No acute vascular process.  Nonvascular: Liver demonstrates a stable minimally complex septated 16 mm cyst in the right hepatic lobe inferiorly, image 35 series 5. No other significant hepatic abnormality. No biliary dilatation. Patent portal and  hepatic  veins.  Collapsed gallbladder, biliary system, pancreas, spleen, and adrenal glands are within normal limits for age and demonstrate no acute process.  Kidneys demonstrate no renal obstruction or hydronephrosis. Incidental 5 mm punctate nonobstructing calculus in the right kidney lower pole, image 46.  Negative for bowel obstruction, dilatation, ileus, or free air. No visualized pneumatosis. Similar pattern of colonic diverticulosis, most pronounced in the sigmoid region. No surrounding inflammatory change about the colon.  No abdominal or pelvic free fluid, fluid collection, hemorrhage, abscess or adenopathy. No inguinal abnormality, or hernia. Prostate calcifications noted.  Degenerative changes of the lower lumbar spine and lumbar process.  Review of the MIP images confirms the above findings.  IMPRESSION: Patent mesenteric vasculature. Negative for mesenteric vascular occlusive disease.  Patent renal vasculature as well.  Aortoiliac atherosclerosis without acute process or significant occlusive vascular disease in the abdomen or pelvis.  Small right lower lobe pulmonary embolus.  Stable small right inferior hepatic cyst  Colonic diverticulosis  These results will be called to the ordering clinician or representative by the Radiologist Assistant, and communication documented in the PACS or zVision Dashboard.   Electronically Signed   By: Daryll Brod M.D.   On: 08/17/2014 15:01     EKG Interpretation   Date/Time:  Tuesday August 17 2014 18:50:41 EST Ventricular Rate:  54 PR Interval:  177 QRS Duration: 102 QT Interval:  423 QTC Calculation: 401 R Axis:   6 Text Interpretation:  Sinus rhythm No significant change since last  tracing Confirmed by Aarya Robinson  MD, [place] ([id number]) on 08/17/2014 6:52:39 PM      MDM   Final diagnoses:  Pulmonary embolism    6:49 PM 78 y.o. male w hx of DM, HTN, an episode of segmental ischemic colitis 2012 who presents with an incidental PE seen on the  right lower lobe of his lung during a CTA of his abdomen today. The patient was being worked up for ischemic colitis by his GI doctor. He states that last Wednesday night he had several episodes of emesis and diarrhea. On Thursday morning he had 2 episodes of bright red bloody bowel movements. He states that he has not had a bowel movement since that time. He denies any chest pain or shortness of breath. He is afebrile and vital signs are unremarkable here. Will perform Hemoccult, get screening lab work, and get CTA to further evaluate the PE.  Will heparinize and admit to hospitalist.     Pamella Pert, MD 08/18/14 0001

## 2014-08-17 NOTE — ED Notes (Signed)
Pt was being seen by GI for ischemic cholytis, pt had scan today. Results showed PE. Pt was called and told to come to ED. Pt denies pain or SOB.

## 2014-08-17 NOTE — ED Notes (Signed)
Aline Brochure, MD at bedside. IV attempt x2. Will have another RN attempt IV start.

## 2014-08-17 NOTE — H&P (Signed)
[facility] History and Physical  DEADRICK VERDUGO GLO:[id number] DOB: [birth year] DOA: 08/17/2014  Referring physician: ER physician. PCP: Marga Melnick, MD  Chief Complaint: Referred to the ER for pulmonary embolism.  HPI: LAEL GARHART is a 78 y.o. male with history of diabetes mellitus type 2 chronic anemia, hypertension, hyperlipidemia, arthritis and hypothyroidism was experiencing abdominal pain with rectal bleeding on last Wednesday and had gone to his gastroenterologist who had ordered CT angiogram of abdomen to rule out any ischemic process during which patient was found to have a right lower lobe pulmonary embolism and was referred to the ER. In the patient had CT angiogram of the chest which confirmed the right lower lobe pulmonary embolism. Patient is hemodynamically stable denies any chest pain or shortness of breath. Patient had multiple bowel movements last Wednesday and eventually started having large amount of rectal bleeding. Patient stated he had at least 3 episodes of rectal bleeding and abdominal pain across the abdomen. Since last Wednesday patient has not had any further bowel movement but has been having persistent abdominal pain. Had occasional nausea vomiting. CT of the abdomen was unremarkable. Since patient has had bleeding per rectum and will need to be on anticoagulants patient be admitted for observation and will be started on heparin for his pulmonary embolism.   Review of Systems: As presented in the history of presenting illness, rest negative.  Past Medical History  Diagnosis Date  . Anemia   . Diabetes mellitus   . Hypertension   . Hyperlipidemia   . Diverticulosis   . Testosterone deficiency     111 in 2007  . Reflux esophagitis   . Spinal stenosis     Dr.Ramos  . Hemochromatosis      Dr Marina Goodell  . Arthritis     Dr Rodolph Bong, MTX rx  . Hypothyroidism   . SBO (small bowel obstruction) 2004  . Kidney stones, calcium oxalate     1985 & 2005   . Hx of adenomatous colonic polyps   . Internal hemorrhoids    Past Surgical History  Procedure Laterality Date  . Appendectomy    . Shoulder surgery       2 on L ; 3 on R  . Colonoscopy w/ polypectomy  2005    tubular adenoma  . Lumbar nerve block       X 14; Dr Ethelene Hal  . Total shoulder replacement      L  . Total knee arthroplasty   201 & 2012     bilaterally;Dr Alusio  . Refractive surgery  2005    SE Ophth  . Tonsillectomy and adenoidectomy    . Cataract  2005    bilaterally  . Tm replacement  1981  . Middle ear surgery  1980    Teflon tack   . L4-5 & l5- s1 bilat facet injections  01/20/14     Dr Ethelene Hal   Social History:  reports that he quit smoking about 33 years ago. He has never used smokeless tobacco. He reports that he drinks alcohol. He reports that he does not use illicit drugs. Where does patient live home. Can patient participate in ADLs? Yes.  Allergies  Allergen Reactions  . Shellfish Allergy     ANGIOEDEMA    Family History:  Family History  Problem Relation Age of Onset  . Diabetes Maternal Aunt   . Colon cancer Neg Hx   . COPD Neg Hx   . Asthma Neg Hx   .  Depression Brother     suicide  . Diabetes Father   . Stroke Father 65    MI in hospital for CVA  . Heart failure Mother     valvular disease  . Heart disease Paternal Aunt      2 aunts; 1 had MI @ ?> 65  . Heart disease Paternal Uncle        . Cancer Maternal Grandmother     breast      Prior to Admission medications   Medication Sig Start Date End Date Taking? Authorizing Provider  ALPRAZolam Prudy Feeler) 0.5 MG tablet Take 0.5 mg by mouth every 8 (eight) hours as needed for anxiety (anxiety).    Yes Historical Provider, MD  amoxicillin (AMOXIL) 500 MG tablet Take 500 mg by mouth daily as needed (dental procedure). Take 3 tablets by mouth prior to dental procedure.   Yes Historical Provider, MD  aspirin 81 MG tablet Take 81 mg by mouth daily.     Yes Historical Provider, MD   atorvastatin (LIPITOR) 20 MG tablet Take 1 tablet (20 mg total) by mouth daily. 06/22/14  Yes Pecola Lawless, MD  CALCIUM-VITAMIN D PO Take 1 tablet by mouth daily.    Yes Historical Provider, MD  cholecalciferol (VITAMIN D) 1000 UNITS tablet Take 1,000 Units by mouth daily.   Yes Historical Provider, MD  dicyclomine (BENTYL) 10 MG capsule Take 1 capsule (10 mg total) by mouth 3 (three) times daily before meals. 08/13/14  Yes Amy S Esterwood, PA-C  diphenhydrAMINE (BENADRYL) 25 mg capsule Take 25 mg by mouth at bedtime as needed for allergies (allergies).    Yes Historical Provider, MD  folic acid (FOLVITE) 1 MG tablet Take 1 mg by mouth daily.     Yes Historical Provider, MD  glucose blood test strip Check blood sugar once daily, DX:250.00 04/01/13  Yes Pecola Lawless, MD  HYDROcodone-acetaminophen [facility]) 10-325 MG per tablet Take 1 tablet by mouth every 4 (four) hours as needed for moderate pain or severe pain (pain).    Yes Historical Provider, MD  Lancets (FREESTYLE) lancets Test blood sugar daily. Dx code: 250.00 08/14/13  Yes Pecola Lawless, MD  levothyroxine (SYNTHROID, LEVOTHROID) 125 MCG tablet Take 125 mcg by mouth daily before breakfast.   Yes Historical Provider, MD  lisinopril (PRINIVIL,ZESTRIL) 20 MG tablet Take 20 mg by mouth daily.   Yes Historical Provider, MD  metFORMIN (GLUCOPHAGE) 500 MG tablet Take 1 tablet (500 mg total) by mouth 2 (two) times daily with a meal. 06/22/14  Yes Pecola Lawless, MD  methocarbamol (ROBAXIN) 500 MG tablet Take 500 mg by mouth every 8 (eight) hours as needed for muscle spasms (muscle spasm).  05/18/14  Yes Historical Provider, MD  methotrexate (RHEUMATREX) 2.5 MG tablet Take 15 mg by mouth once a week. Monday Night.   Yes Historical Provider, MD  multivitamin [facility]) per tablet Take 1 tablet by mouth daily.     Yes Historical Provider, MD  omeprazole (PRILOSEC OTC) 20 MG tablet Take 20 mg by mouth daily.   Yes Historical Provider, MD   ondansetron (ZOFRAN) 4 MG tablet Take 1 tab every 6 hours as needed for nausea. 08/13/14  Yes Amy S Esterwood, PA-C  pioglitazone (ACTOS) 15 MG tablet Take 1 tablet (15 mg total) by mouth daily. 06/22/14  Yes Pecola Lawless, MD  vitamin B-12 (CYANOCOBALAMIN) 1000 MCG tablet Take 1,000 mcg by mouth daily.   Yes Historical Provider, MD  ALPRAZolam Prudy Feeler) 0.5 MG  tablet TAKE 1 TABLET BY MOUTH EVERY 8-12 HOURS AS NEEDED ONLY.Do NOT TAKE ROUTINELY. 05/18/14   Pecola Lawless, MD  amoxicillin (AMOXIL) 500 MG tablet Take 500 mg by mouth. 3 BY MOUTH AS NEEDED PRIOR TO DENTAL PROCEDURE    Historical Provider, MD  FLUZONE HIGH-DOSE 0.5 ML SUSY  08/03/14   Historical Provider, MD  levothyroxine (SYNTHROID, LEVOTHROID) 125 MCG tablet TAKE 1 TABLET BY MOUTH ONCE DAILY 07/12/14   Pecola Lawless, MD  lisinopril (PRINIVIL,ZESTRIL) 20 MG tablet TAKE 1 TABLET BY MOUTH DAILY 12/28/13   Pecola Lawless, MD    Physical Exam: Filed Vitals:   08/17/14 1900 08/17/14 1930 08/17/14 2116 08/17/14 2125  BP: 123/65 123/61  129/75  Pulse: 54 50  56  Temp:      TempSrc:      Resp: 16 11  19   Height:   6' (1.829 m)   Weight:   93.895 kg (207 lb)   SpO2: 98% 98%  97%     General:  Well developed and nourished.  Eyes: anicteric no pallor.  ENT: no discharge from the ears eyes nose mouth.  Neck: no mass felt.  Cardiovascular: S1-S2 heard.  Respiratory: no rhonchi or crepitations.  Abdomen: tenderness in the left upper quadrant. No guarding or rigidity. Bowel sounds present.  Skin: no rash.  Musculoskeletal: no edema.  Psychiatric: appears normal.  Neurologic: alert awake oriented to time place and person. Moves all activities.  Labs on Admission:  Basic Metabolic Panel:  Recent Labs Lab 08/13/14 1605 08/17/14 1852  NA 141 137  K 4.4 4.2  CL 103 98  CO2 21 28  GLUCOSE 118* 123*  BUN 24* 21  CREATININE 1.2 1.28  CALCIUM 9.3 9.4   Liver Function Tests:  Recent Labs Lab 08/13/14 1605  08/17/14 1852  AST 15 13  ALT 13 11  ALKPHOS 63 68  BILITOT 0.5 0.3  PROT 7.5 7.4  ALBUMIN 3.9 4.0   No results for input(s): LIPASE, AMYLASE in the last 168 hours. No results for input(s): AMMONIA in the last 168 hours. CBC:  Recent Labs Lab 08/13/14 1605 08/17/14 1852  WBC 11.3* 7.8  NEUTROABS 8.5* 4.5  HGB 12.8* 12.7*  HCT 39.9 38.5*  MCV 91.0 90.8  PLT 281.0 270   Cardiac Enzymes:  Recent Labs Lab 08/17/14 1852  TROPONINI <0.30    BNP (last 3 results) No results for input(s): PROBNP in the last 8760 hours. CBG: No results for input(s): GLUCAP in the last 168 hours.  Radiological Exams on Admission: Ct Angio Chest Pe W/cm &/or Wo Cm  08/17/2014   CLINICAL DATA:  Pulmonary embolism detected on CT abdomen/ pelvis exam of 08/17/2014  EXAM: CT ANGIOGRAPHY CHEST WITH CONTRAST  TECHNIQUE: Multidetector CT imaging of the chest was performed using the standard protocol during bolus administration of intravenous contrast. Multiplanar CT image reconstructions and MIPs were obtained to evaluate the vascular anatomy.  CONTRAST:  80mL OMNIPAQUE IOHEXOL 350 MG/ML SOLN  COMPARISON:  CT abdomen and pelvis 08/17/2014, CT abdomen and pelvis 10/10/2010  FINDINGS: Scattered atherosclerotic calcifications aorta and coronary arteries.  Aorta normal caliber without aneurysm or dissection.  Again identified small embolus within RIGHT lower lobe pulmonary artery no additional pulmonary emboli identified.  LV/RV ratio is elevated at 1.12 though ratio is also elevated at 1.0 on a prior CT from 2012; in light of the small embolus identified on the current study and the previous RIGHT ventricular dilatation, suspect elevated ratio is  related to underlying RIGHT ventricular dysfunction rather than RIGHT heart strain.  Visualized upper abdomen unremarkable.  No thoracic adenopathy.  Beam hardening artifacts from LEFT shoulder prosthesis.  Emphysematous changes with mild central peribronchial thickening.   No acute infiltrate, pleural effusion, or pneumothorax.  Bones appear demineralized.  Review of the MIP images confirms the above findings.  IMPRESSION: Small RIGHT lower lobe pulmonary embolus as identified on earlier CT abdomen/ pelvis exam.  No additional pulmonary emboli identified.  COPD changes.  Osseous demineralization.   Electronically Signed   By: Ulyses Southward M.D.   On: 08/17/2014 20:38   Ct Angio Abd/pel W/ And/or W/o  08/17/2014   CLINICAL DATA:  Acute nausea, vomiting, abdominal pain with rectal bleeding 1 week ago.  EXAM: CTA ABDOMEN AND PELVIS WITHOUT CONTRAST  TECHNIQUE: Multidetector CT imaging of the abdomen and pelvis was performed using the standard protocol during bolus administration of intravenous contrast. Multiplanar reconstructed images and MIPs were obtained and reviewed to evaluate the vascular anatomy.  CONTRAST:  OMNIPAQUE IOHEXOL 350 MG/ML SOLN  COMPARISON:  03/17/2013  FINDINGS: Lower chest: Clear lung bases. Right lower lobe pulmonary artery branching filling defect noted, image 12 compatible witha small right lower lobe pulmonary embolus.  Normal heart size. No pericardial or pleural effusion. No hiatal hernia.  Abdomen pelvis: Atherosclerosis noted of the abdominal aorta diffusely without significant aneurysm, dissection, retroperitoneal hemorrhage, or occlusive process. Minimal atherosclerosis of the celiac origin with slight narrowing, less than 30%. SMA, main renal arteries, and IMA are all visualized and patent. No evidence of significant mesenteric or renal vascular occlusive process. No accessory renal vasculature.  Mild tortuosity of the pelvic iliac vessels without occlusion, dissection or aneurysm. Visualize common femoral, proximal profunda femoral, and proximal superficial femoral arteries demonstrated are all patent. No acute vascular process.  Nonvascular: Liver demonstrates a stable minimally complex septated 16 mm cyst in the right hepatic lobe inferiorly,  image 35 series 5. No other significant hepatic abnormality. No biliary dilatation. Patent portal and hepatic veins.  Collapsed gallbladder, biliary system, pancreas, spleen, and adrenal glands are within normal limits for age and demonstrate no acute process.  Kidneys demonstrate no renal obstruction or hydronephrosis. Incidental 5 mm punctate nonobstructing calculus in the right kidney lower pole, image 46.  Negative for bowel obstruction, dilatation, ileus, or free air. No visualized pneumatosis. Similar pattern of colonic diverticulosis, most pronounced in the sigmoid region. No surrounding inflammatory change about the colon.  No abdominal or pelvic free fluid, fluid collection, hemorrhage, abscess or adenopathy. No inguinal abnormality, or hernia. Prostate calcifications noted.  Degenerative changes of the lower lumbar spine and lumbar process.  Review of the MIP images confirms the above findings.  IMPRESSION: Patent mesenteric vasculature. Negative for mesenteric vascular occlusive disease.  Patent renal vasculature as well.  Aortoiliac atherosclerosis without acute process or significant occlusive vascular disease in the abdomen or pelvis.  Small right lower lobe pulmonary embolus.  Stable small right inferior hepatic cyst  Colonic diverticulosis  These results will be called to the ordering clinician or representative by the Radiologist Assistant, and communication documented in the PACS or zVision Dashboard.   Electronically Signed   By: Ruel Favors M.D.   On: 08/17/2014 15:01     Assessment/Plan Principal Problem:   Pulmonary embolism Active Problems:   Hypothyroidism   HYPERLIPIDEMIA   Abdominal pain   Diabetes mellitus type 2, controlled   1. Pulmonary embolism unprovoked hemodynamically stable - patient has been placed on heparin  for now. Closely observe any further bleeding and falling hemoglobin. If not then patient can be started on oral anticoagulants. Dopplers of lower extremity  has been ordered and patient was advised that eventually need to follow-up with hematologist to check for the cause for pulmonary embolism. 2. Abdominal pain with rectal bleeding - patient still has tenderness in the abdomen mostly in the left upper quadrant. CT abdomen was unremarkable and labs are largely unremarkable. Check lipase. Patient has been placed on liquid diet for now. Continue pain relief medications. 3. Diabetes mellitus type 2 - since patient received contrast we'll hold metformin but continue other medications for diabetes with sliding scale coverage. 4. Hypertension - continue home medications. 5. Hypothyroidism - on Synthroid. 6. Hyperlipidemia - continue present medications. 7. Chronic anemia - see #1. Patient states he has had a colonoscopy 3 years ago and is being followed by Dr. Marina Goodell.    Code Status: full code.  Family Communication: patient's wife.  Disposition Plan: admit for observation.    Kenaz Olafson N. [facility] Pager [phone].  If 7PM-7AM, please contact night-coverage [email or web address] Password [facility] 08/17/2014, 9:57 PM

## 2014-08-18 ENCOUNTER — Observation Stay (HOSPITAL_COMMUNITY): Payer: Medicare Other

## 2014-08-18 DIAGNOSIS — M1990 Unspecified osteoarthritis, unspecified site: Secondary | ICD-10-CM | POA: Diagnosis present

## 2014-08-18 DIAGNOSIS — E119 Type 2 diabetes mellitus without complications: Secondary | ICD-10-CM | POA: Diagnosis not present

## 2014-08-18 DIAGNOSIS — K5900 Constipation, unspecified: Secondary | ICD-10-CM | POA: Diagnosis not present

## 2014-08-18 DIAGNOSIS — E291 Testicular hypofunction: Secondary | ICD-10-CM | POA: Diagnosis present

## 2014-08-18 DIAGNOSIS — D649 Anemia, unspecified: Secondary | ICD-10-CM | POA: Diagnosis present

## 2014-08-18 DIAGNOSIS — E785 Hyperlipidemia, unspecified: Secondary | ICD-10-CM | POA: Diagnosis present

## 2014-08-18 DIAGNOSIS — R112 Nausea with vomiting, unspecified: Secondary | ICD-10-CM | POA: Diagnosis not present

## 2014-08-18 DIAGNOSIS — Z7901 Long term (current) use of anticoagulants: Secondary | ICD-10-CM | POA: Diagnosis not present

## 2014-08-18 DIAGNOSIS — I2699 Other pulmonary embolism without acute cor pulmonale: Secondary | ICD-10-CM | POA: Diagnosis not present

## 2014-08-18 DIAGNOSIS — I10 Essential (primary) hypertension: Secondary | ICD-10-CM | POA: Diagnosis present

## 2014-08-18 DIAGNOSIS — K210 Gastro-esophageal reflux disease with esophagitis: Secondary | ICD-10-CM | POA: Diagnosis present

## 2014-08-18 DIAGNOSIS — E039 Hypothyroidism, unspecified: Secondary | ICD-10-CM | POA: Diagnosis not present

## 2014-08-18 DIAGNOSIS — Z7982 Long term (current) use of aspirin: Secondary | ICD-10-CM | POA: Diagnosis not present

## 2014-08-18 DIAGNOSIS — R1084 Generalized abdominal pain: Secondary | ICD-10-CM | POA: Diagnosis not present

## 2014-08-18 DIAGNOSIS — K921 Melena: Secondary | ICD-10-CM | POA: Diagnosis present

## 2014-08-18 LAB — CBC WITH DIFFERENTIAL/PLATELET
BASOS ABS: 0 10*3/uL (ref 0.0–0.1)
Basophils Relative: 0 % (ref 0–1)
EOS PCT: 2 % (ref 0–5)
Eosinophils Absolute: 0.1 10*3/uL (ref 0.0–0.7)
HEMATOCRIT: 34.3 % — AB (ref 39.0–52.0)
Hemoglobin: 11.1 g/dL — ABNORMAL LOW (ref 13.0–17.0)
Lymphocytes Relative: 28 % (ref 12–46)
Lymphs Abs: 2.3 10*3/uL (ref 0.7–4.0)
MCH: 29.8 pg (ref 26.0–34.0)
MCHC: 32.4 g/dL (ref 30.0–36.0)
MCV: 92.2 fL (ref 78.0–100.0)
Monocytes Absolute: 0.8 10*3/uL (ref 0.1–1.0)
Monocytes Relative: 10 % (ref 3–12)
Neutro Abs: 4.9 10*3/uL (ref 1.7–7.7)
Neutrophils Relative %: 60 % (ref 43–77)
Platelets: 247 10*3/uL (ref 150–400)
RBC: 3.72 MIL/uL — ABNORMAL LOW (ref 4.22–5.81)
RDW: 14.8 % (ref 11.5–15.5)
WBC: 8.1 10*3/uL (ref 4.0–10.5)

## 2014-08-18 LAB — COMPREHENSIVE METABOLIC PANEL
ALBUMIN: 3.5 g/dL (ref 3.5–5.2)
ALT: 10 U/L (ref 0–53)
AST: 13 U/L (ref 0–37)
Alkaline Phosphatase: 58 U/L (ref 39–117)
Anion gap: 11 (ref 5–15)
BUN: 21 mg/dL (ref 6–23)
CHLORIDE: 104 meq/L (ref 96–112)
CO2: 26 mEq/L (ref 19–32)
Calcium: 8.5 mg/dL (ref 8.4–10.5)
Creatinine, Ser: 1.19 mg/dL (ref 0.50–1.35)
GFR calc Af Amer: 67 mL/min — ABNORMAL LOW (ref >90)
GFR calc non Af Amer: 58 mL/min — ABNORMAL LOW (ref >90)
Glucose, Bld: 110 mg/dL — ABNORMAL HIGH (ref 70–99)
Potassium: 4.2 mEq/L (ref 3.7–5.3)
Sodium: 141 mEq/L (ref 137–147)
Total Bilirubin: 0.3 mg/dL (ref 0.3–1.2)
Total Protein: 6.3 g/dL (ref 6.0–8.3)

## 2014-08-18 LAB — HEPARIN LEVEL (UNFRACTIONATED)
HEPARIN UNFRACTIONATED: 0.41 [IU]/mL (ref 0.30–0.70)
Heparin Unfractionated: 0.39 IU/mL (ref 0.30–0.70)

## 2014-08-18 LAB — GLUCOSE, CAPILLARY
GLUCOSE-CAPILLARY: 137 mg/dL — AB (ref 70–99)
GLUCOSE-CAPILLARY: 103 mg/dL — AB (ref 70–99)
Glucose-Capillary: 108 mg/dL — ABNORMAL HIGH (ref 70–99)
Glucose-Capillary: 122 mg/dL — ABNORMAL HIGH (ref 70–99)

## 2014-08-18 MED ORDER — HEPARIN (PORCINE) IN NACL 100-0.45 UNIT/ML-% IJ SOLN
1300.0000 [IU]/h | INTRAMUSCULAR | Status: DC
  Administered 2014-08-18: 1300 [IU]/h via INTRAVENOUS
  Filled 2014-08-18 (×3): qty 250

## 2014-08-18 MED ORDER — POLYETHYLENE GLYCOL 3350 17 G PO PACK
17.0000 g | PACK | Freq: Two times a day (BID) | ORAL | Status: DC
  Administered 2014-08-18 – 2014-08-20 (×3): 17 g via ORAL
  Filled 2014-08-18 (×4): qty 1

## 2014-08-18 MED ORDER — DOCUSATE SODIUM 100 MG PO CAPS
100.0000 mg | ORAL_CAPSULE | Freq: Two times a day (BID) | ORAL | Status: DC
  Administered 2014-08-18 – 2014-08-20 (×4): 100 mg via ORAL
  Filled 2014-08-18 (×5): qty 1

## 2014-08-18 NOTE — Progress Notes (Signed)
[facility] PROGRESS NOTE  Louis [name] AOZ:[id number] DOB: Jul 21, 1936 DOA: 08/17/2014 PCP: Marga Melnick, MD  Assessment/Plan: #1 acute pulmonary emboli Questionable etiology. Patient also ready been started on anticoagulation and as such hypercoagulable panel cannot be obtained at this time. Lower extremity Dopplers have been obtained which are negative for DVT.patient currently on IV heparin. Will monitor for now. As patient with recent history of lower GI bleed.  #2 abdominal pain with rectal bleeding CT of abdomen and pelvis was unremarkable. Patient currently tolerating the liquid diet. No further rectal bleeding. Get abdominal x-ray. Follow.  #3 type 2 diabetes Sliding scale insulin. D/C Actos.  #4 hypertension Stable. Continue home regimen of lisinopril.  #5 hyperlipidemia Continue Lipitor.  #6 hypothyroidism Continue Synthroid.  #7 prophylaxis PPI for GI prophylaxis. On heparin for DVT prophylaxis.  Code Status: full Family Communication: updated patient and wife at bedside. Disposition Plan: HOME WHEN MEDICALLY STABLE.   Consultants:  NONE  Procedures:  CT angiogram chest 08/17/2014  CT abdomen and pelvis 08/17/2014  Bilateral lower extremity Dopplers 08/18/2014  Antibiotics:  none  HPI/Subjective: Patient with no complaints. Patient denies any rectal bleeding. Patient states hasn't had a bowel movement in approximately 1 week. Patient states he is passing a lot of gas.  Objective: Filed Vitals:   08/18/14 0450  BP: 125/83  Pulse: 52  Temp: 98.3 F (36.8 C)  Resp: 18    Intake/Output Summary (Last 24 hours) at 08/18/14 1406 Last data filed at 08/18/14 0900  Gross per 24 hour  Intake   1805 ml  Output    200 ml  Net   1605 ml   Filed Weights   08/17/14 2116 08/17/14 2220  Weight: 93.895 kg (207 lb) 94.847 kg (209 lb 1.6 oz)    Exam:   General:  NAD  Cardiovascular: RRR  Respiratory: CTAB  Abdomen: soft, nontender,  nondistended, positive bowel sounds.  Musculoskeletal: no clubbing cyanosis or edema.  Data Reviewed: Basic Metabolic Panel:  Recent Labs Lab 08/13/14 1605 08/17/14 1852 08/18/14 0550  NA 141 137 141  K 4.4 4.2 4.2  CL 103 98 104  CO2 21 28 26   GLUCOSE 118* 123* 110*  BUN 24* 21 21  CREATININE 1.2 1.28 1.19  CALCIUM 9.3 9.4 8.5   Liver Function Tests:  Recent Labs Lab 08/13/14 1605 08/17/14 1852 08/18/14 0550  AST 15 13 13   ALT 13 11 10   ALKPHOS 63 68 58  BILITOT 0.5 0.3 0.3  PROT 7.5 7.4 6.3  ALBUMIN 3.9 4.0 3.5    Recent Labs Lab 08/17/14 1852  LIPASE 39   No results for input(s): AMMONIA in the last 168 hours. CBC:  Recent Labs Lab 08/13/14 1605 08/17/14 1852 08/18/14 0550  WBC 11.3* 7.8 8.1  NEUTROABS 8.5* 4.5 4.9  HGB 12.8* 12.7* 11.1*  HCT 39.9 38.5* 34.3*  MCV 91.0 90.8 92.2  PLT 281.0 270 247   Cardiac Enzymes:  Recent Labs Lab 08/17/14 1852  TROPONINI <0.30   BNP (last 3 results) No results for input(s): PROBNP in the last 8760 hours. CBG:  Recent Labs Lab 08/17/14 2240 08/18/14 0730 08/18/14 1146  GLUCAP 105* 108* 122*    No results found for this or any previous visit (from the past 240 hour(s)).   Studies: Ct Angio Chest Pe W/cm &/or Wo Cm  08/17/2014   CLINICAL DATA:  Pulmonary embolism detected on CT abdomen/ pelvis exam of 08/17/2014  EXAM: CT ANGIOGRAPHY CHEST WITH CONTRAST  TECHNIQUE: Multidetector CT  imaging of the chest was performed using the standard protocol during bolus administration of intravenous contrast. Multiplanar CT image reconstructions and MIPs were obtained to evaluate the vascular anatomy.  CONTRAST:  80mL OMNIPAQUE IOHEXOL 350 MG/ML SOLN  COMPARISON:  CT abdomen and pelvis 08/17/2014, CT abdomen and pelvis 10/10/2010  FINDINGS: Scattered atherosclerotic calcifications aorta and coronary arteries.  Aorta normal caliber without aneurysm or dissection.  Again identified small embolus within RIGHT lower  lobe pulmonary artery no additional pulmonary emboli identified.  LV/RV ratio is elevated at 1.12 though ratio is also elevated at 1.0 on a prior CT from 2012; in light of the small embolus identified on the current study and the previous RIGHT ventricular dilatation, suspect elevated ratio is related to underlying RIGHT ventricular dysfunction rather than RIGHT heart strain.  Visualized upper abdomen unremarkable.  No thoracic adenopathy.  Beam hardening artifacts from LEFT shoulder prosthesis.  Emphysematous changes with mild central peribronchial thickening.  No acute infiltrate, pleural effusion, or pneumothorax.  Bones appear demineralized.  Review of the MIP images confirms the above findings.  IMPRESSION: Small RIGHT lower lobe pulmonary embolus as identified on earlier CT abdomen/ pelvis exam.  No additional pulmonary emboli identified.  COPD changes.  Osseous demineralization.   Electronically Signed   By: Ulyses Southward M.D.   On: 08/17/2014 20:38   Ct Angio Abd/pel W/ And/or W/o  08/17/2014   CLINICAL DATA:  Acute nausea, vomiting, abdominal pain with rectal bleeding 1 week ago.  EXAM: CTA ABDOMEN AND PELVIS WITHOUT CONTRAST  TECHNIQUE: Multidetector CT imaging of the abdomen and pelvis was performed using the standard protocol during bolus administration of intravenous contrast. Multiplanar reconstructed images and MIPs were obtained and reviewed to evaluate the vascular anatomy.  CONTRAST:  OMNIPAQUE IOHEXOL 350 MG/ML SOLN  COMPARISON:  03/17/2013  FINDINGS: Lower chest: Clear lung bases. Right lower lobe pulmonary artery branching filling defect noted, image 12 compatible witha small right lower lobe pulmonary embolus.  Normal heart size. No pericardial or pleural effusion. No hiatal hernia.  Abdomen pelvis: Atherosclerosis noted of the abdominal aorta diffusely without significant aneurysm, dissection, retroperitoneal hemorrhage, or occlusive process. Minimal atherosclerosis of the celiac  origin with slight narrowing, less than 30%. SMA, main renal arteries, and IMA are all visualized and patent. No evidence of significant mesenteric or renal vascular occlusive process. No accessory renal vasculature.  Mild tortuosity of the pelvic iliac vessels without occlusion, dissection or aneurysm. Visualize common femoral, proximal profunda femoral, and proximal superficial femoral arteries demonstrated are all patent. No acute vascular process.  Nonvascular: Liver demonstrates a stable minimally complex septated 16 mm cyst in the right hepatic lobe inferiorly, image 35 series 5. No other significant hepatic abnormality. No biliary dilatation. Patent portal and hepatic veins.  Collapsed gallbladder, biliary system, pancreas, spleen, and adrenal glands are within normal limits for age and demonstrate no acute process.  Kidneys demonstrate no renal obstruction or hydronephrosis. Incidental 5 mm punctate nonobstructing calculus in the right kidney lower pole, image 46.  Negative for bowel obstruction, dilatation, ileus, or free air. No visualized pneumatosis. Similar pattern of colonic diverticulosis, most pronounced in the sigmoid region. No surrounding inflammatory change about the colon.  No abdominal or pelvic free fluid, fluid collection, hemorrhage, abscess or adenopathy. No inguinal abnormality, or hernia. Prostate calcifications noted.  Degenerative changes of the lower lumbar spine and lumbar process.  Review of the MIP images confirms the above findings.  IMPRESSION: Patent mesenteric vasculature. Negative for mesenteric vascular occlusive disease.  Patent renal vasculature as well.  Aortoiliac atherosclerosis without acute process or significant occlusive vascular disease in the abdomen or pelvis.  Small right lower lobe pulmonary embolus.  Stable small right inferior hepatic cyst  Colonic diverticulosis  These results will be called to the ordering clinician or representative by the Radiologist  Assistant, and communication documented in the PACS or zVision Dashboard.   Electronically Signed   By: Ruel Favors M.D.   On: 08/17/2014 15:01    Scheduled Meds: . aspirin  81 mg Oral Daily  . atorvastatin  20 mg Oral Daily  . dicyclomine  10 mg Oral TID AC  . docusate sodium  100 mg Oral BID  . folic acid  1 mg Oral Daily  . insulin aspart  0-9 Units Subcutaneous TID WC  . levothyroxine  125 mcg Oral QAC breakfast  . lisinopril  20 mg Oral Daily  . [START ON 08/23/2014] methotrexate  15 mg Oral Q Mon-1800  . multivitamin with minerals  1 tablet Oral Daily  . pantoprazole  40 mg Oral Daily  . pioglitazone  15 mg Oral Daily  . sodium chloride  3 mL Intravenous Q12H   Continuous Infusions: . sodium chloride 75 mL/hr at 08/18/14 1221  . heparin 1,300 Units/hr (08/18/14 0801)    Principal Problem:   Pulmonary embolism Active Problems:   Hypothyroidism   HYPERLIPIDEMIA   Abdominal pain   Diabetes mellitus type 2, controlled    Time spent: 35 mins    Koa Zoeller M.D. [facility] Pager [phone]. If 7PM-7AM, please contact night-coverage at [email or web address], password [facility] 08/18/2014, 2:06 PM  LOS: 1 day

## 2014-08-18 NOTE — Progress Notes (Signed)
VASCULAR LAB PRELIMINARY  PRELIMINARY  PRELIMINARY  PRELIMINARY  Bilateral lower extremity venous duplex completed.    Preliminary report:  Bilateral:  No evidence of DVT, superficial thrombosis, or Baker's Cyst.   Chennel Olivos, RVS 08/18/2014, 4:29 PM

## 2014-08-18 NOTE — Plan of Care (Signed)
Problem: Phase I Progression Outcomes Goal: Pain controlled with appropriate interventions Outcome: Completed/Met Date Met:  08/18/14 Goal: OOB as tolerated unless otherwise ordered Outcome: Completed/Met Date Met:  08/18/14 Goal: Initial discharge plan identified Outcome: Completed/Met Date Met:  08/18/14 Goal: Voiding-avoid urinary catheter unless indicated Outcome: Completed/Met Date Met:  08/18/14 Goal: Hemodynamically stable Outcome: Completed/Met Date Met:  08/18/14 Goal: Other Phase I Outcomes/Goals Outcome: Completed/Met Date Met:  08/18/14  Problem: Phase II Progression Outcomes Goal: Progress activity as tolerated unless otherwise ordered Outcome: Completed/Met Date Met:  08/18/14 Goal: Discharge plan established Outcome: Completed/Met Date Met:  08/18/14 Goal: Vital signs remain stable Outcome: Completed/Met Date Met:  08/18/14 Goal: Obtain order to discontinue catheter if appropriate Outcome: Not Applicable Date Met:  61/90/12

## 2014-08-18 NOTE — Progress Notes (Signed)
ANTICOAGULATION CONSULT NOTE - Follow Up Consult  Pharmacy Consult for Heparin Indication: pulmonary embolus  Allergies  Allergen Reactions  . Shellfish Allergy     ANGIOEDEMA    Patient Measurements: Height: 6' (182.9 cm) Weight: 209 lb 1.6 oz (94.847 kg) IBW/kg (Calculated) : 77.6 Heparin Dosing Weight: 96 kg  Vital Signs: Temp: 98.2 F (36.8 C) (11/18 1435) Temp Source: Oral (11/18 1435) BP: 116/58 mmHg (11/18 1435) Pulse Rate: 58 (11/18 1435)  Labs:  Recent Labs  08/17/14 1852 08/18/14 0550 08/18/14 1333  HGB 12.7* 11.1*  --   HCT 38.5* 34.3*  --   PLT 270 247  --   LABPROT 13.3  --   --   INR 1.00  --   --   HEPARINUNFRC  --  0.39 0.41  CREATININE 1.28 1.19  --   TROPONINI <0.30  --   --     Estimated Creatinine Clearance: 63.1 mL/min (by C-G formula based on Cr of 1.19).   Medications:  Scheduled:  . aspirin  81 mg Oral Daily  . atorvastatin  20 mg Oral Daily  . dicyclomine  10 mg Oral TID AC  . docusate sodium  100 mg Oral BID  . folic acid  1 mg Oral Daily  . insulin aspart  0-9 Units Subcutaneous TID WC  . levothyroxine  125 mcg Oral QAC breakfast  . lisinopril  20 mg Oral Daily  . [START ON 08/23/2014] methotrexate  15 mg Oral Q Mon-1800  . multivitamin with minerals  1 tablet Oral Daily  . pantoprazole  40 mg Oral Daily  . pioglitazone  15 mg Oral Daily  . sodium chloride  3 mL Intravenous Q12H   Infusions:  . sodium chloride 75 mL/hr at 08/18/14 1221  . heparin 1,300 Units/hr (08/18/14 0801)   PRN: acetaminophen **OR** acetaminophen, ALPRAZolam, HYDROcodone-acetaminophen, methocarbamol, morphine injection, ondansetron **OR** ondansetron (ZOFRAN) IV, zolpidem  Assessment: 78yo M with incidental finding of PE seen on CT of abdomen 11/17. He was being worked up for ischemic colitis d/t emesis, diarrhea, and BRB per rectum on 11/12. Pharmacy is asked to start heparin infusion.   11/18 at 13:33  Heparin level therapeutic 0.41  No  bleeding per discussion with RN  Goal of Therapy:  Heparin level 0.3-0.7 units/ml Monitor platelets by anticoagulation protocol: Yes   Plan:   Continue heparin 1300 units/hr  Daily heparin level, CBC  Follow up plans for long-term anticoagulation  Peggyann Juba, PharmD, BCPS Pager: [phone] 08/18/2014,3:41 PM

## 2014-08-18 NOTE — Plan of Care (Signed)
Problem: Phase III Progression Outcomes Goal: Voiding independently Outcome: Completed/Met Date Met:  08/18/14 Goal: Foley discontinued Outcome: Not Applicable Date Met:  08/18/14     

## 2014-08-18 NOTE — Progress Notes (Signed)
ANTICOAGULATION CONSULT NOTE - Initial Consult  Pharmacy Consult for IV heparin Indication: pulmonary embolus  Allergies  Allergen Reactions  . Shellfish Allergy     ANGIOEDEMA    Patient Measurements: Height: 6' (182.9 cm) Weight: 209 lb 1.6 oz (94.847 kg) IBW/kg (Calculated) : 77.6 Heparin Dosing Weight: 96  Vital Signs: Temp: 98.3 F (36.8 C) (11/18 0450) Temp Source: Oral (11/18 0450) BP: 125/83 mmHg (11/18 0450) Pulse Rate: 52 (11/18 0450)  Labs:  Recent Labs  08/17/14 1852 08/18/14 0550  HGB 12.7* 11.1*  HCT 38.5* 34.3*  PLT 270 247  LABPROT 13.3  --   INR 1.00  --   HEPARINUNFRC  --  0.39  CREATININE 1.28  --   TROPONINI <0.30  --     Estimated Creatinine Clearance: 58.7 mL/min (by C-G formula based on Cr of 1.28).   Medical History: Past Medical History  Diagnosis Date  . Anemia   . Diabetes mellitus   . Hypertension   . Hyperlipidemia   . Diverticulosis   . Testosterone deficiency     111 in 2007  . Reflux esophagitis   . Spinal stenosis     Dr.Ramos  . Hemochromatosis      Dr Henrene Pastor  . Arthritis     Dr Lurline Hare, MTX rx  . Hypothyroidism   . SBO (small bowel obstruction) 2004  . Kidney stones, calcium oxalate     1985 & 2005  . Hx of adenomatous colonic polyps   . Internal hemorrhoids     Medications:  Prescriptions prior to admission  Medication Sig Dispense Refill Last Dose  . ALPRAZolam (XANAX) 0.5 MG tablet Take 0.5 mg by mouth every 8 (eight) hours as needed for anxiety (anxiety).    08/16/2014 at Unknown time  . amoxicillin (AMOXIL) 500 MG tablet Take 500 mg by mouth daily as needed (dental procedure). Take 3 tablets by mouth prior to dental procedure.   Past Week at Unknown time  . aspirin 81 MG tablet Take 81 mg by mouth daily.     08/16/2014 at Unknown time  . atorvastatin (LIPITOR) 20 MG tablet Take 1 tablet (20 mg total) by mouth daily. 90 tablet 1 08/16/2014 at Unknown time  . CALCIUM-VITAMIN D PO Take 1 tablet by mouth  daily.    08/16/2014 at Unknown time  . cholecalciferol (VITAMIN D) 1000 UNITS tablet Take 1,000 Units by mouth daily.   08/16/2014 at Unknown time  . dicyclomine (BENTYL) 10 MG capsule Take 1 capsule (10 mg total) by mouth 3 (three) times daily before meals. 60 capsule 2 08/16/2014 at Unknown time  . diphenhydrAMINE (BENADRYL) 25 mg capsule Take 25 mg by mouth at bedtime as needed for allergies (allergies).    08/16/2014 at Unknown time  . folic acid (FOLVITE) 1 MG tablet Take 1 mg by mouth daily.     08/16/2014 at Unknown time  . glucose blood test strip Check blood sugar once daily, DX:250.00 100 each 11 08/16/2014 at Unknown time  . HYDROcodone-acetaminophen (NORCO) 10-325 MG per tablet Take 1 tablet by mouth every 4 (four) hours as needed for moderate pain or severe pain (pain).    08/17/2014 at Unknown time  . Lancets (FREESTYLE) lancets Test blood sugar daily. Dx code: 250.00 100 each 12 08/16/2014 at Unknown time  . levothyroxine (SYNTHROID, LEVOTHROID) 125 MCG tablet TAKE 1 TABLET BY MOUTH ONCE DAILY 90 tablet 1 08/16/2014  . lisinopril (PRINIVIL,ZESTRIL) 20 MG tablet TAKE 1 TABLET BY MOUTH DAILY 90 tablet  3 08/16/2014  . metFORMIN (GLUCOPHAGE) 500 MG tablet Take 1 tablet (500 mg total) by mouth 2 (two) times daily with a meal. 180 tablet 1 08/16/2014 at Unknown time  . methocarbamol (ROBAXIN) 500 MG tablet Take 500 mg by mouth every 8 (eight) hours as needed for muscle spasms (muscle spasm).   2 Past Month at Unknown time  . methotrexate (RHEUMATREX) 2.5 MG tablet Take 15 mg by mouth once a week. Monday Night.   08/16/2014 at 2000  . multivitamin (THERAGRAN) per tablet Take 1 tablet by mouth daily.     08/16/2014 at Unknown time  . omeprazole (PRILOSEC OTC) 20 MG tablet Take 20 mg by mouth daily.   08/16/2014 at Unknown time  . ondansetron (ZOFRAN) 4 MG tablet Take 1 tab every 6 hours as needed for nausea. 40 tablet 1 08/17/2014 at Unknown time  . pioglitazone (ACTOS) 15 MG tablet Take 1  tablet (15 mg total) by mouth daily. 90 tablet 1 08/16/2014 at Unknown time  . vitamin B-12 (CYANOCOBALAMIN) 1000 MCG tablet Take 1,000 mcg by mouth daily.   08/16/2014 at Unknown time  . ALPRAZolam (XANAX) 0.5 MG tablet TAKE 1 TABLET BY MOUTH EVERY 8-12 HOURS AS NEEDED ONLY.Do NOT TAKE ROUTINELY. (Patient not taking: Reported on 08/17/2014) 90 tablet 0 Taking  . FLUZONE HIGH-DOSE 0.5 ML SUSY   0 Completed Course at Unknown time   Scheduled:  Infusions:   Assessment: 78yo M with incidental finding of PE seen on CT of abdomen 11/17. He was being worked up for ischemic colitis d/t emesis, diarrhea, and BRB per rectum on 11/12. Pharmacy is asked to start heparin infusion.   Baseline coags are wnl.  CBC is ok.  SCr slightly elevated at 1.28.   11/18  HL=0.39 units/ml  Hgb=11.1, Plts=247  No IV interuptions/bleeding per RN    Goal of Therapy:  Heparin level 0.3-0.7 units/ml Monitor platelets by anticoagulation protocol: Yes   Plan:   Increase heparin drip to 1300 units/hr  Recheck heparin level in 8hrs and daily thereafter.  Check CBC q24h while on heparin.  F/u daily.  Lawana Pai R  08/18/2014,7:02 AM.

## 2014-08-18 NOTE — Progress Notes (Signed)
Agree with initial assessment and plans. CT scan did not show abdominal problems, but did show pulmonary embolus. Patient to be admitted and evaluated by appropriate subspecialty team.

## 2014-08-18 NOTE — Progress Notes (Signed)
UR completed 

## 2014-08-19 DIAGNOSIS — K5900 Constipation, unspecified: Secondary | ICD-10-CM | POA: Insufficient documentation

## 2014-08-19 LAB — BASIC METABOLIC PANEL
ANION GAP: 9 (ref 5–15)
BUN: 22 mg/dL (ref 6–23)
CHLORIDE: 103 meq/L (ref 96–112)
CO2: 28 meq/L (ref 19–32)
Calcium: 9.4 mg/dL (ref 8.4–10.5)
Creatinine, Ser: 1.27 mg/dL (ref 0.50–1.35)
GFR calc non Af Amer: 53 mL/min — ABNORMAL LOW (ref >90)
GFR, EST AFRICAN AMERICAN: 62 mL/min — AB (ref >90)
Glucose, Bld: 136 mg/dL — ABNORMAL HIGH (ref 70–99)
Potassium: 5 mEq/L (ref 3.7–5.3)
SODIUM: 140 meq/L (ref 137–147)

## 2014-08-19 LAB — GLUCOSE, CAPILLARY
GLUCOSE-CAPILLARY: 101 mg/dL — AB (ref 70–99)
Glucose-Capillary: 125 mg/dL — ABNORMAL HIGH (ref 70–99)
Glucose-Capillary: 117 mg/dL — ABNORMAL HIGH (ref 70–99)
Glucose-Capillary: 136 mg/dL — ABNORMAL HIGH (ref 70–99)

## 2014-08-19 LAB — CBC
HCT: 38.9 % — ABNORMAL LOW (ref 39.0–52.0)
Hemoglobin: 12.7 g/dL — ABNORMAL LOW (ref 13.0–17.0)
MCH: 29.7 pg (ref 26.0–34.0)
MCHC: 32.6 g/dL (ref 30.0–36.0)
MCV: 91.1 fL (ref 78.0–100.0)
Platelets: 272 10*3/uL (ref 150–400)
RBC: 4.27 MIL/uL (ref 4.22–5.81)
RDW: 14.8 % (ref 11.5–15.5)
WBC: 6.9 10*3/uL (ref 4.0–10.5)

## 2014-08-19 LAB — HEPARIN LEVEL (UNFRACTIONATED): HEPARIN UNFRACTIONATED: 0.65 [IU]/mL (ref 0.30–0.70)

## 2014-08-19 MED ORDER — HEPARIN (PORCINE) IN NACL 100-0.45 UNIT/ML-% IJ SOLN
1250.0000 [IU]/h | INTRAMUSCULAR | Status: DC
  Administered 2014-08-19: 1250 [IU]/h via INTRAVENOUS
  Filled 2014-08-19 (×2): qty 250

## 2014-08-19 MED ORDER — RIVAROXABAN 15 MG PO TABS
15.0000 mg | ORAL_TABLET | Freq: Two times a day (BID) | ORAL | Status: DC
  Administered 2014-08-19 – 2014-08-20 (×2): 15 mg via ORAL
  Filled 2014-08-19 (×3): qty 1

## 2014-08-19 MED ORDER — SORBITOL 70 % SOLN
30.0000 mL | Freq: Every day | Status: DC | PRN
  Administered 2014-08-19 (×2): 30 mL via ORAL
  Filled 2014-08-19 (×3): qty 30

## 2014-08-19 MED ORDER — RIVAROXABAN 20 MG PO TABS: 20.0000 mg | ORAL_TABLET | Freq: Every day | ORAL | Status: DC

## 2014-08-19 NOTE — Progress Notes (Signed)
[facility] PROGRESS NOTE  Louis [name]:[id number] DOB: 1936/03/14 DOA: 08/17/2014 PCP: Marga Melnick, MD  Assessment/Plan: #1 acute pulmonary emboli Questionable etiology. Patient also ready been started on anticoagulation and as such hypercoagulable panel cannot be obtained at this time. Lower extremity Dopplers have been obtained which are negative for DVT. Patient currently on IV heparin. Once patient have his bowel movement and if nonbloody will transition to an oral anticoagulation agent, as patient with recent history of lower GI bleed.  #2 abdominal pain with rectal bleeding CT of abdomen and pelvis was unremarkable. Patient currently tolerating the liquid diet. No further rectal bleeding. Patient with no further abdominal pain. Abdominal x-ray consistent with constipation. Follow.  #3 constipation Patient on Mira lax twice daily however no bowel movement. Will try dose of sorbitol. If no bowel movement with sorbitol, will give a soapsuds enema.  #4 type 2 diabetes Sliding scale insulin. D/C Actos.  #5 hypertension Stable. Continue home regimen of lisinopril.  #6 hyperlipidemia Continue Lipitor.  #7 hypothyroidism Continue Synthroid.  #8 prophylaxis PPI for GI prophylaxis. On heparin for DVT prophylaxis.  Code Status: full Family Communication: updated patient and friend at bedside. Disposition Plan: Home when on oral anticoagulation hopefully in 1-2 days.   Consultants:  NONE  Procedures:  CT angiogram chest 08/17/2014  CT abdomen and pelvis 08/17/2014  Bilateral lower extremity Dopplers 08/18/2014  Antibiotics:  none  HPI/Subjective: Patient with no complaints. Patient denies any rectal bleeding. Patient states hasn't had a bowel movement in approximately 1 week. Patient passing gas.  Objective: Filed Vitals:   08/19/14 1258  BP: 129/62  Pulse: 53  Temp: 98 F (36.7 C)  Resp: 20    Intake/Output Summary (Last 24 hours) at  08/19/14 1928 Last data filed at 08/19/14 1707  Gross per 24 hour  Intake    840 ml  Output   1180 ml  Net   -340 ml   Filed Weights   08/17/14 2116 08/17/14 2220  Weight: 93.895 kg (207 lb) 94.847 kg (209 lb 1.6 oz)    Exam:   General:  NAD  Cardiovascular: RRR  Respiratory: CTAB  Abdomen: soft, nontender, nondistended, positive bowel sounds.  Musculoskeletal: no clubbing cyanosis or edema.  Data Reviewed: Basic Metabolic Panel:  Recent Labs Lab 08/13/14 1605 08/17/14 1852 08/18/14 0550 08/19/14 0510  NA 141 137 141 140  K 4.4 4.2 4.2 5.0  CL 103 98 104 103  CO2 21 28 26 28   GLUCOSE 118* 123* 110* 136*  BUN 24* 21 21 22   CREATININE 1.2 1.28 1.19 1.27  CALCIUM 9.3 9.4 8.5 9.4   Liver Function Tests:  Recent Labs Lab 08/13/14 1605 08/17/14 1852 08/18/14 0550  AST 15 13 13   ALT 13 11 10   ALKPHOS 63 68 58  BILITOT 0.5 0.3 0.3  PROT 7.5 7.4 6.3  ALBUMIN 3.9 4.0 3.5    Recent Labs Lab 08/17/14 1852  LIPASE 39   No results for input(s): AMMONIA in the last 168 hours. CBC:  Recent Labs Lab 08/13/14 1605 08/17/14 1852 08/18/14 0550 08/19/14 0837  WBC 11.3* 7.8 8.1 6.9  NEUTROABS 8.5* 4.5 4.9  --   HGB 12.8* 12.7* 11.1* 12.7*  HCT 39.9 38.5* 34.3* 38.9*  MCV 91.0 90.8 92.2 91.1  PLT 281.0 270 247 272   Cardiac Enzymes:  Recent Labs Lab 08/17/14 1852  TROPONINI <0.30   BNP (last 3 results) No results for input(s): PROBNP in the last 8760 hours. CBG:  Recent  Labs Lab 08/18/14 1715 08/18/14 2044 08/19/14 0735 08/19/14 1158 08/19/14 1657  GLUCAP 137* 103* 101* 125* 117*    No results found for this or any previous visit (from the past 240 hour(s)).   Studies: Ct Angio Chest Pe W/cm &/or Wo Cm  08/17/2014   CLINICAL DATA:  Pulmonary embolism detected on CT abdomen/ pelvis exam of 08/17/2014  EXAM: CT ANGIOGRAPHY CHEST WITH CONTRAST  TECHNIQUE: Multidetector CT imaging of the chest was performed using the standard protocol  during bolus administration of intravenous contrast. Multiplanar CT image reconstructions and MIPs were obtained to evaluate the vascular anatomy.  CONTRAST:  80mL OMNIPAQUE IOHEXOL 350 MG/ML SOLN  COMPARISON:  CT abdomen and pelvis 08/17/2014, CT abdomen and pelvis 10/10/2010  FINDINGS: Scattered atherosclerotic calcifications aorta and coronary arteries.  Aorta normal caliber without aneurysm or dissection.  Again identified small embolus within RIGHT lower lobe pulmonary artery no additional pulmonary emboli identified.  LV/RV ratio is elevated at 1.12 though ratio is also elevated at 1.0 on a prior CT from 2012; in light of the small embolus identified on the current study and the previous RIGHT ventricular dilatation, suspect elevated ratio is related to underlying RIGHT ventricular dysfunction rather than RIGHT heart strain.  Visualized upper abdomen unremarkable.  No thoracic adenopathy.  Beam hardening artifacts from LEFT shoulder prosthesis.  Emphysematous changes with mild central peribronchial thickening.  No acute infiltrate, pleural effusion, or pneumothorax.  Bones appear demineralized.  Review of the MIP images confirms the above findings.  IMPRESSION: Small RIGHT lower lobe pulmonary embolus as identified on earlier CT abdomen/ pelvis exam.  No additional pulmonary emboli identified.  COPD changes.  Osseous demineralization.   Electronically Signed   By: Ulyses Southward M.D.   On: 08/17/2014 20:38   Dg Abd 2 Views  08/18/2014   CLINICAL DATA:  Nausea, vomiting, constipation.  EXAM: ABDOMEN - 2 VIEW  COMPARISON:  None.  FINDINGS: There is nonspecific nonobstructive bowel gas pattern. Moderate stool noted in right colon and transverse colon. Some stool and gas noted in rectosigmoid colon. No free abdominal air.  IMPRESSION: Nonspecific nonobstructive bowel gas pattern. Moderate stool in proximal colon.   Electronically Signed   By: Natasha Mead M.D.   On: 08/18/2014 15:30    Scheduled Meds: . aspirin   81 mg Oral Daily  . atorvastatin  20 mg Oral Daily  . dicyclomine  10 mg Oral TID AC  . docusate sodium  100 mg Oral BID  . folic acid  1 mg Oral Daily  . insulin aspart  0-9 Units Subcutaneous TID WC  . levothyroxine  125 mcg Oral QAC breakfast  . lisinopril  20 mg Oral Daily  . [START ON 08/23/2014] methotrexate  15 mg Oral Q Mon-1800  . multivitamin with minerals  1 tablet Oral Daily  . pantoprazole  40 mg Oral Daily  . polyethylene glycol  17 g Oral BID  . rivaroxaban  15 mg Oral BID WC   Followed by  . [START ON 09/10/2014] rivaroxaban  20 mg Oral Q breakfast  . sodium chloride  3 mL Intravenous Q12H   Continuous Infusions:    Principal Problem:   Pulmonary embolism Active Problems:   Hypothyroidism   HYPERLIPIDEMIA   Abdominal pain   Diabetes mellitus type 2, controlled    Time spent: 35 mins    Jennine Peddy M.D. [facility] Pager [phone]. If 7PM-7AM, please contact night-coverage at [email or web address], password [facility] 08/19/2014, 7:28 PM  LOS: 2 days

## 2014-08-19 NOTE — Plan of Care (Signed)
Problem: Phase II Progression Outcomes Goal: IV changed to normal saline lock Outcome: Completed/Met Date Met:  08/19/14 Goal: Other Phase II Outcomes/Goals Outcome: Completed/Met Date Met:  08/19/14  Problem: Phase III Progression Outcomes Goal: Activity at appropriate level-compared to baseline (UP IN CHAIR FOR HEMODIALYSIS)  Outcome: Completed/Met Date Met:  08/19/14

## 2014-08-19 NOTE — Progress Notes (Signed)
ANTICOAGULATION CONSULT NOTE - Follow Up Consult  Pharmacy Consult for Heparin Indication: pulmonary embolus  Allergies  Allergen Reactions  . Shellfish Allergy     ANGIOEDEMA    Patient Measurements: Height: 6' (182.9 cm) Weight: 209 lb 1.6 oz (94.847 kg) IBW/kg (Calculated) : 77.6 Heparin Dosing Weight: 96 kg  Vital Signs: Temp: 98 F (36.7 C) (11/19 0516) Temp Source: Oral (11/19 0516) BP: 112/50 mmHg (11/19 0516) Pulse Rate: 49 (11/19 0516)  Labs:  Recent Labs  08/17/14 1852 08/18/14 0550 08/18/14 1333 08/19/14 0510 08/19/14 0837  HGB 12.7* 11.1*  --   --  12.7*  HCT 38.5* 34.3*  --   --  38.9*  PLT 270 247  --   --  272  LABPROT 13.3  --   --   --   --   INR 1.00  --   --   --   --   HEPARINUNFRC  --  0.39 0.41 0.65  --   CREATININE 1.28 1.19  --  1.27  --   TROPONINI <0.30  --   --   --   --     Estimated Creatinine Clearance: 59.1 mL/min (by C-G formula based on Cr of 1.27).  Infusions:  . heparin 1,300 Units/hr (08/18/14 1906)   PRN: acetaminophen **OR** acetaminophen, ALPRAZolam, HYDROcodone-acetaminophen, methocarbamol, morphine injection, ondansetron **OR** ondansetron (ZOFRAN) IV, sorbitol, zolpidem  Assessment: 78yo M with incidental finding of PE seen on CT of abdomen 11/17. He was being worked up for ischemic colitis d/t emesis, diarrhea, and BRB per rectum on 11/12. He has h/o severe diverticular disease. LE duplex negative for DVT. Pharmacy dosing start heparin infusion.   11/19:  Heparin level therapeutic 0.65 (trending up)  CBC: Hgb = 12.7 (improved), pltc WNL  No bleeding per discussion with RN  Goal of Therapy:  Heparin level 0.3-0.7 units/ml Monitor platelets by anticoagulation protocol: Yes   Plan:   Reduce heparin gtt to 1250 units/hr for heparin level trending up  Daily heparin level, CBC  Follow up plans for long-term anticoagulation - awaiting to have BM to ensure no further  GI Bleeding.  Patient prefers novel  anticoagulant  Doreene Eland, PharmD, BCPS.   Pager: [phone] 08/19/2014,10:33 AM

## 2014-08-19 NOTE — Progress Notes (Addendum)
ANTICOAGULATION CONSULT NOTE -   Pharmacy Consult for Xarelto Indication: pulmonary embolus  Allergies  Allergen Reactions  . Shellfish Allergy     ANGIOEDEMA   Patient Measurements: Height: 6' (182.9 cm) Weight: 209 lb 1.6 oz (94.847 kg) IBW/kg (Calculated) : 77.6  Vital Signs: Temp: 98 F (36.7 C) (11/19 1258) Temp Source: Oral (11/19 1258) BP: 129/62 mmHg (11/19 1258) Pulse Rate: 53 (11/19 1258)  Labs:  Recent Labs  08/17/14 1852 08/18/14 0550 08/18/14 1333 08/19/14 0510 08/19/14 0837  HGB 12.7* 11.1*  --   --  12.7*  HCT 38.5* 34.3*  --   --  38.9*  PLT 270 247  --   --  272  LABPROT 13.3  --   --   --   --   INR 1.00  --   --   --   --   HEPARINUNFRC  --  0.39 0.41 0.65  --   CREATININE 1.28 1.19  --  1.27  --   TROPONINI <0.30  --   --   --   --    Estimated Creatinine Clearance: 59.1 mL/min (by C-G formula based on Cr of 1.27).  Assessment: 78yo M with incidental finding of PE seen on CT of abdomen 11/17. He was being worked up for ischemic colitis d/t emesis, diarrhea, and BRB per rectum on 11/12. He has h/o severe diverticular disease. LE duplex negative for DVT. Pharmacy began heparin infusion 11/17.  11/19:  Heparin level was therapeutic this am  CBC: Hgb = 12.7 (improved), pltc WNL  No bleeding per discussion with RN  Change anti-coagulation to Xarelto  Goal of Therapy:  No lab monitoring for Xarelto, follow CBC, bleeding  Plan:   Stop Heparin infusion  Begin Xarelto 15mg  bid x 21 days, followed by Xarelto 20mg  daily  Xarelto education by Pharmacy  Minda Ditto PharmD Pager [phone] 08/19/2014, 6:21 PM

## 2014-08-20 LAB — BASIC METABOLIC PANEL
Anion gap: 15 (ref 5–15)
BUN: 22 mg/dL (ref 6–23)
CALCIUM: 9.2 mg/dL (ref 8.4–10.5)
CHLORIDE: 101 meq/L (ref 96–112)
CO2: 25 meq/L (ref 19–32)
Creatinine, Ser: 1.33 mg/dL (ref 0.50–1.35)
GFR calc Af Amer: 58 mL/min — ABNORMAL LOW (ref >90)
GFR calc non Af Amer: 50 mL/min — ABNORMAL LOW (ref >90)
Glucose, Bld: 113 mg/dL — ABNORMAL HIGH (ref 70–99)
Potassium: 4.8 mEq/L (ref 3.7–5.3)
SODIUM: 141 meq/L (ref 137–147)

## 2014-08-20 LAB — GLUCOSE, CAPILLARY
GLUCOSE-CAPILLARY: 100 mg/dL — AB (ref 70–99)
GLUCOSE-CAPILLARY: 138 mg/dL — AB (ref 70–99)

## 2014-08-20 LAB — MAGNESIUM: MAGNESIUM: 2.2 mg/dL (ref 1.5–2.5)

## 2014-08-20 MED ORDER — RIVAROXABAN (XARELTO) VTE STARTER PACK (15 & 20 MG): ORAL_TABLET | ORAL | Status: DC

## 2014-08-20 MED ORDER — POLYETHYLENE GLYCOL 3350 17 G PO PACK: 17.0000 g | PACK | Freq: Two times a day (BID) | ORAL | Status: DC

## 2014-08-20 MED ORDER — SENNOSIDES-DOCUSATE SODIUM 8.6-50 MG PO TABS: 1.0000 | ORAL_TABLET | Freq: Every day | ORAL | Status: DC

## 2014-08-20 NOTE — Discharge Summary (Signed)
Physician Discharge Summary  Louis [name] SWN:[id number] DOB: November 25, 1935 DOA: 08/17/2014  PCP: Marga Melnick, MD  Admit date: 08/17/2014 Discharge date: 08/20/2014  Time spent: 70 minutes  Recommendations for Outpatient Follow-up:  1. Follow-up with Marga Melnick, MD in 1-2 weeks. On follow-up patient's PE will need to be reassessed. Patient need a CBC done to follow-up on his hemoglobin. Duration of anticoagulation will be deferred to PCP. Once patient is off his anticoagulation he will likely need a hypercoagulable panel done.  Discharge Diagnoses:  Principal Problem:   Pulmonary embolism Active Problems:   Hypothyroidism   HYPERLIPIDEMIA   Abdominal pain   Diabetes mellitus type 2, controlled   CN (constipation)   Discharge Condition: Stable and improved  Diet recommendation: Carb modified  Filed Weights   08/17/14 2116 08/17/14 2220  Weight: 93.895 kg (207 lb) 94.847 kg (209 lb 1.6 oz)    History of present illness:  Louis [name] is a 78 y.o. male with history of diabetes mellitus type 2 chronic anemia, hypertension, hyperlipidemia, arthritis and hypothyroidism was experiencing abdominal pain with rectal bleeding 1 week prior to admission and had gone to his gastroenterologist who had ordered CT angiogram of abdomen to rule out any ischemic process during which patient was found to have a right lower lobe pulmonary embolism and was referred to the ER. In the ED, patient had CT angiogram of the chest which confirmed the right lower lobe pulmonary embolism. Patient was hemodynamically stable, denied any chest pain or shortness of breath. Patient had multiple bowel movements last Wednesday, one week prior to admission and eventually started having large amount of rectal bleeding. Patient stated he had at least 3 episodes of rectal bleeding and abdominal pain across the abdomen. Since last Wednesday patient had not had any further bowel movement but had been having  persistent abdominal pain. Had occasional nausea vomiting. CT of the abdomen was unremarkable. Since patient has had bleeding per rectum and will need to be on anticoagulants patient be admitted for observation and will be started on heparin for his pulmonary embolism.   Hospital Course:  #1 acute pulmonary emboli Questionable etiology. Patient on admission was already started on anticoagulation and as such hypercoagulable panel could not be obtained at this time. Lower extremity Dopplers have been obtained which are negative for DVT. Patient was started on IV heparin. Due to patient's recent history of rectal bleeding patient's bowel movements were monitored. Patient had a bowel movement with no bleeding. IV heparin was subsequently transitioned to oral xarelto which patient tolerated. Patient be discharged home on xarelto will likely need to be on it for 3-6 months. Patient will follow-up with PCP as outpatient. Once patient is off anticoagulation may need a hypercoagulable panel done 2 weeks after anticoagulation. As this is patient's first event patient will likely need to be on anticoagulation for 3-6 months however this will be deferred to PCP.   #2 abdominal pain with rectal bleeding CT of abdomen and pelvis was unremarkable. Patient was placed on a diet which he tolerated. Patient did not have any further rectal bleeding during the hospitalization. Abdominal x-rays which were done with consistent with constipation. Patient was placed on a bowel regimen as well as given an enema with good results. Patient did not have any bleeding with his bowel movements. Outpatient follow-up.   #3 constipation Patient was placed on a bowel regimen of miralax twice daily as well as a stool softener. Patient was given some sorbitol and eventually a  soapsuds enema with a goal bowel movement. Patient be discharged home on a bowel regimen of Mira lax twice daily as well as Senokot-S at bedtime.   #4 type 2  diabetes Patient's oral hypoglycemic agents were held during the hospitalization patient was maintained on a sliding scale insulin.   #5 hypertension Stable. Continued on home regimen of lisinopril.  #6 hyperlipidemia Continued on home regimen of Lipitor.  #7 hypothyroidism Continued on home regimen of Synthroid.   Procedures:  CT angiogram chest 08/17/2014  CT abdomen and pelvis 08/17/2014  Bilateral lower extremity Dopplers 08/18/2014  Consultations:  None  Discharge Exam: Filed Vitals:   08/20/14 0602  BP: 129/68  Pulse: 42  Temp: 98.2 F (36.8 C)  Resp: 18    General: NAD Cardiovascular: RRR Respiratory: CTAB  Discharge Instructions You were cared for by a hospitalist during your hospital stay. If you have any questions about your discharge medications or the care you received while you were in the hospital after you are discharged, you can call the unit and asked to speak with the hospitalist on call if the hospitalist that took care of you is not available. Once you are discharged, your primary care physician will handle any further medical issues. Please note that NO REFILLS for any discharge medications will be authorized once you are discharged, as it is imperative that you return to your primary care physician (or establish a relationship with a primary care physician if you do not have one) for your aftercare needs so that they can reassess your need for medications and monitor your lab values.  Discharge Instructions    Diet Carb Modified    Complete by:  As directed      Discharge instructions    Complete by:  As directed   Follow up with Marga Melnick, MD in 1-2 weeks.     Increase activity slowly    Complete by:  As directed           Current Discharge Medication List    START taking these medications   Details  polyethylene glycol (MIRALAX / GLYCOLAX) packet Take 17 g by mouth 2 (two) times daily. Qty: 14 each, Refills: 0    Rivaroxaban  (XARELTO STARTER PACK) 15 & 20 MG TBPK Take as directed on package: Start with one 15mg  tablet by mouth twice a day with food. On Day 22, switch to one 20mg  tablet once a day with food. Qty: 51 each, Refills: 0    senna-docusate (SENOKOT-S) 8.6-50 MG per tablet Take 1 tablet by mouth daily.      CONTINUE these medications which have NOT CHANGED   Details  ALPRAZolam (XANAX) 0.5 MG tablet Take 0.5 mg by mouth every 8 (eight) hours as needed for anxiety (anxiety).     amoxicillin (AMOXIL) 500 MG tablet Take 500 mg by mouth daily as needed (dental procedure). Take 3 tablets by mouth prior to dental procedure.    atorvastatin (LIPITOR) 20 MG tablet Take 1 tablet (20 mg total) by mouth daily. Qty: 90 tablet, Refills: 1   Associated Diagnoses: Type II or unspecified type diabetes mellitus with peripheral circulatory disorders, uncontrolled(250.72)    CALCIUM-VITAMIN D PO Take 1 tablet by mouth daily.     cholecalciferol (VITAMIN D) 1000 UNITS tablet Take 1,000 Units by mouth daily.    dicyclomine (BENTYL) 10 MG capsule Take 1 capsule (10 mg total) by mouth 3 (three) times daily before meals. Qty: 60 capsule, Refills: 2    diphenhydrAMINE (  BENADRYL) 25 mg capsule Take 25 mg by mouth at bedtime as needed for allergies (allergies).     folic acid (FOLVITE) 1 MG tablet Take 1 mg by mouth daily.      glucose blood test strip Check blood sugar once daily, DX:250.00 Qty: 100 each, Refills: 11    HYDROcodone-acetaminophen (NORCO) 10-325 MG per tablet Take 1 tablet by mouth every 4 (four) hours as needed for moderate pain or severe pain (pain).     Lancets (FREESTYLE) lancets Test blood sugar daily. Dx code: 250.00 Qty: 100 each, Refills: 12    levothyroxine (SYNTHROID, LEVOTHROID) 125 MCG tablet TAKE 1 TABLET BY MOUTH ONCE DAILY Qty: 90 tablet, Refills: 1    lisinopril (PRINIVIL,ZESTRIL) 20 MG tablet TAKE 1 TABLET BY MOUTH DAILY Qty: 90 tablet, Refills: 3   Associated Diagnoses: HTN  (hypertension)    metFORMIN (GLUCOPHAGE) 500 MG tablet Take 1 tablet (500 mg total) by mouth 2 (two) times daily with a meal. Qty: 180 tablet, Refills: 1   Associated Diagnoses: Type II or unspecified type diabetes mellitus with peripheral circulatory disorders, uncontrolled(250.72)    methocarbamol (ROBAXIN) 500 MG tablet Take 500 mg by mouth every 8 (eight) hours as needed for muscle spasms (muscle spasm).  Refills: 2    methotrexate (RHEUMATREX) 2.5 MG tablet Take 15 mg by mouth once a week. Monday Night.    multivitamin (THERAGRAN) per tablet Take 1 tablet by mouth daily.      omeprazole (PRILOSEC OTC) 20 MG tablet Take 20 mg by mouth daily.    ondansetron (ZOFRAN) 4 MG tablet Take 1 tab every 6 hours as needed for nausea. Qty: 40 tablet, Refills: 1    pioglitazone (ACTOS) 15 MG tablet Take 1 tablet (15 mg total) by mouth daily. Qty: 90 tablet, Refills: 1   Associated Diagnoses: Type II or unspecified type diabetes mellitus with peripheral circulatory disorders, uncontrolled(250.72)    vitamin B-12 (CYANOCOBALAMIN) 1000 MCG tablet Take 1,000 mcg by mouth daily.    FLUZONE HIGH-DOSE 0.5 ML SUSY Refills: 0      STOP taking these medications     aspirin 81 MG tablet        Allergies  Allergen Reactions  . Shellfish Allergy     ANGIOEDEMA   Follow-up Information    Follow up with Marga Melnick, MD. Schedule an appointment as soon as possible for a visit in 2 weeks.   Specialty:  Internal Medicine   Why:  f/u in 1-2 weeks   Contact information:   [address] [place] [address] [phone]        The results of significant diagnostics from this hospitalization (including imaging, microbiology, ancillary and laboratory) are listed below for reference.    Significant Diagnostic Studies: Ct Angio Chest Pe W/cm &/or Wo Cm  08/17/2014   CLINICAL DATA:  Pulmonary embolism detected on CT abdomen/ pelvis exam of 08/17/2014  EXAM: CT ANGIOGRAPHY CHEST WITH CONTRAST   TECHNIQUE: Multidetector CT imaging of the chest was performed using the standard protocol during bolus administration of intravenous contrast. Multiplanar CT image reconstructions and MIPs were obtained to evaluate the vascular anatomy.  CONTRAST:  80mL OMNIPAQUE IOHEXOL 350 MG/ML SOLN  COMPARISON:  CT abdomen and pelvis 08/17/2014, CT abdomen and pelvis 10/10/2010  FINDINGS: Scattered atherosclerotic calcifications aorta and coronary arteries.  Aorta normal caliber without aneurysm or dissection.  Again identified small embolus within RIGHT lower lobe pulmonary artery no additional pulmonary emboli identified.  LV/RV ratio is elevated at  1.12 though ratio is also elevated at 1.0 on a prior CT from 2012; in light of the small embolus identified on the current study and the previous RIGHT ventricular dilatation, suspect elevated ratio is related to underlying RIGHT ventricular dysfunction rather than RIGHT heart strain.  Visualized upper abdomen unremarkable.  No thoracic adenopathy.  Beam hardening artifacts from LEFT shoulder prosthesis.  Emphysematous changes with mild central peribronchial thickening.  No acute infiltrate, pleural effusion, or pneumothorax.  Bones appear demineralized.  Review of the MIP images confirms the above findings.  IMPRESSION: Small RIGHT lower lobe pulmonary embolus as identified on earlier CT abdomen/ pelvis exam.  No additional pulmonary emboli identified.  COPD changes.  Osseous demineralization.   Electronically Signed   By: Ulyses Southward M.D.   On: 08/17/2014 20:38   Dg Abd 2 Views  08/18/2014   CLINICAL DATA:  Nausea, vomiting, constipation.  EXAM: ABDOMEN - 2 VIEW  COMPARISON:  None.  FINDINGS: There is nonspecific nonobstructive bowel gas pattern. Moderate stool noted in right colon and transverse colon. Some stool and gas noted in rectosigmoid colon. No free abdominal air.  IMPRESSION: Nonspecific nonobstructive bowel gas pattern. Moderate stool in proximal colon.    Electronically Signed   By: Natasha Mead M.D.   On: 08/18/2014 15:30   Ct Angio Abd/pel W/ And/or W/o  08/17/2014   CLINICAL DATA:  Acute nausea, vomiting, abdominal pain with rectal bleeding 1 week ago.  EXAM: CTA ABDOMEN AND PELVIS WITHOUT CONTRAST  TECHNIQUE: Multidetector CT imaging of the abdomen and pelvis was performed using the standard protocol during bolus administration of intravenous contrast. Multiplanar reconstructed images and MIPs were obtained and reviewed to evaluate the vascular anatomy.  CONTRAST:  OMNIPAQUE IOHEXOL 350 MG/ML SOLN  COMPARISON:  03/17/2013  FINDINGS: Lower chest: Clear lung bases. Right lower lobe pulmonary artery branching filling defect noted, image 12 compatible witha small right lower lobe pulmonary embolus.  Normal heart size. No pericardial or pleural effusion. No hiatal hernia.  Abdomen pelvis: Atherosclerosis noted of the abdominal aorta diffusely without significant aneurysm, dissection, retroperitoneal hemorrhage, or occlusive process. Minimal atherosclerosis of the celiac origin with slight narrowing, less than 30%. SMA, main renal arteries, and IMA are all visualized and patent. No evidence of significant mesenteric or renal vascular occlusive process. No accessory renal vasculature.  Mild tortuosity of the pelvic iliac vessels without occlusion, dissection or aneurysm. Visualize common femoral, proximal profunda femoral, and proximal superficial femoral arteries demonstrated are all patent. No acute vascular process.  Nonvascular: Liver demonstrates a stable minimally complex septated 16 mm cyst in the right hepatic lobe inferiorly, image 35 series 5. No other significant hepatic abnormality. No biliary dilatation. Patent portal and hepatic veins.  Collapsed gallbladder, biliary system, pancreas, spleen, and adrenal glands are within normal limits for age and demonstrate no acute process.  Kidneys demonstrate no renal obstruction or hydronephrosis. Incidental  5 mm punctate nonobstructing calculus in the right kidney lower pole, image 46.  Negative for bowel obstruction, dilatation, ileus, or free air. No visualized pneumatosis. Similar pattern of colonic diverticulosis, most pronounced in the sigmoid region. No surrounding inflammatory change about the colon.  No abdominal or pelvic free fluid, fluid collection, hemorrhage, abscess or adenopathy. No inguinal abnormality, or hernia. Prostate calcifications noted.  Degenerative changes of the lower lumbar spine and lumbar process.  Review of the MIP images confirms the above findings.  IMPRESSION: Patent mesenteric vasculature. Negative for mesenteric vascular occlusive disease.  Patent renal vasculature as well.  Aortoiliac atherosclerosis without acute process or significant occlusive vascular disease in the abdomen or pelvis.  Small right lower lobe pulmonary embolus.  Stable small right inferior hepatic cyst  Colonic diverticulosis  These results will be called to the ordering clinician or representative by the Radiologist Assistant, and communication documented in the PACS or zVision Dashboard.   Electronically Signed   By: Ruel Favors M.D.   On: 08/17/2014 15:01    Microbiology: No results found for this or any previous visit (from the past 240 hour(s)).   Labs: Basic Metabolic Panel:  Recent Labs Lab 08/13/14 1605 08/17/14 1852 08/18/14 0550 08/19/14 0510 08/20/14 0540  NA 141 137 141 140 141  K 4.4 4.2 4.2 5.0 4.8  CL 103 98 104 103 101  CO2 21 28 26 28 25   GLUCOSE 118* 123* 110* 136* 113*  BUN 24* 21 21 22 22   CREATININE 1.2 1.28 1.19 1.27 1.33  CALCIUM 9.3 9.4 8.5 9.4 9.2  MG  --   --   --   --  2.2   Liver Function Tests:  Recent Labs Lab 08/13/14 1605 08/17/14 1852 08/18/14 0550  AST 15 13 13   ALT 13 11 10   ALKPHOS 63 68 58  BILITOT 0.5 0.3 0.3  PROT 7.5 7.4 6.3  ALBUMIN 3.9 4.0 3.5    Recent Labs Lab 08/17/14 1852  LIPASE 39   No results for input(s): AMMONIA in  the last 168 hours. CBC:  Recent Labs Lab 08/13/14 1605 08/17/14 1852 08/18/14 0550 08/19/14 0837  WBC 11.3* 7.8 8.1 6.9  NEUTROABS 8.5* 4.5 4.9  --   HGB 12.8* 12.7* 11.1* 12.7*  HCT 39.9 38.5* 34.3* 38.9*  MCV 91.0 90.8 92.2 91.1  PLT 281.0 270 247 272   Cardiac Enzymes:  Recent Labs Lab 08/17/14 1852  TROPONINI <0.30   BNP: BNP (last 3 results) No results for input(s): PROBNP in the last 8760 hours. CBG:  Recent Labs Lab 08/19/14 1158 08/19/14 1657 08/19/14 2206 08/20/14 0734 08/20/14 1140  GLUCAP 125* 117* 136* 100* 138*       Signed:  Opaline Reyburn MD [facility] 08/20/2014, 1:29 PM

## 2014-08-20 NOTE — Discharge Instructions (Signed)
Information on my medicine - XARELTO (rivaroxaban)  This medication education was reviewed with me or my healthcare representative as part of my discharge preparation.  The pharmacist that spoke with me during my hospital stay was:  Ventura Bruns, PharmD  WHY WAS [place]? Xarelto was prescribed to treat blood clots that may have been found in the veins of your legs (deep vein thrombosis) or in your lungs (pulmonary embolism) and to reduce the risk of them occurring again.  What do you need to know about Xarelto? The starting dose is one 15 mg tablet taken TWICE daily with food for the FIRST 21 DAYS then on  Friday 09/10/2014  the dose is changed to one 20 mg tablet taken ONCE A DAY with your evening meal.  DO NOT stop taking Xarelto without talking to the health care provider who prescribed the medication.  Refill your prescription for 20 mg tablets before you run out.  After discharge, you should have regular check-up appointments with your healthcare provider that is prescribing your Xarelto.  In the future your dose may need to be changed if your kidney function changes by a significant amount.  What do you do if you miss a dose? If you are taking Xarelto TWICE DAILY and you miss a dose, take it as soon as you remember. You may take two 15 mg tablets (total 30 mg) at the same time then resume your regularly scheduled 15 mg twice daily the next day.  If you are taking Xarelto ONCE DAILY and you miss a dose, take it as soon as you remember on the same day then continue your regularly scheduled once daily regimen the next day. Do not take two doses of Xarelto at the same time.   Important Safety Information Xarelto is a blood thinner medicine that can cause bleeding. You should call your healthcare provider right away if you experience any of the following: ? Bleeding from an injury or your nose that does not stop. ? Unusual colored urine (red or dark brown) or  unusual colored stools (red or black). ? Unusual bruising for unknown reasons. ? A serious fall or if you hit your head (even if there is no bleeding).  Some medicines may interact with Xarelto and might increase your risk of bleeding while on Xarelto. To help avoid this, consult your healthcare provider or pharmacist prior to using any new prescription or non-prescription medications, including herbals, vitamins, non-steroidal anti-inflammatory drugs (NSAIDs) and supplements.  This website has more information on Xarelto: [email or web address]

## 2014-08-20 NOTE — Care Management Note (Signed)
    Page 1 of 1   08/20/2014     8:28:31 PM CARE MANAGEMENT NOTE 08/20/2014  Patient:  RICARD, FAULKNER   Account Number:  [id number]  Date Initiated:  08/20/2014  Documentation initiated by:  Dessa Phi  Subjective/Objective Assessment:   78 Y/O M ADMITTED W/PE.     Action/Plan:   FROM HOME.   Anticipated DC Date:  08/20/2014   Anticipated DC Plan:  [place]  CM consult      Choice offered to / List presented to:             Status of service:  Completed, signed off Medicare Important Message given?  YES (If response is "NO", the following Medicare IM given date fields will be blank) Date Medicare IM given:  08/20/2014 Medicare IM given by:  [facility] Date Additional Medicare IM given:   Additional Medicare IM given by:    Discharge Disposition:  HOME/SELF CARE  Per UR Regulation:  Reviewed for med. necessity/level of care/duration of stay  If discussed at [place] of Stay Meetings, dates discussed:    Comments:  08/20/14 Breylon Sherrow RN,BSN NCM [phone] NO D/C NEEDS OR ORDERS.

## 2014-09-06 ENCOUNTER — Encounter: Payer: Self-pay | Admitting: Internal Medicine

## 2014-09-06 ENCOUNTER — Other Ambulatory Visit (INDEPENDENT_AMBULATORY_CARE_PROVIDER_SITE_OTHER): Payer: Medicare Other

## 2014-09-06 ENCOUNTER — Ambulatory Visit (INDEPENDENT_AMBULATORY_CARE_PROVIDER_SITE_OTHER): Payer: Medicare Other | Admitting: Internal Medicine

## 2014-09-06 VITALS — BP 114/62 | HR 68 | Temp 98.4°F | Resp 14 | Wt 214.5 lb

## 2014-09-06 DIAGNOSIS — M545 Low back pain, unspecified: Secondary | ICD-10-CM

## 2014-09-06 DIAGNOSIS — K625 Hemorrhage of anus and rectum: Secondary | ICD-10-CM

## 2014-09-06 DIAGNOSIS — Z7901 Long term (current) use of anticoagulants: Secondary | ICD-10-CM

## 2014-09-06 DIAGNOSIS — I2699 Other pulmonary embolism without acute cor pulmonale: Secondary | ICD-10-CM

## 2014-09-06 LAB — CBC WITH DIFFERENTIAL/PLATELET
BASOS ABS: 0 10*3/uL (ref 0.0–0.1)
Basophils Relative: 0.4 % (ref 0.0–3.0)
EOS PCT: 2.1 % (ref 0.0–5.0)
Eosinophils Absolute: 0.1 10*3/uL (ref 0.0–0.7)
HCT: 39.2 % (ref 39.0–52.0)
Hemoglobin: 12.8 g/dL — ABNORMAL LOW (ref 13.0–17.0)
LYMPHS PCT: 31.4 % (ref 12.0–46.0)
Lymphs Abs: 2 10*3/uL (ref 0.7–4.0)
MCHC: 32.7 g/dL (ref 30.0–36.0)
MCV: 91.1 fl (ref 78.0–100.0)
Monocytes Absolute: 0.5 10*3/uL (ref 0.1–1.0)
Monocytes Relative: 7.6 % (ref 3.0–12.0)
NEUTROS PCT: 58.5 % (ref 43.0–77.0)
Neutro Abs: 3.7 10*3/uL (ref 1.4–7.7)
Platelets: 215 10*3/uL (ref 150.0–400.0)
RBC: 4.3 Mil/uL (ref 4.22–5.81)
RDW: 14.6 % (ref 11.5–15.5)
WBC: 6.4 10*3/uL (ref 4.0–10.5)

## 2014-09-06 MED ORDER — RIVAROXABAN 20 MG PO TABS: 20.0000 mg | ORAL_TABLET | Freq: Every day | ORAL | Status: DC

## 2014-09-06 MED ORDER — ALPRAZOLAM 0.5 MG PO TABS: 0.5000 mg | ORAL_TABLET | Freq: Three times a day (TID) | ORAL | Status: DC | PRN

## 2014-09-06 NOTE — Progress Notes (Signed)
Pre visit review using our clinic review tool, if applicable. No additional management support is needed unless otherwise documented below in the visit note. 

## 2014-09-06 NOTE — Patient Instructions (Addendum)
Your next office appointment will be determined based upon review of your pending labs . Those instructions will be transmitted to you  by mail Followup as needed for your acute issue. Please report any significant change in your symptoms. Use an anti-inflammatory cream such as Aspercreme or Zostrix cream twice a day to the affected area as needed. In lieu of this warm moist compresses or  hot water bottle can be used. Do not apply ice . Aprazolam ( Xanax) is among those which experts have documented to have a very  high risk of affecting  mental  alertness  & balance. This results in increased risk of falling with serious health or life threatening injury ESPECIALLY as you are on a blood thinner. Such medication should be taken as infrequently as possible and @  the lowest possible dose.It should not be taken with alcohol, sedatives  or other agents which have a similar  adverse risk potential. These risks are greater as we age as there is decreased ability of the liver and kidneys to metabolize and excrete the medication, resulting in  increased blood levels of the active ingredient.

## 2014-09-06 NOTE — Assessment & Plan Note (Signed)
CBC

## 2014-09-06 NOTE — Progress Notes (Signed)
   Subjective:    Patient ID: Louis [name], male    DOB: 1936-04-26, 78 y.o.   MRN: [id number]  HPI  The hospital records 11/17-11/20/15 were reviewed. He was found to have asymptomatic pulmonary thromboemboli in the right lower lobe on CT scan of the abdomen.  He had no active cardiopulmonary symptoms at that time.  The CAT scan was done because of upper abdominal pain and rectal bleeding.  He was admitted and transitioned from heparin to Xarelto, which is planned  for 3 months.  Follow-up CBC was recommended.  At this time he's having no active GI symptoms or any bleeding dyscrasias.  There is no personal or family history of pulmonary thromboemboli or coagulopathy. A coagulopathy profile was not done prior to initiation of anticoagulants.  09/04/14 he developed RUQ to R back after lifting 6 bags of topsoil weighing 40 #.Pain is worse in RLDP.No associated GU symptoms present.    Review of Systems   Chest pain, palpitations, tachycardia, exertional dyspnea, paroxysmal nocturnal dyspnea, claudication or edema are absent @ this time.  Epistaxis, hemoptysis, hematuria, melena, or rectal bleeding denied. No unexplained weight loss, significant dyspepsia,dysphagia, or abdominal pain.  There is no abnormal bruising , bleeding, or difficulty stopping bleeding with injury. Unexplained weight loss, abdominal pain, significant dyspepsia, dysphagia, melena, rectal bleeding, or persistently small caliber stools are denied since rectal bleeding 11/12.He was seen by GI 11/13;CAT scan was done 11/17.  Dysuria, pyuria, or hematuria, frequency, nocturia or polyuria are denied..      Objective:   Physical Exam General appearance is one of good health and nourishment w/o distress.  Pattern alopecia.  Eyes: No conjunctival inflammation or scleral icterus is present.  Oral exam: Upper & lower partials.Lips and gums are healthy appearing.There is no oropharyngeal erythema or exudate noted.    Heart:  Normal rate and regular rhythm. S1 and S2 normal without gallop, murmur, click, or rub .S4    Lungs:Chest clear to auscultation; no wheezes, rhonchi,rales ,or rubs present.No increased work of breathing.   Abdomen: bowel sounds normal, soft  without masses, or organomegaly .Minimal tenderness RUQ.Large ventral hernia noted.  No guarding or rebound . Tenderness over the R  flank to percussion  Musculoskeletal: Able to lie flat and sit up without help. Negative straight leg raising bilaterally. Gait normal.Isolated DIP DJD changes. Neuro: DTRs 0+ @ knees. Skin:Warm & dry.  Intact without suspicious lesions or rashes ; no jaundice .Slight tenting  Lymphatic: No lymphadenopathy is noted about the head, neck, axilla                Assessment & Plan:  #1 asymptomatic PTE #2 MS R  back pain post repetitive lifting. He has narcotic pain meds from Dr Nelva Bush #3 rectal bleeding as per Dr Henrene Pastor; CBC will be checked See orders Xanax risk discussed

## 2014-09-30 ENCOUNTER — Other Ambulatory Visit: Payer: Self-pay | Admitting: Internal Medicine

## 2014-09-30 NOTE — Telephone Encounter (Signed)
Alprazolam has been called to [facility] on [address] in [place]

## 2014-09-30 NOTE — Telephone Encounter (Signed)
OK  This medication is among those which experts have documented to have a very  high risk of affecting  mental  alertness  & balance. This results in increased risk of falling with serious health or life threatening injury. Such medication should be taken as infrequently as possible and @  the lowest possible dose

## 2014-10-13 DIAGNOSIS — Z79891 Long term (current) use of opiate analgesic: Secondary | ICD-10-CM | POA: Diagnosis not present

## 2014-10-13 DIAGNOSIS — G894 Chronic pain syndrome: Secondary | ICD-10-CM | POA: Diagnosis not present

## 2014-10-21 DIAGNOSIS — M25511 Pain in right shoulder: Secondary | ICD-10-CM | POA: Diagnosis not present

## 2014-10-28 DIAGNOSIS — M25511 Pain in right shoulder: Secondary | ICD-10-CM | POA: Diagnosis not present

## 2014-10-28 DIAGNOSIS — G894 Chronic pain syndrome: Secondary | ICD-10-CM | POA: Diagnosis not present

## 2014-10-28 DIAGNOSIS — M75121 Complete rotator cuff tear or rupture of right shoulder, not specified as traumatic: Secondary | ICD-10-CM | POA: Diagnosis not present

## 2014-10-28 DIAGNOSIS — Z79891 Long term (current) use of opiate analgesic: Secondary | ICD-10-CM | POA: Diagnosis not present

## 2014-11-01 ENCOUNTER — Telehealth: Payer: Self-pay | Admitting: Internal Medicine

## 2014-11-01 NOTE — Telephone Encounter (Signed)
Please schedule patient an office visit. Thanks

## 2014-11-01 NOTE — Telephone Encounter (Signed)
Pt called in and has some questions about his meds and how he should be taking it?

## 2014-11-01 NOTE — Telephone Encounter (Signed)
He needs OV this week before refilling Xarelto

## 2014-11-01 NOTE — Telephone Encounter (Signed)
Phone call to patient. He states he's been on Xarelto for 2 1/2 months. He has 5 tablets left. Is this something he should continue to take? Please advise.

## 2014-11-03 ENCOUNTER — Encounter: Payer: Self-pay | Admitting: Internal Medicine

## 2014-11-03 ENCOUNTER — Ambulatory Visit (INDEPENDENT_AMBULATORY_CARE_PROVIDER_SITE_OTHER): Payer: Medicare Other | Admitting: Internal Medicine

## 2014-11-03 VITALS — BP 138/68 | HR 81 | Temp 98.2°F | Ht 72.0 in | Wt 219.5 lb

## 2014-11-03 DIAGNOSIS — E1159 Type 2 diabetes mellitus with other circulatory complications: Secondary | ICD-10-CM

## 2014-11-03 DIAGNOSIS — E038 Other specified hypothyroidism: Secondary | ICD-10-CM | POA: Diagnosis not present

## 2014-11-03 DIAGNOSIS — I2699 Other pulmonary embolism without acute cor pulmonale: Secondary | ICD-10-CM | POA: Diagnosis not present

## 2014-11-03 NOTE — Progress Notes (Signed)
Pre visit review using our clinic review tool, if applicable. No additional management support is needed unless otherwise documented below in the visit note. 

## 2014-11-03 NOTE — Patient Instructions (Addendum)
Your next office appointment will be second week of March

## 2014-11-03 NOTE — Assessment & Plan Note (Signed)
A1c in early March

## 2014-11-03 NOTE — Progress Notes (Signed)
   Subjective:    Patient ID: Louis [name], male    DOB: 1935-11-29, 79 y.o.   MRN: [id number]  HPI  The unusual presentation of the right lower lobe pulmonary embolus was revisited. This was an incidental finding on CT imaging for chronic recurrent abdominal pain for which he sees Dr. Henrene Pastor.  There was no predisposition for the pulmonary embolus. He has no personal  & there is no family history of deep venous thrombosis, pulmonary embolus, or coagulopathy.  The plan was to continue the oral anticoagulant for 3 months. That would be completed as of 11/20/14. While on this agent; he was told hold his metformin which he has done.  BP @ home ranges 140/65-68. He denies any active cardiopulmonary symptoms at this time.  He continues to have some intermittent abdominal pain and plans to follow-up with his gastroenterologist.  Review of Systems  Chest pain, palpitations, tachycardia, exertional dyspnea, paroxysmal nocturnal dyspnea, claudication or edema are absent.      Objective:   Physical Exam  Appears healthy and well-nourished & in no acute distress  No carotid bruits are present.No neck vein distention present at 10 - 15 degrees. Thyroid normal to palpation  Heart rhythm and rate are normal with no gallop or murmur  Chest is clear with no increased work of breathing  There is no evidence of aortic aneurysm or renal artery bruits  Abdomen protuberant but soft with no organomegaly or masses.Ventral hernia present. No HJR  No clubbing, cyanosis or edema present.  Pedal pulses are intact   No ischemic skin changes are present . Fingernails healthy   Alert and oriented. Strength, tone, DTRs reflexes normal        Assessment & Plan:  See Current Assessment & Plan in Problem List under specific Diagnosis Xanax risks , especially in context of Xarelto therapy discussed.

## 2014-11-03 NOTE — Assessment & Plan Note (Addendum)
3 weeks of Xarelto samples  (Lot 15MG [id number]; Exp in 2018) F/U in early March for coagulopathy panel off Xarelto

## 2014-11-04 NOTE — Assessment & Plan Note (Signed)
TSH in 3/16

## 2014-11-08 ENCOUNTER — Telehealth: Payer: Self-pay | Admitting: Internal Medicine

## 2014-11-08 ENCOUNTER — Telehealth: Payer: Self-pay | Admitting: *Deleted

## 2014-11-08 NOTE — Telephone Encounter (Signed)
Extraction OK 72 hrs off Xarelto ( actually 24 hrs but let's be cautious)

## 2014-11-08 NOTE — Telephone Encounter (Signed)
[facility] - Client [facility] Patient Name: Louis [name] Gender: Male DOB: 01/17/1936 Age: 79 Y 23 D Return Phone Number: [phone] (Primary) Address: [address] City/State/Zip: [place] [address] Client [facility] - Client Client [facility] Physician Unice Cobble Contact Type Call Call Type Triage / Clinical Relationship To Patient Self Appointment Disposition EMR Appointment Not Necessary Return Phone Number [phone] (Primary) Chief Complaint Medication Question (non symptomatic) Initial Comment Caller states he is on a blood thinner his dentist wants to extract a tooth. When can he have that done PreDisposition Call Doctor Info pasted into Epic Yes Nurse Assessment Nurse: Rock Nephew, RN, Juliann Pulse Date/Time (Eastern Time): 11/08/2014 10:22:30 AM Confirm and document reason for call. If symptomatic, describe symptoms. ---Caller states that he will finish his course of Xarelto in the next 14 days ( blood clot in November ) . He is needing to have a nonemergent tooth extraction and needs to know how long he should wait after he stops taking the Xarelto? Has the patient traveled out of the country within the last 30 days? ---Not Applicable Does the patient require triage? ---No Please document clinical information provided and list any resource used. ---I called the office and spoke with [name] and she states she will forward a message to Dr. Linna Darner regarding patient's inquiry. I advised the caller of above information and he verbalized understanding. Guidelines Guideline Title Affirmed Question Affirmed Notes Nurse Date/Time Eilene Ghazi Time) Information Only Call [1] Caller requesting NON-URGENT health information AND [2] PCP's office is the best resource message forwarded to Dr. Linna Darner ( via [name] ) Rock Nephew, RN, Juliann Pulse 11/08/2014 10:32:50 AM Disp. Time  Eilene Ghazi Time) Disposition Final User 11/08/2014 10:33:43 AM Call PCP when Office is Open Yes Rock Nephew, RN, Juliann Pulse PLEASE NOTE: All timestamps contained within this report are represented as [place] Standard Time. CONFIDENTIALTY NOTICE: This fax transmission is intended only for the addressee. It contains information that is legally privileged, confidential or otherwise protected from use or disclosure. If you are not the intended recipient, you are strictly prohibited from reviewing, disclosing, copying using or disseminating any of this information or taking any action in reliance on or regarding this information. If you have received this fax in error, please notify us immediately by telephone so that we can arrange for its return to [place]. Phone: [phone], Toll-Free: [phone], Fax: [phone] Page: 2 of 2 Call Id: [id number] Numidia Understands: Yes Disagree/Comply: Comply Care Advice Given Per Guideline CALL PCP WHEN OFFICE IS OPEN: You need to discuss this with your doctor within the next few days. Call him/her during regular office hours. CARE ADVICE given per Information Only Call - No Triage (Adult) guideline.

## 2014-11-08 NOTE — Telephone Encounter (Signed)
Patient needs to make an appointment to have a tooth pulled and he want's to know how long he needs to be off of the XARELTO before he can have that done.

## 2014-11-08 NOTE — Telephone Encounter (Signed)
PLEASE NOTE: All timestamps contained within this report are represented as [place] Standard Time. CONFIDENTIALTY NOTICE: This fax transmission is intended only for the addressee. It contains information that is legally privileged, confidential or otherwise protected from use or disclosure. If you are not the intended recipient, you are strictly prohibited from reviewing, disclosing, copying using or disseminating any of this information or taking any action in reliance on or regarding this information. If you have received this fax in error, please notify us immediately by telephone so that we can arrange for its return to [place]. Phone: [phone], Toll-Free: [phone], Fax: [phone] Page: 1 of 1 Call Id: [id number] [facility] - [facility] Patient Name: Louis [name] DOB: [birth year] Initial Comment Caller states he is on a blood thinner his dentist wants to extract a tooth. When can he have that done Nurse Assessment Nurse: Rock Nephew, RN, Juliann Pulse Date/Time (Eastern Time): 11/08/2014 10:22:30 AM Confirm and document reason for call. If symptomatic, describe symptoms. ---Caller states that he will finish his course of Xarelto in the next 14 days ( blood clot in November ) . He is needing to have a nonemergent tooth extraction and needs to know how long he should wait after he stops taking the Xarelto? Has the patient traveled out of the country within the last 30 days? ---Not Applicable Does the patient require triage? ---No Please document clinical information provided and list any resource used. ---I called the office and spoke with [name] and she states she will forward a message to Dr. Linna Darner regarding patient's inquiry. I advised the caller of above information and he verbalized understanding. Guidelines Guideline Title Affirmed Question Affirmed Notes Information Only Call [1] Caller requesting NON-URGENT  health information AND [2] PCP's office is the best resource message forwarded to Dr. Linna Darner ( via [name] ) Final Disposition User Call PCP when Office is Open Rock Nephew, RN, Juliann Pulse

## 2014-11-08 NOTE — Telephone Encounter (Signed)
Patient's wife has been advised.

## 2014-11-09 ENCOUNTER — Other Ambulatory Visit: Payer: Self-pay | Admitting: Internal Medicine

## 2014-11-09 ENCOUNTER — Other Ambulatory Visit: Payer: Self-pay | Admitting: Physician Assistant

## 2014-11-09 NOTE — Telephone Encounter (Signed)
OK X1  This medication is among those which experts have documented to have a very  high risk of affecting  mental  alertness  & balance. This results in increased risk of falling with serious health or life threatening injury. Such medication should be taken as infrequently as possible and @  the lowest possible dose.It should not be taken with alcohol, sedatives  or other agents which have a similar  adverse risk potential.  Being on a blood thinner increases risk greatly

## 2014-11-09 NOTE — Telephone Encounter (Signed)
Alprazolam has been called to [facility]

## 2014-12-08 ENCOUNTER — Ambulatory Visit (INDEPENDENT_AMBULATORY_CARE_PROVIDER_SITE_OTHER): Payer: Medicare Other | Admitting: Internal Medicine

## 2014-12-08 ENCOUNTER — Encounter: Payer: Self-pay | Admitting: Internal Medicine

## 2014-12-08 ENCOUNTER — Other Ambulatory Visit (INDEPENDENT_AMBULATORY_CARE_PROVIDER_SITE_OTHER): Payer: Medicare Other

## 2014-12-08 VITALS — BP 122/70 | HR 87 | Temp 98.1°F | Ht 72.0 in | Wt 217.0 lb

## 2014-12-08 DIAGNOSIS — E1159 Type 2 diabetes mellitus with other circulatory complications: Secondary | ICD-10-CM | POA: Diagnosis not present

## 2014-12-08 DIAGNOSIS — E038 Other specified hypothyroidism: Secondary | ICD-10-CM

## 2014-12-08 DIAGNOSIS — M48061 Spinal stenosis, lumbar region without neurogenic claudication: Secondary | ICD-10-CM

## 2014-12-08 DIAGNOSIS — Z86711 Personal history of pulmonary embolism: Secondary | ICD-10-CM | POA: Diagnosis not present

## 2014-12-08 DIAGNOSIS — M4806 Spinal stenosis, lumbar region: Secondary | ICD-10-CM

## 2014-12-08 LAB — BASIC METABOLIC PANEL
BUN: 22 mg/dL (ref 6–23)
CO2: 27 meq/L (ref 19–32)
Calcium: 9.7 mg/dL (ref 8.4–10.5)
Chloride: 104 mEq/L (ref 96–112)
Creatinine, Ser: 1.21 mg/dL (ref 0.40–1.50)
GFR: 61.78 mL/min (ref >60.00)
GLUCOSE: 137 mg/dL — AB (ref 70–99)
Potassium: 4.1 mEq/L (ref 3.5–5.1)
Sodium: 139 mEq/L (ref 135–145)

## 2014-12-08 LAB — MICROALBUMIN / CREATININE URINE RATIO
Creatinine,U: 207 mg/dL
MICROALB UR: 3 mg/dL — AB (ref 0.0–1.9)
MICROALB/CREAT RATIO: 1.4 mg/g (ref 0.0–30.0)

## 2014-12-08 LAB — HEMOGLOBIN A1C: HEMOGLOBIN A1C: 7 % — AB (ref 4.6–6.5)

## 2014-12-08 LAB — TSH: TSH: 0.39 u[IU]/mL (ref 0.35–4.50)

## 2014-12-08 NOTE — Progress Notes (Signed)
   Subjective:    Patient ID: Louis [name], male    DOB: October 19, 1935, 79 y.o.   MRN: [id number]  HPI He has been off the novel anticoagulant for 2-3 weeks. He has no cardio pulmonary symptoms. He has returned for hypercoagulability assessment in view of his pulmonary embolus without associated deep venous thrombosis or other trigger/injury.  He's also been off metformin while he was on the anticoagulant. Fasting blood sugars are 105-125. He has no hypoglycemia or other symptoms related to uncontrolled diabetes.  He is questioning receiving the Shingles vaccine but he is on Methotrexate.  Risks as per Beer's data in relationship to Xanax again discussed;to be taken  prn @ lowest possible doses stressed.  Review of Systems   Chest pain, palpitations, tachycardia, exertional dyspnea, paroxysmal nocturnal dyspnea, claudication or edema are absent.  Polyuria, polyphagia, polydipsia absent.  There is no blurred vision, double vision, or loss of vision.   No postural dizziness noted. Denied are numbness, tingling, or burning of the extremities.  No nonhealing skin lesions present.  Weight is stable.      Objective:   Physical Exam Pertinent positive findings include: He has dullness to percussion the right upper quadrant. Ventral hernia is noted.  General appearance :adequately nourished; in no distress.Central weight excess. Eyes: No conjunctival inflammation or scleral icterus is present. Oral exam:  Lips and gums are healthy appearing.There is no oropharyngeal erythema or exudate noted. Dental hygiene is good. Heart:  Normal rate and regular rhythm. S1 and S2 normal without gallop, murmur, click, rub or other extra sounds   Lungs:Chest clear to auscultation; no wheezes, rhonchi,rales ,or rubs present.No increased work of breathing.  Abdomen: bowel sounds normal, soft and non-tender without masses, organomegaly or hernias noted.  No guarding or rebound.  Vascular : all pulses equal ;  no bruits present. Skin:Warm & dry.  Intact without suspicious lesions or rashes ; no tenting or jaundice  Lymphatic: No lymphadenopathy is noted about the head, neck, axilla Neuro: Strength, tone & DTRs normal.        Assessment & Plan:  See Current Assessment & Plan in Problem List under specific Diagnosis Shingles contraindicated due to MTX; verification with Dr Amil Amen

## 2014-12-08 NOTE — Patient Instructions (Addendum)
Please verify whether Shingles shot is contraindicated while on Methotrexate with your Rheumatologist.  Your next office appointment will be determined based upon review of your pending labs   Those instructions will be transmitted to you by mail.   Critical values will be called. Followup as needed for any active or acute issue. Please report any significant change in your symptoms.

## 2014-12-08 NOTE — Progress Notes (Signed)
Pre visit review using our clinic review tool, if applicable. No additional management support is needed unless otherwise documented below in the visit note. 

## 2014-12-10 ENCOUNTER — Telehealth: Payer: Self-pay | Admitting: *Deleted

## 2014-12-10 NOTE — Telephone Encounter (Signed)
-----   Message from Hendricks Limes, MD sent at 12/10/2014  9:39 AM EST ----- Please FAX office visit & lab results to Dr Amil Amen , Rheumatologist

## 2014-12-10 NOTE — Assessment & Plan Note (Signed)
A1c , urine microalbumin, BMET 

## 2014-12-10 NOTE — Assessment & Plan Note (Signed)
TSH 

## 2014-12-10 NOTE — Telephone Encounter (Signed)
Faxed OV & labs to Dr. Amil Amen per md request.../lmb

## 2014-12-10 NOTE — Assessment & Plan Note (Signed)
Hypercoag panel

## 2014-12-12 LAB — HYPERCOAGULABLE PANEL, COMPREHENSIVE
ANTICARDIOLIPIN IGG: 2 GPL U/mL (ref <23)
ANTICARDIOLIPIN IGM: 0 [MPL'U]/mL (ref <11)
ANTITHROMB III FUNC: 87 % (ref 76–126)
Anticardiolipin IgA: 6 APL U/mL (ref <22)
BETA 2 GLYCO I IGG: 12 G Units (ref <20)
BETA-2-GLYCOPROTEIN I IGA: 5 A Units (ref <20)
Beta-2-Glycoprotein I IgM: 7 M Units (ref <20)
DRVVT: 33.9 s (ref <42.9)
Lupus Anticoagulant: NOT DETECTED
PROTEIN C ACTIVITY: 171 % — AB (ref 75–133)
PROTEIN C, TOTAL: 104 % (ref 72–160)
PROTEIN S TOTAL: 91 % (ref 60–150)
PTT Lupus Anticoagulant: 36.2 secs (ref 28.0–43.0)
Protein S Activity: 148 % — ABNORMAL HIGH (ref 69–129)

## 2014-12-24 ENCOUNTER — Other Ambulatory Visit: Payer: Self-pay | Admitting: Internal Medicine

## 2014-12-30 DIAGNOSIS — M0609 Rheumatoid arthritis without rheumatoid factor, multiple sites: Secondary | ICD-10-CM | POA: Diagnosis not present

## 2014-12-30 DIAGNOSIS — M8580 Other specified disorders of bone density and structure, unspecified site: Secondary | ICD-10-CM | POA: Diagnosis not present

## 2014-12-30 DIAGNOSIS — M79643 Pain in unspecified hand: Secondary | ICD-10-CM | POA: Diagnosis not present

## 2014-12-30 DIAGNOSIS — M150 Primary generalized (osteo)arthritis: Secondary | ICD-10-CM | POA: Diagnosis not present

## 2015-01-07 ENCOUNTER — Other Ambulatory Visit: Payer: Self-pay | Admitting: Internal Medicine

## 2015-01-07 NOTE — Telephone Encounter (Signed)
OK x1  PRN only

## 2015-01-07 NOTE — Telephone Encounter (Signed)
Alprazolam has been called to [facility]

## 2015-02-22 ENCOUNTER — Other Ambulatory Visit: Payer: Self-pay

## 2015-02-22 MED ORDER — LEVOTHYROXINE SODIUM 125 MCG PO TABS: 125.0000 ug | ORAL_TABLET | Freq: Every day | ORAL | Status: DC

## 2015-02-23 ENCOUNTER — Other Ambulatory Visit: Payer: Self-pay | Admitting: Internal Medicine

## 2015-02-23 NOTE — Telephone Encounter (Signed)
Call refill into [facility] had to leave on pharmacy vm...Louis [name]

## 2015-02-23 NOTE — Telephone Encounter (Signed)
Please advise, thanks.

## 2015-02-23 NOTE — Telephone Encounter (Signed)
OK X1 

## 2015-03-04 DIAGNOSIS — M5136 Other intervertebral disc degeneration, lumbar region: Secondary | ICD-10-CM | POA: Diagnosis not present

## 2015-03-04 DIAGNOSIS — M47816 Spondylosis without myelopathy or radiculopathy, lumbar region: Secondary | ICD-10-CM | POA: Diagnosis not present

## 2015-03-09 DIAGNOSIS — Z79891 Long term (current) use of opiate analgesic: Secondary | ICD-10-CM | POA: Diagnosis not present

## 2015-03-09 DIAGNOSIS — G894 Chronic pain syndrome: Secondary | ICD-10-CM | POA: Diagnosis not present

## 2015-03-09 DIAGNOSIS — M5136 Other intervertebral disc degeneration, lumbar region: Secondary | ICD-10-CM | POA: Diagnosis not present

## 2015-03-09 DIAGNOSIS — M47816 Spondylosis without myelopathy or radiculopathy, lumbar region: Secondary | ICD-10-CM | POA: Diagnosis not present

## 2015-03-24 ENCOUNTER — Encounter: Payer: Self-pay | Admitting: Internal Medicine

## 2015-03-28 ENCOUNTER — Other Ambulatory Visit: Payer: Self-pay | Admitting: Internal Medicine

## 2015-05-24 DIAGNOSIS — M25512 Pain in left shoulder: Secondary | ICD-10-CM | POA: Diagnosis not present

## 2015-05-24 DIAGNOSIS — Z96612 Presence of left artificial shoulder joint: Secondary | ICD-10-CM | POA: Diagnosis not present

## 2015-05-27 ENCOUNTER — Other Ambulatory Visit: Payer: Self-pay | Admitting: Internal Medicine

## 2015-05-27 NOTE — Telephone Encounter (Signed)
OK X1  My retirement date is 10/01/2015; but I will be in office on a limited schedule Oct-Dec. To guarantee continuity of care you should transition your care to another PCP by Oct 1,2016.     

## 2015-07-01 DIAGNOSIS — M150 Primary generalized (osteo)arthritis: Secondary | ICD-10-CM | POA: Diagnosis not present

## 2015-07-01 DIAGNOSIS — M810 Age-related osteoporosis without current pathological fracture: Secondary | ICD-10-CM | POA: Diagnosis not present

## 2015-07-01 DIAGNOSIS — M0609 Rheumatoid arthritis without rheumatoid factor, multiple sites: Secondary | ICD-10-CM | POA: Diagnosis not present

## 2015-07-04 ENCOUNTER — Other Ambulatory Visit: Payer: Self-pay | Admitting: Internal Medicine

## 2015-07-05 ENCOUNTER — Telehealth: Payer: Self-pay

## 2015-07-05 NOTE — Telephone Encounter (Signed)
Xanax faxed to [facility]---patient needs to schedule office visit before any further refills

## 2015-08-02 DIAGNOSIS — Z79891 Long term (current) use of opiate analgesic: Secondary | ICD-10-CM | POA: Diagnosis not present

## 2015-08-02 DIAGNOSIS — M47816 Spondylosis without myelopathy or radiculopathy, lumbar region: Secondary | ICD-10-CM | POA: Diagnosis not present

## 2015-08-02 DIAGNOSIS — G894 Chronic pain syndrome: Secondary | ICD-10-CM | POA: Diagnosis not present

## 2015-08-02 DIAGNOSIS — M5136 Other intervertebral disc degeneration, lumbar region: Secondary | ICD-10-CM | POA: Diagnosis not present

## 2015-08-08 ENCOUNTER — Other Ambulatory Visit: Payer: Self-pay | Admitting: Physician Assistant

## 2015-08-08 NOTE — Telephone Encounter (Signed)
Pt was last seen on 08-13-2014 .There is no current follow up appointment. Pt is requesting a refill on Bentyl 10mg  TID. Please advise.

## 2015-08-08 NOTE — Telephone Encounter (Signed)
Ok  With 3 refills

## 2015-08-09 ENCOUNTER — Telehealth: Payer: Self-pay | Admitting: Internal Medicine

## 2015-08-09 NOTE — Telephone Encounter (Signed)
Should this be coming from GI?

## 2015-08-09 NOTE — Telephone Encounter (Signed)
[facility] requesting a fill of ondansetron (ZOFRAN) 4 MG tablet [[id number]]

## 2015-08-10 ENCOUNTER — Telehealth: Payer: Self-pay | Admitting: Physician Assistant

## 2015-08-10 MED ORDER — ONDANSETRON HCL 4 MG PO TABS: 4.0000 mg | ORAL_TABLET | Freq: Four times a day (QID) | ORAL | Status: DC | PRN

## 2015-08-10 NOTE — Telephone Encounter (Signed)
Patient informed. 

## 2015-08-10 NOTE — Telephone Encounter (Signed)
LVM for pt to call back, the RX should be coming fronm Dr Henrene Pastor

## 2015-08-10 NOTE — Telephone Encounter (Signed)
Sent # [phone] with no refills per Amy Esterwood PA-C. Further refills patient will need an appointment. Dr. Henrene Pastor patient. [place].

## 2015-08-10 NOTE — Telephone Encounter (Signed)
I would not Rx this as maintenance;it came from Dr Henrene Pastor

## 2015-09-02 DIAGNOSIS — H906 Mixed conductive and sensorineural hearing loss, bilateral: Secondary | ICD-10-CM | POA: Diagnosis not present

## 2015-09-05 DIAGNOSIS — H35372 Puckering of macula, left eye: Secondary | ICD-10-CM | POA: Diagnosis not present

## 2015-09-06 ENCOUNTER — Other Ambulatory Visit: Payer: Self-pay | Admitting: Internal Medicine

## 2015-09-09 ENCOUNTER — Encounter: Payer: Self-pay | Admitting: Internal Medicine

## 2015-09-09 ENCOUNTER — Other Ambulatory Visit (INDEPENDENT_AMBULATORY_CARE_PROVIDER_SITE_OTHER): Payer: Medicare Other

## 2015-09-09 ENCOUNTER — Ambulatory Visit (INDEPENDENT_AMBULATORY_CARE_PROVIDER_SITE_OTHER): Payer: Medicare Other | Admitting: Internal Medicine

## 2015-09-09 VITALS — BP 112/68 | HR 69 | Temp 98.3°F | Ht 72.0 in | Wt 219.0 lb

## 2015-09-09 DIAGNOSIS — F411 Generalized anxiety disorder: Secondary | ICD-10-CM | POA: Diagnosis not present

## 2015-09-09 DIAGNOSIS — E1159 Type 2 diabetes mellitus with other circulatory complications: Secondary | ICD-10-CM

## 2015-09-09 DIAGNOSIS — E039 Hypothyroidism, unspecified: Secondary | ICD-10-CM | POA: Diagnosis not present

## 2015-09-09 LAB — BASIC METABOLIC PANEL
BUN: 18 mg/dL (ref 6–23)
CHLORIDE: 103 meq/L (ref 96–112)
CO2: 30 meq/L (ref 19–32)
CREATININE: 1.23 mg/dL (ref 0.40–1.50)
Calcium: 9.2 mg/dL (ref 8.4–10.5)
GFR: 60.5 mL/min (ref >60.00)
Glucose, Bld: 111 mg/dL — ABNORMAL HIGH (ref 70–99)
Potassium: 4.6 mEq/L (ref 3.5–5.1)
Sodium: 141 mEq/L (ref 135–145)

## 2015-09-09 LAB — TSH: TSH: 1.43 u[IU]/mL (ref 0.35–4.50)

## 2015-09-09 LAB — HEMOGLOBIN A1C: HEMOGLOBIN A1C: 7 % — AB (ref 4.6–6.5)

## 2015-09-09 MED ORDER — ALPRAZOLAM 0.5 MG PO TABS: 0.5000 mg | ORAL_TABLET | Freq: Three times a day (TID) | ORAL | Status: DC | PRN

## 2015-09-09 NOTE — Assessment & Plan Note (Signed)
Given copy of Beers' precautions & recommendations .

## 2015-09-09 NOTE — Progress Notes (Signed)
   Subjective:    Patient ID: Louis [name], male    DOB: [birth year], 79 y.o.   MRN: [id number]  HPI The patient is here to assess status of active health conditions.  He is requesting a refill of his Xanax. He takes this when he gets anxious; this is an issue at times @ holidays particularly at the anniversary of the loss of their son. He may take it up to 2-3 times per day intermittently but then may not take it for 2 days @ a time. He denies any falls.  PMH, FH, & Social History reviewed & updated.No change in [place] as recorded.  Ophthalmologic exam is up-to-date. No retinopathy. Fasting blood sugars are 105-115. He denies any hypoglycemia.  Review of Systems He denies frank depression, anorexia, insomnia, or difficulty with triage. Chest pain, palpitations, tachycardia, exertional dyspnea, paroxysmal nocturnal dyspnea, claudication or edema are absent. No unexplained weight loss, abdominal pain, significant dyspepsia, dysphagia, melena, rectal bleeding, or persistently small caliber stools. Dysuria, pyuria, hematuria, frequency, nocturia or polyuria are denied. Change in hair, skin, nails denied. No bowel changes of constipation or diarrhea. No intolerance to heat or cold. No polydipsia or polyphagia reported. He has no numbness, tingling, burning in the feet. He has no nonhealing skin lesions.     Objective:   Physical Exam Pertinent or positive findings include: Pattern alopecia is present. He has bilateral hearing aids. Upper and lower partials are present. Knees are fusiformly enlarged. Deep tendon reflexes are 0+ at the knees.  General appearance :adequately nourished; in no distress.  Eyes: No conjunctival inflammation or scleral icterus is present.  Oral exam:  Lips and gums are healthy appearing.There is no oropharyngeal erythema or exudate noted. Dental hygiene is good.  Heart:  Normal rate and regular rhythm. S1 and S2 normal without gallop, murmur, click, rub or other  extra sounds    Lungs:Chest clear to auscultation; no wheezes, rhonchi,rales ,or rubs present.No increased work of breathing.   Abdomen: bowel sounds normal, soft and non-tender without masses, organomegaly or hernias noted.  No guarding or rebound. No flank tenderness to percussion.  Vascular : all pulses equal ; no bruits present.  Skin:Warm & dry.  Intact without suspicious lesions or rashes ; no tenting or jaundice   Lymphatic: No lymphadenopathy is noted about the head, neck, axilla, or inguinal areas.   Neuro: Strength, tone  normal.     Assessment & Plan:  See Current Assessment & Plan in Problem List under specific Diagnosis

## 2015-09-09 NOTE — Assessment & Plan Note (Addendum)
A1c BMET 

## 2015-09-09 NOTE — Assessment & Plan Note (Signed)
TSH 

## 2015-09-09 NOTE — Progress Notes (Signed)
Pre visit review using our clinic review tool, if applicable. No additional management support is needed unless otherwise documented below in the visit note. 

## 2015-09-09 NOTE — Patient Instructions (Signed)
Xanax is among those medications which experts have documented to have a very  high risk of affecting  mental  alertness  & balance. This results in increased risk of falling with serious health or life threatening injury. Such medication should be taken as infrequently as possible and @  the lowest possible dose.It should not be taken with alcohol, sedatives  or other agents which have a similar  adverse risk potential. These risks are greater as we age as there is decreased ability of the liver and kidneys to metabolize and excrete the medication, resulting in   increased blood levels of the active ingredient.   Your next office appointment will be determined based upon review of your pending labs. Those written interpretation of the lab results and instructions will be transmitted to you by mail for your records.  Critical results will be called.   Followup as needed for any active or acute issue. Please report any significant change in your symptoms.

## 2015-09-29 ENCOUNTER — Telehealth: Payer: Self-pay | Admitting: Internal Medicine

## 2015-09-29 ENCOUNTER — Other Ambulatory Visit: Payer: Self-pay | Admitting: Physician Assistant

## 2015-09-29 ENCOUNTER — Other Ambulatory Visit: Payer: Self-pay | Admitting: Emergency Medicine

## 2015-09-29 MED ORDER — ONDANSETRON HCL 4 MG PO TABS: 4.0000 mg | ORAL_TABLET | Freq: Four times a day (QID) | ORAL | Status: DC | PRN

## 2015-09-30 DIAGNOSIS — Z23 Encounter for immunization: Secondary | ICD-10-CM | POA: Diagnosis not present

## 2015-10-04 MED ORDER — ONDANSETRON HCL 4 MG PO TABS: 4.0000 mg | ORAL_TABLET | Freq: Four times a day (QID) | ORAL | Status: DC | PRN

## 2015-10-04 NOTE — Telephone Encounter (Signed)
Refilled Zofran.

## 2015-10-18 ENCOUNTER — Ambulatory Visit (INDEPENDENT_AMBULATORY_CARE_PROVIDER_SITE_OTHER): Payer: Medicare Other | Admitting: Internal Medicine

## 2015-10-18 ENCOUNTER — Encounter: Payer: Self-pay | Admitting: Internal Medicine

## 2015-10-18 VITALS — BP 128/66 | HR 72 | Ht 70.28 in | Wt 223.5 lb

## 2015-10-18 DIAGNOSIS — R1084 Generalized abdominal pain: Secondary | ICD-10-CM | POA: Diagnosis not present

## 2015-10-18 DIAGNOSIS — K219 Gastro-esophageal reflux disease without esophagitis: Secondary | ICD-10-CM

## 2015-10-18 DIAGNOSIS — K5909 Other constipation: Secondary | ICD-10-CM

## 2015-10-18 DIAGNOSIS — R140 Abdominal distension (gaseous): Secondary | ICD-10-CM | POA: Diagnosis not present

## 2015-10-18 DIAGNOSIS — R112 Nausea with vomiting, unspecified: Secondary | ICD-10-CM

## 2015-10-18 DIAGNOSIS — Z86010 Personal history of colonic polyps: Secondary | ICD-10-CM | POA: Diagnosis not present

## 2015-10-18 MED ORDER — NA SULFATE-K SULFATE-MG SULF 17.5-3.13-1.6 GM/177ML PO SOLN: 1.0000 | Freq: Once | ORAL | Status: DC

## 2015-10-18 MED ORDER — ONDANSETRON HCL 4 MG PO TABS: 4.0000 mg | ORAL_TABLET | Freq: Four times a day (QID) | ORAL | Status: DC | PRN

## 2015-10-18 NOTE — Patient Instructions (Signed)
We have sent the following medications to [facility]:  Zofran  You have been scheduled for a colonoscopy. Please follow written instructions given to you at your visit today.  Please pick up your prep supplies at the pharmacy within the next 1-3 days. If you use inhalers (even only as needed), please bring them with you on the day of your procedure.

## 2015-10-18 NOTE — Progress Notes (Signed)
HISTORY OF PRESENT ILLNESS:  Louis [name] is a 80 y.o. male with a history of diabetes mellitus, hypertension, hyperlipidemia, remote small bowel obstruction secondary to adhesions, GERD, ischemic colitis, adenomatous colon polyps, and chronic abdominal pain. The patient was last evaluated in the office by the GI physician assistant November 2015 regarding an episode of severe cramping abdominal pain. CT angiogram of the abdomen and pelvis was obtained. No evidence for mesenteric occlusive disease or other cause for abdominal pain. However, he was noted to have a pulmonary embolus for which she was hospitalized and anticoagulated. He completed a course of anticoagulation. He presents today for follow-up. He is accompanied by his wife. He has not had recurrent severe episodes of abdominal pain but has to have chronic vague right-sided abdominal discomfort without nausea or vomiting. He has had problems with constipation for which she currently takes a half a dose of MiraLAX every other day with good results. He is concerned that episodes of abdominal pain may result in small bowel obstruction necessitating hospitalization. His last complete colonoscopy was performed January 2012. He was found to have adenomatous colon polyps for which follow-up in 5 years (pediatric scope and propofol) was recommended.. Patient has other complaints today including increased intestinal gas as manifested by bloating, belching, and flatus. His has been worse over the past year. He also reports chronic unpredictable nausea, particularly in the morning. No associated vomiting. This is relieved with Zofran. He does request a prescription for Zofran. GI review of systems otherwise negative.  REVIEW OF SYSTEMS:  All non-GI ROS negative except for anxiety, arthritis, back pain, hearing problems  Past Medical History  Diagnosis Date  . Anemia   . Diabetes mellitus   . Hypertension   . Hyperlipidemia   . Diverticulosis   .  Testosterone deficiency     111 in 2007  . Reflux esophagitis   . Spinal stenosis     Dr.Ramos  . Hemochromatosis      Dr Henrene Pastor  . Arthritis     Dr Lurline Hare, MTX rx  . Hypothyroidism   . SBO (small bowel obstruction) ([facility]) 2004  . Kidney stones, calcium oxalate     1985 & 2005  . Hx of adenomatous colonic polyps   . Internal hemorrhoids   . IBS (irritable bowel syndrome)     Past Surgical History  Procedure Laterality Date  . Appendectomy    . Shoulder surgery       2 on L ; 3 on R  . Colonoscopy w/ polypectomy  2005    tubular adenoma  . Lumbar nerve block       X 14; Dr Nelva Bush  . Total shoulder replacement      L  . Total knee arthroplasty   201 & 2012     bilaterally;Dr Alusio  . Refractive surgery  2005    [facility]  . Tonsillectomy and adenoidectomy    . Cataract  2005    bilaterally  . Tm replacement  1981  . Middle ear surgery  1980    Teflon tack   . L4-5 & l5- s1 bilat facet injections  01/20/14     Dr Nelva Bush    Social History [name]  reports that he quit smoking about 35 years ago. He has never used smokeless tobacco. He reports that he drinks alcohol. He reports that he does not use illicit drugs.  family history includes Breast cancer in his maternal grandmother; Depression in his  brother; Diabetes in his father and maternal aunt; Heart disease in his paternal aunt and paternal uncle; Heart failure in his mother; Stroke (age of onset: 73) in his father. There is no history of Colon cancer, COPD, or Asthma.  Allergies  Allergen Reactions  . Shellfish Allergy     ANGIOEDEMA       PHYSICAL EXAMINATION: Vital signs: BP 128/66 mmHg  Pulse 72  Ht 5' 10.28" (1.785 m)  Wt 223 lb 8 oz (101.379 kg)  BMI 31.82 kg/m2  Constitutional: generally well-appearing, no acute distress Psychiatric: alert and oriented x3, cooperative Eyes: extraocular movements intact, anicteric, conjunctiva pink Mouth: oral pharynx moist, no lesions Neck: supple no  lymphadenopathy Cardiovascular: heart regular rate and rhythm, no murmur Lungs: clear to auscultation bilaterally Abdomen: soft, nontender, nondistended, no obvious ascites, no peritoneal signs, normal bowel sounds, no organomegaly. Umbilical hernia Rectal: Deferred until colonoscopy Extremities: no clubbing cyanosis or lower extremity edema bilaterally Skin: no lesions on visible extremities Neuro: No focal deficits. Normal deep tendon reflexes. No asterixis.    ASSESSMENT:  #1. Chronic abdominal pain. Multiple extensive workups in past. Suspect functional and related to constipation. More severe episodes of pain may be related to adhesive disease without obstruction #2. Chronic constipation. MiraLAX is helping #3. Chronic GERD. On PPI #4. Chronic nausea. Request Zofran as this helps #5. History of adenomatous colon polyps due for surveillance. #6. New problems with gas and bloating. No alarm features #7. History of small bowel obstruction secondary to adhesions #8. Multiple medical problems including diabetes mellitus and a history of pulmonary embolus   PLAN:  #1. Continue MiraLAX #2. Discussion on increased intestinal gas. Daily probiotic for 2 weeks recommended #3. Literature on increased intestinal gas and anti-gas and flatulence dietary brochure provided for his review #4. Refill Zofran for nausea #5. Continue PPI to control GERD symptoms #6. Schedule surveillance colonoscopy. The patient is higher than baseline risk given his colonic anatomy, age and comorbidities.The nature of the procedure, as well as the risks, benefits, and alternatives were carefully and thoroughly reviewed with the patient. Ample time for discussion and questions allowed. The patient understood, was satisfied, and agreed to proceed. PEDIATRIC COLONOSCOPE #7. Hold diabetic medications the day of the procedure to avoid and wanted hypoglycemia #8. Should he develop severe episodes of pain that is  unrelenting and/or associated with vomiting, presented to the emergency room to rule out obstruction. Thankfully has not had this for some time  40 minutes was spent face-to-face with the patient. Greater than 50% the time use for counseling regarding his multiple GI issues and answering patient and wife questions

## 2015-10-27 ENCOUNTER — Encounter: Payer: Self-pay | Admitting: Internal Medicine

## 2015-10-27 ENCOUNTER — Ambulatory Visit (AMBULATORY_SURGERY_CENTER): Payer: Medicare Other | Admitting: Internal Medicine

## 2015-10-27 VITALS — BP 119/67 | HR 64 | Temp 98.9°F | Resp 58 | Ht 70.0 in | Wt 223.0 lb

## 2015-10-27 DIAGNOSIS — D123 Benign neoplasm of transverse colon: Secondary | ICD-10-CM | POA: Diagnosis not present

## 2015-10-27 DIAGNOSIS — D120 Benign neoplasm of cecum: Secondary | ICD-10-CM

## 2015-10-27 DIAGNOSIS — K625 Hemorrhage of anus and rectum: Secondary | ICD-10-CM

## 2015-10-27 DIAGNOSIS — E119 Type 2 diabetes mellitus without complications: Secondary | ICD-10-CM | POA: Diagnosis not present

## 2015-10-27 DIAGNOSIS — D122 Benign neoplasm of ascending colon: Secondary | ICD-10-CM | POA: Diagnosis not present

## 2015-10-27 DIAGNOSIS — Z86010 Personal history of colonic polyps: Secondary | ICD-10-CM | POA: Diagnosis not present

## 2015-10-27 DIAGNOSIS — D125 Benign neoplasm of sigmoid colon: Secondary | ICD-10-CM

## 2015-10-27 DIAGNOSIS — R109 Unspecified abdominal pain: Secondary | ICD-10-CM | POA: Diagnosis not present

## 2015-10-27 LAB — GLUCOSE, CAPILLARY
Glucose-Capillary: 110 mg/dL — ABNORMAL HIGH (ref 65–99)
Glucose-Capillary: 106 mg/dL — ABNORMAL HIGH (ref 65–99)

## 2015-10-27 MED ORDER — SODIUM CHLORIDE 0.9 % IV SOLN: 500.0000 mL | INTRAVENOUS | Status: DC

## 2015-10-27 NOTE — Patient Instructions (Signed)
YOU HAD AN ENDOSCOPIC PROCEDURE TODAY AT [facility]:   Refer to the procedure report that was given to you for any specific questions about what was found during the examination.  If the procedure report does not answer your questions, please call your gastroenterologist to clarify.  If you requested that your care partner not be given the details of your procedure findings, then the procedure report has been included in a sealed envelope for you to review at your convenience later.  YOU SHOULD EXPECT: Some feelings of bloating in the abdomen. Passage of more gas than usual.  Walking can help get rid of the air that was put into your GI tract during the procedure and reduce the bloating. If you had a lower endoscopy (such as a colonoscopy or flexible sigmoidoscopy) you may notice spotting of blood in your stool or on the toilet paper. If you underwent a bowel prep for your procedure, you may not have a normal bowel movement for a few days.  Please Note:  You might notice some irritation and congestion in your nose or some drainage.  This is from the oxygen used during your procedure.  There is no need for concern and it should clear up in a day or so.  SYMPTOMS TO REPORT IMMEDIATELY:   Following lower endoscopy (colonoscopy or flexible sigmoidoscopy):  Excessive amounts of blood in the stool  Significant tenderness or worsening of abdominal pains  Swelling of the abdomen that is new, acute  Fever of 100F or higher   For urgent or emergent issues, a gastroenterologist can be reached at any hour by calling [phone].   DIET: Your first meal following the procedure should be a small meal and then it is ok to progress to your normal diet. Heavy or fried foods are harder to digest and may make you feel nauseous or bloated.  Likewise, meals heavy in dairy and vegetables can increase bloating.  Drink plenty of fluids but you should avoid alcoholic beverages for 24  hours.  ACTIVITY:  You should plan to take it easy for the rest of today and you should NOT DRIVE or use heavy machinery until tomorrow (because of the sedation medicines used during the test).    FOLLOW UP: Our staff will call the number listed on your records the next business day following your procedure to check on you and address any questions or concerns that you may have regarding the information given to you following your procedure. If we do not reach you, we will leave a message.  However, if you are feeling well and you are not experiencing any problems, there is no need to return our call.  We will assume that you have returned to your regular daily activities without incident.  If any biopsies were taken you will be contacted by phone or by letter within the next 1-3 weeks.  Please call us at [phone] if you have not heard about the biopsies in 3 weeks.    SIGNATURES/CONFIDENTIALITY: You and/or your care partner have signed paperwork which will be entered into your electronic medical record.  These signatures attest to the fact that that the information above on your After Visit Summary has been reviewed and is understood.  Full responsibility of the confidentiality of this discharge information lies with you and/or your care-partner.  Polyp, diverticulosis information given,  Continue Miralax to keep bowels regular.

## 2015-10-27 NOTE — Progress Notes (Signed)
Called to room to assist during endoscopic procedure.  Patient ID and intended procedure confirmed with present staff. Received instructions for my participation in the procedure from the performing physician.  

## 2015-10-27 NOTE — Op Note (Signed)
[facility]  [address] [place], [id number]   COLONOSCOPY PROCEDURE REPORT  PATIENT: Louis [name], Louis [name]  MR#: [id number] BIRTHDATE: 1936/03/22 , 5  yrs. old GENDER: male ENDOSCOPIST: Eustace Quail, MD REFERRED [id number] Program Recall PROCEDURE DATE:  10/27/2015 PROCEDURE:   Colonoscopy, surveillance and Colonoscopy with snare polypectomy X4 First Screening Colonoscopy - Avg.  risk and is 50 yrs.  old or older - No.  Prior Negative Screening - Now for repeat screening. N/A  History of Adenoma - Now for follow-up colonoscopy & has been > or = to 3 yrs.  Yes hx of adenoma.  Has been 3 or more years since last colonoscopy.  Polyps removed today? Yes ASA CLASS:   Class III INDICATIONS:Surveillance due to prior colonic neoplasia and PH Colon Adenoma. Prior examinations 2005 2011 (small TAs). MEDICATIONS: Monitored anesthesia care and Propofol 400 mg IV  DESCRIPTION OF PROCEDURE:   After the risks benefits and alternatives of the procedure were thoroughly explained, informed consent was obtained.  The digital rectal exam revealed hemorrhoids.   The LB [id number]  PEDIATRIC endoscope was introduced through the anus and advanced to the cecum, which was identified by both the appendix and ileocecal valve. No adverse events experienced.   The quality of the prep was good(upgraded from fair with vigorous irrigation, suctioning, and time).  (Suprep was used)  The instrument was then slowly withdrawn as the colon was fully examined. Estimated blood loss is zero unless otherwise noted in this procedure report.  COLON FINDINGS: Four polyps ranging from 2 to 77mm in size were found in the ascending colon, at the cecum, in the sigmoid colon, and transverse colon.  A polypectomy was performed with a cold snare. The resection was complete, the polyp tissue was completely retrieved and sent to histology.   There was severe diverticulosis with marked stenosis in  the sigmoid colon.   The examination was otherwise normal.  Retroflexed views revealed internal hemorrhoids. The time to cecum = 6.0 Withdrawal time = 26.4   The scope was withdrawn and the procedure completed. COMPLICATIONS: There were no immediate complications.  ENDOSCOPIC IMPRESSION: 1.   Four tiny polyps were found in the colon; polypectomy was performed with a cold snare 2.   Severe diverticulosis with stenosis in the sigmoid colon 3.   The examination was otherwise normal  RECOMMENDATIONS: 1.  Continue MiraLAX daily to keep your bowels regular 2.  Return to the care of your primary provider.  GI follow up as needed  eSigned:  Eustace Quail, MD 10/27/2015 2:01 PM   cc: The Patient and Hendricks Limes, MD

## 2015-10-27 NOTE — Progress Notes (Signed)
A/ox3 pleased with MAC, report to Jane RN 

## 2015-10-28 ENCOUNTER — Telehealth: Payer: Self-pay

## 2015-10-28 NOTE — Telephone Encounter (Signed)
  Follow up Call-  Call back number 10/27/2015  Post procedure Call Back phone  # [phone]  Permission to leave phone message Yes     Patient questions:  Do you have a fever, pain , or abdominal swelling? No. Pain Score  0 *  Have you tolerated food without any problems? Yes.    Have you been able to return to your normal activities? Yes.    Do you have any questions about your discharge instructions: Diet   No. Medications  No. Follow up visit  No.  Do you have questions or concerns about your Care? No.  Actions: * If pain score is 4 or above: No action needed, pain <4.

## 2015-11-01 ENCOUNTER — Encounter: Payer: Self-pay | Admitting: Internal Medicine

## 2015-11-16 ENCOUNTER — Other Ambulatory Visit: Payer: Self-pay | Admitting: Internal Medicine

## 2015-11-17 NOTE — Telephone Encounter (Signed)
LVM informing pt to call back to schedule appt with new PCP in order for refills to be sent in.

## 2015-11-29 DIAGNOSIS — M47816 Spondylosis without myelopathy or radiculopathy, lumbar region: Secondary | ICD-10-CM | POA: Diagnosis not present

## 2015-12-02 ENCOUNTER — Ambulatory Visit (INDEPENDENT_AMBULATORY_CARE_PROVIDER_SITE_OTHER): Payer: Medicare Other | Admitting: Family Medicine

## 2015-12-02 ENCOUNTER — Encounter: Payer: Self-pay | Admitting: Family Medicine

## 2015-12-02 VITALS — BP 124/78 | HR 63 | Temp 98.3°F | Ht 70.0 in | Wt 218.0 lb

## 2015-12-02 DIAGNOSIS — E119 Type 2 diabetes mellitus without complications: Secondary | ICD-10-CM

## 2015-12-02 DIAGNOSIS — I10 Essential (primary) hypertension: Secondary | ICD-10-CM | POA: Diagnosis not present

## 2015-12-02 DIAGNOSIS — E785 Hyperlipidemia, unspecified: Secondary | ICD-10-CM

## 2015-12-02 LAB — COMPREHENSIVE METABOLIC PANEL
ALK PHOS: 77 U/L (ref 39–117)
ALT: 10 U/L (ref 0–53)
AST: 12 U/L (ref 0–37)
Albumin: 4.6 g/dL (ref 3.5–5.2)
BILIRUBIN TOTAL: 0.4 mg/dL (ref 0.2–1.2)
BUN: 21 mg/dL (ref 6–23)
CO2: 31 meq/L (ref 19–32)
Calcium: 9.4 mg/dL (ref 8.4–10.5)
Chloride: 102 mEq/L (ref 96–112)
Creatinine, Ser: 1.28 mg/dL (ref 0.40–1.50)
GFR: 57.75 mL/min — ABNORMAL LOW (ref >60.00)
GLUCOSE: 110 mg/dL — AB (ref 70–99)
Potassium: 4.4 mEq/L (ref 3.5–5.1)
SODIUM: 140 meq/L (ref 135–145)
TOTAL PROTEIN: 7.2 g/dL (ref 6.0–8.3)

## 2015-12-02 LAB — LIPID PANEL
CHOL/HDL RATIO: 4
Cholesterol: 198 mg/dL (ref 0–200)
HDL: 53.3 mg/dL (ref >39.00)
NONHDL: 144.61
TRIGLYCERIDES: 208 mg/dL — AB (ref 0.0–149.0)
VLDL: 41.6 mg/dL — ABNORMAL HIGH (ref 0.0–40.0)

## 2015-12-02 LAB — HEMOGLOBIN A1C: Hgb A1c MFr Bld: 6.9 % — ABNORMAL HIGH (ref 4.6–6.5)

## 2015-12-02 LAB — LDL CHOLESTEROL, DIRECT: Direct LDL: 120 mg/dL

## 2015-12-02 LAB — TSH: TSH: 1.07 u[IU]/mL (ref 0.35–4.50)

## 2015-12-02 MED ORDER — PIOGLITAZONE HCL 15 MG PO TABS: 15.0000 mg | ORAL_TABLET | Freq: Every day | ORAL | Status: DC

## 2015-12-02 MED ORDER — ATORVASTATIN CALCIUM 20 MG PO TABS: 20.0000 mg | ORAL_TABLET | Freq: Every day | ORAL | Status: DC

## 2015-12-02 NOTE — Progress Notes (Signed)
Patient ID: Louis [name], male    DOB: 09-Nov-1935  Age: 80 y.o. MRN: [id number]    Subjective:  Subjective HPI Louis [name] presents for f/u dm , cholesterol, and htn.  HPI HYPERTENSION  Blood pressure range-not checking  Chest pain- no      Dyspnea- no Lightheadedness- no   Edema- no Other side effects - no   Medication compliance: good Low salt diet- yes  DIABETES  Blood Sugar ranges-106-130  Polyuria- no New Visual problems- no Hypoglycemic symptoms- no Other side effects-no Medication compliance - good Last eye exam- dec 2016 Foot exam- today  HYPERLIPIDEMIA  Medication compliance- no RUQ pain- no  Muscle aches- no Other side effects-no   Review of Systems  Constitutional: Negative for diaphoresis, appetite change, fatigue and unexpected weight change.  Eyes: Negative for pain, redness and visual disturbance.  Respiratory: Negative for cough, chest tightness, shortness of breath and wheezing.   Cardiovascular: Negative for chest pain, palpitations and leg swelling.  Endocrine: Negative for cold intolerance, heat intolerance, polydipsia, polyphagia and polyuria.  Genitourinary: Negative for dysuria, frequency and difficulty urinating.  Neurological: Negative for dizziness, light-headedness, numbness and headaches.    History Past Medical History  Diagnosis Date  . Anemia   . Diabetes mellitus   . Hypertension   . Hyperlipidemia   . Diverticulosis   . Testosterone deficiency     111 in 2007  . Reflux esophagitis   . Spinal stenosis     Dr.Ramos  . Hemochromatosis      Dr Henrene Pastor  . Arthritis     Dr Lurline Hare, MTX rx  . Hypothyroidism   . SBO (small bowel obstruction) ([place]) 2004  . Kidney stones, calcium oxalate     1985 & 2005  . Hx of adenomatous colonic polyps   . Internal hemorrhoids   . IBS (irritable bowel syndrome)     He has past surgical history that includes Appendectomy; Shoulder surgery; Colonoscopy w/ polypectomy (2005);  lumbar nerve block; Total shoulder replacement; Total knee arthroplasty ( 201 & 2012); Refractive surgery (2005); Tonsillectomy and adenoidectomy; cataract (2005); TM replacement (1981); Middle ear surgery (1980); and L4-5 & L5- S1 bilat facet injections (01/20/14).   His family history includes Breast cancer in his maternal grandmother; Depression in his brother; Diabetes in his father and maternal aunt; Heart disease in his paternal aunt and paternal uncle; Heart failure in his mother; Stroke (age of onset: 67) in his father. There is no history of Colon cancer, COPD, or Asthma.He reports that he quit smoking about 35 years ago. He has never used smokeless tobacco. He reports that he drinks alcohol. He reports that he does not use illicit drugs.  Current Outpatient Prescriptions on File Prior to Visit  Medication Sig Dispense Refill  . ALPRAZolam (XANAX) 0.5 MG tablet Take 1 tablet (0.5 mg total) by mouth every 8 (eight) hours as needed. 1/2 q 8 -12 hrs prn only 30 tablet 2  . amoxicillin (AMOXIL) 500 MG tablet Take 500 mg by mouth daily as needed (dental procedure). Reported on 10/27/2015    . CALCIUM-VITAMIN D PO Take 1 tablet by mouth daily.     . cholecalciferol (VITAMIN D) 1000 UNITS tablet Take 1,000 Units by mouth daily.    Marland Kitchen dicyclomine (BENTYL) 10 MG capsule TAKE ONE CAPSULE BY MOUTH THREE TIMES DAILY BEFORE MEALS 60 capsule 3  . diphenhydrAMINE (BENADRYL) 25 mg capsule Take 25 mg by mouth at bedtime as needed for allergies (allergies).  Reported on 2/70/3500    . folic acid (FOLVITE) 1 MG tablet Take 1 mg by mouth daily.      Marland Kitchen glucose blood test strip Check blood sugar once daily, DX:250.00 100 each 11  . HYDROcodone-acetaminophen (NORCO) 10-325 MG per tablet Take 1 tablet by mouth every 4 (four) hours as needed for moderate pain or severe pain (pain).     . Lancets (FREESTYLE) lancets Test blood sugar daily. Dx code: 250.00 100 each 12  . levothyroxine (SYNTHROID, LEVOTHROID) 125 MCG  tablet Take 1 tablet (125 mcg total) by mouth daily. 90 tablet 3  . lisinopril (PRINIVIL,ZESTRIL) 20 MG tablet TAKE 1 TABLET BY MOUTH EVERY DAY 90 tablet 1  . methocarbamol (ROBAXIN) 500 MG tablet Take 500 mg by mouth every 8 (eight) hours as needed for muscle spasms (muscle spasm). Reported on 10/27/2015  2  . methotrexate (RHEUMATREX) 2.5 MG tablet Take 15 mg by mouth once a week. Monday Night.    . multivitamin (THERAGRAN) per tablet Take 1 tablet by mouth daily.      Marland Kitchen omeprazole (PRILOSEC OTC) 20 MG tablet Take 20 mg by mouth daily.    . ondansetron (ZOFRAN) 4 MG tablet Take 1 tablet (4 mg total) by mouth every 6 (six) hours as needed for nausea or vomiting. for nausea 40 tablet 0  . polyethylene glycol (MIRALAX / GLYCOLAX) packet Take 17 g by mouth 2 (two) times daily. 14 each 0  . vitamin B-12 (CYANOCOBALAMIN) 1000 MCG tablet Take 1,000 mcg by mouth daily.     No current facility-administered medications on file prior to visit.     Objective:  Objective Physical Exam  Constitutional: He is oriented to person, place, and time. Vital signs are normal. He appears well-developed and well-nourished. He is sleeping.  HENT:  Head: Normocephalic and atraumatic.  Mouth/Throat: Oropharynx is clear and moist.  Eyes: EOM are normal. Pupils are equal, round, and reactive to light.  Neck: Normal range of motion. Neck supple. No thyromegaly present.  Cardiovascular: Normal rate and regular rhythm.   No murmur heard. Pulmonary/Chest: Effort normal and breath sounds normal. No respiratory distress. He has no wheezes. He has no rales. He exhibits no tenderness.  Musculoskeletal: He exhibits no edema or tenderness.  Neurological: He is alert and oriented to person, place, and time.  Skin: Skin is warm and dry.  Psychiatric: He has a normal mood and affect. His behavior is normal. Judgment and thought content normal.  Nursing note and vitals reviewed. Sensory exam of the foot is normal, tested with  the monofilament. Good pulses, no lesions or ulcers, good peripheral pulses.  BP 124/78 mmHg  Pulse 63  Temp(Src) 98.3 F (36.8 C) (Oral)  Ht '5\' 10"'$  (1.778 m)  Wt 218 lb (98.884 kg)  BMI 31.28 kg/m2  SpO2 98% Wt Readings from Last 3 Encounters:  12/02/15 218 lb (98.884 kg)  10/27/15 223 lb (101.152 kg)  10/18/15 223 lb 8 oz (101.379 kg)     Lab Results  Component Value Date   WBC 6.4 09/06/2014   HGB 12.8* 09/06/2014   HCT 39.2 09/06/2014   PLT 215.0 09/06/2014   GLUCOSE 110* 12/02/2015   CHOL 198 12/02/2015   TRIG 208.0* 12/02/2015   HDL 53.30 12/02/2015   LDLDIRECT 120.0 12/02/2015   LDLCALC 61 05/18/2014   ALT 10 12/02/2015   AST 12 12/02/2015   NA 140 12/02/2015   K 4.4 12/02/2015   CL 102 12/02/2015   CREATININE 1.28 12/02/2015  BUN 21 12/02/2015   CO2 31 12/02/2015   TSH 1.07 12/02/2015   PSA 0.86 08/28/2010   INR 1.00 08/17/2014   HGBA1C 6.9* 12/02/2015   MICROALBUR 3.0* 12/08/2014    Ct Angio Chest Pe W/cm &/or Wo Cm  08/17/2014  CLINICAL DATA:  Pulmonary embolism detected on CT abdomen/ pelvis exam of 08/17/2014 EXAM: CT ANGIOGRAPHY CHEST WITH CONTRAST TECHNIQUE: Multidetector CT imaging of the chest was performed using the standard protocol during bolus administration of intravenous contrast. Multiplanar CT image reconstructions and MIPs were obtained to evaluate the vascular anatomy. CONTRAST:  60m OMNIPAQUE IOHEXOL 350 MG/ML SOLN COMPARISON:  CT abdomen and pelvis 08/17/2014, CT abdomen and pelvis 10/10/2010 FINDINGS: Scattered atherosclerotic calcifications aorta and coronary arteries. Aorta normal caliber without aneurysm or dissection. Again identified small embolus within RIGHT lower lobe pulmonary artery no additional pulmonary emboli identified. LV/RV ratio is elevated at 1.12 though ratio is also elevated at 1.0 on a prior CT from 2012; in light of the small embolus identified on the current study and the previous RIGHT ventricular dilatation,  suspect elevated ratio is related to underlying RIGHT ventricular dysfunction rather than RIGHT heart strain. Visualized upper abdomen unremarkable. No thoracic adenopathy. Beam hardening artifacts from LEFT shoulder prosthesis. Emphysematous changes with mild central peribronchial thickening. No acute infiltrate, pleural effusion, or pneumothorax. Bones appear demineralized. Review of the MIP images confirms the above findings. IMPRESSION: Small RIGHT lower lobe pulmonary embolus as identified on earlier CT abdomen/ pelvis exam. No additional pulmonary emboli identified. COPD changes. Osseous demineralization. Electronically Signed   By: MLavonia DanaM.D.   On: 08/17/2014 20:38   Dg Abd 2 Views  08/18/2014  CLINICAL DATA:  Nausea, vomiting, constipation. EXAM: ABDOMEN - 2 VIEW COMPARISON:  None. FINDINGS: There is nonspecific nonobstructive bowel gas pattern. Moderate stool noted in right colon and transverse colon. Some stool and gas noted in rectosigmoid colon. No free abdominal air. IMPRESSION: Nonspecific nonobstructive bowel gas pattern. Moderate stool in proximal colon. Electronically Signed   By: LLahoma CrockerM.D.   On: 08/18/2014 15:30   Ct Angio Abd/pel W/ And/or W/o  08/17/2014  CLINICAL DATA:  Acute nausea, vomiting, abdominal pain with rectal bleeding 1 week ago. EXAM: CTA ABDOMEN AND PELVIS WITHOUT CONTRAST TECHNIQUE: Multidetector CT imaging of the abdomen and pelvis was performed using the standard protocol during bolus administration of intravenous contrast. Multiplanar reconstructed images and MIPs were obtained and reviewed to evaluate the vascular anatomy. CONTRAST:  1025mOMNIPAQUE IOHEXOL 350 MG/ML SOLN COMPARISON:  03/17/2013 FINDINGS: Lower chest: Clear lung bases. Right lower lobe pulmonary artery branching filling defect noted, image 12 compatible witha small right lower lobe pulmonary embolus. Normal heart size. No pericardial or pleural effusion. No hiatal hernia. Abdomen pelvis:  Atherosclerosis noted of the abdominal aorta diffusely without significant aneurysm, dissection, retroperitoneal hemorrhage, or occlusive process. Minimal atherosclerosis of the celiac origin with slight narrowing, less than 30%. SMA, main renal arteries, and IMA are all visualized and patent. No evidence of significant mesenteric or renal vascular occlusive process. No accessory renal vasculature. Mild tortuosity of the pelvic iliac vessels without occlusion, dissection or aneurysm. Visualize common femoral, proximal profunda femoral, and proximal superficial femoral arteries demonstrated are all patent. No acute vascular process. Nonvascular: Liver demonstrates a stable minimally complex septated 16 mm cyst in the right hepatic lobe inferiorly, image 35 series 5. No other significant hepatic abnormality. No biliary dilatation. Patent portal and hepatic veins. Collapsed gallbladder, biliary system, pancreas, spleen, and adrenal glands are  within normal limits for age and demonstrate no acute process. Kidneys demonstrate no renal obstruction or hydronephrosis. Incidental 5 mm punctate nonobstructing calculus in the right kidney lower pole, image 46. Negative for bowel obstruction, dilatation, ileus, or free air. No visualized pneumatosis. Similar pattern of colonic diverticulosis, most pronounced in the sigmoid region. No surrounding inflammatory change about the colon. No abdominal or pelvic free fluid, fluid collection, hemorrhage, abscess or adenopathy. No inguinal abnormality, or hernia. Prostate calcifications noted. Degenerative changes of the lower lumbar spine and lumbar process. Review of the MIP images confirms the above findings. IMPRESSION: Patent mesenteric vasculature. Negative for mesenteric vascular occlusive disease. Patent renal vasculature as well. Aortoiliac atherosclerosis without acute process or significant occlusive vascular disease in the abdomen or pelvis. Small right lower lobe pulmonary  embolus. Stable small right inferior hepatic cyst Colonic diverticulosis These results will be called to the ordering clinician or representative by the Radiologist Assistant, and communication documented in the PACS or zVision Dashboard. Electronically Signed   By: Daryll Brod M.D.   On: 08/17/2014 15:01     Assessment & Plan:  Plan I am having Mr. Relph maintain his CALCIUM-VITAMIN D PO, folic acid, HYDROcodone-acetaminophen, diphenhydrAMINE, methotrexate, multivitamin, omeprazole, glucose blood, freestyle, methocarbamol, cholecalciferol, vitamin B-12, amoxicillin, polyethylene glycol, lisinopril, levothyroxine, dicyclomine, ALPRAZolam, ondansetron, atorvastatin, and pioglitazone.  Meds ordered this encounter  Medications  . atorvastatin (LIPITOR) 20 MG tablet    Sig: Take 1 tablet (20 mg total) by mouth daily at 6 PM.    Dispense:  90 tablet    Refill:  3  . pioglitazone (ACTOS) 15 MG tablet    Sig: Take 1 tablet (15 mg total) by mouth daily.    Dispense:  90 tablet    Refill:  3    Problem List Items Addressed This Visit    None    Visit Diagnoses    Hyperlipidemia LDL goal <70    -  Primary    Relevant Medications    atorvastatin (LIPITOR) 20 MG tablet    Other Relevant Orders    Comp Met (CMET) (Completed)    Hemoglobin A1c (Completed)    Lipid panel (Completed)    POCT urinalysis dipstick    TSH (Completed)    Controlled type 2 diabetes mellitus without complication, without long-term current use of insulin (HCC)        Relevant Medications    atorvastatin (LIPITOR) 20 MG tablet    pioglitazone (ACTOS) 15 MG tablet    Other Relevant Orders    Comp Met (CMET) (Completed)    Hemoglobin A1c (Completed)    Lipid panel (Completed)    POCT urinalysis dipstick    TSH (Completed)    Essential hypertension        Relevant Medications    atorvastatin (LIPITOR) 20 MG tablet    Other Relevant Orders    Comp Met (CMET) (Completed)    Lipid panel (Completed)           Follow-up: Return in about 6 months (around 06/03/2016), or if symptoms worsen or fail to improve.  Garnet Koyanagi, DO

## 2015-12-02 NOTE — Patient Instructions (Signed)

## 2015-12-02 NOTE — Progress Notes (Signed)
Pre visit review using our clinic review tool, if applicable. No additional management support is needed unless otherwise documented below in the visit note. 

## 2015-12-13 DIAGNOSIS — Z79891 Long term (current) use of opiate analgesic: Secondary | ICD-10-CM | POA: Diagnosis not present

## 2015-12-13 DIAGNOSIS — M47816 Spondylosis without myelopathy or radiculopathy, lumbar region: Secondary | ICD-10-CM | POA: Diagnosis not present

## 2015-12-13 DIAGNOSIS — G894 Chronic pain syndrome: Secondary | ICD-10-CM | POA: Diagnosis not present

## 2015-12-13 DIAGNOSIS — M5136 Other intervertebral disc degeneration, lumbar region: Secondary | ICD-10-CM | POA: Diagnosis not present

## 2016-01-11 ENCOUNTER — Other Ambulatory Visit: Payer: Self-pay | Admitting: Internal Medicine

## 2016-01-30 DIAGNOSIS — M5136 Other intervertebral disc degeneration, lumbar region: Secondary | ICD-10-CM | POA: Diagnosis not present

## 2016-01-30 DIAGNOSIS — G894 Chronic pain syndrome: Secondary | ICD-10-CM | POA: Diagnosis not present

## 2016-01-30 DIAGNOSIS — M47816 Spondylosis without myelopathy or radiculopathy, lumbar region: Secondary | ICD-10-CM | POA: Diagnosis not present

## 2016-01-30 DIAGNOSIS — Z79891 Long term (current) use of opiate analgesic: Secondary | ICD-10-CM | POA: Diagnosis not present

## 2016-02-10 DIAGNOSIS — M5136 Other intervertebral disc degeneration, lumbar region: Secondary | ICD-10-CM | POA: Diagnosis not present

## 2016-02-16 DIAGNOSIS — M5136 Other intervertebral disc degeneration, lumbar region: Secondary | ICD-10-CM | POA: Diagnosis not present

## 2016-02-16 DIAGNOSIS — M4807 Spinal stenosis, lumbosacral region: Secondary | ICD-10-CM | POA: Diagnosis not present

## 2016-02-16 DIAGNOSIS — Z79891 Long term (current) use of opiate analgesic: Secondary | ICD-10-CM | POA: Diagnosis not present

## 2016-02-16 DIAGNOSIS — G894 Chronic pain syndrome: Secondary | ICD-10-CM | POA: Diagnosis not present

## 2016-03-21 ENCOUNTER — Other Ambulatory Visit: Payer: Self-pay | Admitting: Internal Medicine

## 2016-04-09 ENCOUNTER — Other Ambulatory Visit: Payer: Self-pay | Admitting: Internal Medicine

## 2016-04-09 NOTE — Telephone Encounter (Signed)
Routing to patient's new pcp to handle 

## 2016-04-19 DIAGNOSIS — M4806 Spinal stenosis, lumbar region: Secondary | ICD-10-CM | POA: Diagnosis not present

## 2016-04-25 ENCOUNTER — Other Ambulatory Visit: Payer: Self-pay | Admitting: Neurological Surgery

## 2016-05-08 ENCOUNTER — Other Ambulatory Visit: Payer: Self-pay

## 2016-05-08 MED ORDER — LISINOPRIL 20 MG PO TABS: 20.0000 mg | ORAL_TABLET | Freq: Every day | ORAL | 1 refills | Status: DC

## 2016-05-08 MED ORDER — LEVOTHYROXINE SODIUM 125 MCG PO TABS: ORAL_TABLET | ORAL | 1 refills | Status: DC

## 2016-06-11 ENCOUNTER — Other Ambulatory Visit: Payer: Self-pay | Admitting: Family Medicine

## 2016-06-11 DIAGNOSIS — F411 Generalized anxiety disorder: Secondary | ICD-10-CM

## 2016-06-11 NOTE — Telephone Encounter (Signed)
Last seen 12/02/15 Last filled #30-2 refills (Medication prescribed by Dr. Unice Cobble, Md) Please advise----PC

## 2016-07-18 DIAGNOSIS — E119 Type 2 diabetes mellitus without complications: Secondary | ICD-10-CM | POA: Diagnosis not present

## 2016-07-18 DIAGNOSIS — H353132 Nonexudative age-related macular degeneration, bilateral, intermediate dry stage: Secondary | ICD-10-CM | POA: Diagnosis not present

## 2016-07-18 NOTE — Pre-Procedure Instructions (Signed)
Louis [name].  07/18/2016      [place], [place] [address] [place] [address] Phone: [phone] Fax: [phone]  [facility] [place], [address] [place] [address] Phone: [phone] Fax: [phone]  [place], [place] [address] [place] [address] Phone: [phone] Fax: [phone]    Your procedure is scheduled on Mon, Oct 30 @ 7:30 AM  Report to [facility] at 5:30 AM  Call this number if you have problems the morning of surgery:  [phone]   Remember:  Do not eat food or drink liquids after midnight.  Take these medicines the morning of surgery with A SIP OF WATER Alprazolam(Xanax),Pain Pill(if needed),Synthroid(Levothyroxine),Omeprazole(Prilosec),and Ondansetron(Zofran-if needed)            Stop taking your Aspirin along with any Vitamins or Herbal Medications. No Goody's,BC's,Aleve,Advil,Motrin,or Fish Oil.      How to Manage Your Diabetes Before and After Surgery  Why is it important to control my blood sugar before and after surgery? . Improving blood sugar levels before and after surgery helps healing and can limit problems. . A way of improving blood sugar control is eating a healthy diet by: o  Eating less sugar and carbohydrates o  Increasing activity/exercise o  Talking with your doctor about reaching your blood sugar goals . High blood sugars (greater than 180 mg/dL) can raise your risk of infections and slow your recovery, so you will need to focus on controlling your diabetes during the weeks before surgery. . Make sure that the doctor who takes care of your diabetes knows about your planned surgery including the date and location.  How do I manage my blood sugar before surgery? . Check your blood sugar at least 4 times a day, starting 2 days  before surgery, to make sure that the level is not too high or low. o Check your blood sugar the morning of your surgery when you wake up and every 2 hours until you get to the Short Stay unit. . If your blood sugar is less than 70 mg/dL, you will need to treat for low blood sugar: o Do not take insulin. o Treat a low blood sugar (less than 70 mg/dL) with  cup of clear juice (cranberry or apple), 4 glucose tablets, OR glucose gel. o Recheck blood sugar in 15 minutes after treatment (to make sure it is greater than 70 mg/dL). If your blood sugar is not greater than 70 mg/dL on recheck, call [phone] for further instructions. . Report your blood sugar to the short stay nurse when you get to Short Stay.  . If you are admitted to the hospital after surgery: o Your blood sugar will be checked by the staff and you will probably be given insulin after surgery (instead of oral diabetes medicines) to make sure you have good blood sugar levels. o The goal for blood sugar control after surgery is 80-180 mg/dL.              WHAT DO I DO ABOUT MY DIABETES MEDICATION?   Marland Kitchen Do not take oral diabetes medicines (pills) the morning of surgery.   . The day of surgery, do not take other diabetes injectables, including Byetta (exenatide), Bydureon (exenatide ER), Victoza (liraglutide), or Trulicity (dulaglutide).  Marland Kitchen  If your CBG is greater than 220 mg/dL, you may take  of your sliding scale (correction) dose of insulin.  Other Instructions:          Patient Signature:  Date:   Nurse Signature:  Date:   Reviewed and Endorsed by [facility] Patient Education Committee, August 2015    Do not wear jewelry, make-up or nail polish.  Do not wear lotions, powders, or perfumes, or deoderant.  Do not shave 48 hours prior to surgery.  Men may shave face and neck.  Do not bring valuables to the hospital.  [facility] is not responsible for any belongings or valuables.  Contacts, dentures or  bridgework may not be worn into surgery.  Leave your suitcase in the car.  After surgery it may be brought to your room.  For patients admitted to the hospital, discharge time will be determined by your treatment team.  Patients discharged the day of surgery will not be allowed to drive home.    [facility]  Before surgery, you can play an important role.  Because skin is not sterile, your skin needs to be as free of germs as possible.  You can reduce the number of germs on you skin by washing with CHG (chlorahexidine gluconate) soap before surgery.  CHG is an antiseptic cleaner which kills germs and bonds with the skin to continue killing germs even after washing.  Please DO NOT use if you have an allergy to CHG or antibacterial soaps.  If your skin becomes reddened/irritated stop using the CHG and inform your nurse when you arrive at Short Stay.  Do not shave (including legs and underarms) for at least 48 hours prior to the first CHG shower.  You may shave your face.  Please follow these instructions carefully:   1.  Shower with CHG Soap the night before surgery and the                                morning of Surgery.  2.  If you choose to wash your hair, wash your hair first as usual with your       normal shampoo.  3.  After you shampoo, rinse your hair and body thoroughly to remove the                      Shampoo.  4.  Use CHG as you would any other liquid soap.  You can apply chg directly       to the skin and wash gently with scrungie or a clean washcloth.  5.  Apply the CHG Soap to your body ONLY FROM THE NECK DOWN.        Do not use on open wounds or open sores.  Avoid contact with your eyes,       ears, mouth and genitals (private parts).  Wash genitals (private parts)       with your normal soap.  6.  Wash thoroughly, paying special attention to the area where your surgery        will be performed.  7.  Thoroughly rinse your body with  warm water from the neck down.  8.  DO NOT shower/wash with your normal soap after using and rinsing off       the CHG Soap.  9.  Pat yourself dry with a clean towel.  10.  Wear clean pajamas.            11.  Place clean sheets on your bed the night of your first shower and do not        sleep with pets.  Day of Surgery  Do not apply any lotions/deoderants the morning of surgery.  Please wear clean clothes to the hospital/surgery center.   Please read over the following fact sheets that you were given. Pain Booklet, Coughing and Deep Breathing, MRSA Information and Surgical Site Infection Prevention

## 2016-07-19 ENCOUNTER — Encounter (HOSPITAL_COMMUNITY): Payer: Self-pay

## 2016-07-19 ENCOUNTER — Other Ambulatory Visit: Payer: Self-pay

## 2016-07-19 ENCOUNTER — Encounter (HOSPITAL_COMMUNITY)
Admission: RE | Admit: 2016-07-19 | Discharge: 2016-07-19 | Disposition: A | Discharge status: Home / Self Care | Payer: Medicare Other | Source: Ambulatory Visit | Attending: Neurological Surgery | Admitting: Neurological Surgery

## 2016-07-19 DIAGNOSIS — Z01812 Encounter for preprocedural laboratory examination: Secondary | ICD-10-CM | POA: Diagnosis not present

## 2016-07-19 DIAGNOSIS — Z23 Encounter for immunization: Secondary | ICD-10-CM | POA: Diagnosis not present

## 2016-07-19 HISTORY — DX: Gastro-esophageal reflux disease without esophagitis: K21.9

## 2016-07-19 HISTORY — DX: Other pulmonary embolism without acute cor pulmonale: I26.99

## 2016-07-19 HISTORY — DX: Personal history of urinary calculi: Z87.442

## 2016-07-19 LAB — CBC
HCT: 39.2 % (ref 39.0–52.0)
HEMOGLOBIN: 12.8 g/dL — AB (ref 13.0–17.0)
MCH: 30.6 pg (ref 26.0–34.0)
MCHC: 32.7 g/dL (ref 30.0–36.0)
MCV: 93.8 fL (ref 78.0–100.0)
Platelets: 289 10*3/uL (ref 150–400)
RBC: 4.18 MIL/uL — AB (ref 4.22–5.81)
RDW: 15 % (ref 11.5–15.5)
WBC: 7.4 10*3/uL (ref 4.0–10.5)

## 2016-07-19 LAB — BASIC METABOLIC PANEL
Anion gap: 8 (ref 5–15)
BUN: 14 mg/dL (ref 6–20)
CHLORIDE: 107 mmol/L (ref 101–111)
CO2: 26 mmol/L (ref 22–32)
Calcium: 9.1 mg/dL (ref 8.9–10.3)
Creatinine, Ser: 1.36 mg/dL — ABNORMAL HIGH (ref 0.61–1.24)
GFR calc Af Amer: 56 mL/min — ABNORMAL LOW (ref >60)
GFR calc non Af Amer: 48 mL/min — ABNORMAL LOW (ref >60)
GLUCOSE: 123 mg/dL — AB (ref 65–99)
POTASSIUM: 4.2 mmol/L (ref 3.5–5.1)
SODIUM: 141 mmol/L (ref 135–145)

## 2016-07-19 LAB — SURGICAL PCR SCREEN
MRSA, PCR: NEGATIVE
Staphylococcus aureus: NEGATIVE

## 2016-07-19 LAB — GLUCOSE, CAPILLARY: Glucose-Capillary: 157 mg/dL — ABNORMAL HIGH (ref 65–99)

## 2016-07-19 NOTE — Progress Notes (Signed)
Pt. Followed by Dr. Amil Amen, for rheumatology, sees Dr. Etter Sjogren for PCP, pt. Denies all chest concerns, states he had a cardiac cath. 30 yrs. Ago with Dr. Rollene Fare, told his arteries are completely clear.  Pt. Reports a stress test 2013- wnl.

## 2016-07-20 LAB — HEMOGLOBIN A1C
Hgb A1c MFr Bld: 6.6 % — ABNORMAL HIGH (ref 4.8–5.6)
Mean Plasma Glucose: 143 mg/dL

## 2016-07-25 DIAGNOSIS — Z79891 Long term (current) use of opiate analgesic: Secondary | ICD-10-CM | POA: Diagnosis not present

## 2016-07-25 DIAGNOSIS — G894 Chronic pain syndrome: Secondary | ICD-10-CM | POA: Diagnosis not present

## 2016-07-25 DIAGNOSIS — M4807 Spinal stenosis, lumbosacral region: Secondary | ICD-10-CM | POA: Diagnosis not present

## 2016-07-25 DIAGNOSIS — M5136 Other intervertebral disc degeneration, lumbar region: Secondary | ICD-10-CM | POA: Diagnosis not present

## 2016-07-30 ENCOUNTER — Encounter (HOSPITAL_COMMUNITY): Payer: Self-pay | Admitting: *Deleted

## 2016-07-30 ENCOUNTER — Ambulatory Visit (HOSPITAL_COMMUNITY): Payer: Medicare Other

## 2016-07-30 ENCOUNTER — Inpatient Hospital Stay (HOSPITAL_COMMUNITY)
Admission: RE | Admit: 2016-07-30 | Discharge: 2016-07-31 | DRG: 516 | Disposition: A | Discharge status: Home / Self Care | Payer: Medicare Other | Source: Ambulatory Visit | Attending: Neurological Surgery | Admitting: Neurological Surgery

## 2016-07-30 ENCOUNTER — Ambulatory Visit (HOSPITAL_COMMUNITY): Payer: Medicare Other | Admitting: Anesthesiology

## 2016-07-30 ENCOUNTER — Encounter (HOSPITAL_COMMUNITY)
Admission: RE | Disposition: A | Discharge status: Home / Self Care | Payer: Self-pay | Source: Ambulatory Visit | Attending: Neurological Surgery

## 2016-07-30 DIAGNOSIS — M4726 Other spondylosis with radiculopathy, lumbar region: Secondary | ICD-10-CM | POA: Diagnosis not present

## 2016-07-30 DIAGNOSIS — I10 Essential (primary) hypertension: Secondary | ICD-10-CM | POA: Diagnosis present

## 2016-07-30 DIAGNOSIS — E1169 Type 2 diabetes mellitus with other specified complication: Secondary | ICD-10-CM | POA: Diagnosis present

## 2016-07-30 DIAGNOSIS — H9190 Unspecified hearing loss, unspecified ear: Secondary | ICD-10-CM | POA: Diagnosis present

## 2016-07-30 DIAGNOSIS — M48062 Spinal stenosis, lumbar region with neurogenic claudication: Secondary | ICD-10-CM | POA: Diagnosis not present

## 2016-07-30 DIAGNOSIS — K589 Irritable bowel syndrome without diarrhea: Secondary | ICD-10-CM | POA: Diagnosis not present

## 2016-07-30 DIAGNOSIS — M5116 Intervertebral disc disorders with radiculopathy, lumbar region: Secondary | ICD-10-CM | POA: Diagnosis present

## 2016-07-30 DIAGNOSIS — Y92234 Operating room of hospital as the place of occurrence of the external cause: Secondary | ICD-10-CM | POA: Diagnosis present

## 2016-07-30 DIAGNOSIS — Z87891 Personal history of nicotine dependence: Secondary | ICD-10-CM | POA: Diagnosis not present

## 2016-07-30 DIAGNOSIS — E7800 Pure hypercholesterolemia, unspecified: Secondary | ICD-10-CM | POA: Diagnosis not present

## 2016-07-30 DIAGNOSIS — F418 Other specified anxiety disorders: Secondary | ICD-10-CM | POA: Diagnosis present

## 2016-07-30 DIAGNOSIS — Z981 Arthrodesis status: Secondary | ICD-10-CM | POA: Diagnosis not present

## 2016-07-30 DIAGNOSIS — M9689 Other intraoperative and postprocedural complications and disorders of the musculoskeletal system: Secondary | ICD-10-CM | POA: Diagnosis not present

## 2016-07-30 DIAGNOSIS — M5489 Other dorsalgia: Secondary | ICD-10-CM | POA: Diagnosis not present

## 2016-07-30 DIAGNOSIS — R109 Unspecified abdominal pain: Secondary | ICD-10-CM | POA: Diagnosis present

## 2016-07-30 DIAGNOSIS — E039 Hypothyroidism, unspecified: Secondary | ICD-10-CM | POA: Diagnosis present

## 2016-07-30 DIAGNOSIS — S32048A Other fracture of fourth lumbar vertebra, initial encounter for closed fracture: Secondary | ICD-10-CM | POA: Diagnosis not present

## 2016-07-30 DIAGNOSIS — D649 Anemia, unspecified: Secondary | ICD-10-CM | POA: Diagnosis not present

## 2016-07-30 DIAGNOSIS — M1990 Unspecified osteoarthritis, unspecified site: Secondary | ICD-10-CM | POA: Diagnosis present

## 2016-07-30 DIAGNOSIS — Z419 Encounter for procedure for purposes other than remedying health state, unspecified: Secondary | ICD-10-CM

## 2016-07-30 DIAGNOSIS — M48061 Spinal stenosis, lumbar region without neurogenic claudication: Secondary | ICD-10-CM | POA: Diagnosis not present

## 2016-07-30 HISTORY — PX: LUMBAR LAMINECTOMY WITH COFLEX 2 LEVEL: SHX6515

## 2016-07-30 LAB — GLUCOSE, CAPILLARY
GLUCOSE-CAPILLARY: 101 mg/dL — AB (ref 65–99)
Glucose-Capillary: 147 mg/dL — ABNORMAL HIGH (ref 65–99)
Glucose-Capillary: 174 mg/dL — ABNORMAL HIGH (ref 65–99)
Glucose-Capillary: 150 mg/dL — ABNORMAL HIGH (ref 65–99)

## 2016-07-30 SURGERY — LUMBAR LAMINECTOMY WITH COFLEX 2 LEVEL
Anesthesia: General | Site: Back

## 2016-07-30 MED ORDER — LACTATED RINGERS IV SOLN
INTRAVENOUS | Status: DC | PRN
  Administered 2016-07-30: 09:00:00 via INTRAVENOUS
  Administered 2016-07-30: 1000 mL
  Administered 2016-07-30: 07:00:00 via INTRAVENOUS

## 2016-07-30 MED ORDER — HYDROMORPHONE HCL 1 MG/ML IJ SOLN
INTRAMUSCULAR | Status: AC
  Filled 2016-07-30: qty 1

## 2016-07-30 MED ORDER — OXYCODONE-ACETAMINOPHEN 5-325 MG PO TABS
1.0000 | ORAL_TABLET | ORAL | Status: DC | PRN
  Administered 2016-07-30 (×2): 2 via ORAL
  Administered 2016-07-30: 1 via ORAL
  Administered 2016-07-31: 2 via ORAL
  Administered 2016-07-31: 1 via ORAL
  Administered 2016-07-31: 2 via ORAL
  Filled 2016-07-30: qty 2
  Filled 2016-07-30: qty 1
  Filled 2016-07-30 (×2): qty 2
  Filled 2016-07-30: qty 1
  Filled 2016-07-30: qty 2

## 2016-07-30 MED ORDER — ALPRAZOLAM 0.5 MG PO TABS
0.5000 mg | ORAL_TABLET | Freq: Every day | ORAL | Status: DC
  Administered 2016-07-30: 0.5 mg via ORAL
  Filled 2016-07-30: qty 1

## 2016-07-30 MED ORDER — PHENYLEPHRINE HCL 10 MG/ML IJ SOLN
INTRAVENOUS | Status: DC | PRN
  Administered 2016-07-30: 40 ug/min via INTRAVENOUS

## 2016-07-30 MED ORDER — MIDAZOLAM HCL 2 MG/2ML IJ SOLN
INTRAMUSCULAR | Status: AC
  Filled 2016-07-30: qty 2

## 2016-07-30 MED ORDER — ONDANSETRON HCL 4 MG/2ML IJ SOLN
INTRAMUSCULAR | Status: AC
  Filled 2016-07-30: qty 2

## 2016-07-30 MED ORDER — LEVOTHYROXINE SODIUM 125 MCG PO TABS
125.0000 ug | ORAL_TABLET | Freq: Every day | ORAL | Status: DC
  Administered 2016-07-31: 125 ug via ORAL
  Filled 2016-07-30: qty 1

## 2016-07-30 MED ORDER — ATORVASTATIN CALCIUM 20 MG PO TABS
20.0000 mg | ORAL_TABLET | Freq: Every day | ORAL | Status: DC
  Administered 2016-07-30: 20 mg via ORAL
  Filled 2016-07-30: qty 1

## 2016-07-30 MED ORDER — ONDANSETRON HCL 4 MG PO TABS
4.0000 mg | ORAL_TABLET | Freq: Three times a day (TID) | ORAL | Status: DC | PRN
  Administered 2016-07-31 (×2): 4 mg via ORAL
  Filled 2016-07-30 (×2): qty 1

## 2016-07-30 MED ORDER — SENNA 8.6 MG PO TABS
1.0000 | ORAL_TABLET | Freq: Two times a day (BID) | ORAL | Status: DC
  Administered 2016-07-30 – 2016-07-31 (×2): 8.6 mg via ORAL
  Filled 2016-07-30 (×2): qty 1

## 2016-07-30 MED ORDER — MENTHOL 3 MG MT LOZG: 1.0000 | LOZENGE | OROMUCOSAL | Status: DC | PRN

## 2016-07-30 MED ORDER — PIOGLITAZONE HCL 15 MG PO TABS
15.0000 mg | ORAL_TABLET | Freq: Every day | ORAL | Status: DC
  Administered 2016-07-31: 15 mg via ORAL
  Filled 2016-07-30: qty 1

## 2016-07-30 MED ORDER — CHLORHEXIDINE GLUCONATE CLOTH 2 % EX PADS: 6.0000 | MEDICATED_PAD | Freq: Once | CUTANEOUS | Status: DC

## 2016-07-30 MED ORDER — BUPIVACAINE HCL (PF) 0.5 % IJ SOLN
INTRAMUSCULAR | Status: DC | PRN
  Administered 2016-07-30: 20 mL
  Administered 2016-07-30: 10 mL

## 2016-07-30 MED ORDER — KETOROLAC TROMETHAMINE 30 MG/ML IJ SOLN
INTRAMUSCULAR | Status: AC
  Administered 2016-07-30: 30 mg
  Filled 2016-07-30: qty 1

## 2016-07-30 MED ORDER — PROPOFOL 10 MG/ML IV BOLUS
INTRAVENOUS | Status: DC | PRN
  Administered 2016-07-30: 130 mg via INTRAVENOUS

## 2016-07-30 MED ORDER — SODIUM CHLORIDE 0.9% FLUSH
3.0000 mL | Freq: Two times a day (BID) | INTRAVENOUS | Status: DC
  Administered 2016-07-30 (×2): 3 mL via INTRAVENOUS

## 2016-07-30 MED ORDER — DOCUSATE SODIUM 100 MG PO CAPS
100.0000 mg | ORAL_CAPSULE | Freq: Two times a day (BID) | ORAL | Status: DC
  Administered 2016-07-30 – 2016-07-31 (×2): 100 mg via ORAL
  Filled 2016-07-30 (×2): qty 1

## 2016-07-30 MED ORDER — ROCURONIUM BROMIDE 100 MG/10ML IV SOLN
INTRAVENOUS | Status: DC | PRN
  Administered 2016-07-30 (×3): 5 mg via INTRAVENOUS
  Administered 2016-07-30: 40 mg via INTRAVENOUS

## 2016-07-30 MED ORDER — SUCCINYLCHOLINE CHLORIDE 200 MG/10ML IV SOSY
PREFILLED_SYRINGE | INTRAVENOUS | Status: AC
  Filled 2016-07-30: qty 10

## 2016-07-30 MED ORDER — HEMOSTATIC AGENTS (NO CHARGE) OPTIME
TOPICAL | Status: DC | PRN
  Administered 2016-07-30: 1 via TOPICAL

## 2016-07-30 MED ORDER — ACETAMINOPHEN 650 MG RE SUPP: 650.0000 mg | RECTAL | Status: DC | PRN

## 2016-07-30 MED ORDER — HYDROCODONE-ACETAMINOPHEN 10-325 MG PO TABS: 1.0000 | ORAL_TABLET | ORAL | Status: DC | PRN

## 2016-07-30 MED ORDER — MIDAZOLAM HCL 5 MG/5ML IJ SOLN
INTRAMUSCULAR | Status: DC | PRN
Start: 2016-07-30 — End: 2016-07-30
  Administered 2016-07-30: .5 mg via INTRAVENOUS

## 2016-07-30 MED ORDER — SODIUM CHLORIDE 0.9% FLUSH: 3.0000 mL | INTRAVENOUS | Status: DC | PRN

## 2016-07-30 MED ORDER — ACETAMINOPHEN 325 MG PO TABS: 650.0000 mg | ORAL_TABLET | ORAL | Status: DC | PRN

## 2016-07-30 MED ORDER — ONDANSETRON HCL 4 MG/2ML IJ SOLN: 4.0000 mg | INTRAMUSCULAR | Status: DC | PRN

## 2016-07-30 MED ORDER — TAMSULOSIN HCL 0.4 MG PO CAPS
0.4000 mg | ORAL_CAPSULE | Freq: Every day | ORAL | Status: DC
  Administered 2016-07-30 – 2016-07-31 (×2): 0.4 mg via ORAL
  Filled 2016-07-30 (×2): qty 1

## 2016-07-30 MED ORDER — ROCURONIUM BROMIDE 10 MG/ML (PF) SYRINGE
PREFILLED_SYRINGE | INTRAVENOUS | Status: AC
  Filled 2016-07-30: qty 10

## 2016-07-30 MED ORDER — THROMBIN 5000 UNITS EX SOLR
CUTANEOUS | Status: AC
  Filled 2016-07-30: qty 15000

## 2016-07-30 MED ORDER — HYDROMORPHONE HCL 1 MG/ML IJ SOLN
0.2500 mg | INTRAMUSCULAR | Status: DC | PRN
  Administered 2016-07-30 (×4): 0.5 mg via INTRAVENOUS

## 2016-07-30 MED ORDER — SODIUM CHLORIDE 0.9 % IR SOLN
Status: DC | PRN
  Administered 2016-07-30: 09:00:00

## 2016-07-30 MED ORDER — GLYCOPYRROLATE 0.2 MG/ML IJ SOLN
INTRAMUSCULAR | Status: DC | PRN
  Administered 2016-07-30: 0.4 mg via INTRAVENOUS

## 2016-07-30 MED ORDER — SODIUM CHLORIDE 0.9 % IJ SOLN
INTRAMUSCULAR | Status: AC
  Filled 2016-07-30: qty 10

## 2016-07-30 MED ORDER — HYDROMORPHONE HCL 1 MG/ML IJ SOLN: 0.5000 mg | INTRAMUSCULAR | Status: DC | PRN

## 2016-07-30 MED ORDER — ALPRAZOLAM 0.5 MG PO TABS
0.5000 mg | ORAL_TABLET | Freq: Three times a day (TID) | ORAL | Status: DC | PRN
  Administered 2016-07-30: 0.5 mg via ORAL
  Filled 2016-07-30: qty 1

## 2016-07-30 MED ORDER — LIDOCAINE 2% (20 MG/ML) 5 ML SYRINGE
INTRAMUSCULAR | Status: AC
  Filled 2016-07-30: qty 10

## 2016-07-30 MED ORDER — SUGAMMADEX SODIUM 200 MG/2ML IV SOLN
INTRAVENOUS | Status: AC
  Filled 2016-07-30: qty 2

## 2016-07-30 MED ORDER — LIDOCAINE-EPINEPHRINE 1 %-1:100000 IJ SOLN
INTRAMUSCULAR | Status: AC
  Filled 2016-07-30: qty 1

## 2016-07-30 MED ORDER — LIDOCAINE-EPINEPHRINE 1 %-1:100000 IJ SOLN
INTRAMUSCULAR | Status: DC | PRN
  Administered 2016-07-30: 10 mL

## 2016-07-30 MED ORDER — ONDANSETRON HCL 4 MG/2ML IJ SOLN
INTRAMUSCULAR | Status: DC | PRN
  Administered 2016-07-30: 4 mg via INTRAVENOUS

## 2016-07-30 MED ORDER — BISACODYL 10 MG RE SUPP: 10.0000 mg | Freq: Every day | RECTAL | Status: DC | PRN

## 2016-07-30 MED ORDER — PHENOL 1.4 % MT LIQD: 1.0000 | OROMUCOSAL | Status: DC | PRN

## 2016-07-30 MED ORDER — SUFENTANIL CITRATE 50 MCG/ML IV SOLN
INTRAVENOUS | Status: DC | PRN
  Administered 2016-07-30: 20 ug via INTRAVENOUS

## 2016-07-30 MED ORDER — ALUM & MAG HYDROXIDE-SIMETH 200-200-20 MG/5ML PO SUSP: 30.0000 mL | Freq: Four times a day (QID) | ORAL | Status: DC | PRN

## 2016-07-30 MED ORDER — SUGAMMADEX SODIUM 200 MG/2ML IV SOLN
INTRAVENOUS | Status: DC | PRN
  Administered 2016-07-30: 200 mg via INTRAVENOUS

## 2016-07-30 MED ORDER — PANTOPRAZOLE SODIUM 40 MG PO TBEC
40.0000 mg | DELAYED_RELEASE_TABLET | Freq: Every day | ORAL | Status: DC
  Administered 2016-07-31: 40 mg via ORAL
  Filled 2016-07-30: qty 1

## 2016-07-30 MED ORDER — METHOCARBAMOL 500 MG PO TABS
500.0000 mg | ORAL_TABLET | Freq: Four times a day (QID) | ORAL | Status: DC | PRN
  Administered 2016-07-30 – 2016-07-31 (×4): 500 mg via ORAL
  Filled 2016-07-30 (×4): qty 1

## 2016-07-30 MED ORDER — HYDROMORPHONE HCL 1 MG/ML IJ SOLN
INTRAMUSCULAR | Status: AC
  Filled 2016-07-30: qty 0.5

## 2016-07-30 MED ORDER — SUFENTANIL CITRATE 50 MCG/ML IV SOLN
INTRAVENOUS | Status: AC
  Filled 2016-07-30: qty 1

## 2016-07-30 MED ORDER — DEXAMETHASONE SODIUM PHOSPHATE 10 MG/ML IJ SOLN
INTRAMUSCULAR | Status: DC | PRN
  Administered 2016-07-30: 10 mg via INTRAVENOUS

## 2016-07-30 MED ORDER — PROPOFOL 10 MG/ML IV BOLUS
INTRAVENOUS | Status: AC
  Filled 2016-07-30: qty 20

## 2016-07-30 MED ORDER — CEFAZOLIN SODIUM-DEXTROSE 2-4 GM/100ML-% IV SOLN
2.0000 g | INTRAVENOUS | Status: AC
  Administered 2016-07-30: 2 g via INTRAVENOUS
  Filled 2016-07-30: qty 100

## 2016-07-30 MED ORDER — LISINOPRIL 20 MG PO TABS
10.0000 mg | ORAL_TABLET | Freq: Every day | ORAL | Status: DC
  Administered 2016-07-30: 10 mg via ORAL
  Filled 2016-07-30: qty 1

## 2016-07-30 MED ORDER — DIPHENHYDRAMINE HCL 25 MG PO CAPS
25.0000 mg | ORAL_CAPSULE | Freq: Every evening | ORAL | Status: DC | PRN
  Administered 2016-07-31: 25 mg via ORAL
  Filled 2016-07-30: qty 1

## 2016-07-30 MED ORDER — KETOROLAC TROMETHAMINE 15 MG/ML IJ SOLN
15.0000 mg | Freq: Four times a day (QID) | INTRAMUSCULAR | Status: AC
  Administered 2016-07-30 – 2016-07-31 (×4): 15 mg via INTRAVENOUS
  Filled 2016-07-30 (×5): qty 1

## 2016-07-30 MED ORDER — THROMBIN 5000 UNITS EX SOLR
CUTANEOUS | Status: DC | PRN
  Administered 2016-07-30 (×2): 5000 [IU] via TOPICAL

## 2016-07-30 MED ORDER — METHOCARBAMOL 500 MG PO TABS
ORAL_TABLET | ORAL | Status: AC
  Filled 2016-07-30: qty 1

## 2016-07-30 MED ORDER — DEXTROSE 5 % IV SOLN
500.0000 mg | Freq: Four times a day (QID) | INTRAVENOUS | Status: DC | PRN
  Filled 2016-07-30: qty 5

## 2016-07-30 MED ORDER — GLYCOPYRROLATE 0.2 MG/ML IV SOSY
PREFILLED_SYRINGE | INTRAVENOUS | Status: AC
  Filled 2016-07-30: qty 3

## 2016-07-30 MED ORDER — 0.9 % SODIUM CHLORIDE (POUR BTL) OPTIME
TOPICAL | Status: DC | PRN
  Administered 2016-07-30: 1000 mL

## 2016-07-30 MED ORDER — GELATIN ABSORBABLE MT POWD
OROMUCOSAL | Status: DC | PRN
  Administered 2016-07-30: 09:00:00 via TOPICAL

## 2016-07-30 MED ORDER — POLYETHYLENE GLYCOL 3350 17 G PO PACK: 17.0000 g | PACK | Freq: Every day | ORAL | Status: DC | PRN

## 2016-07-30 SURGICAL SUPPLY — 53 items
ADH SKN CLS APL DERMABOND .7 (GAUZE/BANDAGES/DRESSINGS) ×1
BAG DECANTER FOR FLEXI CONT (MISCELLANEOUS) ×3 IMPLANT
BLADE CLIPPER SURG (BLADE) IMPLANT
BUR ACORN 6.0 (BURR) ×1 IMPLANT
BUR ACORN 6.0MM (BURR) ×1
BUR MATCHSTICK NEURO 3.0 LAGG (BURR) ×3 IMPLANT
CANISTER SUCT 3000ML PPV (MISCELLANEOUS) ×3 IMPLANT
DECANTER SPIKE VIAL GLASS SM (MISCELLANEOUS) ×3 IMPLANT
DERMABOND ADVANCED (GAUZE/BANDAGES/DRESSINGS) ×2
DERMABOND ADVANCED .7 DNX12 (GAUZE/BANDAGES/DRESSINGS) ×1 IMPLANT
DEVICE COFLEX STABLIZATION 8MM (Neuro Prosthesis/Implant) ×2 IMPLANT
DEVICE DISSECT PLASMABLAD 3.0S (MISCELLANEOUS) ×1 IMPLANT
DRAPE C-ARM 42X72 X-RAY (DRAPES) ×6 IMPLANT
DRAPE HALF SHEET 40X57 (DRAPES) ×2 IMPLANT
DRAPE LAPAROTOMY T 102X78X121 (DRAPES) ×3 IMPLANT
DRAPE MICROSCOPE LEICA (MISCELLANEOUS) IMPLANT
DRAPE POUCH INSTRU U-SHP 10X18 (DRAPES) ×5 IMPLANT
DRSG OPSITE POSTOP 4X8 (GAUZE/BANDAGES/DRESSINGS) ×2 IMPLANT
DURAPREP 26ML APPLICATOR (WOUND CARE) ×3 IMPLANT
ELECT REM PT RETURN 9FT ADLT (ELECTROSURGICAL) ×3
ELECTRODE REM PT RTRN 9FT ADLT (ELECTROSURGICAL) ×1 IMPLANT
GAUZE SPONGE 4X4 12PLY STRL (GAUZE/BANDAGES/DRESSINGS) ×3 IMPLANT
GAUZE SPONGE 4X4 16PLY XRAY LF (GAUZE/BANDAGES/DRESSINGS) ×2 IMPLANT
GLOVE BIO SURGEON STRL SZ8 (GLOVE) ×2 IMPLANT
GLOVE BIO SURGEON STRL SZ8.5 (GLOVE) ×2 IMPLANT
GLOVE BIOGEL PI IND STRL 8.5 (GLOVE) ×1 IMPLANT
GLOVE BIOGEL PI INDICATOR 8.5 (GLOVE) ×2
GLOVE ECLIPSE 8.5 STRL (GLOVE) ×3 IMPLANT
GOWN STRL REUS W/ TWL LRG LVL3 (GOWN DISPOSABLE) IMPLANT
GOWN STRL REUS W/ TWL XL LVL3 (GOWN DISPOSABLE) IMPLANT
GOWN STRL REUS W/TWL 2XL LVL3 (GOWN DISPOSABLE) ×3 IMPLANT
GOWN STRL REUS W/TWL LRG LVL3 (GOWN DISPOSABLE)
GOWN STRL REUS W/TWL XL LVL3 (GOWN DISPOSABLE)
HEMOSTAT POWDER KIT SURGIFOAM (HEMOSTASIS) ×2 IMPLANT
KIT BASIN OR (CUSTOM PROCEDURE TRAY) ×3 IMPLANT
KIT ROOM TURNOVER OR (KITS) ×3 IMPLANT
NDL SPNL 20GX3.5 QUINCKE YW (NEEDLE) IMPLANT
NEEDLE HYPO 22GX1.5 SAFETY (NEEDLE) ×3 IMPLANT
NEEDLE SPNL 20GX3.5 QUINCKE YW (NEEDLE) IMPLANT
NS IRRIG 1000ML POUR BTL (IV SOLUTION) ×3 IMPLANT
PACK LAMINECTOMY NEURO (CUSTOM PROCEDURE TRAY) ×3 IMPLANT
PAD ARMBOARD 7.5X6 YLW CONV (MISCELLANEOUS) ×9 IMPLANT
PATTIES SURGICAL .5 X1 (DISPOSABLE) ×1 IMPLANT
PLASMABLADE 3.0S (MISCELLANEOUS) ×3
RUBBERBAND STERILE (MISCELLANEOUS) IMPLANT
SPONGE SURGIFOAM ABS GEL SZ50 (HEMOSTASIS) ×3 IMPLANT
SUT VIC AB 1 CT1 18XBRD ANBCTR (SUTURE) ×1 IMPLANT
SUT VIC AB 1 CT1 8-18 (SUTURE) ×3
SUT VIC AB 2-0 CP2 18 (SUTURE) ×3 IMPLANT
SUT VIC AB 3-0 SH 8-18 (SUTURE) ×5 IMPLANT
TOWEL OR 17X24 6PK STRL BLUE (TOWEL DISPOSABLE) ×3 IMPLANT
TOWEL OR 17X26 10 PK STRL BLUE (TOWEL DISPOSABLE) ×3 IMPLANT
WATER STERILE IRR 1000ML POUR (IV SOLUTION) ×3 IMPLANT

## 2016-07-30 NOTE — Anesthesia Postprocedure Evaluation (Signed)
Anesthesia Post Note  Patient: Louis [name].  Procedure(s) Performed: Procedure(s) (LRB): Lumbar three- four Lumbar four - five Laminectomy and foraminotomy (N/A)  Patient location during evaluation: PACU Anesthesia Type: General Level of consciousness: awake Pain management: pain level controlled Vital Signs Assessment: post-procedure vital signs reviewed and stable Respiratory status: spontaneous breathing Cardiovascular status: stable    Last Vitals:  Vitals:   07/30/16 1333 07/30/16 1417  BP: (!) 145/52 (!) 154/88  Pulse: 67 91  Resp: 16 16  Temp: 36.4 C 36.6 C    Last Pain:  Vitals:   07/30/16 1435  TempSrc:   PainSc: 6                  EDWARDS,Laticia Vannostrand

## 2016-07-30 NOTE — Anesthesia Procedure Notes (Signed)
Procedure Name: Intubation Date/Time: 07/30/2016 7:46 AM Performed by: Izora Gala Pre-anesthesia Checklist: Patient identified, Emergency Drugs available, Suction available and Patient being monitored Patient Re-evaluated:Patient Re-evaluated prior to inductionOxygen Delivery Method: Circle system utilized Preoxygenation: Pre-oxygenation with 100% oxygen Intubation Type: IV induction Ventilation: Mask ventilation without difficulty Laryngoscope Size: Miller and 3 Grade View: Grade II Tube type: Oral Tube size: 8.0 mm Number of attempts: 1 Airway Equipment and Method: Stylet,  Bite block and LTA kit utilized Placement Confirmation: ETT inserted through vocal cords under direct vision,  positive ETCO2 and breath sounds checked- equal and bilateral Secured at: 22 cm Dental Injury: Teeth and Oropharynx as per pre-operative assessment

## 2016-07-30 NOTE — Anesthesia Preprocedure Evaluation (Addendum)
Anesthesia Evaluation  Patient identified by MRN, date of birth, ID band Patient awake    Reviewed: Allergy & Precautions, NPO status , Patient's Chart, lab work & pertinent test results  Airway Mallampati: II       Dental   Pulmonary former smoker,    breath sounds clear to auscultation       Cardiovascular hypertension,  Rhythm:Regular Rate:Normal     Neuro/Psych    GI/Hepatic Neg liver ROS, GERD  ,  Endo/Other  diabetesHypothyroidism   Renal/GU Renal disease     Musculoskeletal   Abdominal   Peds  Hematology   Anesthesia Other Findings   Reproductive/Obstetrics                             Anesthesia Physical Anesthesia Plan  ASA: III  Anesthesia Plan: General   Post-op Pain Management:    Induction: Intravenous  Airway Management Planned: Oral ETT  Additional Equipment:   Intra-op Plan:   Post-operative Plan: Extubation in OR  Informed Consent:   Dental advisory given  Plan Discussed with: CRNA and Anesthesiologist  Anesthesia Plan Comments:         Anesthesia Quick Evaluation

## 2016-07-30 NOTE — Op Note (Signed)
Date of surgery: 07/30/2016 Preoperative diagnosis: Spondylosis and stenosis L3-4 L4-5 with radiculopathy Postoperative diagnosis: Same Procedure performed: Bilateral laminotomies and foraminotomies L3-4 and L4-5. Surgeon: Kristeen Miss M.D. First assistant: Newman Pies M.D. Anesthesia: Gen. endotracheal Indications: Mr. Louis [name] is a 80 year old individual who has bilateral stenosis at L3-4 and L4-5 he has evidence of a small central disc herniation at L4-L5 but he has lateral recess stenosis at each of the levels involving mostly the L4 and the L5 nerve roots. He's failed efforts at conservative management was advised regarding surgery to decompress and place Coflex stabilization at L3-4 and L4-5. This is what was initially planned however during the performance of the surgery when we attempted to place a Coflex device at L4-L5 a fracture at the base of the spinous process of L4 occurred which were rendered the Coflex not able to be placed at L3-4 or L4-5. Therefore Coflex placement was abandoned at that point.  Procedure: The patient was brought to the operating room supine on a stretcher. After the smooth induction of general endotracheal anesthesia, he was turned prone. The back was prepped with alcohol DuraPrep. The back was then draped sterilely. A midline incision was created in the lower lumbar spine and this was carried down to the lumbar dorsal fascia. Fascia was opened on either side of midline to expose the first interspinous space which is noted to be that of L4-5. The dissection was then carried down to expose L3-4 and L4-5. A self-retaining retractor was placed in the wound. Then at L4-5 on the left side laminotomy was performed removing the inferior margin lamina of L4 out to the medial wall the facet. A partial mesial facetectomy was performed. Here in the lateral recess the L5 nerve root was decompressed. Significant quantities of severely degenerated and desiccated ligamentous  material were removed that allowed passage of the L5 nerve root across the disc space and into the foramen. Once this was accomplished hemostasis was established and attention was turned to L3-4 left side. Here similar laminotomy and foraminotomy was completed decompressing both the central canal and the L4 nerve root. The L3 nerve root superiorly was decompressed per removing ligamentous material and the superior most aspect of the superior facet process at L4. Hemostasis was then achieved here. Attention was then turned to the right side were laminotomies and foraminotomies were carried out in a similar fashion. Then the interspinous spaces were explored and the ligament was taken down the interspinous space at L4-5 and L3-4. A sizer was used to size a Coflex device at L4-5 and after several trials is felt that an 8 mm Coflex would fit best into this interval as the Coflex was being applied to the interspace at L4-5-1 sudden given the spinous process was observed and there was observed to be a crack at the base of the spinous process at the junction with the base of the lamina. The Coflex was removed at this point is felt that because of this fracture the integrity of the L4 spinous process was severely compromised. Is not felt that a Coflex device in either L4-5 or L3-4 be placed. At this point placement of Coflex was abandoned. The area was inspected carefully and the common dural tube and the L4 and L5 nerve roots were well decompressed. Hemostasis was then checked doubly and once verified the lumbar dorsal fascia was reapproximated with #1 Vicryl in interrupted fashion. 25 mL of half percent Marcaine was injected into the paraspinous fascia. The subcutaneous tissue  was closed with 2-0 Vicryl and 3-0 Vicryl was used in the subcuticular skin. Dermabond was placed in the skin. Blood loss for the entire procedure was estimated approximately 300 mL.

## 2016-07-30 NOTE — Progress Notes (Signed)
Patient ID: Louis [name]., male   DOB: Feb 21, 1936, 80 y.o.   MRN: [id number] Vital signs are stable Moderate back soreness Incision is clean and dry Motor function is good in lower extremities Stable

## 2016-07-30 NOTE — Transfer of Care (Signed)
Immediate Anesthesia Transfer of Care Note  Patient: Louis [name].  Procedure(s) Performed: Procedure(s) with comments: Lumbar three- four Lumbar four - five Laminectomy and foraminotomy (N/A) - L3-4 L4-5 Laminectomy with coflex  Patient Location: PACU  Anesthesia Type:General  Level of Consciousness: awake, alert  and patient cooperative  Airway & Oxygen Therapy: Patient connected to nasal cannula oxygen  Post-op Assessment: Report given to RN, Post -op Vital signs reviewed and stable, Patient moving all extremities and Patient moving all extremities X 4  Post vital signs: Reviewed and stable  Last Vitals:  Vitals:   07/30/16 0608  BP: (!) 106/50  Pulse: (!) 57  Resp: 20  Temp: 36.7 C    Last Pain:  Vitals:   07/30/16 0641  TempSrc:   PainSc: 4       Patients Stated Pain Goal: 4 ([id number])  Complications: No apparent anesthesia complications

## 2016-07-30 NOTE — H&P (Signed)
CHIEF COMPLAINT:                              Back pain with radiation into the buttocks and legs, not relieved with nerve blocks, that has become progressively more severe.                                              HOPI:                                                   Louis [name] is a 80 year old, right-handed individual whom I had seen in the past.  Apparently years ago I told him that he had some spinal stenosis and I advised conservative management of this process, which he has done over the years.  He has been seen and followed by Dr. Suella Broad, and has a number of epidural steroid injections which have given him good and reasonable relief of back pain with sciatica.  He notes that the last two injections however, have become substantially less effective, and more recently, he had an MRI of his back a few months ago.  This recapitulates that he has now progressed from spondylitic stenosis at L4-5, and he has stenosis at L3-4 to a lesser degree.  Plain x-rays have not been performed recently, and today in the office I obtained an AP and lateral radiograph of the lumbar spine with flexion and extension views.  REVIEW OF SYSTEMS:                        His systems review is notable for hearing loss, abdominal pain, leg pain while walking, high cholesterol, arthritis, back pain, leg pain, thyroid disease, some food allergies, and some mild anxiety on a 14 point review sheet.  PAST MEDICAL HISTORY:                    On his past medical history, I note that his general health has been good.  CURRENT MEDICATIONS:                     His chronic medications have been reviewed.       DATA:                                                  Three views of the spine are viewed, and these demonstrate that there is a grade 1 anterolisthesis at L3 on 4 with significant stenosis at that level, in addition to the stenosis at L4-5, which does not show any translational motion between flexion and  extension.  The alignment in the coronal plane is quite normal.  I demonstrated the findings to Mr. Okubo and his wife, and I noted that the problem appears to be his stenosis at L3-4 and L4-5.                  PHYSICAL EXAMINATION:  His physical exam reveals that he can stand straight and erect, and he walks without antalgia.  Motor strength is good in the major groups including the iliopsoas, quads, tibialis anterior, and the gastrocs.  Straight leg raising is negative at 45 degrees bilaterally.  Patrick's maneuver is negative bilaterally also.  Sensation appears grossly intact.  His reflexes are symmetrically depressed.              IMPRESSION/PLAN:                             The patient has evidence of significant spondylitic stenosis at L3-4 and L4-5 as noted on his MRI.  Plain x-rays suggest that there is a grade 1 anterolisthesis at L3-4.  I believe the patient would be well served with a decompression of the central portion of the canal at L3-4 and L4-5 via bilateral laminotomies and foraminotomies.  I believe that he would gain some improvement in overall functional status if Coflex stabilization was performed in the intraspinous spaces at L3-4 and L4-5.  I discussed the rationale for the use of this device with him and his wife, and explained to him the nature of device by expanding the intraspinous space helps to preserve and protect that decompression of the central canal.  The patient has had extensive treatment conservatively over the years by Dr. Nelva Bush, and he has done well with that.  However, now he is developing increasingly severe stenosis with both symptoms of neurogenic claudication and lumbar radiculopathy. He is admitted for surgery today.

## 2016-07-31 ENCOUNTER — Encounter (HOSPITAL_COMMUNITY): Payer: Self-pay | Admitting: Neurological Surgery

## 2016-07-31 LAB — GLUCOSE, CAPILLARY: GLUCOSE-CAPILLARY: 117 mg/dL — AB (ref 65–99)

## 2016-07-31 MED ORDER — OXYCODONE-ACETAMINOPHEN 5-325 MG PO TABS: 1.0000 | ORAL_TABLET | ORAL | 0 refills | Status: DC | PRN

## 2016-07-31 MED ORDER — METHOCARBAMOL 500 MG PO TABS: 500.0000 mg | ORAL_TABLET | Freq: Four times a day (QID) | ORAL | 3 refills | Status: DC | PRN

## 2016-07-31 NOTE — Evaluation (Signed)
Occupational Therapy Evaluation Patient Details Name: Louis [name]. MRN: [id number] DOB: [birth year] Today's Date: 07/31/2016    History of Present Illness 80 yo male s/p L3-4-5 Lamienctomy and formaminotomy PMH:                              Clinical Impression  Past Medical History:  Diagnosis Date  . Anemia   . Arthritis    Dr Amil Amen, [place] rx- Rheumatology  . Diabetes mellitus   . Diverticulosis   . GERD (gastroesophageal reflux disease)   . Hemochromatosis     Dr Henrene Pastor  . History of kidney stones 1980   passed spont.  Marland Kitchen Hx of adenomatous colonic polyps   . Hyperlipidemia   . Hypertension   . Hypothyroidism   . IBS (irritable bowel syndrome)   . Internal hemorrhoids   . Kidney stones, calcium oxalate    1985 & 2005  . Pulmonary embolism ([place]) 2015   hosp. with Heparin & then on Xarelto- 3 months or so  . Reflux esophagitis   . SBO (small bowel obstruction) 2004  . Spinal stenosis    Dr.Ramos  . Testosterone deficiency    111 in 2007    Patient evaluated by Occupational Therapy with no further acute OT needs identified. All education has been completed and the patient has no further questions. See below for any follow-up Occupational Therapy or equipment needs. OT to sign off. Thank you for referral.      Follow Up Recommendations  No OT follow up    Equipment Recommendations  None recommended by OT    Recommendations for Other Services       Precautions / Restrictions Precautions Precautions: Back Precaution Comments: back brace  Required Braces or Orthoses: Spinal Brace Spinal Brace: Applied in sitting position;Lumbar corset      Mobility Bed Mobility Overal bed mobility: Modified Independent                Transfers Overall transfer level: Modified independent                    Balance                                            ADL Overall ADL's : Needs  assistance/impaired Eating/Feeding: Modified independent   Grooming: Wash/dry hands;Modified independent               Lower Body Dressing: Supervision/safety;Sit to/from stand Lower Body Dressing Details (indicate cue type and reason): use of reacher for LB dressing  Toilet Transfer: Supervision/safety           Functional mobility during ADLs: Supervision/safety   Back handout provided and reviewed adls in detail. Pt educated on: clothing between brace, never sleep in brace, set an alarm at night for medication, avoid sitting for long periods of time, correct bed positioning for sleeping, correct sequence for bed mobility, avoiding lifting more than 5 pounds and never wash directly over incision. All education is complete and patient indicates understanding.       Vision Vision Assessment?: No apparent visual deficits   Perception     Praxis      Pertinent Vitals/Pain Pain Assessment: No/denies pain     Hand Dominance Right   Extremity/Trunk Assessment Upper Extremity Assessment Upper Extremity  Assessment: Overall WFL for tasks assessed   Lower Extremity Assessment Lower Extremity Assessment: Defer to PT evaluation   Cervical / Trunk Assessment Cervical / Trunk Assessment: Other exceptions (s/p surg)   Communication Communication Communication: No difficulties   Cognition Arousal/Alertness: Awake/alert Behavior During Therapy: WFL for tasks assessed/performed Overall Cognitive Status: Within Functional Limits for tasks assessed                     General Comments       Exercises       Shoulder Instructions      Home Living Family/patient expects to be discharged to:: Private residence Living Arrangements: Spouse/significant other Available Help at Discharge: Family Type of Home: House Home Access: Stairs to enter Technical brewer of Steps: 2 Entrance Stairs-Rails: Right Home Layout: Able to live on main level with  bedroom/bathroom;Two level     Bathroom Shower/Tub: Teacher, early years/pre: Handicapped height     Home Equipment: Environmental consultant - 2 wheels;Cane - single point;Adaptive equipment Adaptive Equipment: Reacher        Prior Functioning/Environment Level of Independence: Independent with assistive device(s)                 OT Problem List:     OT Treatment/Interventions:      OT Goals(Current goals can be found in the care plan section)    OT Frequency:     Barriers to D/C:            Co-evaluation              End of Session Equipment Utilized During Treatment: Back brace Nurse Communication: Mobility status;Precautions  Activity Tolerance: Patient tolerated treatment well Patient left: in chair;with call bell/phone within reach   Time: [id number] OT Time Calculation (min): 28 min Charges:  OT General Charges $OT Visit: 1 Procedure OT Evaluation $OT Eval Moderate Complexity: 1 Procedure G-Codes:    Parke Poisson B 08-29-16, 10:06 AM  Jeri Modena   OTR/L [phone] Office: [phone] .

## 2016-07-31 NOTE — Discharge Summary (Signed)
Physician Discharge Summary  Patient ID: Louis [name]. MRN: [id number] DOB/AGE: 78-26-1937 78 y.o.  Admit date: 07/30/2016 Discharge date: 07/31/2016  Admission Diagnoses:Lumbar stenosis L3-4 and L4-5 with left lumbar radiculopathy and neurogenic claudication  Discharge Diagnoses: Lumbar stenosis L3-4 and L4-5 with left lumbar radiculopathy and neurogenic claudication Active Problems:   Lumbar stenosis with neurogenic claudication   Discharged Condition: good  Hospital Course: Patient was admitted to undergo surgical decompression L3-4 and L4-5 with Lasix but of Coflex. During the time of surgery there was a crack in the lamina of L4 was felt that it was not safe to place the Coflex device. He underwent bilateral laminotomies without placement of Coflex and tolerated this well. He's had good relief of his left lumbar radicular pain.  Consults: None  Significant Diagnostic Studies: None  Treatments: surgery: Bilateral laminotomies and foraminotomies L3-4 and L4-5  Discharge Exam: Blood pressure (!) 112/56, pulse (!) 56, temperature 98.2 F (36.8 C), resp. rate 16, height 6' (1.829 m), weight 101.2 kg (223 lb), SpO2 100 %. Incision is clean and dry motor function is intact station and gait are intact.  Disposition: 01-Home or Self Care  Discharge Instructions    Call MD for:  redness, tenderness, or signs of infection (pain, swelling, redness, odor or green/yellow discharge around incision site)    Complete by:  As directed    Call MD for:  severe uncontrolled pain    Complete by:  As directed    Call MD for:  temperature >100.4    Complete by:  As directed    Diet - low sodium heart healthy    Complete by:  As directed    Discharge instructions    Complete by:  As directed    Okay to shower. Do not apply salves or appointments to incision. No heavy lifting with the upper extremities greater than 15 pounds. May resume driving when not requiring pain medication and  patient feels comfortable with doing so.   Increase activity slowly    Complete by:  As directed        Medication List    TAKE these medications   ALPRAZolam 0.5 MG tablet Commonly known as:  XANAX TAKE 1/2 TABLET BY MOUTH EVERY 8-12 HOURS AS NEEDED   amoxicillin 500 MG tablet Commonly known as:  AMOXIL Take 500 mg by mouth daily as needed (dental procedure). Reported on 10/27/2015   aspirin EC 81 MG tablet Take 81 mg by mouth at bedtime.   atorvastatin 20 MG tablet Commonly known as:  LIPITOR Take 1 tablet (20 mg total) by mouth daily at 6 PM.   CALCIUM-VITAMIN D PO Take 1 tablet by mouth daily.   diphenhydrAMINE 25 mg capsule Commonly known as:  BENADRYL Take 25 mg by mouth at bedtime as needed for allergies (allergies). Reported on 0000000   folic acid 1 MG tablet Commonly known as:  FOLVITE Take 1 mg by mouth daily.   freestyle lancets Test blood sugar daily. Dx code: 250.00   glucose blood test strip Check blood sugar once daily, DX:250.00   HYDROcodone-acetaminophen 10-325 MG tablet Commonly known as:  NORCO Take 1 tablet by mouth every 4 (four) hours as needed for moderate pain or severe pain (pain).   levothyroxine 125 MCG tablet Commonly known as:  SYNTHROID, LEVOTHROID TAKE 1 TABLET(125 MCG) BY MOUTH DAILY   lisinopril 20 MG tablet Commonly known as:  PRINIVIL,ZESTRIL Take 1 tablet (20 mg total) by mouth daily. What changed:  how  much to take  when to take this   methocarbamol 500 MG tablet Commonly known as:  ROBAXIN Take 1 tablet (500 mg total) by mouth every 6 (six) hours as needed for muscle spasms.   methotrexate 2.5 MG tablet Commonly known as:  RHEUMATREX Take 15 mg by mouth once a week. Monday Night.   multivitamin per tablet Take 1 tablet by mouth daily.   omeprazole 20 MG tablet Commonly known as:  PRILOSEC OTC Take 20 mg by mouth daily.   ondansetron 4 MG tablet Commonly known as:  ZOFRAN TAKE 1 TABLET EVERY 6 HOURS AS  NEEDED FOR NAUSEA OR VOMITING   oxyCODONE-acetaminophen 5-325 MG tablet Commonly known as:  PERCOCET/ROXICET Take 1-2 tablets by mouth every 4 (four) hours as needed for moderate pain.   pioglitazone 15 MG tablet Commonly known as:  ACTOS Take 1 tablet (15 mg total) by mouth daily.   vitamin B-12 1000 MCG tablet Commonly known as:  CYANOCOBALAMIN Take 1,000 mcg by mouth daily.        SignedEarleen Newport 07/31/2016, 11:13 AM

## 2016-07-31 NOTE — Evaluation (Signed)
Physical Therapy Evaluation Patient Details Name: Louis [name]. MRN: [id number] DOB: Jun 22, 1936 Today's Date: 07/31/2016   History of Present Illness  80 yo male s/p L3-4-5 Lamienctomy and formaminotomy   Clinical Impression  Patient evaluated by Physical Therapy with no further acute PT needs identified. All education has been completed and the patient has no further questions. At the time of PT eval pt was able to perform transfers and ambulation at a modified independent level. Pt required cues for maintenance of precautions and general safety. See below for any follow-up Physical Therapy or equipment needs. PT is signing off. Thank you for this referral.     Follow Up Recommendations Supervision for mobility/OOB;Outpatient PT    Equipment Recommendations  None recommended by PT    Recommendations for Other Services       Precautions / Restrictions Precautions Precautions: Back Precaution Booklet Issued: Yes (comment) Precaution Comments: back brace  Required Braces or Orthoses: Spinal Brace Spinal Brace: Applied in sitting position;Lumbar corset      Mobility  Bed Mobility Overal bed mobility: Modified Independent             General bed mobility comments: VC's for proper log roll technique with and without railing.   Transfers Overall transfer level: Modified independent                  Ambulation/Gait Ambulation/Gait assistance: Modified independent (Device/Increase time) Ambulation Distance (Feet): 400 Feet Assistive device: None Gait Pattern/deviations: Step-through pattern;Decreased stride length;Trunk flexed Gait velocity: Decreased Gait velocity interpretation: Below normal speed for age/gender General Gait Details: VC's for improved posture. Slightly slowed but generally steady gait.   Stairs Stairs: Yes Stairs assistance: Min guard Stair Management: One rail Right;Step to pattern;Forwards Number of Stairs: 5 General stair comments:  VC's for sequencing and general safety awareness.   Wheelchair Mobility    Modified Rankin (Stroke Patients Only)       Balance Overall balance assessment: No apparent balance deficits (not formally assessed)                                           Pertinent Vitals/Pain Pain Assessment: No/denies pain    Home Living Family/patient expects to be discharged to:: Private residence Living Arrangements: Spouse/significant other Available Help at Discharge: Family Type of Home: House Home Access: Stairs to enter Entrance Stairs-Rails: Right Entrance Stairs-Number of Steps: 2 Home Layout: Able to live on main level with bedroom/bathroom;Two level Home Equipment: Walker - 2 wheels;Cane - single point;Adaptive equipment      Prior Function Level of Independence: Independent with assistive device(s)               Hand Dominance   Dominant Hand: Right    Extremity/Trunk Assessment   Upper Extremity Assessment: Overall WFL for tasks assessed           Lower Extremity Assessment: Overall WFL for tasks assessed      Cervical / Trunk Assessment: Other exceptions (s/p surg)  Communication   Communication: No difficulties  Cognition Arousal/Alertness: Awake/alert Behavior During Therapy: WFL for tasks assessed/performed Overall Cognitive Status: Within Functional Limits for tasks assessed                      General Comments      Exercises     Assessment/Plan    PT Assessment Patent does  not need any further PT services  PT Problem List            PT Treatment Interventions      PT Goals (Current goals can be found in the Care Plan section)  Acute Rehab PT Goals Patient Stated Goal: Home today PT Goal Formulation: All assessment and education complete, DC therapy    Frequency     Barriers to discharge        Co-evaluation               End of Session Equipment Utilized During Treatment: Gait belt;Back  brace Activity Tolerance: Patient tolerated treatment well Patient left: in bed;with call bell/phone within reach (Sitting EOB) Nurse Communication: Mobility status         Time: [id number] PT Time Calculation (min) (ACUTE ONLY): 24 min   Charges:   PT Evaluation $PT Eval Moderate Complexity: 1 Procedure PT Treatments $Gait Training: 8-22 mins   PT G Codes:        Rolinda Roan 2016/08/28, 11:31 AM  Rolinda Roan, PT, DPT Acute Rehabilitation Services Pager: [phone]

## 2016-07-31 NOTE — Progress Notes (Signed)
Patient alert and oriented, mae's well, voiding adequate amount of urine, swallowing without difficulty, no c/o pain at time of discharge. Patient discharged home with family. Script and discharged instructions given to patient. Patient and family stated understanding of instructions given. Patient has an appointment with Dr. Ellene Route in 3 week.

## 2016-08-16 DIAGNOSIS — M48061 Spinal stenosis, lumbar region without neurogenic claudication: Secondary | ICD-10-CM | POA: Diagnosis not present

## 2016-08-28 ENCOUNTER — Other Ambulatory Visit: Payer: Self-pay | Admitting: Family Medicine

## 2016-08-28 DIAGNOSIS — F411 Generalized anxiety disorder: Secondary | ICD-10-CM

## 2016-08-28 NOTE — Telephone Encounter (Signed)
Louis [name]-- please call pt to schedule follow up with Dr Carollee Herter soon. He was due for follow up in September nad is past due. Thank you!  Last OV: 12/02/15 Next OV:  Past due. Pt was due for 6 month follow up in September UDS:  uds and csc 2014  Rx printed and forwarded to PCP for signature.

## 2016-08-30 NOTE — Telephone Encounter (Signed)
Rx faxed to pharmacy [facility]. LB

## 2016-09-03 NOTE — Telephone Encounter (Signed)
Patient scheduled.

## 2016-09-07 DIAGNOSIS — H903 Sensorineural hearing loss, bilateral: Secondary | ICD-10-CM | POA: Diagnosis not present

## 2016-09-13 DIAGNOSIS — Z79891 Long term (current) use of opiate analgesic: Secondary | ICD-10-CM | POA: Diagnosis not present

## 2016-09-13 DIAGNOSIS — M5136 Other intervertebral disc degeneration, lumbar region: Secondary | ICD-10-CM | POA: Diagnosis not present

## 2016-09-13 DIAGNOSIS — M4807 Spinal stenosis, lumbosacral region: Secondary | ICD-10-CM | POA: Diagnosis not present

## 2016-09-13 DIAGNOSIS — G894 Chronic pain syndrome: Secondary | ICD-10-CM | POA: Diagnosis not present

## 2016-10-09 ENCOUNTER — Telehealth: Payer: Self-pay | Admitting: *Deleted

## 2016-10-09 NOTE — Telephone Encounter (Signed)
Called patient and left message to return call

## 2016-10-11 ENCOUNTER — Encounter: Payer: Self-pay | Admitting: Family Medicine

## 2016-10-11 ENCOUNTER — Ambulatory Visit (INDEPENDENT_AMBULATORY_CARE_PROVIDER_SITE_OTHER): Payer: Medicare Other | Admitting: Family Medicine

## 2016-10-11 VITALS — BP 122/66 | HR 62 | Temp 98.4°F | Resp 16 | Ht 72.0 in | Wt 230.0 lb

## 2016-10-11 DIAGNOSIS — F411 Generalized anxiety disorder: Secondary | ICD-10-CM

## 2016-10-11 DIAGNOSIS — E1151 Type 2 diabetes mellitus with diabetic peripheral angiopathy without gangrene: Secondary | ICD-10-CM | POA: Diagnosis not present

## 2016-10-11 DIAGNOSIS — E785 Hyperlipidemia, unspecified: Secondary | ICD-10-CM | POA: Diagnosis not present

## 2016-10-11 DIAGNOSIS — R351 Nocturia: Secondary | ICD-10-CM

## 2016-10-11 DIAGNOSIS — I10 Essential (primary) hypertension: Secondary | ICD-10-CM

## 2016-10-11 DIAGNOSIS — R350 Frequency of micturition: Secondary | ICD-10-CM

## 2016-10-11 DIAGNOSIS — I159 Secondary hypertension, unspecified: Secondary | ICD-10-CM

## 2016-10-11 DIAGNOSIS — IMO0002 Reserved for concepts with insufficient information to code with codable children: Secondary | ICD-10-CM

## 2016-10-11 DIAGNOSIS — E1165 Type 2 diabetes mellitus with hyperglycemia: Secondary | ICD-10-CM

## 2016-10-11 DIAGNOSIS — E039 Hypothyroidism, unspecified: Secondary | ICD-10-CM

## 2016-10-11 MED ORDER — GLUCOSE BLOOD VI STRP: ORAL_STRIP | 1 refills | Status: DC

## 2016-10-11 MED ORDER — ALPRAZOLAM 0.5 MG PO TABS: ORAL_TABLET | ORAL | 3 refills | Status: DC

## 2016-10-11 MED ORDER — LANCETS MISC: 1 refills | Status: DC

## 2016-10-11 NOTE — Progress Notes (Signed)
Patient ID: Louis [name]., male    DOB: [birth year]  Age: 81 y.o. MRN: [id number]    Subjective:  Subjective  HPI Louis [name]. presents for f/u dm, cholesterol and htn.    Review of Systems  Constitutional: Negative for appetite change, diaphoresis, fatigue and unexpected weight change.  Eyes: Negative for pain, redness and visual disturbance.  Respiratory: Negative for cough, chest tightness, shortness of breath and wheezing.   Cardiovascular: Negative for chest pain, palpitations and leg swelling.  Endocrine: Negative for cold intolerance, heat intolerance, polydipsia, polyphagia and polyuria.  Genitourinary: Negative for difficulty urinating, dysuria and frequency.  Neurological: Negative for dizziness, light-headedness, numbness and headaches.    History Past Medical History:  Diagnosis Date  . Anemia   . Arthritis    Dr Amil Amen, [facility] rx- Rheumatology  . Diabetes mellitus   . Diverticulosis   . GERD (gastroesophageal reflux disease)   . Hemochromatosis     Dr Henrene Pastor  . History of kidney stones 1980   passed spont.  Marland Kitchen Hx of adenomatous colonic polyps   . Hyperlipidemia   . Hypertension   . Hypothyroidism   . IBS (irritable bowel syndrome)   . Internal hemorrhoids   . Kidney stones, calcium oxalate    1985 & 2005  . Pulmonary embolism ([place]) 2015   hosp. with Heparin & then on Xarelto- 3 months or so  . Reflux esophagitis   . SBO (small bowel obstruction) 2004  . Spinal stenosis    Dr.Ramos  . Testosterone deficiency    111 in 2007    He has a past surgical history that includes Appendectomy; Shoulder surgery; Colonoscopy w/ polypectomy (2005); lumbar nerve block; Total shoulder replacement; Total knee arthroplasty ( 201 & 2012); Refractive surgery (2005); Tonsillectomy and adenoidectomy; cataract (2005); TM replacement (1981); Middle ear surgery (1980); L4-5 & L5- S1 bilat facet injections (01/20/14); Joint replacement; and Lumbar laminectomy with coflex 2  level (N/A, 07/30/2016).   His family history includes Breast cancer in his maternal grandmother; Depression in his brother; Diabetes in his father and maternal aunt; Heart disease in his paternal aunt and paternal uncle; Heart failure in his mother; Stroke (age of onset: 39) in his father.He reports that he quit smoking about 36 years ago. He has never used smokeless tobacco. He reports that he drinks alcohol. He reports that he does not use drugs.  Current Outpatient Prescriptions on File Prior to Visit  Medication Sig Dispense Refill  . amoxicillin (AMOXIL) 500 MG tablet Take 500 mg by mouth daily as needed (dental procedure). Reported on 10/27/2015    . aspirin EC 81 MG tablet Take 81 mg by mouth at bedtime.    Marland Kitchen atorvastatin (LIPITOR) 20 MG tablet Take 1 tablet (20 mg total) by mouth daily at 6 PM. 90 tablet 3  . CALCIUM-VITAMIN D PO Take 1 tablet by mouth daily.     . diphenhydrAMINE (BENADRYL) 25 mg capsule Take 25 mg by mouth at bedtime as needed for allergies (allergies). Reported on 0000000    . folic acid (FOLVITE) 1 MG tablet Take 1 mg by mouth daily.      Marland Kitchen HYDROcodone-acetaminophen (NORCO) 10-325 MG per tablet Take 1 tablet by mouth every 4 (four) hours as needed for moderate pain or severe pain (pain).     Marland Kitchen levothyroxine (SYNTHROID, LEVOTHROID) 125 MCG tablet TAKE 1 TABLET(125 MCG) BY MOUTH DAILY 90 tablet 1  . lisinopril (PRINIVIL,ZESTRIL) 20 MG tablet Take 1 tablet (20 mg  total) by mouth daily. (Patient taking differently: Take 10 mg by mouth at bedtime. ) 90 tablet 1  . methocarbamol (ROBAXIN) 500 MG tablet Take 1 tablet (500 mg total) by mouth every 6 (six) hours as needed for muscle spasms. 40 tablet 3  . methotrexate (RHEUMATREX) 2.5 MG tablet Take 15 mg by mouth once a week. Monday Night.    . multivitamin (THERAGRAN) per tablet Take 1 tablet by mouth daily.      Marland Kitchen omeprazole (PRILOSEC OTC) 20 MG tablet Take 20 mg by mouth daily.    . ondansetron (ZOFRAN) 4 MG tablet TAKE 1  TABLET EVERY 6 HOURS AS NEEDED FOR NAUSEA OR VOMITING 40 tablet 1  . oxyCODONE-acetaminophen (PERCOCET/ROXICET) 5-325 MG tablet Take 1-2 tablets by mouth every 4 (four) hours as needed for moderate pain. 60 tablet 0  . pioglitazone (ACTOS) 15 MG tablet Take 1 tablet (15 mg total) by mouth daily. 90 tablet 3  . vitamin B-12 (CYANOCOBALAMIN) 1000 MCG tablet Take 1,000 mcg by mouth daily.     No current facility-administered medications on file prior to visit.      Objective:  Objective  Physical Exam  Constitutional: He is oriented to person, place, and time. Vital signs are normal. He appears well-developed and well-nourished. He is sleeping.  HENT:  Head: Normocephalic and atraumatic.  Mouth/Throat: Oropharynx is clear and moist.  Eyes: EOM are normal. Pupils are equal, round, and reactive to light.  Neck: Normal range of motion. Neck supple. No thyromegaly present.  Cardiovascular: Normal rate and regular rhythm.   No murmur heard. Pulmonary/Chest: Effort normal and breath sounds normal. No respiratory distress. He has no wheezes. He has no rales. He exhibits no tenderness.  Musculoskeletal: He exhibits no edema or tenderness.  Neurological: He is alert and oriented to person, place, and time.  Skin: Skin is warm and dry.  Psychiatric: He has a normal mood and affect. His behavior is normal. Judgment and thought content normal.  Nursing note and vitals reviewed.  BP 122/66 (BP Location: Right Arm, Cuff Size: Large)   Pulse 62   Temp 98.4 F (36.9 C) (Oral)   Resp 16   Ht 6' (1.829 m)   Wt 230 lb (104.3 kg)   SpO2 97%   BMI 31.19 kg/m  Wt Readings from Last 3 Encounters:  10/11/16 230 lb (104.3 kg)  07/30/16 223 lb (101.2 kg)  07/19/16 223 lb 12.8 oz (101.5 kg)     Lab Results  Component Value Date   WBC 5.3 10/11/2016   HGB 12.2 (L) 10/11/2016   HCT 36.4 (L) 10/11/2016   PLT 298.0 10/11/2016   GLUCOSE 109 (H) 10/11/2016   CHOL 170 10/11/2016   TRIG 169.0 (H)  10/11/2016   HDL 40.70 10/11/2016   LDLDIRECT 120.0 12/02/2015   LDLCALC 96 10/11/2016   ALT 14 10/11/2016   AST 17 10/11/2016   NA 141 10/11/2016   K 4.6 10/11/2016   CL 106 10/11/2016   CREATININE 1.38 10/11/2016   BUN 21 10/11/2016   CO2 29 10/11/2016   TSH 0.51 10/11/2016   PSA 1.67 10/11/2016   INR 1.00 08/17/2014   HGBA1C 6.7 (H) 10/11/2016   MICROALBUR 3.0 (H) 12/08/2014    No results found.   Assessment & Plan:  Plan  I have discontinued Mr. Romack glucose blood and freestyle. I have also changed his Lancets and glucose blood. Additionally, I am having him maintain his CALCIUM-VITAMIN D PO, folic acid, HYDROcodone-acetaminophen, diphenhydrAMINE, methotrexate, multivitamin,  omeprazole, vitamin B-12, amoxicillin, atorvastatin, pioglitazone, ondansetron, lisinopril, levothyroxine, aspirin EC, methocarbamol, oxyCODONE-acetaminophen, and ALPRAZolam.  Meds ordered this encounter  Medications  . DISCONTD: glucose blood (ONETOUCH VERIO) test strip    Sig: 1 each by Other route daily. Use as instructed  . DISCONTD: Lancets MISC    Sig: 1 each by Does not apply route daily.  . Lancets MISC    Sig: Use as instructed once daily.  DX code: E11.59    Dispense:  100 each    Refill:  1  . glucose blood (ONETOUCH VERIO) test strip    Sig: Use as instructed once daily. DX code: E11.59    Dispense:  100 each    Refill:  1  . ALPRAZolam (XANAX) 0.5 MG tablet    Sig: TAKE ONE-HALF BY MOUTH EVERY 8 TO 12 HOURS AS NEEDED    Dispense:  30 tablet    Refill:  3    Problem List Items Addressed This Visit      Unprioritized   Anxiety state   Relevant Medications   ALPRAZolam (XANAX) 0.5 MG tablet   Other Relevant Orders   TSH (Completed)   Hypothyroidism   Relevant Orders   CBC (Completed)    Other Visit Diagnoses    DM (diabetes mellitus) type II uncontrolled, periph vascular disorder ([place])    -  Primary   Relevant Medications   Lancets MISC   glucose blood (ONETOUCH  VERIO) test strip   Other Relevant Orders   Hemoglobin A1c (Completed)   Secondary hypertension       Relevant Orders   CBC (Completed)   Hyperlipidemia LDL goal <100       Relevant Orders   Lipid panel (Completed)   CBC (Completed)   Essential hypertension       Relevant Orders   Comprehensive metabolic panel (Completed)   CBC (Completed)   Urinary frequency       Nocturia       Relevant Orders   PSA, Medicare (Completed)      Follow-up: Return in about 6 months (around 04/10/2017) for annual exam, fasting.  Ann Held, DO

## 2016-10-11 NOTE — Patient Instructions (Signed)
Carbohydrate Counting for Diabetes Mellitus, Adult Carbohydrate counting is a method for keeping track of how many carbohydrates you eat. Eating carbohydrates naturally increases the amount of sugar (glucose) in the blood. Counting how many carbohydrates you eat helps keep your blood glucose within normal limits, which helps you manage your diabetes (diabetes mellitus). It is important to know how many carbohydrates you can safely have in each meal. This is different for every person. A diet and nutrition specialist (registered dietitian) can help you make a meal plan and calculate how many carbohydrates you should have at each meal and snack. Carbohydrates are found in the following foods:  Grains, such as breads and cereals.  Dried beans and soy products.  Starchy vegetables, such as potatoes, peas, and corn.  Fruit and fruit juices.  Milk and yogurt.  Sweets and snack foods, such as cake, cookies, candy, chips, and soft drinks. How do I count carbohydrates? There are two ways to count carbohydrates in food. You can use either of the methods or a combination of both. Reading "Nutrition Facts" on packaged food  The "Nutrition Facts" list is included on the labels of almost all packaged foods and beverages in the [place] It includes:  The serving size.  Information about nutrients in each serving, including the grams (g) of carbohydrate per serving. To use the "Nutrition Facts":  Decide how many servings you will have.  Multiply the number of servings by the number of carbohydrates per serving.  The resulting number is the total amount of carbohydrates that you will be having. Learning standard serving sizes of other foods  When you eat foods containing carbohydrates that are not packaged or do not include "Nutrition Facts" on the label, you need to measure the servings in order to count the amount of carbohydrates:  Measure the foods that you will eat with a food scale or measuring  cup, if needed.  Decide how many standard-size servings you will eat.  Multiply the number of servings by 15. Most carbohydrate-rich foods have about 15 g of carbohydrates per serving.  For example, if you eat 8 oz (170 g) of strawberries, you will have eaten 2 servings and 30 g of carbohydrates (2 servings x 15 g = 30 g).  For foods that have more than one food mixed, such as soups and casseroles, you must count the carbohydrates in each food that is included. The following list contains standard serving sizes of common carbohydrate-rich foods. Each of these servings has about 15 g of carbohydrates:   hamburger bun or  English muffin.   oz (15 mL) syrup.   oz (14 g) jelly.  1 slice of bread.  1 six-inch tortilla.  3 oz (85 g) cooked rice or pasta.  4 oz (113 g) cooked dried beans.  4 oz (113 g) starchy vegetable, such as peas, corn, or potatoes.  4 oz (113 g) hot cereal.  4 oz (113 g) mashed potatoes or  of a large baked potato.  4 oz (113 g) canned or frozen fruit.  4 oz (120 mL) fruit juice.  4-6 crackers.  6 chicken nuggets.  6 oz (170 g) unsweetened dry cereal.  6 oz (170 g) plain fat-free yogurt or yogurt sweetened with artificial sweeteners.  8 oz (240 mL) milk.  8 oz (170 g) fresh fruit or one small piece of fruit.  24 oz (680 g) popped popcorn. Example of carbohydrate counting Sample meal  3 oz (85 g) chicken breast.  6 oz (  170 g) brown rice.  4 oz (113 g) corn.  8 oz (240 mL) milk.  8 oz (170 g) strawberries with sugar-free whipped topping. Carbohydrate calculation 1. Identify the foods that contain carbohydrates:  Rice.  Corn.  Milk.  Strawberries. 2. Calculate how many servings you have of each food:  2 servings rice.  1 serving corn.  1 serving milk.  1 serving strawberries. 3. Multiply each number of servings by 15 g:  2 servings rice x 15 g = 30 g.  1 serving corn x 15 g = 15 g.  1 serving milk x 15 g = 15  g.  1 serving strawberries x 15 g = 15 g. 4. Add together all of the amounts to find the total grams of carbohydrates eaten:  30 g + 15 g + 15 g + 15 g = 75 g of carbohydrates total. This information is not intended to replace advice given to you by your health care provider. Make sure you discuss any questions you have with your health care provider. Document Released: 09/17/2005 Document Revised: 04/06/2016 Document Reviewed: 02/29/2016 Elsevier Interactive Patient Education  2017 Elsevier Inc.  

## 2016-10-11 NOTE — Progress Notes (Signed)
Pre visit review using our clinic review tool, if applicable. No additional management support is needed unless otherwise documented below in the visit note. 

## 2016-10-12 ENCOUNTER — Encounter: Payer: Self-pay | Admitting: Family Medicine

## 2016-10-12 DIAGNOSIS — Z79899 Other long term (current) drug therapy: Secondary | ICD-10-CM | POA: Diagnosis not present

## 2016-10-12 LAB — LIPID PANEL
CHOL/HDL RATIO: 4
Cholesterol: 170 mg/dL (ref 0–200)
HDL: 40.7 mg/dL (ref >39.00)
LDL CALC: 96 mg/dL (ref 0–99)
NonHDL: 129.72
TRIGLYCERIDES: 169 mg/dL — AB (ref 0.0–149.0)
VLDL: 33.8 mg/dL (ref 0.0–40.0)

## 2016-10-12 LAB — COMPREHENSIVE METABOLIC PANEL
ALBUMIN: 4.3 g/dL (ref 3.5–5.2)
ALT: 14 U/L (ref 0–53)
AST: 17 U/L (ref 0–37)
Alkaline Phosphatase: 89 U/L (ref 39–117)
BUN: 21 mg/dL (ref 6–23)
CALCIUM: 9.3 mg/dL (ref 8.4–10.5)
CHLORIDE: 106 meq/L (ref 96–112)
CO2: 29 meq/L (ref 19–32)
Creatinine, Ser: 1.38 mg/dL (ref 0.40–1.50)
GFR: 52.83 mL/min — ABNORMAL LOW (ref >60.00)
Glucose, Bld: 109 mg/dL — ABNORMAL HIGH (ref 70–99)
POTASSIUM: 4.6 meq/L (ref 3.5–5.1)
SODIUM: 141 meq/L (ref 135–145)
Total Bilirubin: 0.4 mg/dL (ref 0.2–1.2)
Total Protein: 6.7 g/dL (ref 6.0–8.3)

## 2016-10-12 LAB — CBC
HCT: 36.4 % — ABNORMAL LOW (ref 39.0–52.0)
HEMOGLOBIN: 12.2 g/dL — AB (ref 13.0–17.0)
MCHC: 33.4 g/dL (ref 30.0–36.0)
MCV: 90.5 fl (ref 78.0–100.0)
PLATELETS: 298 10*3/uL (ref 150.0–400.0)
RBC: 4.03 Mil/uL — AB (ref 4.22–5.81)
RDW: 14.4 % (ref 11.5–15.5)
WBC: 5.3 10*3/uL (ref 4.0–10.5)

## 2016-10-12 LAB — PSA, MEDICARE: PSA: 1.67 ng/mL (ref 0.10–4.00)

## 2016-10-12 LAB — HEMOGLOBIN A1C: Hgb A1c MFr Bld: 6.7 % — ABNORMAL HIGH (ref 4.6–6.5)

## 2016-10-12 LAB — TSH: TSH: 0.51 u[IU]/mL (ref 0.35–4.50)

## 2016-10-15 ENCOUNTER — Other Ambulatory Visit: Payer: Self-pay | Admitting: *Deleted

## 2016-10-15 ENCOUNTER — Telehealth: Payer: Self-pay | Admitting: Family Medicine

## 2016-10-15 DIAGNOSIS — G894 Chronic pain syndrome: Secondary | ICD-10-CM | POA: Diagnosis not present

## 2016-10-15 DIAGNOSIS — E1165 Type 2 diabetes mellitus with hyperglycemia: Principal | ICD-10-CM

## 2016-10-15 DIAGNOSIS — R200 Anesthesia of skin: Secondary | ICD-10-CM | POA: Diagnosis not present

## 2016-10-15 DIAGNOSIS — IMO0002 Reserved for concepts with insufficient information to code with codable children: Secondary | ICD-10-CM

## 2016-10-15 DIAGNOSIS — E1151 Type 2 diabetes mellitus with diabetic peripheral angiopathy without gangrene: Secondary | ICD-10-CM

## 2016-10-15 MED ORDER — LANCETS MISC
1 refills | Status: DC
Start: 2016-10-15 — End: 2017-01-16

## 2016-10-15 MED ORDER — LANCETS MISC: 1 refills | Status: DC

## 2016-10-15 MED ORDER — GLUCOSE BLOOD VI STRP: ORAL_STRIP | 1 refills | Status: DC

## 2016-10-15 MED ORDER — FENOFIBRATE 160 MG PO TABS: 160.0000 mg | ORAL_TABLET | Freq: Every day | ORAL | 2 refills | Status: DC

## 2016-10-15 NOTE — Telephone Encounter (Signed)
AWV scheduled 

## 2016-10-15 NOTE — Telephone Encounter (Signed)
Pt says that he just spoke with the nurse going over lab results and he meant to ask her to mail out a copy to him. Confirmed address on file.

## 2016-10-16 ENCOUNTER — Encounter: Payer: Self-pay | Admitting: *Deleted

## 2016-10-16 NOTE — Telephone Encounter (Signed)
Letter mailed to pt as requested.

## 2016-10-24 ENCOUNTER — Other Ambulatory Visit: Payer: Self-pay | Admitting: Internal Medicine

## 2016-10-24 DIAGNOSIS — M150 Primary generalized (osteo)arthritis: Secondary | ICD-10-CM | POA: Diagnosis not present

## 2016-10-24 DIAGNOSIS — M5136 Other intervertebral disc degeneration, lumbar region: Secondary | ICD-10-CM | POA: Diagnosis not present

## 2016-10-24 DIAGNOSIS — E669 Obesity, unspecified: Secondary | ICD-10-CM | POA: Diagnosis not present

## 2016-10-24 DIAGNOSIS — M0609 Rheumatoid arthritis without rheumatoid factor, multiple sites: Secondary | ICD-10-CM | POA: Diagnosis not present

## 2016-10-24 DIAGNOSIS — M810 Age-related osteoporosis without current pathological fracture: Secondary | ICD-10-CM | POA: Diagnosis not present

## 2016-10-24 DIAGNOSIS — Z6831 Body mass index (BMI) 31.0-31.9, adult: Secondary | ICD-10-CM | POA: Diagnosis not present

## 2016-10-24 DIAGNOSIS — M7989 Other specified soft tissue disorders: Secondary | ICD-10-CM | POA: Diagnosis not present

## 2016-11-02 ENCOUNTER — Telehealth: Payer: Self-pay | Admitting: Family Medicine

## 2016-11-02 NOTE — Telephone Encounter (Signed)
Pharmacy called in to follow up on form.

## 2016-11-02 NOTE — Telephone Encounter (Signed)
[facility] pharmacy called in to check the status of a Certificate of medical necessity form for pt. She says that it is required for medicare.

## 2016-11-06 NOTE — Telephone Encounter (Signed)
I have faxed over to [facility] this form for his testing supplies.

## 2016-11-08 DIAGNOSIS — I10 Essential (primary) hypertension: Secondary | ICD-10-CM | POA: Diagnosis not present

## 2016-11-08 DIAGNOSIS — Z6831 Body mass index (BMI) 31.0-31.9, adult: Secondary | ICD-10-CM | POA: Diagnosis not present

## 2016-11-08 DIAGNOSIS — M48062 Spinal stenosis, lumbar region with neurogenic claudication: Secondary | ICD-10-CM | POA: Diagnosis not present

## 2016-11-28 DIAGNOSIS — M4807 Spinal stenosis, lumbosacral region: Secondary | ICD-10-CM | POA: Diagnosis not present

## 2016-12-08 ENCOUNTER — Other Ambulatory Visit: Payer: Self-pay | Admitting: Family Medicine

## 2016-12-08 DIAGNOSIS — E785 Hyperlipidemia, unspecified: Secondary | ICD-10-CM

## 2016-12-22 DIAGNOSIS — G894 Chronic pain syndrome: Secondary | ICD-10-CM | POA: Diagnosis not present

## 2016-12-22 DIAGNOSIS — M5136 Other intervertebral disc degeneration, lumbar region: Secondary | ICD-10-CM | POA: Diagnosis not present

## 2016-12-22 DIAGNOSIS — Z79891 Long term (current) use of opiate analgesic: Secondary | ICD-10-CM | POA: Diagnosis not present

## 2017-01-11 NOTE — Progress Notes (Signed)
Pre visit review using our clinic review tool, if applicable. No additional management support is needed unless otherwise documented below in the visit note. 

## 2017-01-11 NOTE — Progress Notes (Addendum)
Subjective:   Louis [name]. is a 81 y.o. male who presents for Medicare Annual/Subsequent preventive examination.  He stays active volunteering with his church and several other organizations in the community. He is checking his blood sugar at home about once a week, averaging 100s-130s.   Review of Systems:  No ROS.  Medicare Wellness Visit.  Cardiac Risk Factors include: advanced age (>64men, >14 women);diabetes mellitus;dyslipidemia;obesity (BMI >30kg/m2);male gender;hypertension  Sleep patterns: no sleep issues, does not get up to void and sleeps 6-9 hours nightly.   Home Safety/Smoke Alarms: Feels safe in home. Smoke alarms in place. Security system in place.  Living environment; residence and Firearm Safety: Lives w/ wife. 2-story house, number of inside stairs: 1 flight, can live on one level, firearms stored safely. Seat Belt Safety/Bike Helmet: Wears seat belt.   Counseling:   Eye Exam- Dr. Annice Pih yearly.  Dental- Follows w/ Dr. Docia Barrier (periodontist) and Dr. Edsel Petrin. Partial plate upper, full dentures lower.  Male:   CCS- last 10/27/15. 4 tiny polyps removed, severe diverticulosis. No recall due to age.    PSA-  Lab Results  Component Value Date   PSA 1.67 10/11/2016   PSA 0.86 08/28/2010      Objective:    Vitals: BP 126/64   Pulse 85   Ht 5' 10.5" (1.791 m)   Wt 226 lb (102.5 kg)   SpO2 98%   BMI 31.97 kg/m   Body mass index is 31.97 kg/m.  Wt Readings from Last 3 Encounters:  01/14/17 226 lb (102.5 kg)  10/11/16 230 lb (104.3 kg)  07/30/16 223 lb (101.2 kg)   Tobacco History  Smoking Status  . Former Smoker  . Quit date: 10/01/1980  Smokeless Tobacco  . Never Used     Counseling given: Not Answered   Past Medical History:  Diagnosis Date  . Anemia   . Arthritis    Dr Amil Amen, [facility] rx- Rheumatology  . Diabetes mellitus   . Diverticulosis   . GERD (gastroesophageal reflux disease)   . Hemochromatosis     Dr Henrene Pastor  . History of  kidney stones 1980   passed spont.  Marland Kitchen Hx of adenomatous colonic polyps   . Hyperlipidemia   . Hypertension   . Hypothyroidism   . IBS (irritable bowel syndrome)   . Internal hemorrhoids   . Kidney stones, calcium oxalate    1985 & 2005  . Pulmonary embolism ([place]) 2015   hosp. with Heparin & then on Xarelto- 3 months or so  . Reflux esophagitis   . SBO (small bowel obstruction) ([place]) 2004  . Spinal stenosis    Dr.Ramos  . Testosterone deficiency    111 in 2007   Past Surgical History:  Procedure Laterality Date  . APPENDECTOMY    . cataract  2005   bilaterally  . COLONOSCOPY W/ POLYPECTOMY  2005   tubular adenoma  . JOINT REPLACEMENT     L- shoulder, x2, replacement on both knees   . L4-5 & L5- S1 bilat facet injections  01/20/14    Dr Nelva Bush  . LUMBAR LAMINECTOMY WITH COFLEX 2 LEVEL N/A 07/30/2016   Procedure: Lumbar three- four Lumbar four - five Laminectomy and foraminotomy;  Surgeon: Kristeen Miss, MD;  Location: [facility];  Service: Neurosurgery;  Laterality: N/A;  L3-4 L4-5 Laminectomy with coflex  . lumbar nerve block      X 14; Dr Nelva Bush  . MIDDLE EAR SURGERY  1980   Teflon tack   .  REFRACTIVE SURGERY  2005   SE Ophth  . SHOULDER SURGERY      2 on L ; 3 on R  . TM replacement  1981  . TONSILLECTOMY AND ADENOIDECTOMY    . TOTAL KNEE ARTHROPLASTY   201 & 2012    bilaterally;Dr Alusio  . TOTAL SHOULDER REPLACEMENT     L   Family History  Problem Relation Age of Onset  . Diabetes Maternal Aunt   . Depression Brother     suicide  . Diabetes Father   . Stroke Father 61    MI in hospital for CVA  . Heart failure Mother     valvular disease  . Heart disease Paternal Aunt      2 aunts; 1 had MI @ ?> 20  . Heart disease Paternal Uncle        . Breast cancer Maternal Grandmother   . Colon cancer Neg Hx   . COPD Neg Hx   . Asthma Neg Hx    History  Sexual Activity  . Sexual activity: Not Currently    Outpatient Encounter Prescriptions as of 01/14/2017    Medication Sig  . ALPRAZolam (XANAX) 0.5 MG tablet TAKE ONE-HALF BY MOUTH EVERY 8 TO 12 HOURS AS NEEDED (Patient taking differently: Take 0.5 mg by mouth. TAKE ONE-HALF BY MOUTH EVERY 8 TO 12 HOURS AS NEEDED)  . amoxicillin (AMOXIL) 500 MG tablet Take 500 mg by mouth daily as needed (dental procedure). Reported on 10/27/2015  . aspirin EC 81 MG tablet Take 81 mg by mouth at bedtime.  Marland Kitchen atorvastatin (LIPITOR) 20 MG tablet TAKE 1 TABLET DAILY AT 6 P.M.  . CALCIUM-VITAMIN D PO Take 1 tablet by mouth daily.   . diphenhydrAMINE (BENADRYL) 25 mg capsule Take 25 mg by mouth at bedtime as needed for allergies (allergies). Reported on 10/27/2015  . fenofibrate 160 MG tablet TAKE 1 TABLET(160 MG) BY MOUTH DAILY  . folic acid (FOLVITE) 1 MG tablet Take 1 mg by mouth daily.    Marland Kitchen glucose blood (ONETOUCH VERIO) test strip Use as instructed once daily. DX code: E11.59  . HYDROcodone-acetaminophen (NORCO) 10-325 MG per tablet Take 1 tablet by mouth every 4 (four) hours as needed for moderate pain or severe pain (pain).   . Lancets MISC Use as instructed once daily.  DX code: E11.59  . levothyroxine (SYNTHROID, LEVOTHROID) 125 MCG tablet TAKE 1 TABLET(125 MCG) BY MOUTH DAILY  . lisinopril (PRINIVIL,ZESTRIL) 20 MG tablet Take 1 tablet (20 mg total) by mouth daily. (Patient taking differently: Take 20 mg by mouth at bedtime. )  . methocarbamol (ROBAXIN) 500 MG tablet Take 1 tablet (500 mg total) by mouth every 6 (six) hours as needed for muscle spasms.  . methotrexate (RHEUMATREX) 2.5 MG tablet Take 15 mg by mouth once a week. Monday Night.  . multivitamin (THERAGRAN) per tablet Take 1 tablet by mouth daily.    Marland Kitchen omeprazole (PRILOSEC OTC) 20 MG tablet Take 20 mg by mouth daily.  . ondansetron (ZOFRAN) 4 MG tablet TAKE 1 TABLET EVERY 6 HOURS AS NEEDED FOR NAUSEA OR VOMITING  . pioglitazone (ACTOS) 15 MG tablet Take 1 tablet (15 mg total) by mouth daily.  . vitamin B-12 (CYANOCOBALAMIN) 1000 MCG tablet Take 1,000 mcg  by mouth daily.  . [DISCONTINUED] fenofibrate 160 MG tablet Take 1 tablet (160 mg total) by mouth daily.  . [DISCONTINUED] oxyCODONE-acetaminophen (PERCOCET/ROXICET) 5-325 MG tablet Take 1-2 tablets by mouth every 4 (four) hours as needed for  moderate pain. (Patient not taking: Reported on 01/14/2017)   No facility-administered encounter medications on file as of 01/14/2017.     Activities of Daily Living In your present state of health, do you have any difficulty performing the following activities: 01/14/2017 07/19/2016  Hearing? Y N  Vision? N -  Difficulty concentrating or making decisions? N N  Walking or climbing stairs? N N  Dressing or bathing? N N  Doing errands, shopping? N -  Preparing Food and eating ? N -  Using the Toilet? N -  In the past six months, have you accidently leaked urine? N -  Do you have problems with loss of bowel control? N -  Managing your Medications? N -  Managing your Finances? N -  Housekeeping or managing your Housekeeping? N -  Some recent data might be hidden    Patient Care Team: Ann Held, DO as PCP - General (Family Medicine) Hennie Duos, MD as Consulting Physician (Rheumatology) Suella Broad, MD as Consulting Physician (Physical Medicine and Rehabilitation) Angelica Ran, [place] as Consulting Physician (Optometry) Irene Shipper, MD as Consulting Physician (Gastroenterology) Sydell Axon, DDS as Consulting Physician (Dentistry) Alean Rinne, DDS as Consulting Physician (Dentistry)   Assessment:    Physical assessment deferred to PCP.  Exercise Activities and Dietary recommendations Current Exercise Habits: Home exercise routine, Type of exercise: Other - see comments (plays golf), Exercise limited by: orthopedic condition(s) (chronic back pain)  Diet (meal preparation, eat out, water intake, caffeinated beverages, dairy products, fruits and vegetables): in general, a "healthy" diet  , well balanced, on average, 3 meals  per day. Regular diet. Does not follow any particular diet and does not count carbs. Drinks diet Dr. Malachi Bonds and unsweetened iced decaf tea. Does not drink enough water.   Goals    . Increase physical activity          Start walking again as tolerated.    . Increase water intake      Fall Risk Fall Risk  01/14/2017 10/11/2016 12/02/2015 11/03/2014 10/09/2013  Falls in the past year? No No No No No   Depression Screen PHQ 2/9 Scores 01/14/2017 10/11/2016 12/02/2015 11/03/2014  PHQ - 2 Score 0 0 0 0    Cognitive Function MMSE - Mini Mental State Exam 01/14/2017  Orientation to time 5  Orientation to Place 5  Registration 3  Attention/ Calculation 4  Recall 2  Language- name 2 objects 2  Language- repeat 1  Language- follow 3 step command 3  Language- read & follow direction 1  Write a sentence 1  Copy design 1  Total score 28        Immunization History  Administered Date(s) Administered  . Influenza Whole 07/16/2007, 06/09/2008, 12/30/2008  . Influenza, High Dose Seasonal PF 08/03/2014  . Influenza-Unspecified 09/30/2015, 07/01/2016  . Pneumococcal Conjugate-13 09/30/2015  . Pneumococcal Polysaccharide-23 04/15/2012  . Zoster 10/11/2014   Screening Tests Health Maintenance  Topic Date Due  . TETANUS/TDAP  10/16/1954  . HEMOGLOBIN A1C  04/10/2017  . INFLUENZA VACCINE  05/01/2017  . OPHTHALMOLOGY EXAM  08/01/2017  . FOOT EXAM  01/14/2018  . PNA vac Low Risk Adult  Completed      Plan:   Follow-up w/ PCP as scheduled.  Bring a copy of your advance directives to your next office visit.  During the course of the visit the patient was educated and counseled about the following appropriate screening and preventive services:   Vaccines to  include Pneumococcal, Influenza, Td, HCV  Cardiovascular Disease  Colorectal cancer screening  Diabetes screening  Prostate Cancer Screening  Glaucoma screening  Nutrition counseling   Patient Instructions (the written plan) was  given to the patient.    Dorrene German, RN  01/14/2017  I have reviewed note by Ms. Ermalinda Barrios and agree with her assessment and plan

## 2017-01-13 ENCOUNTER — Other Ambulatory Visit: Payer: Self-pay | Admitting: Family Medicine

## 2017-01-14 ENCOUNTER — Encounter: Payer: Self-pay | Admitting: *Deleted

## 2017-01-14 ENCOUNTER — Ambulatory Visit (INDEPENDENT_AMBULATORY_CARE_PROVIDER_SITE_OTHER): Payer: Medicare Other | Admitting: *Deleted

## 2017-01-14 ENCOUNTER — Telehealth: Payer: Self-pay | Admitting: *Deleted

## 2017-01-14 VITALS — BP 126/64 | HR 85 | Ht 70.5 in | Wt 226.0 lb

## 2017-01-14 DIAGNOSIS — Z0000 Encounter for general adult medical examination without abnormal findings: Secondary | ICD-10-CM

## 2017-01-14 DIAGNOSIS — E1159 Type 2 diabetes mellitus with other circulatory complications: Secondary | ICD-10-CM | POA: Diagnosis not present

## 2017-01-14 DIAGNOSIS — E785 Hyperlipidemia, unspecified: Secondary | ICD-10-CM

## 2017-01-14 MED ORDER — FLUTICASONE PROPIONATE 50 MCG/ACT NA SUSP: 2.0000 | Freq: Every day | NASAL | 1 refills | Status: AC

## 2017-01-14 NOTE — Telephone Encounter (Signed)
rx flonase sent to pt pharmacy.

## 2017-01-14 NOTE — Patient Instructions (Addendum)
  Louis [name] , Thank you for taking time to come for your Medicare Wellness Visit. I appreciate your ongoing commitment to your health goals. Please review the following plan we discussed and let me know if I can assist you in the future.   Bring a copy of your advance directives to your next office visit.  These are the goals we discussed: Goals    . Increase physical activity          Start walking again as tolerated.    . Increase water intake       This is a list of the screening recommended for you and due dates:  Health Maintenance  Topic Date Due  . Tetanus Vaccine  10/16/1956  . Hemoglobin A1C  04/10/2017  . Flu Shot  05/01/2017  . Eye exam for diabetics  08/01/2017  . Complete foot exam   01/14/2018  . Pneumonia vaccines  Completed

## 2017-01-14 NOTE — Telephone Encounter (Signed)
Patient requesting rx for fluticasone (Flonase) nasal spray to use PRN for seasonal allergies. He is experiencing nasal congestion and borrowed his wife's medication and found that it worked well. He was advised not to borrow/share meds.  Please advise rx request.  Pharmacy: [facility], [place]

## 2017-01-15 ENCOUNTER — Other Ambulatory Visit (INDEPENDENT_AMBULATORY_CARE_PROVIDER_SITE_OTHER): Payer: Medicare Other

## 2017-01-15 DIAGNOSIS — E1159 Type 2 diabetes mellitus with other circulatory complications: Secondary | ICD-10-CM

## 2017-01-15 DIAGNOSIS — E785 Hyperlipidemia, unspecified: Secondary | ICD-10-CM | POA: Diagnosis not present

## 2017-01-15 LAB — COMPREHENSIVE METABOLIC PANEL
ALK PHOS: 57 U/L (ref 39–117)
ALT: 14 U/L (ref 0–53)
AST: 15 U/L (ref 0–37)
Albumin: 4.3 g/dL (ref 3.5–5.2)
BILIRUBIN TOTAL: 0.4 mg/dL (ref 0.2–1.2)
BUN: 22 mg/dL (ref 6–23)
CALCIUM: 9.3 mg/dL (ref 8.4–10.5)
CO2: 27 meq/L (ref 19–32)
Chloride: 106 mEq/L (ref 96–112)
Creatinine, Ser: 1.82 mg/dL — ABNORMAL HIGH (ref 0.40–1.50)
GFR: 38.36 mL/min — AB (ref >60.00)
Glucose, Bld: 126 mg/dL — ABNORMAL HIGH (ref 70–99)
POTASSIUM: 4.2 meq/L (ref 3.5–5.1)
Sodium: 140 mEq/L (ref 135–145)
Total Protein: 6.9 g/dL (ref 6.0–8.3)

## 2017-01-15 LAB — LIPID PANEL
CHOL/HDL RATIO: 4
Cholesterol: 157 mg/dL (ref 0–200)
HDL: 42.4 mg/dL (ref >39.00)
LDL Cholesterol: 85 mg/dL (ref 0–99)
NonHDL: 114.69
TRIGLYCERIDES: 146 mg/dL (ref 0.0–149.0)
VLDL: 29.2 mg/dL (ref 0.0–40.0)

## 2017-01-15 LAB — HEMOGLOBIN A1C: Hgb A1c MFr Bld: 7 % — ABNORMAL HIGH (ref 4.6–6.5)

## 2017-01-16 ENCOUNTER — Other Ambulatory Visit: Payer: Self-pay | Admitting: Family Medicine

## 2017-01-16 DIAGNOSIS — IMO0002 Reserved for concepts with insufficient information to code with codable children: Secondary | ICD-10-CM

## 2017-01-16 DIAGNOSIS — E1165 Type 2 diabetes mellitus with hyperglycemia: Principal | ICD-10-CM

## 2017-01-16 DIAGNOSIS — N289 Disorder of kidney and ureter, unspecified: Secondary | ICD-10-CM

## 2017-01-16 DIAGNOSIS — E1151 Type 2 diabetes mellitus with diabetic peripheral angiopathy without gangrene: Secondary | ICD-10-CM

## 2017-01-16 MED ORDER — LANCETS MISC
3 refills | Status: DC
Start: 2017-01-16 — End: 2017-06-19

## 2017-01-16 MED ORDER — LISINOPRIL 5 MG PO TABS: 5.0000 mg | ORAL_TABLET | Freq: Every day | ORAL | 5 refills | Status: DC

## 2017-02-07 ENCOUNTER — Telehealth: Payer: Self-pay

## 2017-02-07 ENCOUNTER — Other Ambulatory Visit (INDEPENDENT_AMBULATORY_CARE_PROVIDER_SITE_OTHER): Payer: Medicare Other

## 2017-02-07 DIAGNOSIS — N289 Disorder of kidney and ureter, unspecified: Secondary | ICD-10-CM

## 2017-02-07 LAB — COMPREHENSIVE METABOLIC PANEL
ALBUMIN: 4.5 g/dL (ref 3.5–5.2)
ALK PHOS: 54 U/L (ref 39–117)
ALT: 17 U/L (ref 0–53)
AST: 20 U/L (ref 0–37)
BUN: 23 mg/dL (ref 6–23)
CALCIUM: 9.2 mg/dL (ref 8.4–10.5)
CHLORIDE: 105 meq/L (ref 96–112)
CO2: 27 mEq/L (ref 19–32)
Creatinine, Ser: 1.67 mg/dL — ABNORMAL HIGH (ref 0.40–1.50)
GFR: 42.36 mL/min — AB (ref >60.00)
Glucose, Bld: 162 mg/dL — ABNORMAL HIGH (ref 70–99)
POTASSIUM: 4 meq/L (ref 3.5–5.1)
SODIUM: 140 meq/L (ref 135–145)
Total Bilirubin: 0.5 mg/dL (ref 0.2–1.2)
Total Protein: 6.9 g/dL (ref 6.0–8.3)

## 2017-02-07 NOTE — Telephone Encounter (Signed)
Ok to check bp

## 2017-02-08 ENCOUNTER — Ambulatory Visit (INDEPENDENT_AMBULATORY_CARE_PROVIDER_SITE_OTHER): Payer: Medicare Other | Admitting: Family Medicine

## 2017-02-08 VITALS — BP 127/58 | HR 63

## 2017-02-08 DIAGNOSIS — I10 Essential (primary) hypertension: Secondary | ICD-10-CM | POA: Diagnosis not present

## 2017-02-08 NOTE — Progress Notes (Signed)
Noted. Agree with above.  

## 2017-02-08 NOTE — Patient Instructions (Signed)
Per Dr. Nani Ravens continue to take medications as ordered and follow up with Dr. Lawson Radar in 2 weeks.  Appointment scheduled for Feb 19, 2017 @ 2:45 pm.

## 2017-02-08 NOTE — Progress Notes (Signed)
Pre visit review using our clinic tool,if applicable. No additional management support is needed unless otherwise documented below in the visit note.   Patient in for BP check per order from Dr. Floy Sabina dated 02/07/17.  Patient states he took BP medication Lisinopril 5 mg last night which is when he normally takes it. States he was taking 20 mg then decreased to 10 mg and was told to decrease to 5 mg due to lab result.   Advised patient that Lisinopril 5 mg is current order and to take daily. Patient agreed  Bp today = 127/58 P =63  Per Dr. Nani Ravens continue to take medications as ordered and follow up with Dr. Carollee Herter in 2 weeks.  Appointment scheduled for Feb 19, 2017 @ 2:45 pm.

## 2017-02-19 ENCOUNTER — Encounter: Payer: Self-pay | Admitting: Family Medicine

## 2017-02-19 ENCOUNTER — Ambulatory Visit (INDEPENDENT_AMBULATORY_CARE_PROVIDER_SITE_OTHER): Payer: Medicare Other | Admitting: Family Medicine

## 2017-02-19 DIAGNOSIS — I10 Essential (primary) hypertension: Secondary | ICD-10-CM | POA: Diagnosis not present

## 2017-02-19 DIAGNOSIS — F411 Generalized anxiety disorder: Secondary | ICD-10-CM

## 2017-02-19 MED ORDER — ALPRAZOLAM 0.5 MG PO TABS: ORAL_TABLET | ORAL | 3 refills | Status: DC

## 2017-02-19 NOTE — Progress Notes (Signed)
Patient ID: Louis Ivan., male   DOB: 21-Nov-1935, 81 y.o.   MRN: [id number]     Subjective:  I acted as a Education administrator for Dr. Carollee Herter.  Louis [name], Louis [name]   Patient ID: Louis Ivan., male    DOB: Dec 28, 1935, 81 y.o.   MRN: [id number]  Chief Complaint  Patient presents with  . Hypertension    HPI  Patient is in today for follow up blood pressure and refill on alprazolam.  He has been doing well on current treatment.  He was told to come back in 2 weeks for follow up blood pressure from nurse visit on 02/08/17.  He has been taking the alprazolam as needed.   Patient Care Team: Carollee Herter, Alferd Apa, DO as PCP - General (Family Medicine) Hennie Duos, MD as Consulting Physician (Rheumatology) Suella Broad, MD as Consulting Physician (Physical Medicine and Rehabilitation) [place], [place], [place] as Consulting Physician (Optometry) Irene Shipper, MD as Consulting Physician (Gastroenterology) Sydell Axon, DDS as Consulting Physician (Dentistry) Alean Rinne, DDS as Consulting Physician (Dentistry)   Past Medical History:  Diagnosis Date  . Anemia   . Arthritis    Dr Amil Amen, [facility] rx- Rheumatology  . Diabetes mellitus   . Diverticulosis   . GERD (gastroesophageal reflux disease)   . Hemochromatosis     Dr Henrene Pastor  . History of kidney stones 1980   passed spont.  Marland Kitchen Hx of adenomatous colonic polyps   . Hyperlipidemia   . Hypertension   . Hypothyroidism   . IBS (irritable bowel syndrome)   . Internal hemorrhoids   . Kidney stones, calcium oxalate    1985 & 2005  . Pulmonary embolism ([place]) 2015   hosp. with Heparin & then on Xarelto- 3 months or so  . Reflux esophagitis   . SBO (small bowel obstruction) ([place]) 2004  . Spinal stenosis    Dr.Ramos  . Testosterone deficiency    111 in 2007    Past Surgical History:  Procedure Laterality Date  . APPENDECTOMY    . cataract  2005   bilaterally  . COLONOSCOPY W/ POLYPECTOMY  2005   tubular adenoma  . JOINT  REPLACEMENT     L- shoulder, x2, replacement on both knees   . L4-5 & L5- S1 bilat facet injections  01/20/14    Dr Nelva Bush  . LUMBAR LAMINECTOMY WITH COFLEX 2 LEVEL N/A 07/30/2016   Procedure: Lumbar three- four Lumbar four - five Laminectomy and foraminotomy;  Surgeon: Kristeen Miss, MD;  Location: [facility];  Service: Neurosurgery;  Laterality: N/A;  L3-4 L4-5 Laminectomy with coflex  . lumbar nerve block      X 14; Dr Nelva Bush  . MIDDLE EAR SURGERY  1980   Teflon tack   . REFRACTIVE SURGERY  2005   SE Ophth  . SHOULDER SURGERY      2 on L ; 3 on R  . TM replacement  1981  . TONSILLECTOMY AND ADENOIDECTOMY    . TOTAL KNEE ARTHROPLASTY   201 & 2012    bilaterally;Dr Alusio  . TOTAL SHOULDER REPLACEMENT     L    Family History  Problem Relation Age of Onset  . Diabetes Maternal Aunt   . Depression Brother        suicide  . Diabetes Father   . Stroke Father 15       MI in hospital for CVA  . Heart failure Mother  valvular disease  . Heart disease Paternal Aunt         2 aunts; 1 had MI @ ?> 37  . Heart disease Paternal Uncle           . Breast cancer Maternal Grandmother   . Colon cancer Neg Hx   . COPD Neg Hx   . Asthma Neg Hx     Social History   Social History  . Marital status: Married    Spouse name: N/A  . Number of children: N/A  . Years of education: N/A   Occupational History  . Not on file.   Social History Main Topics  . Smoking status: Former Smoker    Quit date: 10/01/1980  . Smokeless tobacco: Never Used  . Alcohol use 0.0 oz/week     Comment: rare   . Drug use: No  . Sexual activity: Not Currently   Other Topics Concern  . Not on file   Social History Narrative   Daily caffeine     Outpatient Medications Prior to Visit  Medication Sig Dispense Refill  . amoxicillin (AMOXIL) 500 MG tablet Take 500 mg by mouth daily as needed (dental procedure). Reported on 10/27/2015    . aspirin EC 81 MG tablet Take 81 mg by mouth at bedtime.    Marland Kitchen  atorvastatin (LIPITOR) 20 MG tablet TAKE 1 TABLET DAILY AT 6 P.M. 90 tablet 3  . CALCIUM-VITAMIN D PO Take 1 tablet by mouth daily.     . diphenhydrAMINE (BENADRYL) 25 mg capsule Take 25 mg by mouth at bedtime as needed for allergies (allergies). Reported on 10/27/2015    . fenofibrate 160 MG tablet TAKE 1 TABLET(160 MG) BY MOUTH DAILY 30 tablet 6  . fluticasone (FLONASE) 50 MCG/ACT nasal spray Place 2 sprays into both nostrils daily. 16 g 1  . folic acid (FOLVITE) 1 MG tablet Take 1 mg by mouth daily.      Marland Kitchen glucose blood (ONETOUCH VERIO) test strip Use as instructed once daily. DX code: E11.59 100 each 1  . HYDROcodone-acetaminophen (NORCO) 10-325 MG per tablet Take 1 tablet by mouth every 4 (four) hours as needed for moderate pain or severe pain (pain).     . Lancets MISC Use as instructed once daily.  DX code: E11.59 200 each 3  . levothyroxine (SYNTHROID, LEVOTHROID) 125 MCG tablet TAKE 1 TABLET(125 MCG) BY MOUTH DAILY 90 tablet 1  . lisinopril (PRINIVIL,ZESTRIL) 5 MG tablet Take 1 tablet (5 mg total) by mouth daily. 30 tablet 5  . methocarbamol (ROBAXIN) 500 MG tablet Take 1 tablet (500 mg total) by mouth every 6 (six) hours as needed for muscle spasms. 40 tablet 3  . methotrexate (RHEUMATREX) 2.5 MG tablet Take 15 mg by mouth once a week. Monday Night.    . multivitamin (THERAGRAN) per tablet Take 1 tablet by mouth daily.      Marland Kitchen omeprazole (PRILOSEC OTC) 20 MG tablet Take 20 mg by mouth daily.    . ondansetron (ZOFRAN) 4 MG tablet TAKE 1 TABLET EVERY 6 HOURS AS NEEDED FOR NAUSEA OR VOMITING 40 tablet 1  . pioglitazone (ACTOS) 15 MG tablet Take 1 tablet (15 mg total) by mouth daily. 90 tablet 3  . vitamin B-12 (CYANOCOBALAMIN) 1000 MCG tablet Take 1,000 mcg by mouth daily.    Marland Kitchen ALPRAZolam (XANAX) 0.5 MG tablet TAKE ONE-HALF BY MOUTH EVERY 8 TO 12 HOURS AS NEEDED (Patient taking differently: Take 0.5 mg by mouth. TAKE ONE-HALF BY MOUTH EVERY  8 TO 12 HOURS AS NEEDED) 30 tablet 3   No  facility-administered medications prior to visit.     Allergies  Allergen Reactions  . Shellfish Allergy     ANGIOEDEMA  . Garlic Nausea And Vomiting    Review of Systems  Constitutional: Negative for fever and malaise/fatigue.  HENT: Negative for congestion.   Eyes: Negative for blurred vision.  Respiratory: Negative for cough and shortness of breath.   Cardiovascular: Negative for chest pain, palpitations and leg swelling.  Gastrointestinal: Negative for vomiting.  Musculoskeletal: Negative for back pain.  Skin: Negative for rash.  Neurological: Negative for loss of consciousness and headaches.       Objective:    Physical Exam  Constitutional: He appears well-developed and well-nourished. No distress.  HENT:  Head: Normocephalic and atraumatic.  Eyes: Conjunctivae are normal.  Neck: Normal range of motion. No thyromegaly present.  Cardiovascular: Normal rate and regular rhythm.   Pulmonary/Chest: Effort normal. He has no wheezes.  Abdominal: Soft. Bowel sounds are normal. There is no tenderness.  Musculoskeletal: Normal range of motion. He exhibits no edema or deformity.  Neurological: He is alert.  Skin: Skin is warm and dry. He is not diaphoretic.  Psychiatric: He has a normal mood and affect.    BP 138/60 (BP Location: Right Arm, Cuff Size: Large)   Pulse 68   Temp 98.5 F (36.9 C) (Oral)   Resp 16   Ht 5\' 11"  (1.803 m)   Wt 226 lb 12.8 oz (102.9 kg)   SpO2 98%   BMI 31.63 kg/m  Wt Readings from Last 3 Encounters:  02/19/17 226 lb 12.8 oz (102.9 kg)  01/14/17 226 lb (102.5 kg)  10/11/16 230 lb (104.3 kg)   BP Readings from Last 3 Encounters:  02/19/17 138/60  02/08/17 (!) 127/58  01/14/17 126/64     Immunization History  Administered Date(s) Administered  . Influenza Whole 07/16/2007, 06/09/2008, 12/30/2008  . Influenza, High Dose Seasonal PF 08/03/2014  . Influenza-Unspecified 09/30/2015, 07/01/2016  . Pneumococcal Conjugate-13 09/30/2015  .  Pneumococcal Polysaccharide-23 04/15/2012  . Zoster 10/11/2014    Health Maintenance  Topic Date Due  . TETANUS/TDAP  10/16/1956  . INFLUENZA VACCINE  05/01/2017  . HEMOGLOBIN A1C  07/17/2017  . OPHTHALMOLOGY EXAM  08/01/2017  . FOOT EXAM  01/14/2018  . PNA vac Low Risk Adult  Completed    Lab Results  Component Value Date   WBC 5.3 10/11/2016   HGB 12.2 (L) 10/11/2016   HCT 36.4 (L) 10/11/2016   PLT 298.0 10/11/2016   GLUCOSE 162 (H) 02/07/2017   CHOL 157 01/15/2017   TRIG 146.0 01/15/2017   HDL 42.40 01/15/2017   LDLDIRECT 120.0 12/02/2015   LDLCALC 85 01/15/2017   ALT 17 02/07/2017   AST 20 02/07/2017   NA 140 02/07/2017   K 4.0 02/07/2017   CL 105 02/07/2017   CREATININE 1.67 (H) 02/07/2017   BUN 23 02/07/2017   CO2 27 02/07/2017   TSH 0.51 10/11/2016   PSA 1.67 10/11/2016   INR 1.00 08/17/2014   HGBA1C 7.0 (H) 01/15/2017   MICROALBUR 3.0 (H) 12/08/2014    Lab Results  Component Value Date   TSH 0.51 10/11/2016   Lab Results  Component Value Date   WBC 5.3 10/11/2016   HGB 12.2 (L) 10/11/2016   HCT 36.4 (L) 10/11/2016   MCV 90.5 10/11/2016   PLT 298.0 10/11/2016   Lab Results  Component Value Date   NA 140 02/07/2017  K 4.0 02/07/2017   CO2 27 02/07/2017   GLUCOSE 162 (H) 02/07/2017   BUN 23 02/07/2017   CREATININE 1.67 (H) 02/07/2017   BILITOT 0.5 02/07/2017   ALKPHOS 54 02/07/2017   AST 20 02/07/2017   ALT 17 02/07/2017   PROT 6.9 02/07/2017   ALBUMIN 4.5 02/07/2017   CALCIUM 9.2 02/07/2017   ANIONGAP 8 07/19/2016   GFR 42.36 (L) 02/07/2017   Lab Results  Component Value Date   CHOL 157 01/15/2017   Lab Results  Component Value Date   HDL 42.40 01/15/2017   Lab Results  Component Value Date   LDLCALC 85 01/15/2017   Lab Results  Component Value Date   TRIG 146.0 01/15/2017   Lab Results  Component Value Date   CHOLHDL 4 01/15/2017   Lab Results  Component Value Date   HGBA1C 7.0 (H) 01/15/2017         Assessment &  Plan:   Problem List Items Addressed This Visit      Unprioritized   Anxiety state   Relevant Medications   ALPRAZolam (XANAX) 0.5 MG tablet   Essential hypertension    Well controlled, no changes to meds. Encouraged heart healthy diet such as the DASH diet and exercise as tolerated.          I am having Mr. Panepinto maintain his CALCIUM-VITAMIN D PO, folic acid, HYDROcodone-acetaminophen, diphenhydrAMINE, methotrexate, multivitamin, omeprazole, vitamin B-12, amoxicillin, pioglitazone, levothyroxine, aspirin EC, methocarbamol, glucose blood, ondansetron, atorvastatin, fenofibrate, fluticasone, Lancets, lisinopril, and ALPRAZolam.  Meds ordered this encounter  Medications  . ALPRAZolam (XANAX) 0.5 MG tablet    Sig: TAKE ONE-HALF BY MOUTH EVERY 8 TO 12 HOURS AS NEEDED    Dispense:  30 tablet    Refill:  3    CMA served as scribe during this visit. History, Physical and Plan performed by medical provider. Documentation and orders reviewed and attested to.  Ann Held, DO

## 2017-02-19 NOTE — Patient Instructions (Signed)

## 2017-02-20 NOTE — Assessment & Plan Note (Signed)
Well controlled, no changes to meds. Encouraged heart healthy diet such as the DASH diet and exercise as tolerated.  °

## 2017-02-27 ENCOUNTER — Other Ambulatory Visit: Payer: Self-pay | Admitting: Family Medicine

## 2017-02-27 DIAGNOSIS — E119 Type 2 diabetes mellitus without complications: Secondary | ICD-10-CM

## 2017-03-25 DIAGNOSIS — M5136 Other intervertebral disc degeneration, lumbar region: Secondary | ICD-10-CM | POA: Diagnosis not present

## 2017-03-25 DIAGNOSIS — M4807 Spinal stenosis, lumbosacral region: Secondary | ICD-10-CM | POA: Diagnosis not present

## 2017-03-25 DIAGNOSIS — G894 Chronic pain syndrome: Secondary | ICD-10-CM | POA: Diagnosis not present

## 2017-03-25 DIAGNOSIS — Z79891 Long term (current) use of opiate analgesic: Secondary | ICD-10-CM | POA: Diagnosis not present

## 2017-03-26 DIAGNOSIS — E669 Obesity, unspecified: Secondary | ICD-10-CM | POA: Diagnosis not present

## 2017-03-26 DIAGNOSIS — Z6830 Body mass index (BMI) 30.0-30.9, adult: Secondary | ICD-10-CM | POA: Diagnosis not present

## 2017-03-26 DIAGNOSIS — M810 Age-related osteoporosis without current pathological fracture: Secondary | ICD-10-CM | POA: Diagnosis not present

## 2017-03-26 DIAGNOSIS — M0609 Rheumatoid arthritis without rheumatoid factor, multiple sites: Secondary | ICD-10-CM | POA: Diagnosis not present

## 2017-03-26 DIAGNOSIS — M150 Primary generalized (osteo)arthritis: Secondary | ICD-10-CM | POA: Diagnosis not present

## 2017-03-26 DIAGNOSIS — M5136 Other intervertebral disc degeneration, lumbar region: Secondary | ICD-10-CM | POA: Diagnosis not present

## 2017-04-11 ENCOUNTER — Ambulatory Visit: Payer: Medicare Other | Admitting: Family Medicine

## 2017-04-18 ENCOUNTER — Encounter: Payer: Medicare Other | Admitting: Family Medicine

## 2017-04-23 ENCOUNTER — Telehealth: Payer: Self-pay | Admitting: Family Medicine

## 2017-04-23 NOTE — Telephone Encounter (Signed)
Relation to YW:XIPP  Call back [place]: [facility] [place], [place] [phone] (Phone) [phone] (Fax     Reason for call:  Patient requesting a refill levothyroxine (SYNTHROID, LEVOTHROID) 125 MCG tablet

## 2017-04-24 MED ORDER — LEVOTHYROXINE SODIUM 125 MCG PO TABS: ORAL_TABLET | ORAL | 0 refills | Status: DC

## 2017-05-21 ENCOUNTER — Ambulatory Visit (INDEPENDENT_AMBULATORY_CARE_PROVIDER_SITE_OTHER): Payer: Medicare Other | Admitting: Family Medicine

## 2017-05-21 ENCOUNTER — Encounter: Payer: Self-pay | Admitting: Family Medicine

## 2017-05-21 DIAGNOSIS — I10 Essential (primary) hypertension: Secondary | ICD-10-CM

## 2017-05-21 DIAGNOSIS — E039 Hypothyroidism, unspecified: Secondary | ICD-10-CM

## 2017-05-21 DIAGNOSIS — E785 Hyperlipidemia, unspecified: Secondary | ICD-10-CM

## 2017-05-21 DIAGNOSIS — E1151 Type 2 diabetes mellitus with diabetic peripheral angiopathy without gangrene: Secondary | ICD-10-CM

## 2017-05-21 DIAGNOSIS — J069 Acute upper respiratory infection, unspecified: Secondary | ICD-10-CM | POA: Diagnosis not present

## 2017-05-21 MED ORDER — LEVOCETIRIZINE DIHYDROCHLORIDE 5 MG PO TABS: 5.0000 mg | ORAL_TABLET | Freq: Every evening | ORAL | 5 refills | Status: DC

## 2017-05-21 NOTE — Assessment & Plan Note (Signed)
Check labs today.

## 2017-05-21 NOTE — Assessment & Plan Note (Signed)
Tolerating statin, encouraged heart healthy diet, avoid trans fats, minimize simple carbs and saturated fats. Increase exercise as tolerated 

## 2017-05-21 NOTE — Assessment & Plan Note (Signed)
Well controlled, no changes to meds. Encouraged heart healthy diet such as the DASH diet and exercise as tolerated.  °

## 2017-05-21 NOTE — Progress Notes (Signed)
Patient ID: Louis Ivan., male    DOB: 06-27-1936  Age: 81 y.o. MRN: [id number]    Subjective:  Subjective  HPI Louis [name]. presents for f/u dm, cholesterol , bp , thyroid and c/o uri symptoms and runny nose.    Review of Systems  Constitutional: Negative for appetite change, chills, diaphoresis, fatigue, fever and unexpected weight change.  HENT: Positive for congestion and postnasal drip. Negative for rhinorrhea and sinus pressure.   Eyes: Negative for pain, redness and visual disturbance.  Respiratory: Negative for cough, chest tightness, shortness of breath and wheezing.   Cardiovascular: Negative for chest pain, palpitations and leg swelling.  Endocrine: Negative for cold intolerance, heat intolerance, polydipsia, polyphagia and polyuria.  Genitourinary: Negative for difficulty urinating, dysuria and frequency.  Allergic/Immunologic: Negative for environmental allergies.  Neurological: Negative for dizziness, light-headedness, numbness and headaches.    History Past Medical History:  Diagnosis Date  . Anemia   . Arthritis    Dr Amil Amen, [facility] rx- Rheumatology  . Diabetes mellitus   . Diverticulosis   . GERD (gastroesophageal reflux disease)   . Hemochromatosis     Dr Henrene Pastor  . History of kidney stones 1980   passed spont.  Marland Kitchen Hx of adenomatous colonic polyps   . Hyperlipidemia   . Hypertension   . Hypothyroidism   . IBS (irritable bowel syndrome)   . Internal hemorrhoids   . Kidney stones, calcium oxalate    1985 & 2005  . Pulmonary embolism ([place]) 2015   hosp. with Heparin & then on Xarelto- 3 months or so  . Reflux esophagitis   . SBO (small bowel obstruction) ([place]) 2004  . Spinal stenosis    Dr.Ramos  . Testosterone deficiency    111 in 2007    He has a past surgical history that includes Appendectomy; Shoulder surgery; Colonoscopy w/ polypectomy (2005); lumbar nerve block; Total shoulder replacement; Total knee arthroplasty ( 201 & 2012);  Refractive surgery (2005); Tonsillectomy and adenoidectomy; cataract (2005); TM replacement (1981); Middle ear surgery (1980); L4-5 & L5- S1 bilat facet injections (01/20/14); Joint replacement; and Lumbar laminectomy with coflex 2 level (N/A, 07/30/2016).   His family history includes Breast cancer in his maternal grandmother; Depression in his brother; Diabetes in his father and maternal aunt; Heart disease in his paternal aunt and paternal uncle; Heart failure in his mother; Stroke (age of onset: 66) in his father.He reports that he quit smoking about 36 years ago. He has never used smokeless tobacco. He reports that he drinks alcohol. He reports that he does not use drugs.  Current Outpatient Prescriptions on File Prior to Visit  Medication Sig Dispense Refill  . ALPRAZolam (XANAX) 0.5 MG tablet TAKE ONE-HALF BY MOUTH EVERY 8 TO 12 HOURS AS NEEDED 30 tablet 3  . amoxicillin (AMOXIL) 500 MG tablet Take 500 mg by mouth daily as needed (dental procedure). Reported on 10/27/2015    . aspirin EC 81 MG tablet Take 81 mg by mouth at bedtime.    Marland Kitchen atorvastatin (LIPITOR) 20 MG tablet TAKE 1 TABLET DAILY AT 6 P.M. 90 tablet 3  . CALCIUM-VITAMIN D PO Take 1 tablet by mouth daily.     . diphenhydrAMINE (BENADRYL) 25 mg capsule Take 25 mg by mouth at bedtime as needed for allergies (allergies). Reported on 10/27/2015    . fenofibrate 160 MG tablet TAKE 1 TABLET(160 MG) BY MOUTH DAILY 30 tablet 6  . fluticasone (FLONASE) 50 MCG/ACT nasal spray Place 2 sprays into  both nostrils daily. 16 g 1  . folic acid (FOLVITE) 1 MG tablet Take 1 mg by mouth daily.      Marland Kitchen glucose blood (ONETOUCH VERIO) test strip Use as instructed once daily. DX code: E11.59 100 each 1  . HYDROcodone-acetaminophen (NORCO) 10-325 MG per tablet Take 1 tablet by mouth every 4 (four) hours as needed for moderate pain or severe pain (pain).     . Lancets MISC Use as instructed once daily.  DX code: E11.59 200 each 3  . levothyroxine (SYNTHROID,  LEVOTHROID) 125 MCG tablet TAKE 1 TABLET(125 MCG) BY MOUTH DAILY 90 tablet 0  . lisinopril (PRINIVIL,ZESTRIL) 5 MG tablet Take 1 tablet (5 mg total) by mouth daily. 30 tablet 5  . methocarbamol (ROBAXIN) 500 MG tablet Take 1 tablet (500 mg total) by mouth every 6 (six) hours as needed for muscle spasms. 40 tablet 3  . methotrexate (RHEUMATREX) 2.5 MG tablet Take 15 mg by mouth once a week. Monday Night.    . multivitamin (THERAGRAN) per tablet Take 1 tablet by mouth daily.      Marland Kitchen omeprazole (PRILOSEC OTC) 20 MG tablet Take 20 mg by mouth daily.    . ondansetron (ZOFRAN) 4 MG tablet TAKE 1 TABLET EVERY 6 HOURS AS NEEDED FOR NAUSEA OR VOMITING 40 tablet 1  . pioglitazone (ACTOS) 15 MG tablet TAKE 1 TABLET DAILY 90 tablet 3  . vitamin B-12 (CYANOCOBALAMIN) 1000 MCG tablet Take 1,000 mcg by mouth daily.     No current facility-administered medications on file prior to visit.      Objective:  Objective  Physical Exam  Constitutional: He is oriented to person, place, and time. Vital signs are normal. He appears well-developed and well-nourished. He is sleeping.  HENT:  Head: Normocephalic and atraumatic.  Mouth/Throat: Oropharynx is clear and moist.  Eyes: Pupils are equal, round, and reactive to light. EOM are normal.  Neck: Normal range of motion. Neck supple. No thyromegaly present.  Cardiovascular: Normal rate and regular rhythm.   No murmur heard. Pulmonary/Chest: Effort normal and breath sounds normal. No respiratory distress. He has no wheezes. He has no rales. He exhibits no tenderness.  Musculoskeletal: He exhibits no edema or tenderness.  Neurological: He is alert and oriented to person, place, and time.  Skin: Skin is warm and dry.  Psychiatric: He has a normal mood and affect. His behavior is normal. Judgment and thought content normal.  Nursing note and vitals reviewed.  BP 120/76   Pulse (!) 103   Wt 223 lb (101.2 kg)   SpO2 96%   BMI 31.10 kg/m  Wt Readings from Last 3  Encounters:  05/21/17 223 lb (101.2 kg)  02/19/17 226 lb 12.8 oz (102.9 kg)  01/14/17 226 lb (102.5 kg)     Lab Results  Component Value Date   WBC 5.3 10/11/2016   HGB 12.2 (L) 10/11/2016   HCT 36.4 (L) 10/11/2016   PLT 298.0 10/11/2016   GLUCOSE 162 (H) 02/07/2017   CHOL 157 01/15/2017   TRIG 146.0 01/15/2017   HDL 42.40 01/15/2017   LDLDIRECT 120.0 12/02/2015   LDLCALC 85 01/15/2017   ALT 17 02/07/2017   AST 20 02/07/2017   NA 140 02/07/2017   K 4.0 02/07/2017   CL 105 02/07/2017   CREATININE 1.67 (H) 02/07/2017   BUN 23 02/07/2017   CO2 27 02/07/2017   TSH 0.51 10/11/2016   PSA 1.67 10/11/2016   INR 1.00 08/17/2014   HGBA1C 7.0 (H) 01/15/2017  MICROALBUR 3.0 (H) 12/08/2014    No results found.   Assessment & Plan:  Plan  I am having Mr. Adcock start on levocetirizine. I am also having him maintain his CALCIUM-VITAMIN D PO, folic acid, HYDROcodone-acetaminophen, diphenhydrAMINE, methotrexate, multivitamin, omeprazole, vitamin B-12, amoxicillin, aspirin EC, methocarbamol, glucose blood, ondansetron, atorvastatin, fenofibrate, fluticasone, Lancets, lisinopril, ALPRAZolam, pioglitazone, and levothyroxine.  Meds ordered this encounter  Medications  . levocetirizine (XYZAL) 5 MG tablet    Sig: Take 1 tablet (5 mg total) by mouth every evening.    Dispense:  30 tablet    Refill:  5    Problem List Items Addressed This Visit      Unprioritized   DM (diabetes mellitus) type II, controlled, with peripheral vascular disorder ([place])    hgba1c to be checked, minimize simple carbs. Increase exercise as tolerated. Continue current meds      Relevant Orders   CBC with Differential/Platelet   Comprehensive metabolic panel   Hemoglobin A1c   Essential hypertension    Well controlled, no changes to meds. Encouraged heart healthy diet such as the DASH diet and exercise as tolerated.       Relevant Orders   CBC with Differential/Platelet   HEMOCHROMATOSIS    Check  labs today      Hemochromatosis - Primary   Relevant Orders   CBC with Differential/Platelet   Ferritin   Hyperlipidemia LDL goal <70    Tolerating statin, encouraged heart healthy diet, avoid trans fats, minimize simple carbs and saturated fats. Increase exercise as tolerated      Relevant Orders   Lipid panel   Comprehensive metabolic panel   Hypothyroidism    Check labs      Relevant Orders   TSH   URI (upper respiratory infection)   Relevant Medications   levocetirizine (XYZAL) 5 MG tablet      Follow-up: Return in about 6 months (around 11/21/2017) for hypertension, hyperlipidemia, diabetes II.  Ann Held, DO

## 2017-05-21 NOTE — Assessment & Plan Note (Signed)
Check labs 

## 2017-05-21 NOTE — Assessment & Plan Note (Signed)
hgba1c to be checked, minimize simple carbs. Increase exercise as tolerated. Continue current meds  

## 2017-05-21 NOTE — Patient Instructions (Signed)

## 2017-05-22 LAB — LIPID PANEL
Cholesterol: 175 mg/dL (ref 0–200)
HDL: 42.5 mg/dL (ref >39.00)
LDL Cholesterol: 93 mg/dL (ref 0–99)
NONHDL: 132.06
TRIGLYCERIDES: 193 mg/dL — AB (ref 0.0–149.0)
Total CHOL/HDL Ratio: 4
VLDL: 38.6 mg/dL (ref 0.0–40.0)

## 2017-05-22 LAB — CBC WITH DIFFERENTIAL/PLATELET
BASOS PCT: 1.4 % (ref 0.0–3.0)
Basophils Absolute: 0.1 10*3/uL (ref 0.0–0.1)
EOS ABS: 0.1 10*3/uL (ref 0.0–0.7)
Eosinophils Relative: 2 % (ref 0.0–5.0)
HCT: 38.2 % — ABNORMAL LOW (ref 39.0–52.0)
Hemoglobin: 12.5 g/dL — ABNORMAL LOW (ref 13.0–17.0)
LYMPHS ABS: 1.6 10*3/uL (ref 0.7–4.0)
LYMPHS PCT: 25.6 % (ref 12.0–46.0)
MCHC: 32.6 g/dL (ref 30.0–36.0)
MCV: 94.9 fl (ref 78.0–100.0)
Monocytes Absolute: 0.6 10*3/uL (ref 0.1–1.0)
Monocytes Relative: 9.6 % (ref 3.0–12.0)
NEUTROS ABS: 3.8 10*3/uL (ref 1.4–7.7)
Neutrophils Relative %: 61.4 % (ref 43.0–77.0)
PLATELETS: 320 10*3/uL (ref 150.0–400.0)
RBC: 4.03 Mil/uL — ABNORMAL LOW (ref 4.22–5.81)
RDW: 16 % — AB (ref 11.5–15.5)
WBC: 6.2 10*3/uL (ref 4.0–10.5)

## 2017-05-22 LAB — COMPREHENSIVE METABOLIC PANEL
ALK PHOS: 59 U/L (ref 39–117)
ALT: 18 U/L (ref 0–53)
AST: 16 U/L (ref 0–37)
Albumin: 4.5 g/dL (ref 3.5–5.2)
BUN: 25 mg/dL — ABNORMAL HIGH (ref 6–23)
CHLORIDE: 106 meq/L (ref 96–112)
CO2: 29 meq/L (ref 19–32)
Calcium: 9.6 mg/dL (ref 8.4–10.5)
Creatinine, Ser: 1.84 mg/dL — ABNORMAL HIGH (ref 0.40–1.50)
GFR: 37.85 mL/min — AB (ref >60.00)
GLUCOSE: 127 mg/dL — AB (ref 70–99)
POTASSIUM: 4.9 meq/L (ref 3.5–5.1)
SODIUM: 143 meq/L (ref 135–145)
Total Bilirubin: 0.5 mg/dL (ref 0.2–1.2)
Total Protein: 7.1 g/dL (ref 6.0–8.3)

## 2017-05-22 LAB — TSH: TSH: 0.37 u[IU]/mL (ref 0.35–4.50)

## 2017-05-22 LAB — HEMOGLOBIN A1C: Hgb A1c MFr Bld: 7 % — ABNORMAL HIGH (ref 4.6–6.5)

## 2017-05-22 LAB — FERRITIN: Ferritin: 23.9 ng/mL (ref 22.0–322.0)

## 2017-05-23 ENCOUNTER — Ambulatory Visit: Payer: Medicare Other | Admitting: Family Medicine

## 2017-05-27 ENCOUNTER — Telehealth: Payer: Self-pay | Admitting: Family Medicine

## 2017-05-27 DIAGNOSIS — Z23 Encounter for immunization: Secondary | ICD-10-CM | POA: Diagnosis not present

## 2017-05-27 NOTE — Telephone Encounter (Signed)
Patient calling to get lab results.

## 2017-05-27 NOTE — Telephone Encounter (Signed)
Notes recorded by Ewing, Robin B, CMA on 05/27/2017 at 4:56 PM EDT Called informed the patient of results/instructions. He verbalized understanding/mailed a copy of results to him/faxed a copy to Dr. Baruch Goldmann his rheumatologist. Patient has appt in Feb. 2019 already. He had no further questions/concerns. ------  Notes recorded by Ewing, Robin B, CMA on 05/24/2017 at 3:32 PM EDT Called left message to call back. ------  Notes recorded by Ann Held, DO on 05/23/2017 at 4:46 PM EDT Cholesterol--- LDL goal < 70, HDL >40, TG < 150. Diet and exercise will increase HDL and decrease LDL and TG. Fish, Fish Oil, Flaxseed oil will also help increase the HDL and decrease Triglycerides.  Recheck labs in 6 months Dm stable Lipid, cmp, hgba1c .

## 2017-05-29 ENCOUNTER — Telehealth: Payer: Self-pay

## 2017-06-17 ENCOUNTER — Telehealth: Payer: Self-pay | Admitting: Family Medicine

## 2017-06-17 NOTE — Telephone Encounter (Signed)
Pt's spouse called in to schedule an apt. She said that he is experiencing some shortness of breath and chest pain. Tried transferring to team health, spouse declined. She said that pt isn't currently having chest pain and that he is 81 years old he always experience SOB. Scheduled pt to come in at 11:00 w/PCP.

## 2017-06-17 NOTE — Telephone Encounter (Signed)
They scheduled him to come in tomorrow. Please see message below

## 2017-06-18 ENCOUNTER — Ambulatory Visit (HOSPITAL_BASED_OUTPATIENT_CLINIC_OR_DEPARTMENT_OTHER)
Admission: RE | Admit: 2017-06-18 | Discharge: 2017-06-18 | Disposition: A | Discharge status: Home / Self Care | Payer: Medicare Other | Source: Ambulatory Visit | Attending: Family Medicine | Admitting: Family Medicine

## 2017-06-18 ENCOUNTER — Encounter (HOSPITAL_BASED_OUTPATIENT_CLINIC_OR_DEPARTMENT_OTHER): Payer: Self-pay | Admitting: *Deleted

## 2017-06-18 ENCOUNTER — Encounter: Payer: Self-pay | Admitting: Family Medicine

## 2017-06-18 ENCOUNTER — Ambulatory Visit (INDEPENDENT_AMBULATORY_CARE_PROVIDER_SITE_OTHER): Payer: Medicare Other | Admitting: Family Medicine

## 2017-06-18 ENCOUNTER — Inpatient Hospital Stay (HOSPITAL_BASED_OUTPATIENT_CLINIC_OR_DEPARTMENT_OTHER)
Admission: EM | Admit: 2017-06-18 | Discharge: 2017-06-22 | DRG: 175 | Disposition: A | Discharge status: Home / Self Care | Payer: Medicare Other | Attending: Internal Medicine | Admitting: Internal Medicine

## 2017-06-18 ENCOUNTER — Other Ambulatory Visit: Payer: Self-pay | Admitting: Family Medicine

## 2017-06-18 ENCOUNTER — Telehealth: Payer: Self-pay | Admitting: *Deleted

## 2017-06-18 ENCOUNTER — Other Ambulatory Visit: Payer: Self-pay

## 2017-06-18 VITALS — BP 118/60 | HR 91 | Temp 97.9°F | Ht 71.0 in | Wt 221.4 lb

## 2017-06-18 DIAGNOSIS — Z96612 Presence of left artificial shoulder joint: Secondary | ICD-10-CM | POA: Diagnosis present

## 2017-06-18 DIAGNOSIS — R0602 Shortness of breath: Secondary | ICD-10-CM

## 2017-06-18 DIAGNOSIS — D638 Anemia in other chronic diseases classified elsewhere: Secondary | ICD-10-CM | POA: Diagnosis present

## 2017-06-18 DIAGNOSIS — I2602 Saddle embolus of pulmonary artery with acute cor pulmonale: Principal | ICD-10-CM | POA: Diagnosis present

## 2017-06-18 DIAGNOSIS — I82441 Acute embolism and thrombosis of right tibial vein: Secondary | ICD-10-CM | POA: Diagnosis present

## 2017-06-18 DIAGNOSIS — I129 Hypertensive chronic kidney disease with stage 1 through stage 4 chronic kidney disease, or unspecified chronic kidney disease: Secondary | ICD-10-CM | POA: Diagnosis present

## 2017-06-18 DIAGNOSIS — E1122 Type 2 diabetes mellitus with diabetic chronic kidney disease: Secondary | ICD-10-CM | POA: Diagnosis present

## 2017-06-18 DIAGNOSIS — R001 Bradycardia, unspecified: Secondary | ICD-10-CM | POA: Diagnosis present

## 2017-06-18 DIAGNOSIS — Z91018 Allergy to other foods: Secondary | ICD-10-CM | POA: Diagnosis not present

## 2017-06-18 DIAGNOSIS — Z96653 Presence of artificial knee joint, bilateral: Secondary | ICD-10-CM | POA: Diagnosis present

## 2017-06-18 DIAGNOSIS — I272 Pulmonary hypertension, unspecified: Secondary | ICD-10-CM | POA: Diagnosis present

## 2017-06-18 DIAGNOSIS — Z91013 Allergy to seafood: Secondary | ICD-10-CM

## 2017-06-18 DIAGNOSIS — K219 Gastro-esophageal reflux disease without esophagitis: Secondary | ICD-10-CM | POA: Diagnosis present

## 2017-06-18 DIAGNOSIS — Z79899 Other long term (current) drug therapy: Secondary | ICD-10-CM | POA: Diagnosis not present

## 2017-06-18 DIAGNOSIS — I700 Atherosclerosis of aorta: Secondary | ICD-10-CM

## 2017-06-18 DIAGNOSIS — R079 Chest pain, unspecified: Secondary | ICD-10-CM

## 2017-06-18 DIAGNOSIS — E1151 Type 2 diabetes mellitus with diabetic peripheral angiopathy without gangrene: Secondary | ICD-10-CM | POA: Diagnosis not present

## 2017-06-18 DIAGNOSIS — J439 Emphysema, unspecified: Secondary | ICD-10-CM | POA: Insufficient documentation

## 2017-06-18 DIAGNOSIS — N183 Chronic kidney disease, stage 3 unspecified: Secondary | ICD-10-CM | POA: Diagnosis present

## 2017-06-18 DIAGNOSIS — K589 Irritable bowel syndrome without diarrhea: Secondary | ICD-10-CM | POA: Diagnosis present

## 2017-06-18 DIAGNOSIS — Z87891 Personal history of nicotine dependence: Secondary | ICD-10-CM

## 2017-06-18 DIAGNOSIS — Z888 Allergy status to other drugs, medicaments and biological substances status: Secondary | ICD-10-CM

## 2017-06-18 DIAGNOSIS — I10 Essential (primary) hypertension: Secondary | ICD-10-CM | POA: Diagnosis present

## 2017-06-18 DIAGNOSIS — I2699 Other pulmonary embolism without acute cor pulmonale: Secondary | ICD-10-CM | POA: Diagnosis present

## 2017-06-18 DIAGNOSIS — I82811 Embolism and thrombosis of superficial veins of right lower extremities: Secondary | ICD-10-CM | POA: Diagnosis present

## 2017-06-18 DIAGNOSIS — M069 Rheumatoid arthritis, unspecified: Secondary | ICD-10-CM | POA: Diagnosis present

## 2017-06-18 DIAGNOSIS — Z7982 Long term (current) use of aspirin: Secondary | ICD-10-CM

## 2017-06-18 DIAGNOSIS — E039 Hypothyroidism, unspecified: Secondary | ICD-10-CM | POA: Diagnosis present

## 2017-06-18 DIAGNOSIS — E785 Hyperlipidemia, unspecified: Secondary | ICD-10-CM | POA: Diagnosis present

## 2017-06-18 DIAGNOSIS — I82431 Acute embolism and thrombosis of right popliteal vein: Secondary | ICD-10-CM | POA: Diagnosis present

## 2017-06-18 DIAGNOSIS — Z86711 Personal history of pulmonary embolism: Secondary | ICD-10-CM | POA: Diagnosis not present

## 2017-06-18 DIAGNOSIS — E118 Type 2 diabetes mellitus with unspecified complications: Secondary | ICD-10-CM | POA: Diagnosis present

## 2017-06-18 DIAGNOSIS — I2692 Saddle embolus of pulmonary artery without acute cor pulmonale: Secondary | ICD-10-CM | POA: Diagnosis not present

## 2017-06-18 LAB — ECHOCARDIOGRAM COMPLETE
HEIGHTINCHES: 71 in
WEIGHTICAEL: 3542.4 [oz_av]

## 2017-06-18 LAB — CBC WITH DIFFERENTIAL/PLATELET
BASOS ABS: 0.1 10*3/uL (ref 0.0–0.1)
BASOS ABS: 0 10*3/uL (ref 0.0–0.1)
BASOS PCT: 0.9 % (ref 0.0–3.0)
Basophils Relative: 0 %
EOS ABS: 0.3 10*3/uL (ref 0.0–0.7)
EOS ABS: 0.3 10*3/uL (ref 0.0–0.7)
Eosinophils Relative: 4.4 % (ref 0.0–5.0)
Eosinophils Relative: 4 %
HCT: 35.8 % — ABNORMAL LOW (ref 39.0–52.0)
HEMATOCRIT: 37.4 % — AB (ref 39.0–52.0)
HEMOGLOBIN: 11.7 g/dL — AB (ref 13.0–17.0)
Hemoglobin: 12 g/dL — ABNORMAL LOW (ref 13.0–17.0)
LYMPHS ABS: 1.4 10*3/uL (ref 0.7–4.0)
LYMPHS ABS: 2.1 10*3/uL (ref 0.7–4.0)
LYMPHS PCT: 22.5 % (ref 12.0–46.0)
LYMPHS PCT: 31 %
MCH: 30.5 pg (ref 26.0–34.0)
MCHC: 32.1 g/dL (ref 30.0–36.0)
MCHC: 32.7 g/dL (ref 30.0–36.0)
MCV: 93.9 fl (ref 78.0–100.0)
MCV: 93.5 fL (ref 78.0–100.0)
MONO ABS: 0.5 10*3/uL (ref 0.1–1.0)
Monocytes Absolute: 0.7 10*3/uL (ref 0.1–1.0)
Monocytes Relative: 8.7 % (ref 3.0–12.0)
Monocytes Relative: 10 %
NEUTROS ABS: 3.9 10*3/uL (ref 1.4–7.7)
NEUTROS PCT: 63.5 % (ref 43.0–77.0)
NEUTROS PCT: 55 %
Neutro Abs: 3.7 10*3/uL (ref 1.7–7.7)
PLATELETS: 412 10*3/uL — AB (ref 150.0–400.0)
PLATELETS: 399 10*3/uL (ref 150–400)
RBC: 3.98 Mil/uL — ABNORMAL LOW (ref 4.22–5.81)
RBC: 3.83 MIL/uL — AB (ref 4.22–5.81)
RDW: 15.6 % — AB (ref 11.5–15.5)
RDW: 15.2 % (ref 11.5–15.5)
WBC: 6.2 10*3/uL (ref 4.0–10.5)
WBC: 6.8 10*3/uL (ref 4.0–10.5)

## 2017-06-18 LAB — BASIC METABOLIC PANEL
BUN: 23 mg/dL (ref 6–23)
CALCIUM: 9.2 mg/dL (ref 8.4–10.5)
CO2: 26 meq/L (ref 19–32)
CREATININE: 1.87 mg/dL — AB (ref 0.40–1.50)
Chloride: 107 mEq/L (ref 96–112)
GFR: 37.14 mL/min — ABNORMAL LOW (ref >60.00)
GLUCOSE: 148 mg/dL — AB (ref 70–99)
Potassium: 4.2 mEq/L (ref 3.5–5.1)
Sodium: 141 mEq/L (ref 135–145)

## 2017-06-18 LAB — URINALYSIS, ROUTINE W REFLEX MICROSCOPIC
Bilirubin Urine: NEGATIVE
GLUCOSE, UA: NEGATIVE mg/dL
KETONES UR: NEGATIVE mg/dL
LEUKOCYTES UA: NEGATIVE
NITRITE: NEGATIVE
PROTEIN: NEGATIVE mg/dL
Specific Gravity, Urine: 1.01 (ref 1.005–1.030)
pH: 7 (ref 5.0–8.0)

## 2017-06-18 LAB — BRAIN NATRIURETIC PEPTIDE: B NATRIURETIC PEPTIDE 5: 64.8 pg/mL (ref 0.0–100.0)

## 2017-06-18 LAB — PROTIME-INR
INR: 1.02
PROTHROMBIN TIME: 13.3 s (ref 11.4–15.2)

## 2017-06-18 LAB — COMPREHENSIVE METABOLIC PANEL
ALT: 16 U/L — ABNORMAL LOW (ref 17–63)
AST: 27 U/L (ref 15–41)
Albumin: 4 g/dL (ref 3.5–5.0)
Alkaline Phosphatase: 64 U/L (ref 38–126)
Anion gap: 9 (ref 5–15)
BUN: 22 mg/dL — ABNORMAL HIGH (ref 6–20)
CHLORIDE: 104 mmol/L (ref 101–111)
CO2: 25 mmol/L (ref 22–32)
Calcium: 8.8 mg/dL — ABNORMAL LOW (ref 8.9–10.3)
Creatinine, Ser: 1.88 mg/dL — ABNORMAL HIGH (ref 0.61–1.24)
GFR calc non Af Amer: 32 mL/min — ABNORMAL LOW (ref >60)
GFR, EST AFRICAN AMERICAN: 38 mL/min — AB (ref >60)
Glucose, Bld: 105 mg/dL — ABNORMAL HIGH (ref 65–99)
POTASSIUM: 3.8 mmol/L (ref 3.5–5.1)
SODIUM: 138 mmol/L (ref 135–145)
Total Bilirubin: 0.6 mg/dL (ref 0.3–1.2)
Total Protein: 7.7 g/dL (ref 6.5–8.1)

## 2017-06-18 LAB — TROPONIN I: TNIDX: 0.01 ug/l (ref 0.00–0.06)

## 2017-06-18 LAB — URINALYSIS, MICROSCOPIC (REFLEX): WBC, UA: NONE SEEN WBC/hpf (ref 0–5)

## 2017-06-18 LAB — OCCULT BLOOD X 1 CARD TO LAB, STOOL: FECAL OCCULT BLD: NEGATIVE

## 2017-06-18 LAB — D-DIMER, QUANTITATIVE: D-Dimer, Quant: 5.38 mcg/mL FEU — ABNORMAL HIGH (ref <0.50)

## 2017-06-18 MED ORDER — ONDANSETRON HCL 4 MG/2ML IJ SOLN
INTRAMUSCULAR | Status: AC
  Filled 2017-06-18: qty 2

## 2017-06-18 MED ORDER — IOPAMIDOL (ISOVUE-370) INJECTION 76%
80.0000 mL | Freq: Once | INTRAVENOUS | Status: AC | PRN
  Administered 2017-06-18: 80 mL via INTRAVENOUS

## 2017-06-18 MED ORDER — ONDANSETRON HCL 4 MG/2ML IJ SOLN
4.0000 mg | Freq: Once | INTRAMUSCULAR | Status: AC
  Administered 2017-06-18: 4 mg via INTRAVENOUS

## 2017-06-18 MED ORDER — HEPARIN (PORCINE) IN NACL 100-0.45 UNIT/ML-% IJ SOLN
1400.0000 [IU]/h | INTRAMUSCULAR | Status: DC
  Administered 2017-06-18 – 2017-06-19 (×2): 1400 [IU]/h via INTRAVENOUS
  Filled 2017-06-18 (×2): qty 250

## 2017-06-18 MED ORDER — HEPARIN BOLUS VIA INFUSION
5500.0000 [IU] | Freq: Once | INTRAVENOUS | Status: AC
  Administered 2017-06-18: 5500 [IU] via INTRAVENOUS

## 2017-06-18 NOTE — Progress Notes (Signed)
Patient ID: Louis [name]., male    DOB: 1936-01-10  Age: 81 y.o. MRN: [id number]    Subjective:  Subjective  HPI Louis [name]. presents for sob and occassional cp.  X 3 weeks -- pt had cp 1x but sob has been worsening over last 3 weeks.  Pt with hx pulm embolism in the past.     Review of Systems  Constitutional: Positive for fatigue. Negative for appetite change, diaphoresis and unexpected weight change.  Eyes: Negative for pain, redness and visual disturbance.  Respiratory: Positive for shortness of breath. Negative for cough, chest tightness and wheezing.   Cardiovascular: Positive for chest pain. Negative for palpitations and leg swelling.  Endocrine: Negative for cold intolerance, heat intolerance, polydipsia, polyphagia and polyuria.  Genitourinary: Negative for difficulty urinating, dysuria and frequency.  Neurological: Negative for dizziness, light-headedness, numbness and headaches.    History Past Medical History:  Diagnosis Date  . Anemia   . Arthritis    Dr Amil Amen, [facility] rx- Rheumatology  . Diabetes mellitus   . Diverticulosis   . GERD (gastroesophageal reflux disease)   . Hemochromatosis     Dr Henrene Pastor  . History of kidney stones 1980   passed spont.  Marland Kitchen Hx of adenomatous colonic polyps   . Hyperlipidemia   . Hypertension   . Hypothyroidism   . IBS (irritable bowel syndrome)   . Internal hemorrhoids   . Kidney stones, calcium oxalate    1985 & 2005  . Pulmonary embolism ([place]) 2015   hosp. with Heparin & then on Xarelto- 3 months or so  . Reflux esophagitis   . SBO (small bowel obstruction) ([place]) 2004  . Spinal stenosis    Dr.Ramos  . Testosterone deficiency    111 in 2007    He has a past surgical history that includes Appendectomy; Shoulder surgery; Colonoscopy w/ polypectomy (2005); lumbar nerve block; Total shoulder replacement; Total knee arthroplasty ( 201 & 2012); Refractive surgery (2005); Tonsillectomy and adenoidectomy; cataract (2005);  TM replacement (1981); Middle ear surgery (1980); L4-5 & L5- S1 bilat facet injections (01/20/14); Joint replacement; and Lumbar laminectomy with coflex 2 level (N/A, 07/30/2016).   His family history includes Breast cancer in his maternal grandmother; Depression in his brother; Diabetes in his father and maternal aunt; Heart disease in his paternal aunt and paternal uncle; Heart failure in his mother; Stroke (age of onset: 90) in his father.He reports that he quit smoking about 36 years ago. He has never used smokeless tobacco. He reports that he drinks alcohol. He reports that he does not use drugs.  No current facility-administered medications on file prior to visit.    Current Outpatient Prescriptions on File Prior to Visit  Medication Sig Dispense Refill  . ALPRAZolam (XANAX) 0.5 MG tablet TAKE ONE-HALF BY MOUTH EVERY 8 TO 12 HOURS AS NEEDED 30 tablet 3  . aspirin EC 81 MG tablet Take 81 mg by mouth at bedtime.    Marland Kitchen atorvastatin (LIPITOR) 20 MG tablet TAKE 1 TABLET DAILY AT 6 P.M. 90 tablet 3  . CALCIUM-VITAMIN D PO Take 1 tablet by mouth daily.     . diphenhydrAMINE (BENADRYL) 25 mg capsule Take 25 mg by mouth at bedtime as needed for allergies (allergies). Reported on 10/27/2015    . fenofibrate 160 MG tablet TAKE 1 TABLET(160 MG) BY MOUTH DAILY 30 tablet 6  . fluticasone (FLONASE) 50 MCG/ACT nasal spray Place 2 sprays into both nostrils daily. 16 g 1  . folic  acid (FOLVITE) 1 MG tablet Take 1 mg by mouth daily.      Marland Kitchen glucose blood (ONETOUCH VERIO) test strip Use as instructed once daily. DX code: E11.59 100 each 1  . HYDROcodone-acetaminophen (NORCO) 10-325 MG per tablet Take 1 tablet by mouth every 4 (four) hours as needed for moderate pain or severe pain (pain).     . Lancets MISC Use as instructed once daily.  DX code: E11.59 200 each 3  . levocetirizine (XYZAL) 5 MG tablet Take 1 tablet (5 mg total) by mouth every evening. 30 tablet 5  . levothyroxine (SYNTHROID, LEVOTHROID) 125 MCG  tablet TAKE 1 TABLET(125 MCG) BY MOUTH DAILY 90 tablet 0  . lisinopril (PRINIVIL,ZESTRIL) 5 MG tablet Take 1 tablet (5 mg total) by mouth daily. 30 tablet 5  . methocarbamol (ROBAXIN) 500 MG tablet Take 1 tablet (500 mg total) by mouth every 6 (six) hours as needed for muscle spasms. 40 tablet 3  . methotrexate (RHEUMATREX) 2.5 MG tablet Take 15 mg by mouth once a week. Monday Night.    . multivitamin (THERAGRAN) per tablet Take 1 tablet by mouth daily.      Marland Kitchen omeprazole (PRILOSEC OTC) 20 MG tablet Take 20 mg by mouth daily.    . ondansetron (ZOFRAN) 4 MG tablet TAKE 1 TABLET EVERY 6 HOURS AS NEEDED FOR NAUSEA OR VOMITING 40 tablet 1  . pioglitazone (ACTOS) 15 MG tablet TAKE 1 TABLET DAILY 90 tablet 3  . vitamin B-12 (CYANOCOBALAMIN) 1000 MCG tablet Take 1,000 mcg by mouth daily.       Objective:  Objective  Physical Exam  Constitutional: He is oriented to person, place, and time. Vital signs are normal. He appears well-developed and well-nourished. He is sleeping.  HENT:  Head: Normocephalic and atraumatic.  Mouth/Throat: Oropharynx is clear and moist.  Eyes: Pupils are equal, round, and reactive to light. EOM are normal.  Neck: Normal range of motion. Neck supple. No thyromegaly present.  Cardiovascular: Normal rate and regular rhythm.   No murmur heard. Pulmonary/Chest: Effort normal and breath sounds normal. No respiratory distress. He has no wheezes. He has no rales. He exhibits no tenderness.  Musculoskeletal: He exhibits no edema or tenderness.  Neurological: He is alert and oriented to person, place, and time.  Skin: Skin is warm and dry.  Psychiatric: He has a normal mood and affect. His behavior is normal. Judgment and thought content normal.  Nursing note and vitals reviewed.  BP 118/60 (BP Location: Right Arm, Patient Position: Sitting, Cuff Size: Normal)   Pulse 91   Temp 97.9 F (36.6 C) (Oral)   Ht 5\' 11"  (1.803 m)   Wt 221 lb 6.4 oz (100.4 kg)   SpO2 96%   BMI  30.88 kg/m ---- pulse ox dropped to 92 % with walking  Wt Readings from Last 3 Encounters:  06/18/17 221 lb (100.2 kg)  06/18/17 221 lb 6.4 oz (100.4 kg)  05/21/17 223 lb (101.2 kg)     Lab Results  Component Value Date   WBC 6.8 06/18/2017   HGB 11.7 (L) 06/18/2017   HCT 35.8 (L) 06/18/2017   PLT 399 06/18/2017   GLUCOSE 105 (H) 06/18/2017   CHOL 175 05/21/2017   TRIG 193.0 (H) 05/21/2017   HDL 42.50 05/21/2017   LDLDIRECT 120.0 12/02/2015   LDLCALC 93 05/21/2017   ALT 16 (L) 06/18/2017   AST 27 06/18/2017   NA 138 06/18/2017   K 3.8 06/18/2017   CL 104 06/18/2017  CREATININE 1.88 (H) 06/18/2017   BUN 22 (H) 06/18/2017   CO2 25 06/18/2017   TSH 0.37 05/21/2017   PSA 1.67 10/11/2016   INR 1.02 06/18/2017   HGBA1C 7.0 (H) 05/21/2017   MICROALBUR 3.0 (H) 12/08/2014  ekg --  No acute changes   No results found.   Assessment & Plan:  Plan  I am having Louis [name] maintain his CALCIUM-VITAMIN D PO, folic acid, HYDROcodone-acetaminophen, diphenhydrAMINE, methotrexate, multivitamin, omeprazole, vitamin B-12, aspirin EC, methocarbamol, glucose blood, ondansetron, atorvastatin, fenofibrate, fluticasone, Lancets, lisinopril, ALPRAZolam, pioglitazone, levothyroxine, levocetirizine, and doxycycline.  Meds ordered this encounter  Medications  . doxycycline (VIBRA-TABS) 100 MG tablet    Sig: TK 1 T PO  D TAT WITH RESTRICTIONS ON DAIRY PRODUCTS    Refill:  0    Problem List Items Addressed This Visit      Unprioritized   SOB (shortness of breath)    PO dropped to 91% end of 2nd lap around office        Relevant Orders   Troponin I (Completed)   D-Dimer, Quantitative (Completed)   DG Chest 2 View (Completed)   Basic metabolic panel (Completed)   ECHOCARDIOGRAM COMPLETE (Completed)   CBC with Differential/Platelet (Completed)    Other Visit Diagnoses    Chest pain, unspecified type    -  Primary   Relevant Orders   EKG 12-Lead (Completed)   Troponin I (Completed)     D-Dimer, Quantitative (Completed)   Basic metabolic panel (Completed)   ECHOCARDIOGRAM COMPLETE (Completed)   CBC with Differential/Platelet (Completed)    If chest pain returns --- GO TO ER!  Pt understands.    Follow-up: Return in about 3 months (around 09/17/2017), or if symptoms worsen or fail to improve, for hypertension, hyperlipidemia, diabetes II.  Ann Held, DO

## 2017-06-18 NOTE — ED Triage Notes (Signed)
Pt sent here from PMD office with elevaterd d dimer and CTA with BIl PE results

## 2017-06-18 NOTE — Telephone Encounter (Signed)
noted 

## 2017-06-18 NOTE — Progress Notes (Signed)
ANTICOAGULATION CONSULT NOTE - Initial Consult  Pharmacy Consult for heparin Indication: bilateral PE  Allergies  Allergen Reactions  . Shellfish Allergy     ANGIOEDEMA  . Garlic Nausea And Vomiting    Patient Measurements: Height: 5\' 11"  (180.3 cm) Weight: 221 lb (100.2 kg) IBW/kg (Calculated) : 75.3 Heparin Dosing Weight: 96kg  Vital Signs: Temp: 98.3 F (36.8 C) (09/18 1727) Temp Source: Oral (09/18 1727) BP: 136/67 (09/18 1830) Pulse Rate: 63 (09/18 1830)  Labs:  Recent Labs  06/18/17 1216 06/18/17 1751 06/18/17 1752  HGB 12.0* 11.7*  --   HCT 37.4* 35.8*  --   PLT 412.0* 399  --   LABPROT  --  13.3  --   INR  --  1.02  --   CREATININE 1.87* 1.88*  --   TROPONINI  --   --  <0.03    Estimated Creatinine Clearance: 38.4 mL/min (A) (by C-G formula based on SCr of 1.88 mg/dL (H)).   Medical History: Past Medical History:  Diagnosis Date  . Anemia   . Arthritis    Dr Amil Amen, [facility] rx- Rheumatology  . Diabetes mellitus   . Diverticulosis   . GERD (gastroesophageal reflux disease)   . Hemochromatosis     Dr Henrene Pastor  . History of kidney stones 1980   passed spont.  Marland Kitchen Hx of adenomatous colonic polyps   . Hyperlipidemia   . Hypertension   . Hypothyroidism   . IBS (irritable bowel syndrome)   . Internal hemorrhoids   . Kidney stones, calcium oxalate    1985 & 2005  . Pulmonary embolism ([place]) 2015   hosp. with Heparin & then on Xarelto- 3 months or so  . Reflux esophagitis   . SBO (small bowel obstruction) ([place]) 2004  . Spinal stenosis    Dr.Ramos  . Testosterone deficiency    111 in 2007    Medications:  Infusions:  . heparin      Assessment: 43 yom presented to the ED with SOB and acute bilateral PE with right heart strain. To start IV heparin. H/H are slightly low but platelets are WNL. He was on xarelto in the past but currently no anticoagulation.   Goal of Therapy:  Heparin level 0.3-0.7 units/ml Monitor platelets by anticoagulation  protocol: Yes   Plan:  Heparin bolus 5500 units IV x 1 Heparin gtt 1400 units/hr Check an 8 hr heparin level Daily heparin level and CBC  Jerel Sardina, Rande Lawman 06/18/2017,6:59 PM

## 2017-06-18 NOTE — Progress Notes (Signed)
Called for transfer of this patient from [facility].  Patient is a 81yo with h/o PE presenting with B PE with right heart strain.  SOB/DOE x 3 weeks.  Hemodynamically stable.  Heme negative.  Started on Heparin drip.  Will place patient in SDU.  Dr. Leonette Monarch will check with PCCM to see if they think he needs evaluation for thrombolysis given presence of "at least submassive PE."  Carlyon Shadow, M.D.

## 2017-06-18 NOTE — ED Notes (Signed)
Pt given mac and cheese and diet coke. Pt updated that he had no assigned bed at this time. Pt resting comfortably on stretcher in NAD. Wife at bedside

## 2017-06-18 NOTE — Telephone Encounter (Signed)
Per verbal from PCP, Louis [name], pt needs to come in now for CT angio to r/o blood clot as pt's d-dimer came back elevated. Notified pt and he will go straight to radiology now. They have been notified.

## 2017-06-18 NOTE — Telephone Encounter (Signed)
Pt seen today

## 2017-06-18 NOTE — ED Notes (Signed)
Louis [name]-pts wife, [phone]

## 2017-06-18 NOTE — Patient Instructions (Signed)
Nonspecific Chest Pain °Chest pain can be caused by many different conditions. There is always a chance that your pain could be related to something serious, such as a heart attack or a blood clot in your lungs. Chest pain can also be caused by conditions that are not life-threatening. If you have chest pain, it is very important to follow up with your health care provider. °What are the causes? °Causes of this condition include: °· Heartburn. °· Pneumonia or bronchitis. °· Anxiety or stress. °· Inflammation around your heart (pericarditis) or lung (pleuritis or pleurisy). °· A blood clot in your lung. °· A collapsed lung (pneumothorax). This can develop suddenly on its own (spontaneous pneumothorax) or from trauma to the chest. °· Shingles infection (varicella-zoster virus). °· Heart attack. °· Damage to the bones, muscles, and cartilage that make up your chest wall. This can include: °? Bruised bones due to injury. °? Strained muscles or cartilage due to frequent or repeated coughing or overwork. °? Fracture to one or more ribs. °? Sore cartilage due to inflammation (costochondritis). ° °What increases the risk? °Risk factors for this condition may include: °· Activities that increase your risk for trauma or injury to your chest. °· Respiratory infections or conditions that cause frequent coughing. °· Medical conditions or overeating that can cause heartburn. °· Heart disease or family history of heart disease. °· Conditions or health behaviors that increase your risk of developing a blood clot. °· Having had chicken pox (varicella zoster). ° °What are the signs or symptoms? °Chest pain can feel like: °· Burning or tingling on the surface of your chest or deep in your chest. °· Crushing, pressure, aching, or squeezing pain. °· Dull or sharp pain that is worse when you move, cough, or take a deep breath. °· Pain that is also felt in your back, neck, shoulder, or arm, or pain that spreads to any of these  areas. ° °Your chest pain may come and go, or it may stay constant. °How is this diagnosed? °Lab tests or other studies may be needed to find the cause of your pain. Your health care provider may have you take a test called an ECG (electrocardiogram). An ECG records your heartbeat patterns at the time the test is performed. You may also have other tests, such as: °· Transthoracic echocardiogram (TTE). In this test, sound waves are used to create a picture of the heart structures and to look at how blood flows through your heart. °· Transesophageal echocardiogram (TEE). This is a more advanced imaging test that takes images from inside your body. It allows your health care provider to see your heart in finer detail. °· Cardiac monitoring. This allows your health care provider to monitor your heart rate and rhythm in real time. °· Holter monitor. This is a portable device that records your heartbeat and can help to diagnose abnormal heartbeats. It allows your health care provider to track your heart activity for several days, if needed. °· Stress tests. These can be done through exercise or by taking medicine that makes your heart beat more quickly. °· Blood tests. °· Other imaging tests. ° °How is this treated? °Treatment depends on what is causing your chest pain. Treatment may include: °· Medicines. These may include: °? Acid blockers for heartburn. °? Anti-inflammatory medicine. °? Pain medicine for inflammatory conditions. °? Antibiotic medicine, if an infection is present. °? Medicines to dissolve blood clots. °? Medicines to treat coronary artery disease (CAD). °· Supportive care for conditions that   do not require medicines. This may include: °? Resting. °? Applying heat or cold packs to injured areas. °? Limiting activities until pain decreases. ° °Follow these instructions at home: °Medicines °· If you were prescribed an antibiotic, take it as told by your health care provider. Do not stop taking the  antibiotic even if you start to feel better. °· Take over-the-counter and prescription medicines only as told by your health care provider. °Lifestyle °· Do not use any products that contain nicotine or tobacco, such as cigarettes and e-cigarettes. If you need help quitting, ask your health care provider. °· Do not drink alcohol. °· Make lifestyle changes as directed by your health care provider. These may include: °? Getting regular exercise. Ask your health care provider to suggest some activities that are safe for you. °? Eating a heart-healthy diet. A registered dietitian can help you to learn healthy eating options. °? Maintaining a healthy weight. °? Managing diabetes, if necessary. °? Reducing stress, such as with yoga or relaxation techniques. °General instructions °· Avoid any activities that bring on chest pain. °· If heartburn is the cause for your chest pain, raise (elevate) the head of your bed about 6 inches (15 cm) by putting blocks under the legs. Sleeping with more pillows does not effectively relieve heartburn because it only changes the position of your head. °· Keep all follow-up visits as told by your health care provider. This is important. This includes any further testing if your chest pain does not go away. °Contact a health care provider if: °· Your chest pain does not go away. °· You have a rash with blisters on your chest. °· You have a fever. °· You have chills. °Get help right away if: °· Your chest pain is worse. °· You have a cough that gets worse, or you cough up blood. °· You have severe pain in your abdomen. °· You have severe weakness. °· You faint. °· You have sudden, unexplained chest discomfort. °· You have sudden, unexplained discomfort in your arms, back, neck, or jaw. °· You have shortness of breath at any time. °· You suddenly start to sweat, or your skin gets clammy. °· You feel nauseous or you vomit. °· You suddenly feel light-headed or dizzy. °· Your heart begins to beat  quickly, or it feels like it is skipping beats. °These symptoms may represent a serious problem that is an emergency. Do not wait to see if the symptoms will go away. Get medical help right away. Call your local emergency services (911 in the [place] Do not drive yourself to the hospital. °This information is not intended to replace advice given to you by your health care provider. Make sure you discuss any questions you have with your health care provider. °Document Released: 06/27/2005 Document Revised: 06/11/2016 Document Reviewed: 06/11/2016 °Elsevier Interactive Patient Education © 2017 [employer] ° °

## 2017-06-18 NOTE — Telephone Encounter (Signed)
Will close this message 

## 2017-06-18 NOTE — Assessment & Plan Note (Signed)
PO dropped to 91% end of 2nd lap around office

## 2017-06-18 NOTE — ED Provider Notes (Signed)
[name] Provider Note   CSN: [id number] Arrival date & time: 06/18/17  1723     History   Chief Complaint Chief Complaint  Patient presents with  . PE    HPI Louis [name]. is a 81 y.o. male.  HPI 81 year old male with an extensive past medical history listed below including prior pulmonary emboli that was treated with Xarelto. Currently not anticoagulated. Presents from primary care provider for a newly diagnosed bilateral PEs with right heart strain. Patient reports that for several weeks he's been having some intermittent chest pain with worsening dyspnea on exertion. Patient also with orthopnea. No other alleviating or aggravating factors. No associated fevers, cough. No leg pain or swelling. He has any other physical complaints.  Patient endorses history of diverticulosis but denies any melena, hematochezia.   Past Medical History:  Diagnosis Date  . Anemia   . Arthritis    Dr Amil Amen, [place] rx- Rheumatology  . Diabetes mellitus   . Diverticulosis   . GERD (gastroesophageal reflux disease)   . Hemochromatosis     Dr Henrene Pastor  . History of kidney stones 1980   passed spont.  Marland Kitchen Hx of adenomatous colonic polyps   . Hyperlipidemia   . Hypertension   . Hypothyroidism   . IBS (irritable bowel syndrome)   . Internal hemorrhoids   . Kidney stones, calcium oxalate    1985 & 2005  . Pulmonary embolism ([place]) 2015   hosp. with Heparin & then on Xarelto- 3 months or so  . Reflux esophagitis   . SBO (small bowel obstruction) ([place]) 2004  . Spinal stenosis    Dr.Ramos  . Testosterone deficiency    111 in 2007    Patient Active Problem List   Diagnosis Date Noted  . SOB (shortness of breath) 06/18/2017  . Pulmonary embolism ([place]) 06/18/2017  . Hemochromatosis 05/21/2017  . URI (upper respiratory infection) 05/21/2017  . Lumbar stenosis with neurogenic claudication 07/30/2016  . CN (constipation)   . History of pulmonary embolism 08/17/2014  .  Abdominal pain 08/17/2014  . Nephrolithiasis 10/09/2013  . GERD (gastroesophageal reflux disease) 05/23/2012  . Unspecified vitamin D deficiency 03/25/2011  . DM (diabetes mellitus) type II, controlled, with peripheral vascular disorder ([place]) 10/09/2010  . Anxiety state 10/09/2010  . DIVERTICULOSIS-COLON 10/09/2010  . Hypothyroidism 07/07/2009  . DEGENERATIVE JOINT DISEASE 07/07/2009  . Essential hypertension 05/27/2008  . Hyperlipidemia LDL goal <70 10/16/2007  . TESTOSTERONE DEFICIENCY 05/14/2007  . ANEMIA-NOS 02/18/2007  . REFLUX ESOPHAGITIS 02/18/2007  . Spinal stenosis of lumbar region 02/18/2007  . HEMOCHROMATOSIS 02/18/2007    Past Surgical History:  Procedure Laterality Date  . APPENDECTOMY    . cataract  2005   bilaterally  . COLONOSCOPY W/ POLYPECTOMY  2005   tubular adenoma  . JOINT REPLACEMENT     L- shoulder, x2, replacement on both knees   . L4-5 & L5- S1 bilat facet injections  01/20/14    Dr Nelva Bush  . LUMBAR LAMINECTOMY WITH COFLEX 2 LEVEL N/A 07/30/2016   Procedure: Lumbar three- four Lumbar four - five Laminectomy and foraminotomy;  Surgeon: Kristeen Miss, MD;  Location: [facility];  Service: Neurosurgery;  Laterality: N/A;  L3-4 L4-5 Laminectomy with coflex  . lumbar nerve block      X 14; Dr Nelva Bush  . MIDDLE EAR SURGERY  1980   Teflon tack   . REFRACTIVE SURGERY  2005   SE Ophth  . SHOULDER SURGERY      2  on L ; 3 on R  . TM replacement  1981  . TONSILLECTOMY AND ADENOIDECTOMY    . TOTAL KNEE ARTHROPLASTY   201 & 2012    bilaterally;Dr Alusio  . TOTAL SHOULDER REPLACEMENT     L       Home Medications    Prior to Admission medications   Medication Sig Start Date End Date Taking? Authorizing Provider  ALPRAZolam Duanne Moron) 0.5 MG tablet TAKE ONE-HALF BY MOUTH EVERY 8 TO 12 HOURS AS NEEDED 02/19/17   Carollee Herter, Alferd Apa, DO  aspirin EC 81 MG tablet Take 81 mg by mouth at bedtime.    [provider]  atorvastatin (LIPITOR) 20 MG tablet TAKE 1  TABLET DAILY AT 6 P.M. 12/10/16   Carollee Herter, Alferd Apa, DO  CALCIUM-VITAMIN D PO Take 1 tablet by mouth daily.     [provider]  diphenhydrAMINE (BENADRYL) 25 mg capsule Take 25 mg by mouth at bedtime as needed for allergies (allergies). Reported on 10/27/2015    [provider]  doxycycline (VIBRA-TABS) 100 MG tablet TK 1 T PO  D TAT WITH RESTRICTIONS ON DAIRY PRODUCTS 06/12/17   [provider]  fenofibrate 160 MG tablet TAKE 1 TABLET(160 MG) BY MOUTH DAILY 01/14/17   Carollee Herter, Yvonne R, DO  fluticasone (FLONASE) 50 MCG/ACT nasal spray Place 2 sprays into both nostrils daily. 01/14/17   Saguier, Percell Miller, PA-C  folic acid (FOLVITE) 1 MG tablet Take 1 mg by mouth daily.      [provider]  glucose blood (ONETOUCH VERIO) test strip Use as instructed once daily. DX code: E11.59 10/15/16   Carollee Herter, Alferd Apa, DO  HYDROcodone-acetaminophen (NORCO) 10-325 MG per tablet Take 1 tablet by mouth every 4 (four) hours as needed for moderate pain or severe pain (pain).     [provider]  Lancets MISC Use as instructed once daily.  DX code: E11.59 01/16/17   Carollee Herter, Alferd Apa, DO  levocetirizine (XYZAL) 5 MG tablet Take 1 tablet (5 mg total) by mouth every evening. 05/21/17   Carollee Herter, Alferd Apa, DO  levothyroxine (SYNTHROID, LEVOTHROID) 125 MCG tablet TAKE 1 TABLET(125 MCG) BY MOUTH DAILY 04/24/17   Carollee Herter, Alferd Apa, DO  lisinopril (PRINIVIL,ZESTRIL) 5 MG tablet Take 1 tablet (5 mg total) by mouth daily. 01/16/17   Mosie Lukes, MD  methocarbamol (ROBAXIN) 500 MG tablet Take 1 tablet (500 mg total) by mouth every 6 (six) hours as needed for muscle spasms. 07/31/16   Kristeen Miss, MD  methotrexate (RHEUMATREX) 2.5 MG tablet Take 15 mg by mouth once a week. Monday Night.    [provider]  multivitamin [facility]) per tablet Take 1 tablet by mouth daily.      [provider]  omeprazole (PRILOSEC OTC) 20 MG tablet Take 20 mg by mouth  daily.    [provider]  ondansetron (ZOFRAN) 4 MG tablet TAKE 1 TABLET EVERY 6 HOURS AS NEEDED FOR NAUSEA OR VOMITING 10/25/16   Irene Shipper, MD  pioglitazone (ACTOS) 15 MG tablet TAKE 1 TABLET DAILY 02/27/17   Ann Held, DO  vitamin B-12 (CYANOCOBALAMIN) 1000 MCG tablet Take 1,000 mcg by mouth daily.    [provider]    Family History Family History  Problem Relation Age of Onset  . Diabetes Maternal Aunt   . Depression Brother        suicide  . Diabetes Father   . Stroke Father  18       MI in hospital for CVA  . Heart failure Mother        valvular disease  . Heart disease Paternal Aunt         2 aunts; 1 had MI @ ?> 23  . Heart disease Paternal Uncle           . Breast cancer Maternal Grandmother   . Colon cancer Neg Hx   . COPD Neg Hx   . Asthma Neg Hx     Social History Social History  Substance Use Topics  . Smoking status: Former Smoker    Quit date: 10/01/1980  . Smokeless tobacco: Never Used  . Alcohol use 0.0 oz/week     Comment: rare      Allergies   Shellfish allergy and Garlic   Review of Systems Review of Systems All other systems are reviewed and are negative for acute change except as noted in the HPI   Physical Exam Updated Vital Signs BP 120/61   Pulse 63   Temp 98.3 F (36.8 C) (Oral)   Resp 10   Ht 5\' 11"  (1.803 m)   Wt 100.2 kg (221 lb)   SpO2 99%   BMI 30.82 kg/m   Physical Exam  Constitutional: He is oriented to person, place, and time. He appears well-developed and well-nourished. No distress.  HENT:  Head: Normocephalic and atraumatic.  Nose: Nose normal.  Eyes: Pupils are equal, round, and reactive to light. Conjunctivae and EOM are normal. Right eye exhibits no discharge. Left eye exhibits no discharge. No scleral icterus.  Neck: Normal range of motion. Neck supple.  Cardiovascular: Normal rate and regular rhythm.  Exam reveals no gallop and no friction rub.   No murmur  heard. Pulmonary/Chest: Effort normal and breath sounds normal. No stridor. No respiratory distress. He has no rales.  Abdominal: Soft. He exhibits no distension. There is no tenderness.  Musculoskeletal: He exhibits no edema or tenderness.  Neurological: He is alert and oriented to person, place, and time.  Skin: Skin is warm and dry. No rash noted. He is not diaphoretic. No erythema.  Psychiatric: He has a normal mood and affect.  Vitals reviewed.    ED Treatments / Results  Labs (all labs ordered are listed, but only abnormal results are displayed) Labs Reviewed  CBC WITH DIFFERENTIAL/PLATELET - Abnormal; Notable for the following:       Result Value   RBC 3.83 (*)    Hemoglobin 11.7 (*)    HCT 35.8 (*)    All other components within normal limits  COMPREHENSIVE METABOLIC PANEL - Abnormal; Notable for the following:    Glucose, Bld 105 (*)    BUN 22 (*)    Creatinine, Ser 1.88 (*)    Calcium 8.8 (*)    ALT 16 (*)    GFR calc non Af Amer 32 (*)    GFR calc Af Amer 38 (*)    All other components within normal limits  URINALYSIS, ROUTINE W REFLEX MICROSCOPIC - Abnormal; Notable for the following:    Hgb urine dipstick TRACE (*)    All other components within normal limits  URINALYSIS, MICROSCOPIC (REFLEX) - Abnormal; Notable for the following:    Bacteria, UA RARE (*)    Squamous Epithelial / LPF 0-5 (*)    All other components within normal limits  OCCULT BLOOD X 1 CARD TO LAB, STOOL  PROTIME-INR  TROPONIN I  BRAIN NATRIURETIC PEPTIDE  EKG  EKG Interpretation None       Radiology Dg Chest 2 View  Result Date: 06/18/2017 CLINICAL DATA:  Shortness of breath for 3 weeks with some left-sided chest pain. EXAM: CHEST  2 VIEW COMPARISON:  CT chest 08/17/2014.  PA and lateral chest 03/05/2013. FINDINGS: The lungs are emphysematous but clear. Heart size is normal. Aortic atherosclerosis is noted. Postoperative change both shoulders is seen. No acute bony abnormality.  IMPRESSION: Emphysema without acute disease. Atherosclerosis. Electronically Signed   By: Inge Rise M.D.   On: 06/18/2017 12:44   Ct Angio Chest W/cm &/or Wo Cm  Result Date: 06/18/2017 CLINICAL DATA:  Shortness of breath and occasional chest pain for 3 weeks. History of pulmonary embolism in the past. EXAM: CT ANGIOGRAPHY CHEST WITH CONTRAST TECHNIQUE: Multidetector CT imaging of the chest was performed using the standard protocol during bolus administration of intravenous contrast. Multiplanar CT image reconstructions and MIPs were obtained to evaluate the vascular anatomy. CONTRAST:  80 cc Isovue 370 COMPARISON:  Chest CT angiogram dated 08/17/2014. FINDINGS: Cardiovascular: Moderate load of pulmonary embolism noted at the distal aspect of the right main pulmonary artery extending into the right interlobar pulmonary artery (nearly occlusive) and into multiple central segmental pulmonary artery branches to the right upper lobe, right middle lobe and right lower lobe. Smaller amount of pulmonary embolism is seen within the distal portion of the left main pulmonary artery extending into the left interlobar pulmonary artery and into multiple segmental pulmonary artery branches to the left upper lobe, lingula and left lower lobe. Positive for acute PE with CTevidence of right heart strain (RV/LV Ratio = 1.23) consistent with at least submassive (intermediate risk) PE. The presence of right heart strain has been associated with an increased risk of morbidity and mortality. Heart size is normal. No pericardial effusion. Aortic atherosclerosis. No aortic aneurysm or evidence of aortic dissection. Mediastinum/Nodes: No mass or enlarged lymph nodes within the mediastinum or perihilar regions. Esophagus is unremarkable. Trachea and central bronchi are unremarkable. Lungs/Pleura: Mild atelectasis at each lung base. No pleural effusion. Emphysematous changes at the lung apices, mild to moderate in degree. Upper  Abdomen: No acute abnormality. Musculoskeletal: Mild degenerative spurring within the mid and lower thoracic spine. Mild chronic-appearing compression deformity of the T12 vertebral body. No acute appearing osseous abnormality. Superficial soft tissues are unremarkable. Review of the MIP images confirms the above findings. IMPRESSION: 1. Bilateral acute PE, as detailed above, with CT evidence of right heart strain (RV/LV Ratio = 1.23) consistent with at least submassive (intermediate risk) PE. The presence of right heart strain has been associated with an increased risk of morbidity and mortality. 2. Mild atelectasis at each lung base. Lungs otherwise clear. Biapical emphysematous change. 3. Heart size is normal.  No pericardial effusion. 4. No aortic aneurysm or dissection. Aortic Atherosclerosis (ICD10-I70.0) and Emphysema (ICD10-J43.9). Critical Value/emergent results were called by telephone at the time of interpretation on 06/18/2017 at 5:15 pm to Dr. Roma Schanz , who verbally acknowledged these results. Electronically Signed   By: Franki Cabot M.D.   On: 06/18/2017 17:16    Procedures Procedures (including critical care time)  CRITICAL CARE Performed by: Grayce Sessions Yecenia Dalgleish Total critical care time: 35 minutes Critical care time was exclusive of separately billable procedures and treating other patients. Critical care was necessary to treat or prevent imminent or life-threatening deterioration. Critical care was time spent personally by me on the following activities: development of treatment plan with patient and/or surrogate  as well as nursing, discussions with consultants, evaluation of patient's response to treatment, examination of patient, obtaining history from patient or surrogate, ordering and performing treatments and interventions, ordering and review of laboratory studies, ordering and review of radiographic studies, pulse oximetry and re-evaluation of patient's  condition.   Medications Ordered in ED Medications  heparin ADULT infusion 100 units/mL (25000 units/278mL sodium chloride 0.45%) (1,400 Units/hr Intravenous New Bag/Given 06/18/17 1904)  heparin bolus via infusion 5,500 Units (5,500 Units Intravenous Bolus from Bag 06/18/17 1905)     Initial Impression / Assessment and Plan / ED Course  I have reviewed the triage vital signs and the nursing notes.  Pertinent labs & imaging results that were available during my care of the patient were reviewed by me and considered in my medical decision making (see chart for details).     Bilateral submassive pulmonary embolus in with right heart strain. Patient is hemodynamically stable. Hemoccult negative. Troponin and BNP negative. Started on heparin drip. We'll admit to stepdown unit.  Discussed case with pulmonology (Dr. Oletta Darter) who recommended SDU admission with Hep gtt. ECHO to determine strain. If severe, then consider directed thrombolytic. No systemic thrombolytics at this time.  Final Clinical Impressions(s) / ED Diagnoses   Final diagnoses:  Acute saddle pulmonary embolism with acute cor pulmonale (HCC)      Veyda Kaufman, Grayce Sessions, MD 06/18/17 2006

## 2017-06-19 ENCOUNTER — Encounter (HOSPITAL_COMMUNITY): Payer: Self-pay | Admitting: Internal Medicine

## 2017-06-19 ENCOUNTER — Inpatient Hospital Stay (HOSPITAL_COMMUNITY): Payer: Medicare Other

## 2017-06-19 DIAGNOSIS — Z86711 Personal history of pulmonary embolism: Secondary | ICD-10-CM

## 2017-06-19 DIAGNOSIS — N183 Chronic kidney disease, stage 3 unspecified: Secondary | ICD-10-CM | POA: Diagnosis present

## 2017-06-19 DIAGNOSIS — I2692 Saddle embolus of pulmonary artery without acute cor pulmonale: Secondary | ICD-10-CM

## 2017-06-19 DIAGNOSIS — E1151 Type 2 diabetes mellitus with diabetic peripheral angiopathy without gangrene: Secondary | ICD-10-CM

## 2017-06-19 LAB — CBC WITH DIFFERENTIAL/PLATELET
BASOS ABS: 0 10*3/uL (ref 0.0–0.1)
Basophils Relative: 1 %
EOS ABS: 0.3 10*3/uL (ref 0.0–0.7)
EOS PCT: 5 %
HCT: 32.9 % — ABNORMAL LOW (ref 39.0–52.0)
Hemoglobin: 10.7 g/dL — ABNORMAL LOW (ref 13.0–17.0)
LYMPHS ABS: 2 10*3/uL (ref 0.7–4.0)
Lymphocytes Relative: 33 %
MCH: 30 pg (ref 26.0–34.0)
MCHC: 32.5 g/dL (ref 30.0–36.0)
MCV: 92.2 fL (ref 78.0–100.0)
Monocytes Absolute: 0.5 10*3/uL (ref 0.1–1.0)
Monocytes Relative: 8 %
Neutro Abs: 3.3 10*3/uL (ref 1.7–7.7)
Neutrophils Relative %: 55 %
PLATELETS: 323 10*3/uL (ref 150–400)
RBC: 3.57 MIL/uL — ABNORMAL LOW (ref 4.22–5.81)
RDW: 15.6 % — ABNORMAL HIGH (ref 11.5–15.5)
WBC: 6 10*3/uL (ref 4.0–10.5)

## 2017-06-19 LAB — TROPONIN I

## 2017-06-19 LAB — COMPREHENSIVE METABOLIC PANEL
ALBUMIN: 3.4 g/dL — AB (ref 3.5–5.0)
ALK PHOS: 48 U/L (ref 38–126)
ALT: 17 U/L (ref 17–63)
AST: 27 U/L (ref 15–41)
Anion gap: 7 (ref 5–15)
BILIRUBIN TOTAL: 0.4 mg/dL (ref 0.3–1.2)
BUN: 20 mg/dL (ref 6–20)
CO2: 24 mmol/L (ref 22–32)
CREATININE: 1.84 mg/dL — AB (ref 0.61–1.24)
Calcium: 8.6 mg/dL — ABNORMAL LOW (ref 8.9–10.3)
Chloride: 107 mmol/L (ref 101–111)
GFR calc Af Amer: 38 mL/min — ABNORMAL LOW (ref >60)
GFR calc non Af Amer: 33 mL/min — ABNORMAL LOW (ref >60)
Glucose, Bld: 100 mg/dL — ABNORMAL HIGH (ref 65–99)
Potassium: 3.9 mmol/L (ref 3.5–5.1)
SODIUM: 138 mmol/L (ref 135–145)
TOTAL PROTEIN: 5.9 g/dL — AB (ref 6.5–8.1)

## 2017-06-19 LAB — GLUCOSE, CAPILLARY
GLUCOSE-CAPILLARY: 110 mg/dL — AB (ref 65–99)
GLUCOSE-CAPILLARY: 112 mg/dL — AB (ref 65–99)
Glucose-Capillary: 110 mg/dL — ABNORMAL HIGH (ref 65–99)
Glucose-Capillary: 108 mg/dL — ABNORMAL HIGH (ref 65–99)

## 2017-06-19 LAB — CBC
HCT: 36 % — ABNORMAL LOW (ref 39.0–52.0)
HEMOGLOBIN: 11.4 g/dL — AB (ref 13.0–17.0)
MCH: 29.3 pg (ref 26.0–34.0)
MCHC: 31.7 g/dL (ref 30.0–36.0)
MCV: 92.5 fL (ref 78.0–100.0)
Platelets: 367 10*3/uL (ref 150–400)
RBC: 3.89 MIL/uL — AB (ref 4.22–5.81)
RDW: 15.3 % (ref 11.5–15.5)
WBC: 6.7 10*3/uL (ref 4.0–10.5)

## 2017-06-19 LAB — HEPARIN LEVEL (UNFRACTIONATED)
HEPARIN UNFRACTIONATED: 0.61 [IU]/mL (ref 0.30–0.70)
Heparin Unfractionated: 0.49 IU/mL (ref 0.30–0.70)

## 2017-06-19 MED ORDER — FLUTICASONE PROPIONATE 50 MCG/ACT NA SUSP
2.0000 | Freq: Every day | NASAL | Status: DC
  Administered 2017-06-19 – 2017-06-22 (×4): 2 via NASAL
  Filled 2017-06-19: qty 16

## 2017-06-19 MED ORDER — ADULT MULTIVITAMIN W/MINERALS CH
1.0000 | ORAL_TABLET | Freq: Every day | ORAL | Status: DC
  Administered 2017-06-19 – 2017-06-22 (×4): 1 via ORAL
  Filled 2017-06-19 (×4): qty 1

## 2017-06-19 MED ORDER — FENOFIBRATE 160 MG PO TABS
160.0000 mg | ORAL_TABLET | Freq: Every day | ORAL | Status: DC
  Administered 2017-06-19 – 2017-06-22 (×4): 160 mg via ORAL
  Filled 2017-06-19 (×4): qty 1

## 2017-06-19 MED ORDER — LISINOPRIL 5 MG PO TABS
5.0000 mg | ORAL_TABLET | Freq: Every day | ORAL | Status: DC
  Administered 2017-06-19: 5 mg via ORAL
  Filled 2017-06-19: qty 1

## 2017-06-19 MED ORDER — VITAMIN B-12 1000 MCG PO TABS
1000.0000 ug | ORAL_TABLET | Freq: Every day | ORAL | Status: DC
  Administered 2017-06-19 – 2017-06-22 (×4): 1000 ug via ORAL
  Filled 2017-06-19 (×4): qty 1

## 2017-06-19 MED ORDER — APIXABAN 5 MG PO TABS: 5.0000 mg | ORAL_TABLET | Freq: Two times a day (BID) | ORAL | Status: DC

## 2017-06-19 MED ORDER — ASPIRIN EC 81 MG PO TBEC
81.0000 mg | DELAYED_RELEASE_TABLET | Freq: Every day | ORAL | Status: DC
  Administered 2017-06-19 – 2017-06-21 (×3): 81 mg via ORAL
  Filled 2017-06-19 (×3): qty 1

## 2017-06-19 MED ORDER — PIOGLITAZONE HCL 15 MG PO TABS
15.0000 mg | ORAL_TABLET | Freq: Every day | ORAL | Status: DC
  Administered 2017-06-19 – 2017-06-22 (×4): 15 mg via ORAL
  Filled 2017-06-19 (×4): qty 1

## 2017-06-19 MED ORDER — ONDANSETRON HCL 4 MG PO TABS
4.0000 mg | ORAL_TABLET | Freq: Four times a day (QID) | ORAL | Status: DC | PRN
  Administered 2017-06-20 – 2017-06-21 (×3): 4 mg via ORAL
  Filled 2017-06-19 (×3): qty 1

## 2017-06-19 MED ORDER — ATORVASTATIN CALCIUM 20 MG PO TABS
20.0000 mg | ORAL_TABLET | Freq: Every day | ORAL | Status: DC
  Administered 2017-06-19 – 2017-06-21 (×3): 20 mg via ORAL
  Filled 2017-06-19 (×3): qty 1

## 2017-06-19 MED ORDER — SODIUM CHLORIDE 0.9 % IV SOLN
INTRAVENOUS | Status: AC
  Administered 2017-06-19: 06:00:00 via INTRAVENOUS

## 2017-06-19 MED ORDER — ACETAMINOPHEN 325 MG PO TABS: 650.0000 mg | ORAL_TABLET | Freq: Four times a day (QID) | ORAL | Status: DC | PRN

## 2017-06-19 MED ORDER — HYDROCODONE-ACETAMINOPHEN 10-325 MG PO TABS
1.0000 | ORAL_TABLET | ORAL | Status: DC | PRN
  Administered 2017-06-19 – 2017-06-20 (×5): 1 via ORAL
  Filled 2017-06-19 (×5): qty 1

## 2017-06-19 MED ORDER — PANTOPRAZOLE SODIUM 40 MG PO TBEC
40.0000 mg | DELAYED_RELEASE_TABLET | Freq: Every day | ORAL | Status: DC
  Administered 2017-06-19 – 2017-06-22 (×4): 40 mg via ORAL
  Filled 2017-06-19 (×4): qty 1

## 2017-06-19 MED ORDER — INSULIN ASPART 100 UNIT/ML ~~LOC~~ SOLN
0.0000 [IU] | Freq: Three times a day (TID) | SUBCUTANEOUS | Status: DC
  Administered 2017-06-20: 1 [IU] via SUBCUTANEOUS

## 2017-06-19 MED ORDER — DIPHENHYDRAMINE HCL 25 MG PO CAPS: 25.0000 mg | ORAL_CAPSULE | Freq: Every evening | ORAL | Status: DC | PRN

## 2017-06-19 MED ORDER — APIXABAN 5 MG PO TABS
10.0000 mg | ORAL_TABLET | Freq: Two times a day (BID) | ORAL | Status: DC
  Administered 2017-06-19 – 2017-06-22 (×7): 10 mg via ORAL
  Filled 2017-06-19 (×7): qty 2

## 2017-06-19 MED ORDER — ALPRAZOLAM 0.5 MG PO TABS
0.5000 mg | ORAL_TABLET | Freq: Two times a day (BID) | ORAL | Status: DC | PRN
  Administered 2017-06-20: 0.5 mg via ORAL
  Filled 2017-06-19: qty 1

## 2017-06-19 MED ORDER — LEVOTHYROXINE SODIUM 25 MCG PO TABS
125.0000 ug | ORAL_TABLET | Freq: Every day | ORAL | Status: DC
  Administered 2017-06-19 – 2017-06-22 (×4): 125 ug via ORAL
  Filled 2017-06-19 (×4): qty 1

## 2017-06-19 MED ORDER — METHOCARBAMOL 500 MG PO TABS: 500.0000 mg | ORAL_TABLET | Freq: Four times a day (QID) | ORAL | Status: DC | PRN

## 2017-06-19 MED ORDER — ONDANSETRON HCL 4 MG/2ML IJ SOLN
4.0000 mg | Freq: Four times a day (QID) | INTRAMUSCULAR | Status: DC | PRN
  Administered 2017-06-19 (×2): 4 mg via INTRAVENOUS
  Filled 2017-06-19 (×2): qty 2

## 2017-06-19 MED ORDER — APIXABAN 5 MG PO TABS: 10.0000 mg | ORAL_TABLET | Freq: Two times a day (BID) | ORAL | Status: DC

## 2017-06-19 MED ORDER — ACETAMINOPHEN 650 MG RE SUPP: 650.0000 mg | Freq: Four times a day (QID) | RECTAL | Status: DC | PRN

## 2017-06-19 MED ORDER — FOLIC ACID 1 MG PO TABS
1.0000 mg | ORAL_TABLET | Freq: Every day | ORAL | Status: DC
  Administered 2017-06-19 – 2017-06-22 (×4): 1 mg via ORAL
  Filled 2017-06-19 (×4): qty 1

## 2017-06-19 NOTE — Discharge Instructions (Signed)
Information on my medicine - ELIQUIS (apixaban)  This medication education was reviewed with me or my healthcare representative as part of my discharge preparation.    Why was Eliquis prescribed for you? Eliquis was prescribed to treat blood clots that may have been found in the veins of your legs (deep vein thrombosis) or in your lungs (pulmonary embolism) and to reduce the risk of them occurring again.  What do You need to know about Eliquis ? The starting dose is 10 mg (two 5 mg tablets) taken TWICE daily for the FIRST SEVEN (7) DAYS, then on (enter date)  06/26/17  the dose is reduced to ONE 5 mg tablet taken TWICE daily.  Eliquis may be taken with or without food.   Try to take the dose about the same time in the morning and in the evening. If you have difficulty swallowing the tablet whole please discuss with your pharmacist how to take the medication safely.  Take Eliquis exactly as prescribed and DO NOT stop taking Eliquis without talking to the doctor who prescribed the medication.  Stopping may increase your risk of developing a new blood clot.  Refill your prescription before you run out.  After discharge, you should have regular check-up appointments with your healthcare provider that is prescribing your Eliquis.    What do you do if you miss a dose? If a dose of ELIQUIS is not taken at the scheduled time, take it as soon as possible on the same day and twice-daily administration should be resumed. The dose should not be doubled to make up for a missed dose.  Important Safety Information A possible side effect of Eliquis is bleeding. You should call your healthcare provider right away if you experience any of the following: ? Bleeding from an injury or your nose that does not stop. ? Unusual colored urine (red or dark brown) or unusual colored stools (red or black). ? Unusual bruising for unknown reasons. ? A serious fall or if you hit your head (even if there is no  bleeding).  Some medicines may interact with Eliquis and might increase your risk of bleeding or clotting while on Eliquis. To help avoid this, consult your healthcare provider or pharmacist prior to using any new prescription or non-prescription medications, including herbals, vitamins, non-steroidal anti-inflammatory drugs (NSAIDs) and supplements.  This website has more information on Eliquis (apixaban): http://www.eliquis.com/eliquis/home

## 2017-06-19 NOTE — Progress Notes (Signed)
Per pharmacy heparin gtt turned off and Xarelto given per order.Will continue to monitor.

## 2017-06-19 NOTE — H&P (Signed)
History and Physical    Louis [name]. LNL:[id number] DOB: [birth year] DOA: 06/18/2017  PCP: Ann Held, DO  Patient coming from: Home.  Chief Complaint: Shortness of breath.  HPI: Louis [name]. is a 81 y.o. male with history of pulmonary embolism previously, diabetes mellitus type 2, hypertension, hyperlipidemia, rheumatoid arthritis, chronic kidney disease was referred to the ER after patient had CAT scan which showed pulmonary embolism. Patient has been having exertional shortness of breath over the last 3 weeks. And also has been having pleuritic-type of chest pain. Patient had followed up with his primary care physician who ordered CT angiogram of the chest which was positive for bilateral pulmonary embolism. Patient was referred to the ER.   ED Course: In the ER patient remained hemodynamically stable. Patient was started on heparin. 2-D echo did not show any right ventricular strain. But did show pulmonary hypertension. Patient denies any recent travel or any recent surgery. Denies any swelling of the lower extremity.  Review of Systems: As per HPI, rest all negative.   Past Medical History:  Diagnosis Date  . Anemia   . Arthritis    Dr Amil Amen, [place] rx- Rheumatology  . Diabetes mellitus   . Diverticulosis   . GERD (gastroesophageal reflux disease)   . Hemochromatosis     Dr Henrene Pastor  . History of kidney stones 1980   passed spont.  Marland Kitchen Hx of adenomatous colonic polyps   . Hyperlipidemia   . Hypertension   . Hypothyroidism   . IBS (irritable bowel syndrome)   . Internal hemorrhoids   . Kidney stones, calcium oxalate    1985 & 2005  . Pulmonary embolism ([place]) 2015   hosp. with Heparin & then on Xarelto- 3 months or so  . Reflux esophagitis   . SBO (small bowel obstruction) ([place]) 2004  . Spinal stenosis    Dr.Ramos  . Testosterone deficiency    111 in 2007    Past Surgical History:  Procedure Laterality Date  . APPENDECTOMY    . cataract  2005     bilaterally  . COLONOSCOPY W/ POLYPECTOMY  2005   tubular adenoma  . JOINT REPLACEMENT     L- shoulder, x2, replacement on both knees   . L4-5 & L5- S1 bilat facet injections  01/20/14    Dr Nelva Bush  . LUMBAR LAMINECTOMY WITH COFLEX 2 LEVEL N/A 07/30/2016   Procedure: Lumbar three- four Lumbar four - five Laminectomy and foraminotomy;  Surgeon: Kristeen Miss, MD;  Location: [place];  Service: Neurosurgery;  Laterality: N/A;  L3-4 L4-5 Laminectomy with coflex  . lumbar nerve block      X 14; Dr Nelva Bush  . MIDDLE EAR SURGERY  1980   Teflon tack   . REFRACTIVE SURGERY  2005   SE Ophth  . SHOULDER SURGERY      2 on L ; 3 on R  . TM replacement  1981  . TONSILLECTOMY AND ADENOIDECTOMY    . TOTAL KNEE ARTHROPLASTY   201 & 2012    bilaterally;Dr Alusio  . TOTAL SHOULDER REPLACEMENT     L     reports that he quit smoking about 36 years ago. He has never used smokeless tobacco. He reports that he drinks alcohol. He reports that he does not use drugs.  Allergies  Allergen Reactions  . Shellfish Allergy     ANGIOEDEMA  . Garlic Nausea And Vomiting    Family History  Problem Relation Age  of Onset  . Diabetes Maternal Aunt   . Depression Brother        suicide  . Diabetes Father   . Stroke Father 89       MI in hospital for CVA  . Heart failure Mother        valvular disease  . Heart disease Paternal Aunt         2 aunts; 1 had MI @ ?> 30  . Heart disease Paternal Uncle           . Breast cancer Maternal Grandmother   . Colon cancer Neg Hx   . COPD Neg Hx   . Asthma Neg Hx     Prior to Admission medications   Medication Sig Start Date End Date Taking? Authorizing Provider  ALPRAZolam Duanne Moron) 0.5 MG tablet TAKE ONE-HALF BY MOUTH EVERY 8 TO 12 HOURS AS NEEDED 02/19/17   Carollee Herter, Alferd Apa, DO  aspirin EC 81 MG tablet Take 81 mg by mouth at bedtime.    [provider]  atorvastatin (LIPITOR) 20 MG tablet TAKE 1 TABLET DAILY AT 6 P.M. 12/10/16   Carollee Herter, Alferd Apa, DO   CALCIUM-VITAMIN D PO Take 1 tablet by mouth daily.     [provider]  diphenhydrAMINE (BENADRYL) 25 mg capsule Take 25 mg by mouth at bedtime as needed for allergies (allergies). Reported on 10/27/2015    [provider]  doxycycline (VIBRA-TABS) 100 MG tablet TK 1 T PO  D TAT WITH RESTRICTIONS ON DAIRY PRODUCTS 06/12/17   [provider]  fenofibrate 160 MG tablet TAKE 1 TABLET(160 MG) BY MOUTH DAILY 01/14/17   Carollee Herter, Yvonne R, DO  fluticasone (FLONASE) 50 MCG/ACT nasal spray Place 2 sprays into both nostrils daily. 01/14/17   Saguier, Percell Miller, PA-C  folic acid (FOLVITE) 1 MG tablet Take 1 mg by mouth daily.      [provider]  glucose blood (ONETOUCH VERIO) test strip Use as instructed once daily. DX code: E11.59 10/15/16   Carollee Herter, Alferd Apa, DO  HYDROcodone-acetaminophen (NORCO) 10-325 MG per tablet Take 1 tablet by mouth every 4 (four) hours as needed for moderate pain or severe pain (pain).     [provider]  Lancets MISC Use as instructed once daily.  DX code: E11.59 01/16/17   Carollee Herter, Alferd Apa, DO  levocetirizine (XYZAL) 5 MG tablet Take 1 tablet (5 mg total) by mouth every evening. 05/21/17   Carollee Herter, Alferd Apa, DO  levothyroxine (SYNTHROID, LEVOTHROID) 125 MCG tablet TAKE 1 TABLET(125 MCG) BY MOUTH DAILY 04/24/17   Carollee Herter, Alferd Apa, DO  lisinopril (PRINIVIL,ZESTRIL) 5 MG tablet Take 1 tablet (5 mg total) by mouth daily. 01/16/17   Mosie Lukes, MD  methocarbamol (ROBAXIN) 500 MG tablet Take 1 tablet (500 mg total) by mouth every 6 (six) hours as needed for muscle spasms. 07/31/16   Kristeen Miss, MD  methotrexate (RHEUMATREX) 2.5 MG tablet Take 15 mg by mouth once a week. Monday Night.    [provider]  multivitamin [facility]) per tablet Take 1 tablet by mouth daily.      [provider]  omeprazole (PRILOSEC OTC) 20 MG tablet Take 20 mg by mouth daily.    [provider]  ondansetron (ZOFRAN)  4 MG tablet TAKE 1 TABLET EVERY 6 HOURS AS NEEDED FOR NAUSEA OR VOMITING 10/25/16   Irene Shipper, MD  pioglitazone (ACTOS) 15 MG tablet TAKE 1 TABLET DAILY 02/27/17  Carollee Herter, Yvonne R, DO  vitamin B-12 (CYANOCOBALAMIN) 1000 MCG tablet Take 1,000 mcg by mouth daily.    [provider]    Physical Exam: Vitals:   06/18/17 2333 06/19/17 0000 06/19/17 0116 06/19/17 0241  BP: (!) 138/51 140/73 (!) 158/86 129/68  Pulse: 79 72 67 66  Resp: 18 16 (!) 21 17  Temp:   97.9 F (36.6 C) 97.9 F (36.6 C)  TempSrc:   Oral Oral  SpO2: 99% 99% 95% 93%  Weight:   99.6 kg (219 lb 9.3 oz)   Height:   5\' 11"  (1.803 m)       Constitutional: Moderately built and nourished. Vitals:   06/18/17 2333 06/19/17 0000 06/19/17 0116 06/19/17 0241  BP: (!) 138/51 140/73 (!) 158/86 129/68  Pulse: 79 72 67 66  Resp: 18 16 (!) 21 17  Temp:   97.9 F (36.6 C) 97.9 F (36.6 C)  TempSrc:   Oral Oral  SpO2: 99% 99% 95% 93%  Weight:   99.6 kg (219 lb 9.3 oz)   Height:   5\' 11"  (1.803 m)    Eyes: Anicteric no pallor. ENMT: No discharge from the ears eyes nose or mouth. Neck: No JVD appreciated no mass felt. Respiratory: No rhonchi or crepitations. Cardiovascular: S1-S2 no murmurs appreciated. Abdomen: Soft nontender bowel sounds present. Musculoskeletal: No edema. No joint effusion. Skin: No rash. Neurologic: Alert awake oriented to time place and person. Moves all extremities. Psychiatric: Appears normal. Normal affect   Labs on Admission: I have personally reviewed following labs and imaging studies  CBC:  Recent Labs Lab 06/18/17 1216 06/18/17 1751 06/19/17 0146  WBC 6.2 6.8 6.7  NEUTROABS 3.9 3.7  --   HGB 12.0* 11.7* 11.4*  HCT 37.4* 35.8* 36.0*  MCV 93.9 93.5 92.5  PLT 412.0* 399 458   Basic Metabolic Panel:  Recent Labs Lab 06/18/17 1216 06/18/17 1751  NA 141 138  K 4.2 3.8  CL 107 104  CO2 26 25  GLUCOSE 148* 105*  BUN 23 22*  CREATININE 1.87* 1.88*  CALCIUM  9.2 8.8*   GFR: Estimated Creatinine Clearance: 38.3 mL/min (A) (by C-G formula based on SCr of 1.88 mg/dL (H)). Liver Function Tests:  Recent Labs Lab 06/18/17 1751  AST 27  ALT 16*  ALKPHOS 64  BILITOT 0.6  PROT 7.7  ALBUMIN 4.0   No results for input(s): LIPASE, AMYLASE in the last 168 hours. No results for input(s): AMMONIA in the last 168 hours. Coagulation Profile:  Recent Labs Lab 06/18/17 1751  INR 1.02   Cardiac Enzymes:  Recent Labs Lab 06/18/17 1752  TROPONINI <0.03   BNP (last 3 results) No results for input(s): PROBNP in the last 8760 hours. HbA1C: No results for input(s): HGBA1C in the last 72 hours. CBG: No results for input(s): GLUCAP in the last 168 hours. Lipid Profile: No results for input(s): CHOL, HDL, LDLCALC, TRIG, CHOLHDL, LDLDIRECT in the last 72 hours. Thyroid Function Tests: No results for input(s): TSH, T4TOTAL, FREET4, T3FREE, THYROIDAB in the last 72 hours. Anemia Panel: No results for input(s): VITAMINB12, FOLATE, FERRITIN, TIBC, IRON, RETICCTPCT in the last 72 hours. Urine analysis:    Component Value Date/Time   COLORURINE YELLOW 06/18/2017 1752   APPEARANCEUR CLEAR 06/18/2017 1752   LABSPEC 1.010 06/18/2017 1752   PHURINE 7.0 06/18/2017 1752   GLUCOSEU NEGATIVE 06/18/2017 1752   HGBUR TRACE (A) 06/18/2017 1752   HGBUR large 08/28/2010 [place] 06/18/2017 1752  BILIRUBINUR neg 10/09/2013 1637   KETONESUR NEGATIVE 06/18/2017 1752   PROTEINUR NEGATIVE 06/18/2017 1752   UROBILINOGEN 0.2 10/09/2013 1637   UROBILINOGEN 0.2 06/11/2011 1317   NITRITE NEGATIVE 06/18/2017 1752   LEUKOCYTESUR NEGATIVE 06/18/2017 1752   Sepsis Labs: @LABRCNTIP (procalcitonin:4,lacticidven:4) )No results found for this or any previous visit (from the past 240 hour(s)).   Radiological Exams on Admission: Dg Chest 2 View  Result Date: 06/18/2017 CLINICAL DATA:  Shortness of breath for 3 weeks with some left-sided chest pain.  EXAM: CHEST  2 VIEW COMPARISON:  CT chest 08/17/2014.  PA and lateral chest 03/05/2013. FINDINGS: The lungs are emphysematous but clear. Heart size is normal. Aortic atherosclerosis is noted. Postoperative change both shoulders is seen. No acute bony abnormality. IMPRESSION: Emphysema without acute disease. Atherosclerosis. Electronically Signed   By: Inge Rise M.D.   On: 06/18/2017 12:44   Ct Angio Chest W/cm &/or Wo Cm  Result Date: 06/18/2017 CLINICAL DATA:  Shortness of breath and occasional chest pain for 3 weeks. History of pulmonary embolism in the past. EXAM: CT ANGIOGRAPHY CHEST WITH CONTRAST TECHNIQUE: Multidetector CT imaging of the chest was performed using the standard protocol during bolus administration of intravenous contrast. Multiplanar CT image reconstructions and MIPs were obtained to evaluate the vascular anatomy. CONTRAST:  80 cc Isovue 370 COMPARISON:  Chest CT angiogram dated 08/17/2014. FINDINGS: Cardiovascular: Moderate load of pulmonary embolism noted at the distal aspect of the right main pulmonary artery extending into the right interlobar pulmonary artery (nearly occlusive) and into multiple central segmental pulmonary artery branches to the right upper lobe, right middle lobe and right lower lobe. Smaller amount of pulmonary embolism is seen within the distal portion of the left main pulmonary artery extending into the left interlobar pulmonary artery and into multiple segmental pulmonary artery branches to the left upper lobe, lingula and left lower lobe. Positive for acute PE with CTevidence of right heart strain (RV/LV Ratio = 1.23) consistent with at least submassive (intermediate risk) PE. The presence of right heart strain has been associated with an increased risk of morbidity and mortality. Heart size is normal. No pericardial effusion. Aortic atherosclerosis. No aortic aneurysm or evidence of aortic dissection. Mediastinum/Nodes: No mass or enlarged lymph nodes  within the mediastinum or perihilar regions. Esophagus is unremarkable. Trachea and central bronchi are unremarkable. Lungs/Pleura: Mild atelectasis at each lung base. No pleural effusion. Emphysematous changes at the lung apices, mild to moderate in degree. Upper Abdomen: No acute abnormality. Musculoskeletal: Mild degenerative spurring within the mid and lower thoracic spine. Mild chronic-appearing compression deformity of the T12 vertebral body. No acute appearing osseous abnormality. Superficial soft tissues are unremarkable. Review of the MIP images confirms the above findings. IMPRESSION: 1. Bilateral acute PE, as detailed above, with CT evidence of right heart strain (RV/LV Ratio = 1.23) consistent with at least submassive (intermediate risk) PE. The presence of right heart strain has been associated with an increased risk of morbidity and mortality. 2. Mild atelectasis at each lung base. Lungs otherwise clear. Biapical emphysematous change. 3. Heart size is normal.  No pericardial effusion. 4. No aortic aneurysm or dissection. Aortic Atherosclerosis (ICD10-I70.0) and Emphysema (ICD10-J43.9). Critical Value/emergent results were called by telephone at the time of interpretation on 06/18/2017 at 5:15 pm to Dr. Roma Schanz , who verbally acknowledged these results. Electronically Signed   By: Franki Cabot M.D.   On: 06/18/2017 17:16    EKG: Independently reviewed. Normal sinus rhythm with nonspecific ST-T changes and ephelides.  Assessment/Plan Principal Problem:   Pulmonary embolism (HCC) Active Problems:   Hypothyroidism   Essential hypertension   DM (diabetes mellitus) type II, controlled, with peripheral vascular disorder (HCC)   Hemochromatosis   CKD (chronic kidney disease) stage 3, GFR 30-59 ml/min    1. Bilateral pulmonary embolism unprovoked - 2-D echo does not show any acute strain but does show pulmonary hypertension. Will cycle cardiac markers keep patient on heparin. If  patient continues to remain hemodynamically stable change to xarelto. This is patient's second episode of pulmonary embolism and likely will need lifelong anticoagulation. Check Dopplers of the lower extremity. 2. Diabetes mellitus type 2 on Actos. I have also place patient on sliding scale coverage. 3. Rheumatoid arthritis on methotrexate. 4. Chronic kidney disease stage III. Creatinine appears to be baseline. 5. Normocytic normochromic anemia appears to be chronic. Likely related to kidney disease and rheumatoid arthritis. 6. History of hemochromatosis. 7. Hypothyroidism on Synthroid. 8. Hypertension on lisinopril. Closely monitor blood pressure trends.  I have reviewed patient's old charts and labs.   DVT prophylaxis: Heparin. Code Status: Full code.  Family Communication: Discussed with patient.  Disposition Plan: Home.  Consults called: None.  Admission status: Inpatient.    Rise Patience MD [facility] Pager [phone].  If 7PM-7AM, please contact night-coverage [email or web address] Password [facility]  06/19/2017, 5:36 AM

## 2017-06-19 NOTE — Progress Notes (Signed)
81 yo male admitted with unprovoked B/L PE.doppler of lower extremity pending.continue heparin.start xarelto.patient remains stable at this time.

## 2017-06-19 NOTE — Progress Notes (Signed)
**  Preliminary report by tech**  Bilateral lower extremity venous duplex complete. There is evidence of acute deep vein thrombosis involving the popliteal, and posterior tibial veins of the right lower extremity. There is no evidence of superficial vein thrombosis involving the right lower extremity. There is no evidence of deep or superficial vein thrombosis involving the left lower extremity. There is no evidence of a Baker's cyst bilaterally. Results were given to the patient's nurse, [facility]  06/19/17 5:15 PM Carlos Levering RVT

## 2017-06-19 NOTE — Progress Notes (Addendum)
ANTICOAGULATION CONSULT NOTE - F/u Consult  Pharmacy Consult for heparin Indication: bilateral PE  Allergies  Allergen Reactions  . Shellfish Allergy     ANGIOEDEMA  . Garlic Nausea And Vomiting    Patient Measurements: Height: 5\' 11"  (180.3 cm) Weight: 219 lb 9.3 oz (99.6 kg) IBW/kg (Calculated) : 75.3 Heparin Dosing Weight: 96kg  Vital Signs: Temp: 97.9 F (36.6 C) (09/19 0241) Temp Source: Oral (09/19 0241) BP: 129/68 (09/19 0241) Pulse Rate: 66 (09/19 0241)  Labs:  Recent Labs  06/18/17 1216 06/18/17 1751 06/18/17 1752 06/19/17 0146  HGB 12.0* 11.7*  --  11.4*  HCT 37.4* 35.8*  --  36.0*  PLT 412.0* 399  --  367  LABPROT  --  13.3  --   --   INR  --  1.02  --   --   HEPARINUNFRC  --   --   --  0.49  CREATININE 1.87* 1.88*  --   --   TROPONINI  --   --  <0.03  --     Estimated Creatinine Clearance: 38.3 mL/min (A) (by C-G formula based on SCr of 1.88 mg/dL (H)).   Medical History: Past Medical History:  Diagnosis Date  . Anemia   . Arthritis    Dr Amil Amen, [place] rx- Rheumatology  . Diabetes mellitus   . Diverticulosis   . GERD (gastroesophageal reflux disease)   . Hemochromatosis     Dr Henrene Pastor  . History of kidney stones 1980   passed spont.  Marland Kitchen Hx of adenomatous colonic polyps   . Hyperlipidemia   . Hypertension   . Hypothyroidism   . IBS (irritable bowel syndrome)   . Internal hemorrhoids   . Kidney stones, calcium oxalate    1985 & 2005  . Pulmonary embolism ([place]) 2015   hosp. with Heparin & then on Xarelto- 3 months or so  . Reflux esophagitis   . SBO (small bowel obstruction) ([place]) 2004  . Spinal stenosis    Dr.Ramos  . Testosterone deficiency    111 in 2007    Medications:  Infusions:  . heparin 1,400 Units/hr (06/18/17 1904)    Assessment: 20 yom presented to the ED with SOB and acute bilateral PE with right heart strain. To start IV heparin.   Heparin level therapeutic at 0.49. CBC stable and no s/s bleeding noted.   Goal  of Therapy:  Heparin level 0.3-0.7 units/ml Monitor platelets by anticoagulation protocol: Yes   Plan:  Continue heparin gtt at 1400 units/hr Confirm heparin level in 8 hrs Daily heparin level and CBC Monitor for s/s bleeding F/u [facility] plan   Argie Ramming, PharmD Clinical Pharmacist 06/19/17 3:11 AM

## 2017-06-19 NOTE — Progress Notes (Signed)
Notified MD regarding results of lower extremity venous doppler duplex. MD stated to make patient bedrest overnight and will readdress in the am. Patient is on eliquis. Discussed plan with the patient and patient verbalized understanding. Will continue to monitor.

## 2017-06-20 LAB — BASIC METABOLIC PANEL
Anion gap: 7 (ref 5–15)
BUN: 19 mg/dL (ref 6–20)
CHLORIDE: 109 mmol/L (ref 101–111)
CO2: 22 mmol/L (ref 22–32)
CREATININE: 1.71 mg/dL — AB (ref 0.61–1.24)
Calcium: 8.5 mg/dL — ABNORMAL LOW (ref 8.9–10.3)
GFR, EST AFRICAN AMERICAN: 42 mL/min — AB (ref >60)
GFR, EST NON AFRICAN AMERICAN: 36 mL/min — AB (ref >60)
Glucose, Bld: 102 mg/dL — ABNORMAL HIGH (ref 65–99)
POTASSIUM: 3.9 mmol/L (ref 3.5–5.1)
SODIUM: 138 mmol/L (ref 135–145)

## 2017-06-20 LAB — GLUCOSE, CAPILLARY
GLUCOSE-CAPILLARY: 121 mg/dL — AB (ref 65–99)
GLUCOSE-CAPILLARY: 119 mg/dL — AB (ref 65–99)
Glucose-Capillary: 101 mg/dL — ABNORMAL HIGH (ref 65–99)
Glucose-Capillary: 103 mg/dL — ABNORMAL HIGH (ref 65–99)

## 2017-06-20 LAB — MAGNESIUM: MAGNESIUM: 2 mg/dL (ref 1.7–2.4)

## 2017-06-20 LAB — CBC
HCT: 32.1 % — ABNORMAL LOW (ref 39.0–52.0)
HEMOGLOBIN: 10.2 g/dL — AB (ref 13.0–17.0)
MCH: 29.5 pg (ref 26.0–34.0)
MCHC: 31.8 g/dL (ref 30.0–36.0)
MCV: 92.8 fL (ref 78.0–100.0)
PLATELETS: 321 10*3/uL (ref 150–400)
RBC: 3.46 MIL/uL — AB (ref 4.22–5.81)
RDW: 15.6 % — ABNORMAL HIGH (ref 11.5–15.5)
WBC: 4.4 10*3/uL (ref 4.0–10.5)

## 2017-06-20 LAB — PHOSPHORUS: PHOSPHORUS: 3.2 mg/dL (ref 2.5–4.6)

## 2017-06-20 MED ORDER — HYDROCODONE-ACETAMINOPHEN 10-325 MG PO TABS
1.0000 | ORAL_TABLET | ORAL | Status: DC | PRN
  Administered 2017-06-20 – 2017-06-22 (×9): 1 via ORAL
  Filled 2017-06-20 (×9): qty 1

## 2017-06-20 NOTE — Progress Notes (Signed)
PT Cancellation Note  Patient Details Name: Louis [name]. MRN: [id number] DOB: 12-05-1935   Cancelled Treatment:    Reason Eval/Treat Not Completed: Medical issues which prohibited therapy Pt currently on bedrest, will await increased activity orders before proceeding with eval.   Bailey Mech Fleet 06/20/2017, 7:54 AM

## 2017-06-20 NOTE — Progress Notes (Signed)
OT Cancellation Note  Patient Details Name: Louis [name]. MRN: [id number] DOB: 1936/01/09   Cancelled Treatment:    Reason Eval/Treat Not Completed: Patient not medically ready. Pt currently on bedrest, will await increased activity orders before proceeding with eval.  Almon Register [phone] 06/20/2017, 7:50 AM

## 2017-06-20 NOTE — Progress Notes (Signed)
PROGRESS NOTE    Louis [name].  WUJ:[id number] DOB: October 22, 1935 DOA: 06/18/2017 PCP: Ann Held, DO   Brief Narrative: 81 year old male admitted with bilateral pulmonary embolism. This patient has a history of PE 3 years ago. Patient is being treated with Eliquis. Doppler of the lower extremity shows an acute DVT in the right lower extremity. Patient denies recent travel or recent surgery.   Assessment & Plan:   Principal Problem:   Pulmonary embolism (HCC) Active Problems:   Hypothyroidism   Essential hypertension   DM (diabetes mellitus) type II, controlled, with peripheral vascular disorder (HCC)   Hemochromatosis   CKD (chronic kidney disease) stage 3, GFR 30-59 ml/min Bilateral pulmonary embolism unprovoked continue Eliquis 10 mg twice a day for 7 days then 5 mg twice a day.  Hypertension patient with history of hypertension but blood pressure on the low to low-normal side will DC ACE inhibitor and monitor closely.  Type 2 diabetes on Actos continue blood sugar stable.  CK D stable stage III creatinine 1.7 at baseline.  PVCs asymptomatic. All electrolytes within normal limits. He is not on a beta blocker to  borderline blood pressure.  Bradycardia patient not on any beta blockers. Cardiac enzymes normal. Will check EKG. If he continues to be bradycardic and symptomatic consult cardiology.   Anemia of chronic disease.stable.  hypothyroidsm continue synthroid.  RA MTX not reorderded.   DVT prophylaxis: Code Status:  Family Communication:  Disposition Plan:    Consultants: none   Procedures: none  Antimicrobials: none   Subjective: Feels better.  Objective: Vitals:   06/20/17 0403 06/20/17 0447 06/20/17 0800 06/20/17 0900  BP: (!) 95/37 (!) 122/56  133/79  Pulse: (!) 53  (!) 56 66  Resp: 17  13 (!) 21  Temp: 98.2 F (36.8 C)  98.4 F (36.9 C)   TempSrc: Oral  Oral   SpO2: 95%  97% 94%  Weight:      Height:        Intake/Output  Summary (Last 24 hours) at 06/20/17 1225 Last data filed at 06/20/17 1018  Gross per 24 hour  Intake          1597.23 ml  Output             1825 ml  Net          -227.77 ml   Filed Weights   06/18/17 1726 06/19/17 0116  Weight: 100.2 kg (221 lb) 99.6 kg (219 lb 9.3 oz)    Examination:  General exam: Appears calm and comfortable  Respiratory system: Clear to auscultation. Respiratory effort normal. Cardiovascular system: S1 & S2 heard, RRR. No JVD, murmurs, rubs, gallops or clicks. No pedal edema. Gastrointestinal system: Abdomen is nondistended, soft and nontender. No organomegaly or masses felt. Normal bowel sounds heard. Central nervous system: Alert and oriented. No focal neurological deficits. Extremities: Symmetric 5 x 5 power. Skin: No rashes, lesions or ulcers Psychiatry: Judgement and insight appear normal. Mood & affect appropriate.     Data Reviewed: I have personally reviewed following labs and imaging studies  CBC:  Recent Labs Lab 06/18/17 1216 06/18/17 1751 06/19/17 0146 06/19/17 0609 06/20/17 0231  WBC 6.2 6.8 6.7 6.0 4.4  NEUTROABS 3.9 3.7  --  3.3  --   HGB 12.0* 11.7* 11.4* 10.7* 10.2*  HCT 37.4* 35.8* 36.0* 32.9* 32.1*  MCV 93.9 93.5 92.5 92.2 92.8  PLT 412.0* 399 367 323 956   Basic Metabolic Panel:  Recent Labs  Lab 06/18/17 1216 06/18/17 1751 06/19/17 0609 06/20/17 0231  NA 141 138 138 138  K 4.2 3.8 3.9 3.9  CL 107 104 107 109  CO2 26 25 24 22   GLUCOSE 148* 105* 100* 102*  BUN 23 22* 20 19  CREATININE 1.87* 1.88* 1.84* 1.71*  CALCIUM 9.2 8.8* 8.6* 8.5*  MG  --   --   --  2.0  PHOS  --   --   --  3.2   GFR: Estimated Creatinine Clearance: 42.1 mL/min (A) (by C-G formula based on SCr of 1.71 mg/dL (H)). Liver Function Tests:  Recent Labs Lab 06/18/17 1751 06/19/17 0609  AST 27 27  ALT 16* 17  ALKPHOS 64 48  BILITOT 0.6 0.4  PROT 7.7 5.9*  ALBUMIN 4.0 3.4*   No results for input(s): LIPASE, AMYLASE in the last 168  hours. No results for input(s): AMMONIA in the last 168 hours. Coagulation Profile:  Recent Labs Lab 06/18/17 1751  INR 1.02   Cardiac Enzymes:  Recent Labs Lab 06/18/17 1752 06/19/17 0609 06/19/17 1103 06/19/17 1759  TROPONINI <0.03 <0.03 <0.03 <0.03   BNP (last 3 results) No results for input(s): PROBNP in the last 8760 hours. HbA1C: No results for input(s): HGBA1C in the last 72 hours. CBG:  Recent Labs Lab 06/19/17 0753 06/19/17 1207 06/19/17 1654 06/19/17 2136 06/20/17 0824  GLUCAP 110* 110* 108* 112* 101*   Lipid Profile: No results for input(s): CHOL, HDL, LDLCALC, TRIG, CHOLHDL, LDLDIRECT in the last 72 hours. Thyroid Function Tests: No results for input(s): TSH, T4TOTAL, FREET4, T3FREE, THYROIDAB in the last 72 hours. Anemia Panel: No results for input(s): VITAMINB12, FOLATE, FERRITIN, TIBC, IRON, RETICCTPCT in the last 72 hours. Sepsis Labs: No results for input(s): PROCALCITON, LATICACIDVEN in the last 168 hours.  No results found for this or any previous visit (from the past 240 hour(s)).       Radiology Studies: Dg Chest 2 View  Result Date: 06/18/2017 CLINICAL DATA:  Shortness of breath for 3 weeks with some left-sided chest pain. EXAM: CHEST  2 VIEW COMPARISON:  CT chest 08/17/2014.  PA and lateral chest 03/05/2013. FINDINGS: The lungs are emphysematous but clear. Heart size is normal. Aortic atherosclerosis is noted. Postoperative change both shoulders is seen. No acute bony abnormality. IMPRESSION: Emphysema without acute disease. Atherosclerosis. Electronically Signed   By: Inge Rise M.D.   On: 06/18/2017 12:44   Ct Angio Chest W/cm &/or Wo Cm  Result Date: 06/18/2017 CLINICAL DATA:  Shortness of breath and occasional chest pain for 3 weeks. History of pulmonary embolism in the past. EXAM: CT ANGIOGRAPHY CHEST WITH CONTRAST TECHNIQUE: Multidetector CT imaging of the chest was performed using the standard protocol during bolus  administration of intravenous contrast. Multiplanar CT image reconstructions and MIPs were obtained to evaluate the vascular anatomy. CONTRAST:  80 cc Isovue 370 COMPARISON:  Chest CT angiogram dated 08/17/2014. FINDINGS: Cardiovascular: Moderate load of pulmonary embolism noted at the distal aspect of the right main pulmonary artery extending into the right interlobar pulmonary artery (nearly occlusive) and into multiple central segmental pulmonary artery branches to the right upper lobe, right middle lobe and right lower lobe. Smaller amount of pulmonary embolism is seen within the distal portion of the left main pulmonary artery extending into the left interlobar pulmonary artery and into multiple segmental pulmonary artery branches to the left upper lobe, lingula and left lower lobe. Positive for acute PE with CTevidence of right heart strain (RV/LV Ratio = 1.23)  consistent with at least submassive (intermediate risk) PE. The presence of right heart strain has been associated with an increased risk of morbidity and mortality. Heart size is normal. No pericardial effusion. Aortic atherosclerosis. No aortic aneurysm or evidence of aortic dissection. Mediastinum/Nodes: No mass or enlarged lymph nodes within the mediastinum or perihilar regions. Esophagus is unremarkable. Trachea and central bronchi are unremarkable. Lungs/Pleura: Mild atelectasis at each lung base. No pleural effusion. Emphysematous changes at the lung apices, mild to moderate in degree. Upper Abdomen: No acute abnormality. Musculoskeletal: Mild degenerative spurring within the mid and lower thoracic spine. Mild chronic-appearing compression deformity of the T12 vertebral body. No acute appearing osseous abnormality. Superficial soft tissues are unremarkable. Review of the MIP images confirms the above findings. IMPRESSION: 1. Bilateral acute PE, as detailed above, with CT evidence of right heart strain (RV/LV Ratio = 1.23) consistent with at least  submassive (intermediate risk) PE. The presence of right heart strain has been associated with an increased risk of morbidity and mortality. 2. Mild atelectasis at each lung base. Lungs otherwise clear. Biapical emphysematous change. 3. Heart size is normal.  No pericardial effusion. 4. No aortic aneurysm or dissection. Aortic Atherosclerosis (ICD10-I70.0) and Emphysema (ICD10-J43.9). Critical Value/emergent results were called by telephone at the time of interpretation on 06/18/2017 at 5:15 pm to Dr. Roma Schanz , who verbally acknowledged these results. Electronically Signed   By: Franki Cabot M.D.   On: 06/18/2017 17:16        Scheduled Meds: . apixaban  10 mg Oral BID   Followed by  . [START ON 06/26/2017] apixaban  5 mg Oral BID  . aspirin EC  81 mg Oral QHS  . atorvastatin  20 mg Oral q1800  . fenofibrate  160 mg Oral Daily  . fluticasone  2 spray Each Nare Daily  . folic acid  1 mg Oral Daily  . insulin aspart  0-9 Units Subcutaneous TID WC  . levothyroxine  125 mcg Oral QAC breakfast  . lisinopril  5 mg Oral Daily  . multivitamin with minerals  1 tablet Oral Daily  . pantoprazole  40 mg Oral Daily  . pioglitazone  15 mg Oral Q breakfast  . vitamin B-12  1,000 mcg Oral Daily   Continuous Infusions:   LOS: 2 days        Georgette Shell, MD [facility]   If 7PM-7AM, please contact night-coverage [email or web address] Password [facility] 06/20/2017, 12:25 PM

## 2017-06-21 LAB — GLUCOSE, CAPILLARY
GLUCOSE-CAPILLARY: 117 mg/dL — AB (ref 65–99)
Glucose-Capillary: 101 mg/dL — ABNORMAL HIGH (ref 65–99)
Glucose-Capillary: 105 mg/dL — ABNORMAL HIGH (ref 65–99)
Glucose-Capillary: 132 mg/dL — ABNORMAL HIGH (ref 65–99)

## 2017-06-21 LAB — MRSA PCR SCREENING: MRSA by PCR: NEGATIVE

## 2017-06-21 MED ORDER — SODIUM CHLORIDE 0.9 % IV SOLN
INTRAVENOUS | Status: AC
  Administered 2017-06-21: 12:00:00 via INTRAVENOUS

## 2017-06-21 MED ORDER — SODIUM CHLORIDE 0.9 % IV SOLN
Freq: Once | INTRAVENOUS | Status: AC
  Administered 2017-06-21: 12:00:00 via INTRAVENOUS

## 2017-06-21 NOTE — Progress Notes (Signed)
Tech offered Pt a bath. Pt explained to Tech that someone had assisted him earlier this am with washing his face, chest and underarms. Tech is unsure about the events, therefore suggested to Pt that a full bath will be better. Pt then stated that he will wash is private area a little later.

## 2017-06-21 NOTE — Evaluation (Signed)
Occupational Therapy Evaluation Patient Details Name: Louis [name]. MRN: [id number] DOB: [birth year] Today's Date: 06/21/2017    History of Present Illness Pt is a 81 y.o. male who presented to the ED after CTA revealed bilateral pulmonary embolism. Pt had a 3 week history of shortness of breath with pleuritic-type chest pain PTA. Later found to have R LE DVT. PMH significant for pulmonary embolism, diabetes mellitus type 2, hypertension, hyperlipidemia, rheumatoid arthritis, and chronic kidney disease.     Clinical Impression   PTA, pt was independent with ADL and functional mobility. He is very active in the community and enjoys golfing. Pt currently requires supervision for standing grooming tasks and LB ADL and min guard assist for ambulating toilet transfers. He presents with decreased activity tolerance for ADL, generalized weakness, and shortness of breath impacting his ability to participate in ADL at [facility]. Pt would benefit from continued OT services while admitted to improve independence with and activity tolerance for ADL. Do not anticipate need for OT follow-up post-acute D/C. Will continue to follow while admitted.     Follow Up Recommendations  No OT follow up;Supervision/Assistance - 24 hour    Equipment Recommendations  None recommended by OT    Recommendations for Other Services       Precautions / Restrictions Precautions Precautions: Fall Restrictions Weight Bearing Restrictions: No      Mobility Bed Mobility               General bed mobility comments: OOB in chair on OT arrival.   Transfers Overall transfer level: Needs assistance Equipment used: None Transfers: Sit to/from Stand Sit to Stand: Supervision         General transfer comment: Supervision for sit<>stand but required min guard assist for safety once begining to mobilize.     Balance Overall balance assessment: Needs assistance Sitting-balance support: No upper extremity  supported;Feet supported Sitting balance-Leahy Scale: Good     Standing balance support: No upper extremity supported;Single extremity supported;During functional activity Standing balance-Leahy Scale: Fair                             ADL either performed or assessed with clinical judgement   ADL Overall ADL's : Needs assistance/impaired Eating/Feeding: Set up;Sitting   Grooming: Supervision/safety;Standing   Upper Body Bathing: Set up;Sitting   Lower Body Bathing: Supervison/ safety;Sit to/from stand   Upper Body Dressing : Set up;Sitting   Lower Body Dressing: Supervision/safety;Sit to/from stand   Toilet Transfer: Min guard;Ambulation   Toileting- Clothing Manipulation and Hygiene: Supervision/safety;Sit to/from stand       Functional mobility during ADLs: Min guard General ADL Comments: Pt slightly unsteady when ambulating to bathroom but able to complete standing ADL tasks with supervision.      Vision   Vision Assessment?: No apparent visual deficits     Perception     Praxis      Pertinent Vitals/Pain Pain Assessment: Faces Faces Pain Scale: No hurt     Hand Dominance     Extremity/Trunk Assessment Upper Extremity Assessment Upper Extremity Assessment: Overall WFL for tasks assessed;RUE deficits/detail;LUE deficits/detail RUE Deficits / Details: History of multiple rotator cuff repair surgeries. Reports continued rotator cuff pathology but managing non-surgically for the time being.  LUE Deficits / Details: History of 2 total shoulder arthroplasty procedures.    Lower Extremity Assessment Lower Extremity Assessment: Defer to PT evaluation       Communication Communication Communication: No  difficulties   Cognition Arousal/Alertness: Awake/alert Behavior During Therapy: WFL for tasks assessed/performed Overall Cognitive Status: Within Functional Limits for tasks assessed                                     General  Comments       Exercises     Shoulder Instructions      Home Living Family/patient expects to be discharged to:: Private residence Living Arrangements: Spouse/significant other Available Help at Discharge: Family Type of Home: House Home Access: Stairs to enter Secretary/administrator of Steps: 2 Entrance Stairs-Rails: Right Home Layout: Able to live on main level with bedroom/bathroom;Two level     Bathroom Shower/Tub: Chief Strategy Officer: Handicapped height     Home Equipment: Environmental consultant - 2 wheels;Cane - single point;Adaptive equipment          Prior Functioning/Environment Level of Independence: Independent                 OT Problem List: Decreased strength;Decreased activity tolerance;Impaired balance (sitting and/or standing);Decreased safety awareness;Cardiopulmonary status limiting activity      OT Treatment/Interventions: Self-care/ADL training;Therapeutic exercise;Energy conservation;DME and/or AE instruction;Therapeutic activities;Patient/family education;Balance training    OT Goals(Current goals can be found in the care plan section) Acute Rehab OT Goals Patient Stated Goal: to get better before going home OT Goal Formulation: With patient Time For Goal Achievement: 07/05/17 Potential to Achieve Goals: Good ADL Goals Pt Will Perform Grooming: with modified independence;sitting Pt Will Perform Lower Body Dressing: with modified independence;sit to/from stand Pt Will Transfer to Toilet: with modified independence;ambulating;bedside commode Pt Will Perform Toileting - Clothing Manipulation and hygiene: with modified independence;sit to/from stand Additional ADL Goal #1: Pt will independently verbalize 3 strategies to conserve energy during monring ADL routine.   OT Frequency: Min 2X/week   Barriers to D/C:            Co-evaluation              AM-PAC PT "6 Clicks" Daily Activity     Outcome Measure Help from another person  eating meals?: None Help from another person taking care of personal grooming?: A Little Help from another person toileting, which includes using toliet, bedpan, or urinal?: A Little Help from another person bathing (including washing, rinsing, drying)?: A Little Help from another person to put on and taking off regular upper body clothing?: None Help from another person to put on and taking off regular lower body clothing?: A Little 6 Click Score: 20   End of Session Nurse Communication: Mobility status  Activity Tolerance: Patient tolerated treatment well Patient left: in chair;with call bell/phone within reach  OT Visit Diagnosis: Unsteadiness on feet (R26.81);Muscle weakness (generalized) (M62.81)                Time: 1241-1311 OT Time Calculation (min): 30 min Charges:  OT General Charges $OT Visit: 1 Visit OT Evaluation $OT Eval Moderate Complexity: 1 Mod OT Treatments $Self Care/Home Management : 8-22 mins G-Codes:     Doristine Section, MS OTR/L  Pager: [phone]   Leith Hedlund A Teddi Badalamenti 06/21/2017, 3:05 PM

## 2017-06-21 NOTE — Care Management Note (Signed)
Case Management Note  Patient Details  Name: Louis [name]. MRN: [id number] Date of Birth: 04-01-1936  Subjective/Objective:   Pt admitted with bilateral PE                  Action/Plan:  PTA independent from home with wife.  Pt has PCP and denied barriers to obtaining/paying for medications at discharge.  CM will continue to follow for discharge needs   Expected Discharge Date:                  Expected Discharge Plan:  Home/Self Care  In-House Referral:     Discharge planning Services  CM Consult  Post Acute Care Choice:    Choice offered to:     DME Arranged:    DME Agency:     HH Arranged:    HH Agency:     Status of Service:     If discussed at [employer] of Stay Meetings, dates discussed:    Additional Comments:  Maryclare Labrador, RN 06/21/2017, 11:37 AM

## 2017-06-21 NOTE — Progress Notes (Addendum)
PROGRESS NOTE    Louis [name].  QZR:[id number] DOB: [birth year] DOA: 06/18/2017 PCP: Ann Held, DO    Brief Narrative: 81 year old male admitted with bilateral pulmonary embolism. This patient has a history of PE 3 years ago. Patient is being treated with Eliquis. Doppler of the lower extremity shows an acute DVT in the right lower extremity. Patient denies recent travel or recent surgery   Assessment & Plan:   Principal Problem:   Pulmonary embolism ([place]) Active Problems:   Hypothyroidism   Essential hypertension   DM (diabetes mellitus) type II, controlled, with peripheral vascular disorder (HCC)   Hemochromatosis   CKD (chronic kidney disease) stage 3, GFR 30-59 ml/min   Bilateral pulmonary embolism unprovoked continue Eliquis 10 mg twice a day for 7 days then 5 mg twice a day.  Hypertension patient with history of hypertension but blood pressure on the low to low-normal side will DC ACE inhibitor and monitor closely.will give himone bag of ivns.he appears to be dry which probably is causing him to be dizzzy.check orthostatics today.  Type 2 diabetes on Actos continue blood sugar stable.  CK D stable stage III creatinine 1.7 at baseline.  PVCs asymptomatic. All electrolytes within normal limits. He is not on a beta blocker to  borderline blood pressure.  Bradycardia patient not on any beta blockers. Cardiac enzymes normal. Will check EKG. If he continues to be bradycardic and symptomatic consult cardiology.   Anemia of chronic disease.stable.  hypothyroidsm continue synthroid.  RA MTX not reorderded     DVT prophylaxis: eliquis Code Status: full Family Communication await family to come in today. Disposition Plan: PT/OT EVAL.patient is not bedrest.?if he needs snf vs home with pt?   Consultants: none  Proceduresnone Antimicrobials:  none  Subjective:feels chest pain increse with breathing   Objective: Vitals:   06/20/17 1939  06/20/17 2336 06/21/17 0331 06/21/17 0800  BP: (!) 149/91 130/67 (!) 100/55 136/79  Pulse: 75 61 (!) 59 65  Resp: 17 13 16 16   Temp: 97.9 F (36.6 C) 98 F (36.7 C) (!) 97.4 F (36.3 C) (!) 97.5 F (36.4 C)  TempSrc: Oral Oral Oral Oral  SpO2: 98% 96% 94% 96%  Weight:      Height:        Intake/Output Summary (Last 24 hours) at 06/21/17 1028 Last data filed at 06/21/17 3545  Gross per 24 hour  Intake              480 ml  Output              500 ml  Net              -20 ml   Filed Weights   06/18/17 1726 06/19/17 0116  Weight: 100.2 kg (221 lb) 99.6 kg (219 lb 9.3 oz)    Examination:  General exam: Appears calm and comfortable  Respiratory system: Clear to auscultation. Respiratory effort normal. Cardiovascular system: S1 & S2 heard, RRR. No JVD, murmurs, rubs, gallops or clicks. No pedal edema. Gastrointestinal system: Abdomen is nondistended, soft and nontender. No organomegaly or masses felt. Normal bowel sounds heard. Central nervous system: Alert and oriented. No focal neurological deficits. Extremities: Symmetric 5 x 5 power. Skin: No rashes, lesions or ulcers Psychiatry: Judgement and insight appear normal. Mood & affect appropriate.     Data Reviewed: I have personally reviewed following labs and imaging studies  CBC:  Recent Labs Lab 06/18/17 1216 06/18/17 1751 06/19/17 0146 06/19/17  6160 06/20/17 0231  WBC 6.2 6.8 6.7 6.0 4.4  NEUTROABS 3.9 3.7  --  3.3  --   HGB 12.0* 11.7* 11.4* 10.7* 10.2*  HCT 37.4* 35.8* 36.0* 32.9* 32.1*  MCV 93.9 93.5 92.5 92.2 92.8  PLT 412.0* 399 367 323 737   Basic Metabolic Panel:  Recent Labs Lab 06/18/17 1216 06/18/17 1751 06/19/17 0609 06/20/17 0231  NA 141 138 138 138  K 4.2 3.8 3.9 3.9  CL 107 104 107 109  CO2 26 25 24 22   GLUCOSE 148* 105* 100* 102*  BUN 23 22* 20 19  CREATININE 1.87* 1.88* 1.84* 1.71*  CALCIUM 9.2 8.8* 8.6* 8.5*  MG  --   --   --  2.0  PHOS  --   --   --  3.2   GFR: Estimated  Creatinine Clearance: 42.1 mL/min (A) (by C-G formula based on SCr of 1.71 mg/dL (H)). Liver Function Tests:  Recent Labs Lab 06/18/17 1751 06/19/17 0609  AST 27 27  ALT 16* 17  ALKPHOS 64 48  BILITOT 0.6 0.4  PROT 7.7 5.9*  ALBUMIN 4.0 3.4*   No results for input(s): LIPASE, AMYLASE in the last 168 hours. No results for input(s): AMMONIA in the last 168 hours. Coagulation Profile:  Recent Labs Lab 06/18/17 1751  INR 1.02   Cardiac Enzymes:  Recent Labs Lab 06/18/17 1752 06/19/17 0609 06/19/17 1103 06/19/17 1759  TROPONINI <0.03 <0.03 <0.03 <0.03   BNP (last 3 results) No results for input(s): PROBNP in the last 8760 hours. HbA1C: No results for input(s): HGBA1C in the last 72 hours. CBG:  Recent Labs Lab 06/20/17 0824 06/20/17 1316 06/20/17 1634 06/20/17 2135 06/21/17 0836  GLUCAP 101* 103* 121* 119* 101*   Lipid Profile: No results for input(s): CHOL, HDL, LDLCALC, TRIG, CHOLHDL, LDLDIRECT in the last 72 hours. Thyroid Function Tests: No results for input(s): TSH, T4TOTAL, FREET4, T3FREE, THYROIDAB in the last 72 hours. Anemia Panel: No results for input(s): VITAMINB12, FOLATE, FERRITIN, TIBC, IRON, RETICCTPCT in the last 72 hours. Sepsis Labs: No results for input(s): PROCALCITON, LATICACIDVEN in the last 168 hours.  Recent Results (from the past 240 hour(s))  MRSA PCR Screening     Status: None   Collection Time: 06/21/17  4:14 AM  Result Value Ref Range Status   MRSA by PCR NEGATIVE NEGATIVE Final    Comment:        The GeneXpert MRSA Assay (FDA approved for NASAL specimens only), is one component of a comprehensive MRSA colonization surveillance program. It is not intended to diagnose MRSA infection nor to guide or monitor treatment for MRSA infections.          Radiology Studies: No results found.      Scheduled Meds: . apixaban  10 mg Oral BID   Followed by  . [START ON 06/26/2017] apixaban  5 mg Oral BID  . aspirin EC   81 mg Oral QHS  . atorvastatin  20 mg Oral q1800  . fenofibrate  160 mg Oral Daily  . fluticasone  2 spray Each Nare Daily  . folic acid  1 mg Oral Daily  . insulin aspart  0-9 Units Subcutaneous TID WC  . levothyroxine  125 mcg Oral QAC breakfast  . multivitamin with minerals  1 tablet Oral Daily  . pantoprazole  40 mg Oral Daily  . pioglitazone  15 mg Oral Q breakfast  . vitamin B-12  1,000 mcg Oral Daily   Continuous Infusions:  LOS: 3 days        Georgette Shell, MD [facility]  If 7PM-7AM, please contact night-coverage [email or web address] Password [facility] 06/21/2017, 10:28 AM

## 2017-06-21 NOTE — Evaluation (Signed)
Physical Therapy Evaluation Patient Details Name: Louis [name]. MRN: [id number] DOB: [birth year] Today's Date: 06/21/2017   History of Present Illness  Pt is a 81 y.o. male who presented to the ED after CTA revealed bilateral pulmonary embolism. Pt had a 3 week history of shortness of breath with pleuritic-type chest pain PTA. Later found to have R LE DVT. PMH significant for pulmonary embolism, diabetes mellitus type 2, hypertension, hyperlipidemia, rheumatoid arthritis, and chronic kidney disease.    Clinical Impression  Patient presents with decreased balance and limited activity tolerance reporting he has to take deep breaths every so often and feels "woozy".  Completed vestibular assessment and noted no signs of inner ear pathology at this time.  Feel symptoms may be related to illness or medication.  Will continue to follow for skilled PT in the acute setting and may need HHPT follow up at d/c.     Follow Up Recommendations Home health PT    Equipment Recommendations  None recommended by PT    Recommendations for Other Services       Precautions / Restrictions Precautions Precautions: Fall Restrictions Weight Bearing Restrictions: No      Mobility  Bed Mobility Overal bed mobility: Needs Assistance Bed Mobility: Supine to Sit     Supine to sit: Supervision     General bed mobility comments: assist for lines  Transfers Overall transfer level: Needs assistance Equipment used: None Transfers: Sit to/from Stand Sit to Stand: Supervision         General transfer comment: assist for lines, bracing legs against side of bed  Ambulation/Gait Ambulation/Gait assistance: Min guard;Min assist Ambulation Distance (Feet): 150 Feet Assistive device: None;1 person hand held assist Gait Pattern/deviations: Step-through pattern;Decreased stride length;Shuffle     General Gait Details: cues for upright posture, initialy close S to minguard, then pt at times feeling  more unsteady when turning and when looking down so hand hold assist provided  Stairs            Wheelchair Mobility    Modified Rankin (Stroke Patients Only)       Balance Overall balance assessment: Needs assistance Sitting-balance support: No upper extremity supported;Feet supported Sitting balance-Leahy Scale: Good     Standing balance support: No upper extremity supported;Single extremity supported;During functional activity Standing balance-Leahy Scale: Good Standing balance comment: can mobilize no UE support, but definately feels little unsteady                             Pertinent Vitals/Pain Pain Assessment: Faces Faces Pain Scale: No hurt    Home Living Family/patient expects to be discharged to:: Private residence Living Arrangements: Spouse/significant other Available Help at Discharge: Family Type of Home: House Home Access: Stairs to enter Entrance Stairs-Rails: Right Entrance Stairs-Number of Steps: 2 Home Layout: Able to live on main level with bedroom/bathroom;Two level Home Equipment: Walker - 2 wheels;Cane - single point;Adaptive equipment      Prior Function Level of Independence: Independent               Hand Dominance   Dominant Hand: Right    Extremity/Trunk Assessment   Upper Extremity Assessment Upper Extremity Assessment: Defer to OT evaluation RUE Deficits / Details: History of multiple rotator cuff repair surgeries. Reports continued rotator cuff pathology but managing non-surgically for the time being.  LUE Deficits / Details: History of 2 total shoulder arthroplasty procedures.     Lower Extremity Assessment  Lower Extremity Assessment: Overall WFL for tasks assessed       Communication   Communication: No difficulties  Cognition Arousal/Alertness: Awake/alert Behavior During Therapy: WFL for tasks assessed/performed Overall Cognitive Status: Within Functional Limits for tasks assessed                                         General Comments General comments (skin integrity, edema, etc.): c/o Lightheaded feeling, performed vestibular testing and noted no abnormality        Vestibular Assessment - 06/21/17 1517      Vestibular Assessment   General Observation c/o dizzy esp with supine to sit "woozy"     Symptom Behavior   Type of Dizziness Lightheadedness   Frequency of Dizziness intermittent   Duration of Dizziness several minutes   Aggravating Factors Supine to sit;Turning body quickly   Relieving Factors Slow movements;Rest     Occulomotor Exam   Occulomotor Alignment Normal   Spontaneous Absent   Gaze-induced Absent   Smooth Pursuits Intact   Saccades Intact     Vestibulo-Occular Reflex   VOR 1 Head Only (x 1 viewing) intact to horizontal and vertical movements x 30 sec eacg   VOR to Slow Head Movement Negative left;Normal;Negative right   VOR Cancellation Normal     Positional Testing   Sidelying Test Sidelying Right;Sidelying Left     Sidelying Right   Sidelying Right Duration 30   Sidelying Right Symptoms No nystagmus     Sidelying Left   Sidelying Left Duration 30   Sidelying Left Symptoms No nystagmus        Exercises     Assessment/Plan    PT Assessment Patient needs continued PT services  PT Problem List Decreased strength;Decreased activity tolerance;Decreased balance;Decreased knowledge of use of DME;Decreased mobility       PT Treatment Interventions DME instruction;Therapeutic activities;Gait training;Therapeutic exercise;Patient/family education;Stair training;Balance training;Functional mobility training    PT Goals (Current goals can be found in the Care Plan section)  Acute Rehab PT Goals Patient Stated Goal: to get better before going home PT Goal Formulation: With patient Time For Goal Achievement: 06/27/17 Potential to Achieve Goals: Good    Frequency Min 3X/week   Barriers to discharge         Co-evaluation               AM-PAC PT "6 Clicks" Daily Activity  Outcome Measure Difficulty turning over in bed (including adjusting bedclothes, sheets and blankets)?: None Difficulty moving from lying on back to sitting on the side of the bed? : A Little Difficulty sitting down on and standing up from a chair with arms (e.g., wheelchair, bedside commode, etc,.)?: A Little Help needed moving to and from a bed to chair (including a wheelchair)?: A Little Help needed walking in hospital room?: A Little Help needed climbing 3-5 steps with a railing? : A Little 6 Click Score: 19    End of Session Equipment Utilized During Treatment: Gait belt Activity Tolerance: Patient tolerated treatment well Patient left: in chair;with call bell/phone within reach   PT Visit Diagnosis: Other abnormalities of gait and mobility (R26.89);Muscle weakness (generalized) (M62.81);Dizziness and giddiness (R42)    Time: 9702-6378 PT Time Calculation (min) (ACUTE ONLY): 32 min   Charges:   PT Evaluation $PT Eval Moderate Complexity: 1 Mod PT Treatments $Gait Training: 8-22 mins   PT G Codes:  [place], [place] 06/21/2017   Reginia Naas 06/21/2017, 3:23 PM

## 2017-06-22 LAB — CBC WITH DIFFERENTIAL/PLATELET
BASOS ABS: 0 10*3/uL (ref 0.0–0.1)
Basophils Relative: 0 %
EOS PCT: 6 %
Eosinophils Absolute: 0.4 10*3/uL (ref 0.0–0.7)
HCT: 34 % — ABNORMAL LOW (ref 39.0–52.0)
HEMOGLOBIN: 10.4 g/dL — AB (ref 13.0–17.0)
LYMPHS ABS: 2.5 10*3/uL (ref 0.7–4.0)
LYMPHS PCT: 38 %
MCH: 28.4 pg (ref 26.0–34.0)
MCHC: 30.6 g/dL (ref 30.0–36.0)
MCV: 92.9 fL (ref 78.0–100.0)
Monocytes Absolute: 0.4 10*3/uL (ref 0.1–1.0)
Monocytes Relative: 6 %
NEUTROS ABS: 3.3 10*3/uL (ref 1.7–7.7)
NEUTROS PCT: 50 %
PLATELETS: 328 10*3/uL (ref 150–400)
RBC: 3.66 MIL/uL — AB (ref 4.22–5.81)
RDW: 15.3 % (ref 11.5–15.5)
WBC: 6.6 10*3/uL (ref 4.0–10.5)

## 2017-06-22 LAB — BASIC METABOLIC PANEL
ANION GAP: 7 (ref 5–15)
BUN: 21 mg/dL — AB (ref 6–20)
CHLORIDE: 110 mmol/L (ref 101–111)
CO2: 22 mmol/L (ref 22–32)
Calcium: 8.5 mg/dL — ABNORMAL LOW (ref 8.9–10.3)
Creatinine, Ser: 1.75 mg/dL — ABNORMAL HIGH (ref 0.61–1.24)
GFR calc Af Amer: 41 mL/min — ABNORMAL LOW (ref >60)
GFR, EST NON AFRICAN AMERICAN: 35 mL/min — AB (ref >60)
Glucose, Bld: 110 mg/dL — ABNORMAL HIGH (ref 65–99)
POTASSIUM: 4.2 mmol/L (ref 3.5–5.1)
SODIUM: 139 mmol/L (ref 135–145)

## 2017-06-22 LAB — GLUCOSE, CAPILLARY: Glucose-Capillary: 92 mg/dL (ref 65–99)

## 2017-06-22 MED ORDER — APIXABAN 5 MG PO TABS: 10.0000 mg | ORAL_TABLET | Freq: Two times a day (BID) | ORAL | 0 refills | Status: DC

## 2017-06-22 MED ORDER — APIXABAN 5 MG PO TABS: 5.0000 mg | ORAL_TABLET | Freq: Two times a day (BID) | ORAL | 0 refills | Status: DC

## 2017-06-22 MED ORDER — PANTOPRAZOLE SODIUM 40 MG PO TBEC: 40.0000 mg | DELAYED_RELEASE_TABLET | Freq: Every day | ORAL | 0 refills | Status: DC

## 2017-06-22 NOTE — Progress Notes (Signed)
Discharge instructions given to patient and patients wife, all questions answered at this time.  Pt. VSS with no s/s of distress noted.  Patient stable for discharge.

## 2017-06-22 NOTE — Discharge Summary (Signed)
Physician Discharge Summary  Louis [name]. DEY:[id number] DOB: 1936-03-28 DOA: 06/18/2017  PCP: Ann Held, DO  Admit date: 06/18/2017 Discharge date: 06/22/2017  Admitted From: home Disposition:  home  Recommendations for Outpatient Follow-up:  1. Follow up with PCP in 1-2 weeks 2. Please obtain BMP/CBC in one week  Home Healthyes Equipment/Devices:none  Discharge Condition:stable CODE STATusfull Diet recommendation: modified carb heart healthy  Brief/Interim SummaryBrief Narrative: 81 year old male admitted with bilateral pulmonary embolism. This patient has a history of PE 3 years ago. Patient is being treated with Eliquis. Doppler of the lower extremity shows an acute DVT in the right lower extremity. Patient denies recent travel or recent surgery.patient was treated with Eliquis.he remained stable.    Discharge Diagnoses:  Principal Problem:   Pulmonary embolism (HCC) Active Problems:   Hypothyroidism   Essential hypertension   DM (diabetes mellitus) type II, controlled, with peripheral vascular disorder (HCC)   Hemochromatosis   CKD (chronic kidney disease) stage 3, GFR 30-59 ml/min B/L PE continue Eliquis indefinitely.follow up with pcp.   Discharge Instructions   Allergies as of 06/22/2017      Reactions   Shellfish Allergy Anaphylaxis   ANGIOEDEMA   Garlic Nausea And Vomiting   Iron Other (See Comments)   hemacomatosis      Medication List    STOP taking these medications   diphenhydrAMINE 25 mg capsule Commonly known as:  BENADRYL   doxycycline 100 MG tablet Commonly known as:  VIBRA-TABS   lisinopril 5 MG tablet Commonly known as:  PRINIVIL,ZESTRIL   omeprazole 20 MG tablet Commonly known as:  PRILOSEC OTC Replaced by:  pantoprazole 40 MG tablet   ondansetron 4 MG tablet Commonly known as:  ZOFRAN     TAKE these medications   ALPRAZolam 0.5 MG tablet Commonly known as:  XANAX TAKE ONE-HALF BY MOUTH EVERY 8 TO 12 HOURS AS  NEEDED What changed:  how much to take  how to take this  when to take this  reasons to take this  additional instructions   apixaban 5 MG Tabs tablet Commonly known as:  ELIQUIS Take 2 tablets (10 mg total) by mouth 2 (two) times daily.   apixaban 5 MG Tabs tablet Commonly known as:  ELIQUIS Take 1 tablet (5 mg total) by mouth 2 (two) times daily. Start taking on:  06/26/2017   aspirin EC 81 MG tablet Take 81 mg by mouth at bedtime.   atorvastatin 20 MG tablet Commonly known as:  LIPITOR TAKE 1 TABLET DAILY AT 6 P.M. What changed:  See the new instructions.   CALCIUM-VITAMIN D PO Take 1 tablet by mouth daily.   fenofibrate 160 MG tablet TAKE 1 TABLET(160 MG) BY MOUTH DAILY   fluticasone 50 MCG/ACT nasal spray Commonly known as:  FLONASE Place 2 sprays into both nostrils daily.   folic acid 1 MG tablet Commonly known as:  FOLVITE Take 1 mg by mouth daily.   HYDROcodone-acetaminophen 10-325 MG tablet Commonly known as:  NORCO Take 1 tablet by mouth every 4 (four) hours as needed for moderate pain or severe pain (pain).   levocetirizine 5 MG tablet Commonly known as:  XYZAL Take 1 tablet (5 mg total) by mouth every evening.   levothyroxine 125 MCG tablet Commonly known as:  SYNTHROID, LEVOTHROID TAKE 1 TABLET(125 MCG) BY MOUTH DAILY What changed:  how much to take  how to take this  when to take this  additional instructions   methocarbamol 500 MG tablet  Commonly known as:  ROBAXIN Take 1 tablet (500 mg total) by mouth every 6 (six) hours as needed for muscle spasms.   methotrexate 2.5 MG tablet Commonly known as:  RHEUMATREX Take 20 mg by mouth every Monday.   multivitamin per tablet Take 1 tablet by mouth daily.   pantoprazole 40 MG tablet Commonly known as:  PROTONIX Take 1 tablet (40 mg total) by mouth daily. Replaces:  omeprazole 20 MG tablet   pioglitazone 15 MG tablet Commonly known as:  ACTOS TAKE 1 TABLET DAILY What changed:   See the new instructions.   vitamin B-12 1000 MCG tablet Commonly known as:  CYANOCOBALAMIN Take 1,000 mcg by mouth daily.            Discharge Care Instructions        Start     Ordered   06/26/17 0000  apixaban (ELIQUIS) 5 MG TABS tablet  2 times daily     06/22/17 0923   06/22/17 0000  apixaban (ELIQUIS) 5 MG TABS tablet  2 times daily     06/22/17 0924   06/22/17 0000  pantoprazole (PROTONIX) 40 MG tablet  Daily     06/22/17 0924      Allergies  Allergen Reactions  . Shellfish Allergy Anaphylaxis    ANGIOEDEMA  . Garlic Nausea And Vomiting  . Iron Other (See Comments)    hemacomatosis    Consultations:   Procedures/Studies: Dg Chest 2 View  Result Date: 06/18/2017 CLINICAL DATA:  Shortness of breath for 3 weeks with some left-sided chest pain. EXAM: CHEST  2 VIEW COMPARISON:  CT chest 08/17/2014.  PA and lateral chest 03/05/2013. FINDINGS: The lungs are emphysematous but clear. Heart size is normal. Aortic atherosclerosis is noted. Postoperative change both shoulders is seen. No acute bony abnormality. IMPRESSION: Emphysema without acute disease. Atherosclerosis. Electronically Signed   By: Inge Rise M.D.   On: 06/18/2017 12:44   Ct Angio Chest W/cm &/or Wo Cm  Result Date: 06/18/2017 CLINICAL DATA:  Shortness of breath and occasional chest pain for 3 weeks. History of pulmonary embolism in the past. EXAM: CT ANGIOGRAPHY CHEST WITH CONTRAST TECHNIQUE: Multidetector CT imaging of the chest was performed using the standard protocol during bolus administration of intravenous contrast. Multiplanar CT image reconstructions and MIPs were obtained to evaluate the vascular anatomy. CONTRAST:  80 cc Isovue 370 COMPARISON:  Chest CT angiogram dated 08/17/2014. FINDINGS: Cardiovascular: Moderate load of pulmonary embolism noted at the distal aspect of the right main pulmonary artery extending into the right interlobar pulmonary artery (nearly occlusive) and into multiple  central segmental pulmonary artery branches to the right upper lobe, right middle lobe and right lower lobe. Smaller amount of pulmonary embolism is seen within the distal portion of the left main pulmonary artery extending into the left interlobar pulmonary artery and into multiple segmental pulmonary artery branches to the left upper lobe, lingula and left lower lobe. Positive for acute PE with CTevidence of right heart strain (RV/LV Ratio = 1.23) consistent with at least submassive (intermediate risk) PE. The presence of right heart strain has been associated with an increased risk of morbidity and mortality. Heart size is normal. No pericardial effusion. Aortic atherosclerosis. No aortic aneurysm or evidence of aortic dissection. Mediastinum/Nodes: No mass or enlarged lymph nodes within the mediastinum or perihilar regions. Esophagus is unremarkable. Trachea and central bronchi are unremarkable. Lungs/Pleura: Mild atelectasis at each lung base. No pleural effusion. Emphysematous changes at the lung apices, mild to moderate in  degree. Upper Abdomen: No acute abnormality. Musculoskeletal: Mild degenerative spurring within the mid and lower thoracic spine. Mild chronic-appearing compression deformity of the T12 vertebral body. No acute appearing osseous abnormality. Superficial soft tissues are unremarkable. Review of the MIP images confirms the above findings. IMPRESSION: 1. Bilateral acute PE, as detailed above, with CT evidence of right heart strain (RV/LV Ratio = 1.23) consistent with at least submassive (intermediate risk) PE. The presence of right heart strain has been associated with an increased risk of morbidity and mortality. 2. Mild atelectasis at each lung base. Lungs otherwise clear. Biapical emphysematous change. 3. Heart size is normal.  No pericardial effusion. 4. No aortic aneurysm or dissection. Aortic Atherosclerosis (ICD10-I70.0) and Emphysema (ICD10-J43.9). Critical Value/emergent results were  called by telephone at the time of interpretation on 06/18/2017 at 5:15 pm to Dr. Roma Schanz , who verbally acknowledged these results. Electronically Signed   By: Franki Cabot M.D.   On: 06/18/2017 17:16    (Echo, Carotid, EGD, Colonoscopy, ERCP)    Subjective:feeling good.walked with therapy.   Discharge Exam: Vitals:   06/22/17 0632 06/22/17 0825  BP:  117/79  Pulse: 79 (!) 54  Resp: 20 14  Temp:  97.9 F (36.6 C)  SpO2: 96% 97%   Vitals:   06/22/17 0329 06/22/17 0400 06/22/17 0632 06/22/17 0825  BP: 124/80   117/79  Pulse: 70 (!) 51 79 (!) 54  Resp: 20 (!) 0 20 14  Temp: (!) 97.4 F (36.3 C)   97.9 F (36.6 C)  TempSrc: Oral   Oral  SpO2: 100% 95% 96% 97%  Weight:      Height:        General: Pt is alert, awake, not in acute distress Cardiovascular: RRR, S1/S2 +, no rubs, no gallops Respiratory: CTA bilaterally, no wheezing, no rhonchi Abdominal: Soft, NT, ND, bowel sounds + Extremities: no edema, no cyanosis    The results of significant diagnostics from this hospitalization (including imaging, microbiology, ancillary and laboratory) are listed below for reference.     Microbiology: Recent Results (from the past 240 hour(s))  MRSA PCR Screening     Status: None   Collection Time: 06/21/17  4:14 AM  Result Value Ref Range Status   MRSA by PCR NEGATIVE NEGATIVE Final    Comment:        The GeneXpert MRSA Assay (FDA approved for NASAL specimens only), is one component of a comprehensive MRSA colonization surveillance program. It is not intended to diagnose MRSA infection nor to guide or monitor treatment for MRSA infections.      Labs: BNP (last 3 results)  Recent Labs  06/18/17 1753  BNP 40.9   Basic Metabolic Panel:  Recent Labs Lab 06/18/17 1216 06/18/17 1751 06/19/17 0609 06/20/17 0231 06/22/17 0322  NA 141 138 138 138 139  K 4.2 3.8 3.9 3.9 4.2  CL 107 104 107 109 110  CO2 26 25 24 22 22   GLUCOSE 148* 105* 100* 102*  110*  BUN 23 22* 20 19 21*  CREATININE 1.87* 1.88* 1.84* 1.71* 1.75*  CALCIUM 9.2 8.8* 8.6* 8.5* 8.5*  MG  --   --   --  2.0  --   PHOS  --   --   --  3.2  --    Liver Function Tests:  Recent Labs Lab 06/18/17 1751 06/19/17 0609  AST 27 27  ALT 16* 17  ALKPHOS 64 48  BILITOT 0.6 0.4  PROT 7.7 5.9*  ALBUMIN  4.0 3.4*   No results for input(s): LIPASE, AMYLASE in the last 168 hours. No results for input(s): AMMONIA in the last 168 hours. CBC:  Recent Labs Lab 06/18/17 1216 06/18/17 1751 06/19/17 0146 06/19/17 0609 06/20/17 0231 06/22/17 0322  WBC 6.2 6.8 6.7 6.0 4.4 6.6  NEUTROABS 3.9 3.7  --  3.3  --  3.3  HGB 12.0* 11.7* 11.4* 10.7* 10.2* 10.4*  HCT 37.4* 35.8* 36.0* 32.9* 32.1* 34.0*  MCV 93.9 93.5 92.5 92.2 92.8 92.9  PLT 412.0* 399 367 323 321 328   Cardiac Enzymes:  Recent Labs Lab 06/18/17 1752 06/19/17 0609 06/19/17 1103 06/19/17 1759  TROPONINI <0.03 <0.03 <0.03 <0.03   BNP: Invalid input(s): POCBNP CBG:  Recent Labs Lab 06/20/17 2135 06/21/17 0836 06/21/17 1210 06/21/17 1613 06/21/17 2144  GLUCAP 119* 101* 105* 117* 132*   D-Dimer No results for input(s): DDIMER in the last 72 hours. Hgb A1c No results for input(s): HGBA1C in the last 72 hours. Lipid Profile No results for input(s): CHOL, HDL, LDLCALC, TRIG, CHOLHDL, LDLDIRECT in the last 72 hours. Thyroid function studies No results for input(s): TSH, T4TOTAL, T3FREE, THYROIDAB in the last 72 hours.  Invalid input(s): FREET3 Anemia work up No results for input(s): VITAMINB12, FOLATE, FERRITIN, TIBC, IRON, RETICCTPCT in the last 72 hours. Urinalysis    Component Value Date/Time   COLORURINE YELLOW 06/18/2017 1752   APPEARANCEUR CLEAR 06/18/2017 1752   LABSPEC 1.010 06/18/2017 1752   PHURINE 7.0 06/18/2017 1752   GLUCOSEU NEGATIVE 06/18/2017 1752   HGBUR TRACE (A) 06/18/2017 1752   HGBUR large 08/28/2010 1412   BILIRUBINUR NEGATIVE 06/18/2017 1752   BILIRUBINUR neg 10/09/2013  1637   KETONESUR NEGATIVE 06/18/2017 1752   PROTEINUR NEGATIVE 06/18/2017 1752   UROBILINOGEN 0.2 10/09/2013 1637   UROBILINOGEN 0.2 06/11/2011 1317   NITRITE NEGATIVE 06/18/2017 1752   LEUKOCYTESUR NEGATIVE 06/18/2017 1752   Sepsis Labs Invalid input(s): PROCALCITONIN,  WBC,  LACTICIDVEN Microbiology Recent Results (from the past 240 hour(s))  MRSA PCR Screening     Status: None   Collection Time: 06/21/17  4:14 AM  Result Value Ref Range Status   MRSA by PCR NEGATIVE NEGATIVE Final    Comment:        The GeneXpert MRSA Assay (FDA approved for NASAL specimens only), is one component of a comprehensive MRSA colonization surveillance program. It is not intended to diagnose MRSA infection nor to guide or monitor treatment for MRSA infections.      Time coordinating discharge: Over 30 minutes  SIGNED:   Georgette Shell, MD  [facility] 06/22/2017, 9:25 AM   If 7PM-7AM, please contact night-coverage [email or web address] Password [email or web address]

## 2017-06-22 NOTE — Care Management Note (Signed)
Case Management Note  Patient Details  Name: Louis [name]. MRN: [id number] Date of Birth: [birth year]  Subjective/Objective:    Patient for discharge today, NCM spoke with wife , she chose [facility] for [place], referral made to [facility] with [facility], Soc will begin 24-48hrs post dc.                 Action/Plan: Dc home with [place], HHPT with [facility].   Expected Discharge Date:  06/22/17               Expected Discharge Plan:  [place]  In-House Referral:     Discharge planning Services  CM Consult  Post Acute Care Choice:  Home Health Choice offered to:  Spouse  DME Arranged:    DME Agency:     HH Arranged:  RN, PT [place] Agency:  [place]  Status of Service:  Completed, signed off  If discussed at [place] of Stay Meetings, dates discussed:    Additional Comments:  Zenon Mayo, RN 06/22/2017, 2:47 PM

## 2017-06-24 ENCOUNTER — Telehealth: Payer: Self-pay | Admitting: Behavioral Health

## 2017-06-24 LAB — GLUCOSE, CAPILLARY: Glucose-Capillary: 99 mg/dL (ref 65–99)

## 2017-06-24 NOTE — Telephone Encounter (Signed)
Transition Care Management Follow-up Telephone Call  PCP: Ann Held, DO  Admit date: 06/18/2017 Discharge date: 06/22/2017  Admitted From: home Disposition:  home  Recommendations for Outpatient Follow-up:  1. Follow up with PCP in 1-2 weeks 2. Please obtain BMP/CBC in one week  Home Healthyes Equipment/Devices:none  Discharge Condition:stable   How have you been since you were released from the hospital? Per patient's wife, "he's doing well, but does not notice a great deal of change in his breathing; still at times have shortness of breath".   Do you understand why you were in the hospital? yes, per spouse, "he had clots in both the right & left lungs; also a clot in the left leg".   Do you understand the discharge instructions? yes   Where were you discharged to? Home with wife.   Items Reviewed:  Medications reviewed: no, patient's spouse will review medications at the upcoming appointment with PCP.  Allergies reviewed: yes  Dietary changes reviewed: yes, heart healthy/carb-modified diet.  Referrals reviewed: yes, Follow up with PCP in 1-2 weeks;  Please obtain BMP/CBC in one week   Functional Questionnaire:   Activities of Daily Living (ADLs):   He states they are independent in the following: ambulation, bathing and hygiene, feeding, continence, grooming, toileting and dressing States they require assistance with the following: None   Any transportation issues/concerns?: no   Any patient concerns? yes, per patient's wife, she's concerned about some of the medications that were discontinued in the hospital.   Confirmed importance and date/time of follow-up visits scheduled yes, 06/27/17 at 11:30 AM.  Provider Appointment booked with Dr. Carollee Herter.  Confirmed with patient if condition begins to worsen call PCP or go to the ER.  Patient was given the office number and encouraged to call back with question or concerns.  : yes

## 2017-06-24 NOTE — Telephone Encounter (Signed)
TCM/Hospital Follow-up appointment scheduled for 06/27/17 at 11:30 AM with Dr. Carollee Herter.

## 2017-06-25 ENCOUNTER — Telehealth: Payer: Self-pay | Admitting: *Deleted

## 2017-06-25 ENCOUNTER — Telehealth: Payer: Self-pay | Admitting: Family Medicine

## 2017-06-25 DIAGNOSIS — Z7982 Long term (current) use of aspirin: Secondary | ICD-10-CM | POA: Diagnosis not present

## 2017-06-25 DIAGNOSIS — N183 Chronic kidney disease, stage 3 (moderate): Secondary | ICD-10-CM | POA: Diagnosis not present

## 2017-06-25 DIAGNOSIS — Z720 Tobacco use: Secondary | ICD-10-CM | POA: Diagnosis not present

## 2017-06-25 DIAGNOSIS — Z86711 Personal history of pulmonary embolism: Secondary | ICD-10-CM | POA: Diagnosis not present

## 2017-06-25 DIAGNOSIS — E785 Hyperlipidemia, unspecified: Secondary | ICD-10-CM | POA: Diagnosis not present

## 2017-06-25 DIAGNOSIS — D631 Anemia in chronic kidney disease: Secondary | ICD-10-CM | POA: Diagnosis not present

## 2017-06-25 DIAGNOSIS — I129 Hypertensive chronic kidney disease with stage 1 through stage 4 chronic kidney disease, or unspecified chronic kidney disease: Secondary | ICD-10-CM | POA: Diagnosis not present

## 2017-06-25 DIAGNOSIS — K589 Irritable bowel syndrome without diarrhea: Secondary | ICD-10-CM | POA: Diagnosis not present

## 2017-06-25 DIAGNOSIS — I2699 Other pulmonary embolism without acute cor pulmonale: Secondary | ICD-10-CM | POA: Diagnosis not present

## 2017-06-25 DIAGNOSIS — Z7984 Long term (current) use of oral hypoglycemic drugs: Secondary | ICD-10-CM | POA: Diagnosis not present

## 2017-06-25 DIAGNOSIS — K5791 Diverticulosis of intestine, part unspecified, without perforation or abscess with bleeding: Secondary | ICD-10-CM | POA: Diagnosis not present

## 2017-06-25 DIAGNOSIS — E039 Hypothyroidism, unspecified: Secondary | ICD-10-CM | POA: Diagnosis not present

## 2017-06-25 DIAGNOSIS — M069 Rheumatoid arthritis, unspecified: Secondary | ICD-10-CM | POA: Diagnosis not present

## 2017-06-25 DIAGNOSIS — E1122 Type 2 diabetes mellitus with diabetic chronic kidney disease: Secondary | ICD-10-CM | POA: Diagnosis not present

## 2017-06-25 NOTE — Telephone Encounter (Signed)
He was d/c from hosp --  What do they need

## 2017-06-25 NOTE — Telephone Encounter (Signed)
Received Physician Orders from [facility]; forwarded to provider/SLS 09/25

## 2017-06-25 NOTE — Telephone Encounter (Signed)
I'm out of office

## 2017-06-25 NOTE — Telephone Encounter (Signed)
Caller name: Cecille Rubin  Relation to pt: RN from [place]  Call back number: [phone]  Pharmacy:  Reason for call:  Verbal orders for nursing care for Pulmonary embolism, and diabetes and hypertension, please advise

## 2017-06-26 ENCOUNTER — Telehealth: Payer: Self-pay | Admitting: Family Medicine

## 2017-06-26 DIAGNOSIS — I129 Hypertensive chronic kidney disease with stage 1 through stage 4 chronic kidney disease, or unspecified chronic kidney disease: Secondary | ICD-10-CM | POA: Diagnosis not present

## 2017-06-26 DIAGNOSIS — K589 Irritable bowel syndrome without diarrhea: Secondary | ICD-10-CM | POA: Diagnosis not present

## 2017-06-26 DIAGNOSIS — K5791 Diverticulosis of intestine, part unspecified, without perforation or abscess with bleeding: Secondary | ICD-10-CM | POA: Diagnosis not present

## 2017-06-26 DIAGNOSIS — N183 Chronic kidney disease, stage 3 (moderate): Secondary | ICD-10-CM | POA: Diagnosis not present

## 2017-06-26 DIAGNOSIS — I2699 Other pulmonary embolism without acute cor pulmonale: Secondary | ICD-10-CM | POA: Diagnosis not present

## 2017-06-26 DIAGNOSIS — E1122 Type 2 diabetes mellitus with diabetic chronic kidney disease: Secondary | ICD-10-CM | POA: Diagnosis not present

## 2017-06-26 NOTE — Telephone Encounter (Signed)
Caller name: Shaunda  Relation to pt: Physical therapist from [place] Call back number: [phone] Pharmacy:  Reason for call: Needing verbal approval from provider to have physical therapy twice a wk for 1 wk. Please advise.

## 2017-06-27 ENCOUNTER — Encounter: Payer: Self-pay | Admitting: Family Medicine

## 2017-06-27 ENCOUNTER — Ambulatory Visit (INDEPENDENT_AMBULATORY_CARE_PROVIDER_SITE_OTHER): Payer: Medicare Other | Admitting: Family Medicine

## 2017-06-27 VITALS — BP 130/58 | HR 90 | Temp 98.7°F | Ht 71.0 in | Wt 221.2 lb

## 2017-06-27 DIAGNOSIS — D638 Anemia in other chronic diseases classified elsewhere: Secondary | ICD-10-CM

## 2017-06-27 DIAGNOSIS — R110 Nausea: Secondary | ICD-10-CM

## 2017-06-27 DIAGNOSIS — I82401 Acute embolism and thrombosis of unspecified deep veins of right lower extremity: Secondary | ICD-10-CM | POA: Diagnosis not present

## 2017-06-27 DIAGNOSIS — N183 Chronic kidney disease, stage 3 unspecified: Secondary | ICD-10-CM

## 2017-06-27 DIAGNOSIS — I2699 Other pulmonary embolism without acute cor pulmonale: Secondary | ICD-10-CM | POA: Diagnosis not present

## 2017-06-27 DIAGNOSIS — K219 Gastro-esophageal reflux disease without esophagitis: Secondary | ICD-10-CM | POA: Diagnosis not present

## 2017-06-27 LAB — CBC WITH DIFFERENTIAL/PLATELET
BASOS ABS: 0 10*3/uL (ref 0.0–0.1)
Basophils Relative: 0.8 % (ref 0.0–3.0)
EOS ABS: 0.1 10*3/uL (ref 0.0–0.7)
Eosinophils Relative: 2.5 % (ref 0.0–5.0)
HCT: 37.7 % — ABNORMAL LOW (ref 39.0–52.0)
Hemoglobin: 12.1 g/dL — ABNORMAL LOW (ref 13.0–17.0)
LYMPHS ABS: 1.3 10*3/uL (ref 0.7–4.0)
Lymphocytes Relative: 22.7 % (ref 12.0–46.0)
MCHC: 32.1 g/dL (ref 30.0–36.0)
MCV: 93.3 fl (ref 78.0–100.0)
MONO ABS: 0.2 10*3/uL (ref 0.1–1.0)
MONOS PCT: 4.1 % (ref 3.0–12.0)
NEUTROS ABS: 3.9 10*3/uL (ref 1.4–7.7)
NEUTROS PCT: 69.9 % (ref 43.0–77.0)
PLATELETS: 388 10*3/uL (ref 150.0–400.0)
RBC: 4.04 Mil/uL — AB (ref 4.22–5.81)
RDW: 16.1 % — ABNORMAL HIGH (ref 11.5–15.5)
WBC: 5.6 10*3/uL (ref 4.0–10.5)

## 2017-06-27 LAB — COMPREHENSIVE METABOLIC PANEL
ALK PHOS: 52 U/L (ref 39–117)
ALT: 20 U/L (ref 0–53)
AST: 25 U/L (ref 0–37)
Albumin: 4.3 g/dL (ref 3.5–5.2)
BILIRUBIN TOTAL: 0.5 mg/dL (ref 0.2–1.2)
BUN: 21 mg/dL (ref 6–23)
CO2: 27 mEq/L (ref 19–32)
CREATININE: 1.7 mg/dL — AB (ref 0.40–1.50)
Calcium: 9.2 mg/dL (ref 8.4–10.5)
Chloride: 105 mEq/L (ref 96–112)
GFR: 41.46 mL/min — ABNORMAL LOW (ref >60.00)
GLUCOSE: 142 mg/dL — AB (ref 70–99)
Potassium: 4.1 mEq/L (ref 3.5–5.1)
SODIUM: 139 meq/L (ref 135–145)
TOTAL PROTEIN: 7 g/dL (ref 6.0–8.3)

## 2017-06-27 MED ORDER — ONDANSETRON HCL 4 MG PO TABS: 4.0000 mg | ORAL_TABLET | Freq: Three times a day (TID) | ORAL | 0 refills | Status: DC | PRN

## 2017-06-27 NOTE — Progress Notes (Signed)
Patient ID: Louis [name]., male    DOB: [birth year]  Age: 81 y.o. MRN: [id number]    Subjective:  Subjective  HPI Louis [name]. presents for hosp f/u for b/L PE . He was in the hosp 9/17-9/21.  D dimer and ct --+PE  He was also dx with dvt R LOw ext.  Pt is not on eliquis.  hosp records reviewed  Review of Systems  Constitutional: Negative for appetite change, diaphoresis, fatigue and unexpected weight change.  Eyes: Negative for pain, redness and visual disturbance.  Respiratory: Positive for shortness of breath. Negative for cough, chest tightness and wheezing.   Cardiovascular: Negative for chest pain, palpitations and leg swelling.  Endocrine: Negative for cold intolerance, heat intolerance, polydipsia, polyphagia and polyuria.  Genitourinary: Negative for difficulty urinating, dysuria and frequency.  Neurological: Negative for dizziness, light-headedness, numbness and headaches.    History Past Medical History:  Diagnosis Date  . Anemia   . Arthritis    Dr Amil Amen, [facility] rx- Rheumatology  . Diabetes mellitus   . Diverticulosis   . GERD (gastroesophageal reflux disease)   . Hemochromatosis     Dr Henrene Pastor  . History of kidney stones 1980   passed spont.  Marland Kitchen Hx of adenomatous colonic polyps   . Hyperlipidemia   . Hypertension   . Hypothyroidism   . IBS (irritable bowel syndrome)   . Internal hemorrhoids   . Kidney stones, calcium oxalate    1985 & 2005  . Pulmonary embolism ([place]) 2015   hosp. with Heparin & then on Xarelto- 3 months or so  . Reflux esophagitis   . SBO (small bowel obstruction) ([place]) 2004  . Spinal stenosis    Dr.Ramos  . Testosterone deficiency    111 in 2007    He has a past surgical history that includes Appendectomy; Shoulder surgery; Colonoscopy w/ polypectomy (2005); lumbar nerve block; Total shoulder replacement; Total knee arthroplasty ( 201 & 2012); Refractive surgery (2005); Tonsillectomy and adenoidectomy; cataract (2005); TM  replacement (1981); Middle ear surgery (1980); L4-5 & L5- S1 bilat facet injections (01/20/14); Joint replacement; and Lumbar laminectomy with coflex 2 level (N/A, 07/30/2016).   His family history includes Breast cancer in his maternal grandmother; Depression in his brother; Diabetes in his father and maternal aunt; Heart disease in his paternal aunt and paternal uncle; Heart failure in his mother; Stroke (age of onset: 59) in his father.He reports that he quit smoking about 36 years ago. He has never used smokeless tobacco. He reports that he drinks alcohol. He reports that he does not use drugs.  Current Outpatient Prescriptions on File Prior to Visit  Medication Sig Dispense Refill  . ALPRAZolam (XANAX) 0.5 MG tablet TAKE ONE-HALF BY MOUTH EVERY 8 TO 12 HOURS AS NEEDED (Patient taking differently: Take 0.25 mg by mouth every 8 (eight) hours as needed for sleep. ) 30 tablet 3  . apixaban (ELIQUIS) 5 MG TABS tablet Take 1 tablet (5 mg total) by mouth 2 (two) times daily. 60 tablet 0  . aspirin EC 81 MG tablet Take 81 mg by mouth at bedtime.    Marland Kitchen atorvastatin (LIPITOR) 20 MG tablet TAKE 1 TABLET DAILY AT 6 P.M. (Patient taking differently: TAKE 20mg  DAILY AT 6 P.M.) 90 tablet 3  . CALCIUM-VITAMIN D PO Take 1 tablet by mouth daily.     . fenofibrate 160 MG tablet TAKE 1 TABLET(160 MG) BY MOUTH DAILY 30 tablet 6  . fluticasone (FLONASE) 50 MCG/ACT nasal spray  Place 2 sprays into both nostrils daily. 16 g 1  . folic acid (FOLVITE) 1 MG tablet Take 1 mg by mouth daily.      Marland Kitchen HYDROcodone-acetaminophen (NORCO) 10-325 MG per tablet Take 1 tablet by mouth every 4 (four) hours as needed for moderate pain or severe pain (pain).     Marland Kitchen levocetirizine (XYZAL) 5 MG tablet Take 1 tablet (5 mg total) by mouth every evening. 30 tablet 5  . levothyroxine (SYNTHROID, LEVOTHROID) 125 MCG tablet TAKE 1 TABLET(125 MCG) BY MOUTH DAILY (Patient taking differently: Take 125 mcg by mouth daily before breakfast. ) 90 tablet 0   . methocarbamol (ROBAXIN) 500 MG tablet Take 1 tablet (500 mg total) by mouth every 6 (six) hours as needed for muscle spasms. 40 tablet 3  . methotrexate (RHEUMATREX) 2.5 MG tablet Take 20 mg by mouth every Monday.     . multivitamin (THERAGRAN) per tablet Take 1 tablet by mouth daily.      . pantoprazole (PROTONIX) 40 MG tablet Take 1 tablet (40 mg total) by mouth daily. 30 tablet 0  . pioglitazone (ACTOS) 15 MG tablet TAKE 1 TABLET DAILY (Patient taking differently: TAKE 15mg  by mouth once DAILY) 90 tablet 3  . vitamin B-12 (CYANOCOBALAMIN) 1000 MCG tablet Take 1,000 mcg by mouth daily.     No current facility-administered medications on file prior to visit.      Objective:  Objective  Physical Exam  Constitutional: He is oriented to person, place, and time. Vital signs are normal. He appears well-developed and well-nourished. He is sleeping.  HENT:  Head: Normocephalic and atraumatic.  Mouth/Throat: Oropharynx is clear and moist.  Eyes: Pupils are equal, round, and reactive to light. EOM are normal.  Neck: Normal range of motion. Neck supple. No thyromegaly present.  Cardiovascular: Normal rate and regular rhythm.   No murmur heard. Pulmonary/Chest: Effort normal and breath sounds normal. No respiratory distress. He has no wheezes. He has no rales. He exhibits no tenderness.  Musculoskeletal: He exhibits no edema or tenderness.  Neurological: He is alert and oriented to person, place, and time.  Skin: Skin is warm and dry.  Psychiatric: He has a normal mood and affect. His behavior is normal. Judgment and thought content normal.  Nursing note and vitals reviewed.  BP (!) 130/58 (BP Location: Right Arm, Patient Position: Sitting, Cuff Size: Normal)   Pulse 90   Temp 98.7 F (37.1 C) (Oral)   Ht 5\' 11"  (1.803 m)   Wt 221 lb 3.2 oz (100.3 kg)   SpO2 97%   BMI 30.85 kg/m  Wt Readings from Last 3 Encounters:  06/27/17 221 lb 3.2 oz (100.3 kg)  06/19/17 219 lb 9.3 oz (99.6 kg)    06/18/17 221 lb 6.4 oz (100.4 kg)     Lab Results  Component Value Date   WBC 5.6 06/27/2017   HGB 12.1 (L) 06/27/2017   HCT 37.7 (L) 06/27/2017   PLT 388.0 06/27/2017   GLUCOSE 142 (H) 06/27/2017   CHOL 175 05/21/2017   TRIG 193.0 (H) 05/21/2017   HDL 42.50 05/21/2017   LDLDIRECT 120.0 12/02/2015   LDLCALC 93 05/21/2017   ALT 20 06/27/2017   AST 25 06/27/2017   NA 139 06/27/2017   K 4.1 06/27/2017   CL 105 06/27/2017   CREATININE 1.70 (H) 06/27/2017   BUN 21 06/27/2017   CO2 27 06/27/2017   TSH 0.37 05/21/2017   PSA 1.67 10/11/2016   INR 1.02 06/18/2017   HGBA1C  7.0 (H) 05/21/2017   MICROALBUR 3.0 (H) 12/08/2014    Dg Chest 2 View  Result Date: 06/18/2017 CLINICAL DATA:  Shortness of breath for 3 weeks with some left-sided chest pain. EXAM: CHEST  2 VIEW COMPARISON:  CT chest 08/17/2014.  PA and lateral chest 03/05/2013. FINDINGS: The lungs are emphysematous but clear. Heart size is normal. Aortic atherosclerosis is noted. Postoperative change both shoulders is seen. No acute bony abnormality. IMPRESSION: Emphysema without acute disease. Atherosclerosis. Electronically Signed   By: Inge Rise M.D.   On: 06/18/2017 12:44   Ct Angio Chest W/cm &/or Wo Cm  Result Date: 06/18/2017 CLINICAL DATA:  Shortness of breath and occasional chest pain for 3 weeks. History of pulmonary embolism in the past. EXAM: CT ANGIOGRAPHY CHEST WITH CONTRAST TECHNIQUE: Multidetector CT imaging of the chest was performed using the standard protocol during bolus administration of intravenous contrast. Multiplanar CT image reconstructions and MIPs were obtained to evaluate the vascular anatomy. CONTRAST:  80 cc Isovue 370 COMPARISON:  Chest CT angiogram dated 08/17/2014. FINDINGS: Cardiovascular: Moderate load of pulmonary embolism noted at the distal aspect of the right main pulmonary artery extending into the right interlobar pulmonary artery (nearly occlusive) and into multiple central segmental  pulmonary artery branches to the right upper lobe, right middle lobe and right lower lobe. Smaller amount of pulmonary embolism is seen within the distal portion of the left main pulmonary artery extending into the left interlobar pulmonary artery and into multiple segmental pulmonary artery branches to the left upper lobe, lingula and left lower lobe. Positive for acute PE with CTevidence of right heart strain (RV/LV Ratio = 1.23) consistent with at least submassive (intermediate risk) PE. The presence of right heart strain has been associated with an increased risk of morbidity and mortality. Heart size is normal. No pericardial effusion. Aortic atherosclerosis. No aortic aneurysm or evidence of aortic dissection. Mediastinum/Nodes: No mass or enlarged lymph nodes within the mediastinum or perihilar regions. Esophagus is unremarkable. Trachea and central bronchi are unremarkable. Lungs/Pleura: Mild atelectasis at each lung base. No pleural effusion. Emphysematous changes at the lung apices, mild to moderate in degree. Upper Abdomen: No acute abnormality. Musculoskeletal: Mild degenerative spurring within the mid and lower thoracic spine. Mild chronic-appearing compression deformity of the T12 vertebral body. No acute appearing osseous abnormality. Superficial soft tissues are unremarkable. Review of the MIP images confirms the above findings. IMPRESSION: 1. Bilateral acute PE, as detailed above, with CT evidence of right heart strain (RV/LV Ratio = 1.23) consistent with at least submassive (intermediate risk) PE. The presence of right heart strain has been associated with an increased risk of morbidity and mortality. 2. Mild atelectasis at each lung base. Lungs otherwise clear. Biapical emphysematous change. 3. Heart size is normal.  No pericardial effusion. 4. No aortic aneurysm or dissection. Aortic Atherosclerosis (ICD10-I70.0) and Emphysema (ICD10-J43.9). Critical Value/emergent results were called by  telephone at the time of interpretation on 06/18/2017 at 5:15 pm to Dr. Roma Schanz , who verbally acknowledged these results. Electronically Signed   By: Franki Cabot M.D.   On: 06/18/2017 17:16     Assessment & Plan:  Plan  I am having Mr. Younglove start on ondansetron. I am also having him maintain his CALCIUM-VITAMIN D PO, folic acid, HYDROcodone-acetaminophen, methotrexate, multivitamin, vitamin B-12, aspirin EC, methocarbamol, atorvastatin, fenofibrate, fluticasone, ALPRAZolam, pioglitazone, levothyroxine, levocetirizine, apixaban, and pantoprazole.  Meds ordered this encounter  Medications  . ondansetron (ZOFRAN) 4 MG tablet    Sig: Take 1  tablet (4 mg total) by mouth every 8 (eight) hours as needed for nausea or vomiting.    Dispense:  20 tablet    Refill:  0    Problem List Items Addressed This Visit      Unprioritized   Anemia, chronic disease    Recheck labs Refer to nephrology      Relevant Orders   CBC with Differential/Platelet (Completed)   Comprehensive metabolic panel (Completed)   Chronic renal impairment, stage 3 (moderate) ([place])   Relevant Orders   Ambulatory referral to Nephrology   CBC with Differential/Platelet (Completed)   Comprehensive metabolic panel (Completed)   Deep vein thrombosis (DVT) of right lower extremity ([place])    On eliquis Refer to hematology      Gastroesophageal reflux disease   Relevant Medications   ondansetron (ZOFRAN) 4 MG tablet   Other Relevant Orders   Ambulatory referral to Gastroenterology   Nausea   Relevant Medications   ondansetron (ZOFRAN) 4 MG tablet   Other Relevant Orders   Ambulatory referral to Gastroenterology   Recurrent pulmonary embolism ([place]) - Primary    con't eliquis Refer hematology      Relevant Orders   Ambulatory referral to Hematology      Follow-up: Return in about 3 months (around 09/26/2017), or if symptoms worsen or fail to improve.  Ann Held, DO

## 2017-06-27 NOTE — Assessment & Plan Note (Signed)
Recheck labs Refer to nephrology

## 2017-06-27 NOTE — Assessment & Plan Note (Signed)
con't eliquis Refer hematology

## 2017-06-27 NOTE — Assessment & Plan Note (Signed)
On eliquis Refer to hematology  

## 2017-06-27 NOTE — Patient Instructions (Signed)
Pulmonary Embolism °A pulmonary embolism (PE) is a sudden blockage or decrease of blood flow in one lung or both lungs. Most blockages come from a blood clot that travels from the legs or the pelvis to the lungs. PE is a dangerous and potentially life-threatening condition if it is not treated right away. °What are the causes? °A pulmonary embolism occurs most commonly when a blood clot travels from one of your veins to your lungs. Rarely, PE is caused by air, fat, amniotic fluid, or part of a tumor traveling through your veins to your lungs. °What increases the risk? °A PE is more likely to develop in: °· People who smoke. °· People who are older, especially over 60 years of age. °· People who are overweight (obese). °· People who sit or lie still for a long time, such as during long-distance travel (over 4 hours), bed rest, hospitalization, or during recovery from certain medical conditions like a stroke. °· People who do not engage in much physical activity (sedentary lifestyle). °· People who have chronic breathing disorders. °· People who have a personal or family history of blood clots or blood clotting disease. °· People who have peripheral vascular disease (PVD), diabetes, or some types of cancer. °· People who have heart disease, especially if the person had a recent heart attack or has congestive heart failure. °· People who have neurological diseases that affect the legs (leg paresis). °· People who have had a traumatic injury, such as breaking a hip or leg. °· People who have recently had major or lengthy surgery, especially on the hip, knee, or abdomen. °· People who have had a central line placed inside a large vein. °· People who take medicines that contain the hormone estrogen. These include birth control pills and hormone replacement therapy. °· Pregnancy or during childbirth or the postpartum period. ° °What are the signs or symptoms? °The symptoms of a PE usually start suddenly and  include: °· Shortness of breath while active or at rest. °· Coughing or coughing up blood or blood-tinged mucus. °· Chest pain that is often worse with deep breaths. °· Rapid or irregular heartbeat. °· Feeling light-headed or dizzy. °· Fainting. °· Feeling anxious. °· Sweating. ° °There may also be pain and swelling in a leg if that is where the blood clot started. °These symptoms may represent a serious problem that is an emergency. Do not wait to see if the symptoms will go away. Get medical help right away. Call your local emergency services (911 in the [place] Do not drive yourself to the hospital. °How is this diagnosed? °Your health care provider will take a medical history and perform a physical exam. You may also have other tests, including: °· Blood tests to assess the clotting properties of your blood, assess oxygen levels in your blood, and find blood clots. °· Imaging tests, such as CT, ultrasound, MRI, X-ray, and other tests to see if you have clots anywhere in your body. °· An electrocardiogram (ECG) to look for heart strain from blood clots in the lungs. ° °How is this treated? °The main goals of PE treatment are: °· To stop a blood clot from growing larger. °· To stop new blood clots from forming. ° °The type of treatment that you receive depends on many factors, such as the cause of your PE, your risk for bleeding or developing more clots, and other medical conditions that you have. Sometimes, a combination of treatments is necessary. °This condition may be treated with: °· Medicines, including newer oral blood thinners (  anticoagulants), warfarin, low molecular weight heparins, thrombolytics, or heparins. °· Wearing compression stockings or using different types of devices. °· Surgery (rare) to remove the blood clot or to place a filter in your abdomen to stop the blood clot from traveling to your lungs. ° °Treatments for a PE are often divided into immediate treatment, long-term treatment (up to 3  months after PE), and extended treatment (more than 3 months after PE). Your treatment may continue for several months. This is called maintenance therapy, and it is used to prevent the forming of new blood clots. You can work with your health care provider to choose the treatment program that is best for you. °What are anticoagulants? °Anticoagulants are medicines that treat PEs. They can stop current blood clots from growing and stop new clots from forming. They cannot dissolve existing clots. Your body dissolves clots by itself over time. Anticoagulants are given by mouth, by injection, or through an IV tube. °What are thrombolytics? °Thrombolytics are clot-dissolving medicines that are used to dissolve a PE. They carry a high risk of bleeding, so they tend to be used only in severe cases or if you have very low blood pressure. °Follow these instructions at home: °If you are taking a newer oral anticoagulant: °· Take the medicine every single day at the same time each day. °· Understand what foods and drugs interact with this medicine. °· Understand that there are no regular blood tests required when using this medicine. °· Understand the side effects of this medicine, including excessive bruising or bleeding. Ask your health care provider or pharmacist about other possible side effects. °If you are taking warfarin: °· Understand how to take warfarin and know which foods can affect how warfarin works in your body. °· Understand that it is dangerous to take too much or too little warfarin. Too much warfarin increases the risk of bleeding. Too little warfarin continues to allow the risk for blood clots. °· Follow your PT and INR blood testing schedule. The PT and INR results allow your health care provider to adjust your dose of warfarin. It is very important that you have your PT and INR tested as often as told by your health care provider. °· Avoid major changes in your diet, or tell your health care provider  before you change your diet. Arrange a visit with a registered dietitian to answer your questions. Many foods, especially foods that are high in vitamin K, can interfere with warfarin and affect the PT and INR results. Eat a consistent amount of foods that are high in vitamin K, such as: °? Spinach, kale, broccoli, cabbage, collard greens, turnip greens, Brussels sprouts, peas, cauliflower, seaweed, and parsley. °? Beef liver and pork liver. °? Green tea. °? Soybean oil. °· Tell your health care provider about any and all medicines, vitamins, and supplements that you take, including aspirin and other over-the-counter anti-inflammatory medicines. Be especially cautious with aspirin and anti-inflammatory medicines. Do not take those before you ask your health care provider if it is safe to do so. This is important because many medicines can interfere with warfarin and affect the PT and INR results. °· Do not start or stop taking any over-the-counter or prescription medicine unless your health care provider or pharmacist tells you to do so. °If you take warfarin, you will also need to do these things: °· Hold pressure over cuts for longer than usual. °· Tell your dentist and other health care providers that you are taking warfarin before you have   any procedures in which bleeding may occur. °· Avoid alcohol or drink very small amounts. Tell your health care provider if you change your alcohol intake. °· Do not use tobacco products, including cigarettes, chewing tobacco, and e-cigarettes. If you need help quitting, ask your health care provider. °· Avoid contact sports. ° °General instructions °· Take over-the-counter and prescription medicines only as told by your health care provider. Anticoagulant medicines can have side effects, including easy bruising and difficulty stopping bleeding. If you are prescribed an anticoagulant, you will also need to do these things: °? Hold pressure over cuts for longer than  usual. °? Tell your dentist and other health care providers that you are taking anticoagulants before you have any procedures in which bleeding may occur. °? Avoid contact sports. °· Wear a medical alert bracelet or carry a medical alert card that says you have had a PE. °· Ask your health care provider how soon you can go back to your normal activities. Stay active to prevent new blood clots from forming. °· Make sure to exercise while traveling or when you have been sitting or standing for a long period of time. It is very important to exercise. Exercise your legs by walking or by tightening and relaxing your leg muscles often. Take frequent walks. °· Wear compression stockings as told by your health care provider to help prevent more blood clots from forming. °· Do not use tobacco products, including cigarettes, chewing tobacco, and e-cigarettes. If you need help quitting, ask your health care provider. °· Keep all follow-up appointments with your health care provider. This is important. °How is this prevented? °Take these actions to decrease your risk of developing another PE: °· Exercise regularly. For at least 30 minutes every day, engage in: °? Activity that involves moving your arms and legs. °? Activity that encourages good blood flow through your body by increasing your heart rate. °· Exercise your arms and legs every hour during long-distance travel (over 4 hours). Drink plenty of water and avoid drinking alcohol while traveling. °· Avoid sitting or lying in bed for long periods of time without moving your legs. °· Maintain a weight that is appropriate for your height. Ask your health care provider what weight is healthy for you. °· If you are a woman who is over 35 years of age, avoid unnecessary use of medicines that contain estrogen. These include birth control pills. °· Do not smoke, especially if you take estrogen medicines. If you need help quitting, ask your health care provider. °· If you are at  very high risk for PE, wear compression stockings. °· If you recently had a PE, have regularly scheduled ultrasound testing on your legs to check for new blood clots. ° °If you are hospitalized, prevention measures may include: °· Early walking after surgery, as soon as your health care provider says that it is safe. °· Receiving anticoagulants to prevent blood clots. If you cannot take anticoagulants, other options may be available, such as wearing compression stockings or using different types of devices. ° °Get help right away if: °· You have new or increased pain, swelling, or redness in an arm or leg. °· You have numbness or tingling in an arm or leg. °· You have shortness of breath while active or at rest. °· You have chest pain. °· You have a rapid or irregular heartbeat. °· You feel light-headed or dizzy. °· You cough up blood. °· You notice blood in your vomit, bowel movement, or   urine. °· You have a fever. °These symptoms may represent a serious problem that is an emergency. Do not wait to see if the symptoms will go away. Get medical help right away. Call your local emergency services (911 in the [place] Do not drive yourself to the hospital. °This information is not intended to replace advice given to you by your health care provider. Make sure you discuss any questions you have with your health care provider. °Document Released: 09/14/2000 Document Revised: 02/23/2016 Document Reviewed: 01/12/2015 °Elsevier Interactive Patient Education © 2017 [employer] ° °

## 2017-06-28 DIAGNOSIS — N183 Chronic kidney disease, stage 3 (moderate): Secondary | ICD-10-CM | POA: Diagnosis not present

## 2017-06-28 DIAGNOSIS — E1122 Type 2 diabetes mellitus with diabetic chronic kidney disease: Secondary | ICD-10-CM | POA: Diagnosis not present

## 2017-06-28 DIAGNOSIS — K5791 Diverticulosis of intestine, part unspecified, without perforation or abscess with bleeding: Secondary | ICD-10-CM | POA: Diagnosis not present

## 2017-06-28 DIAGNOSIS — I129 Hypertensive chronic kidney disease with stage 1 through stage 4 chronic kidney disease, or unspecified chronic kidney disease: Secondary | ICD-10-CM | POA: Diagnosis not present

## 2017-06-28 DIAGNOSIS — K589 Irritable bowel syndrome without diarrhea: Secondary | ICD-10-CM | POA: Diagnosis not present

## 2017-06-28 DIAGNOSIS — I2699 Other pulmonary embolism without acute cor pulmonale: Secondary | ICD-10-CM | POA: Diagnosis not present

## 2017-06-28 NOTE — Telephone Encounter (Signed)
Called [facility] informed of PCP ok 

## 2017-07-01 ENCOUNTER — Other Ambulatory Visit: Payer: Self-pay | Admitting: Family Medicine

## 2017-07-01 MED ORDER — BUDESONIDE-FORMOTEROL FUMARATE 80-4.5 MCG/ACT IN AERO: 2.0000 | INHALATION_SPRAY | Freq: Two times a day (BID) | RESPIRATORY_TRACT | 0 refills | Status: DC

## 2017-07-03 DIAGNOSIS — E1122 Type 2 diabetes mellitus with diabetic chronic kidney disease: Secondary | ICD-10-CM | POA: Diagnosis not present

## 2017-07-03 DIAGNOSIS — I2699 Other pulmonary embolism without acute cor pulmonale: Secondary | ICD-10-CM | POA: Diagnosis not present

## 2017-07-03 DIAGNOSIS — I129 Hypertensive chronic kidney disease with stage 1 through stage 4 chronic kidney disease, or unspecified chronic kidney disease: Secondary | ICD-10-CM | POA: Diagnosis not present

## 2017-07-03 DIAGNOSIS — K589 Irritable bowel syndrome without diarrhea: Secondary | ICD-10-CM | POA: Diagnosis not present

## 2017-07-03 DIAGNOSIS — K5791 Diverticulosis of intestine, part unspecified, without perforation or abscess with bleeding: Secondary | ICD-10-CM | POA: Diagnosis not present

## 2017-07-03 DIAGNOSIS — N183 Chronic kidney disease, stage 3 (moderate): Secondary | ICD-10-CM | POA: Diagnosis not present

## 2017-07-04 DIAGNOSIS — M810 Age-related osteoporosis without current pathological fracture: Secondary | ICD-10-CM | POA: Diagnosis not present

## 2017-07-04 DIAGNOSIS — M150 Primary generalized (osteo)arthritis: Secondary | ICD-10-CM | POA: Diagnosis not present

## 2017-07-04 DIAGNOSIS — Z6829 Body mass index (BMI) 29.0-29.9, adult: Secondary | ICD-10-CM | POA: Diagnosis not present

## 2017-07-04 DIAGNOSIS — M5136 Other intervertebral disc degeneration, lumbar region: Secondary | ICD-10-CM | POA: Diagnosis not present

## 2017-07-04 DIAGNOSIS — M0609 Rheumatoid arthritis without rheumatoid factor, multiple sites: Secondary | ICD-10-CM | POA: Diagnosis not present

## 2017-07-04 DIAGNOSIS — E663 Overweight: Secondary | ICD-10-CM | POA: Diagnosis not present

## 2017-07-05 ENCOUNTER — Telehealth: Payer: Self-pay | Admitting: Family Medicine

## 2017-07-05 MED ORDER — APIXABAN 5 MG PO TABS: 5.0000 mg | ORAL_TABLET | Freq: Two times a day (BID) | ORAL | 0 refills | Status: DC

## 2017-07-05 NOTE — Telephone Encounter (Signed)
Faxed 90d to [facility]/thx dmf

## 2017-07-05 NOTE — Telephone Encounter (Signed)
Pt spouse called states pt began ELIQUIS 5 mg twice a day when he discharged from [facility] 06/26/17. Please call in to [facility] 90 day supply.

## 2017-07-09 ENCOUNTER — Telehealth: Payer: Self-pay | Admitting: *Deleted

## 2017-07-09 NOTE — Telephone Encounter (Signed)
Received Home Health Certification and Plan of Care; forwarded to provider/SLS 10/09

## 2017-07-10 DIAGNOSIS — M4807 Spinal stenosis, lumbosacral region: Secondary | ICD-10-CM | POA: Diagnosis not present

## 2017-07-10 DIAGNOSIS — G894 Chronic pain syndrome: Secondary | ICD-10-CM | POA: Diagnosis not present

## 2017-07-12 DIAGNOSIS — I129 Hypertensive chronic kidney disease with stage 1 through stage 4 chronic kidney disease, or unspecified chronic kidney disease: Secondary | ICD-10-CM | POA: Diagnosis not present

## 2017-07-12 DIAGNOSIS — E1122 Type 2 diabetes mellitus with diabetic chronic kidney disease: Secondary | ICD-10-CM | POA: Diagnosis not present

## 2017-07-12 DIAGNOSIS — K5791 Diverticulosis of intestine, part unspecified, without perforation or abscess with bleeding: Secondary | ICD-10-CM | POA: Diagnosis not present

## 2017-07-12 DIAGNOSIS — N183 Chronic kidney disease, stage 3 (moderate): Secondary | ICD-10-CM | POA: Diagnosis not present

## 2017-07-12 DIAGNOSIS — K589 Irritable bowel syndrome without diarrhea: Secondary | ICD-10-CM | POA: Diagnosis not present

## 2017-07-12 DIAGNOSIS — I2699 Other pulmonary embolism without acute cor pulmonale: Secondary | ICD-10-CM | POA: Diagnosis not present

## 2017-07-15 ENCOUNTER — Other Ambulatory Visit (HOSPITAL_BASED_OUTPATIENT_CLINIC_OR_DEPARTMENT_OTHER): Payer: Medicare Other

## 2017-07-15 ENCOUNTER — Ambulatory Visit (HOSPITAL_BASED_OUTPATIENT_CLINIC_OR_DEPARTMENT_OTHER): Payer: Medicare Other | Admitting: Hematology & Oncology

## 2017-07-15 VITALS — BP 145/62 | HR 65 | Temp 97.9°F | Wt 220.4 lb

## 2017-07-15 DIAGNOSIS — I82401 Acute embolism and thrombosis of unspecified deep veins of right lower extremity: Secondary | ICD-10-CM | POA: Diagnosis not present

## 2017-07-15 DIAGNOSIS — I2699 Other pulmonary embolism without acute cor pulmonale: Secondary | ICD-10-CM

## 2017-07-15 DIAGNOSIS — I2609 Other pulmonary embolism with acute cor pulmonale: Secondary | ICD-10-CM

## 2017-07-15 DIAGNOSIS — E119 Type 2 diabetes mellitus without complications: Secondary | ICD-10-CM

## 2017-07-15 LAB — CBC WITH DIFFERENTIAL (CANCER CENTER ONLY)
BASO#: 0 10*3/uL (ref 0.0–0.2)
BASO%: 1.1 % (ref 0.0–2.0)
EOS%: 4 % (ref 0.0–7.0)
Eosinophils Absolute: 0.2 10*3/uL (ref 0.0–0.5)
HEMATOCRIT: 36.2 % — AB (ref 38.7–49.9)
HEMOGLOBIN: 11.5 g/dL — AB (ref 13.0–17.1)
LYMPH#: 1 10*3/uL (ref 0.9–3.3)
LYMPH%: 27.7 % (ref 14.0–48.0)
MCH: 30.3 pg (ref 28.0–33.4)
MCHC: 31.8 g/dL — AB (ref 32.0–35.9)
MCV: 95 fL (ref 82–98)
MONO#: 0.4 10*3/uL (ref 0.1–0.9)
MONO%: 9.3 % (ref 0.0–13.0)
NEUT#: 2.2 10*3/uL (ref 1.5–6.5)
NEUT%: 57.9 % (ref 40.0–80.0)
Platelets: 314 10*3/uL (ref 145–400)
RBC: 3.8 10*6/uL — ABNORMAL LOW (ref 4.20–5.70)
RDW: 15.7 % (ref 11.1–15.7)
WBC: 3.8 10*3/uL — ABNORMAL LOW (ref 4.0–10.0)

## 2017-07-15 LAB — CMP (CANCER CENTER ONLY)
ALBUMIN: 3.6 g/dL (ref 3.3–5.5)
ALK PHOS: 60 U/L (ref 26–84)
ALT: 21 U/L (ref 10–47)
AST: 26 U/L (ref 11–38)
BILIRUBIN TOTAL: 0.5 mg/dL (ref 0.20–1.60)
BUN, Bld: 18 mg/dL (ref 7–22)
CALCIUM: 9.3 mg/dL (ref 8.0–10.3)
CO2: 28 mEq/L (ref 18–33)
CREATININE: 1.9 mg/dL — AB (ref 0.6–1.2)
Chloride: 105 mEq/L (ref 98–108)
GLUCOSE: 119 mg/dL — AB (ref 73–118)
POTASSIUM: 4.3 meq/L (ref 3.3–4.7)
Sodium: 145 mEq/L (ref 128–145)
Total Protein: 6.7 g/dL (ref 6.4–8.1)

## 2017-07-15 NOTE — Progress Notes (Signed)
Referral MD  Reason for Referral: Recurrent bilateral pulmonary emboli and right lower extremity thrombus                                    Hemochromatosis   Chief Complaint  Patient presents with  . New patient  : I had another blood clot in my lungs.  HPI: Mr. Louis [name] is an incredibly interesting 81 year old white male. To my surprise into my enjoyment, he played football had [employer] back in the 50s. He was incredibly impressed that I had a [employer] exam room. Unfortunately, he was not in that room today.  He is retired. He retired in his mid 21s from [employer]. 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  He has had multiple surgeries. His last surgery was a back surgery in the lumbar spine back about a year or so ago.  He also has a diagnosis of hemochromatosis. From what is to has, he's never had any genetic studies done. He says this was made by a liver biopsy. He says that he has had phlebotomies. He says his blood been about 2 years since he was phlebotomized.  The last iron studies that I have on him were back in August when his ferritin was only 24. He says his brother has hemachromatosis.  His history of blood clots goes back to November of 2015 when he had pulmonary emboli. He has some shortness of breath. He had no chest pain. No hemoptysis. He had not been on any long travel. He apparently had been getting testosterone injections. He is not sure how long he was on Xarelto. He says he was on it for a month or so. I have a hard time believing this. Actually, it looks like he was on Xarelto for 3 months. He has managed by Dr. Unice Cobble. From a note in early March, he says that he had been off anticoagulant for 2-3 weeks.  Most recently, he had some more shortness of breath. This lasted for about 3 weeks. It kept getting worse. He following went to see his family doctor. She went ahead and ordered a d-dimer and some x-rays. X-rays were negative. The d-dimer was over  5. He was then sent to the hospital. He had a CT angiogram done on September 18. This showed bilateral pulmonary emboli. There was some right heart strain. There is some mild atelectasis at each lung base.  He was admitted. He was started on heparin.  He had a Doppler of his legs on September 19. He had an acute thrombus in the right popliteal and posterior tibial vein.  He is now placed on ELIQUIS.  He was referred to hematology for follow-up and management of his thromboembolic disease.  He feels much better. He does not have any chest pain. There is no cough and he has no hemoptysis. There is no bleeding.  He really never had any leg pain.  He and his wife are very nice.  Overall, I would say that his performance status is ECOG 1.    Past Medical History:  Diagnosis Date  . Anemia   . Arthritis    Dr Amil Amen, [facility] rx- Rheumatology  . Diabetes mellitus   . Diverticulosis   . GERD (gastroesophageal reflux disease)   . Hemochromatosis     Dr Henrene Pastor  . History of kidney stones 1980   passed spont.  Marland Kitchen  Hx of adenomatous colonic polyps   . Hyperlipidemia   . Hypertension   . Hypothyroidism   . IBS (irritable bowel syndrome)   . Internal hemorrhoids   . Kidney stones, calcium oxalate    1985 & 2005  . Pulmonary embolism ([place]) 2015   hosp. with Heparin & then on Xarelto- 3 months or so  . Reflux esophagitis   . SBO (small bowel obstruction) ([place]) 2004  . Spinal stenosis    Dr.Ramos  . Testosterone deficiency    111 in 2007  :  Past Surgical History:  Procedure Laterality Date  . APPENDECTOMY    . cataract  2005   bilaterally  . COLONOSCOPY W/ POLYPECTOMY  2005   tubular adenoma  . JOINT REPLACEMENT     L- shoulder, x2, replacement on both knees   . L4-5 & L5- S1 bilat facet injections  01/20/14    Dr Nelva Bush  . LUMBAR LAMINECTOMY WITH COFLEX 2 LEVEL N/A 07/30/2016   Procedure: Lumbar three- four Lumbar four - five Laminectomy and foraminotomy;  Surgeon: Kristeen Miss, MD;  Location: [facility];  Service: Neurosurgery;  Laterality: N/A;  L3-4 L4-5 Laminectomy with coflex  . lumbar nerve block      X 14; Dr Nelva Bush  . MIDDLE EAR SURGERY  1980   Teflon tack   . REFRACTIVE SURGERY  2005   SE Ophth  . SHOULDER SURGERY      2 on L ; 3 on R  . TM replacement  1981  . TONSILLECTOMY AND ADENOIDECTOMY    . TOTAL KNEE ARTHROPLASTY   201 & 2012    bilaterally;Dr Alusio  . TOTAL SHOULDER REPLACEMENT     L  :   Current Outpatient Prescriptions:  .  ALPRAZolam (XANAX) 0.5 MG tablet, TAKE ONE-HALF BY MOUTH EVERY 8 TO 12 HOURS AS NEEDED (Patient taking differently: Take 0.25 mg by mouth every 8 (eight) hours as needed for sleep. ), Disp: 30 tablet, Rfl: 3 .  apixaban (ELIQUIS) 5 MG TABS tablet, Take 1 tablet (5 mg total) by mouth 2 (two) times daily., Disp: 180 tablet, Rfl: 0 .  aspirin EC 81 MG tablet, Take 81 mg by mouth at bedtime., Disp: , Rfl:  .  atorvastatin (LIPITOR) 20 MG tablet, TAKE 1 TABLET DAILY AT 6 P.M. (Patient taking differently: TAKE 20mg  DAILY AT 6 P.M.), Disp: 90 tablet, Rfl: 3 .  budesonide-formoterol (SYMBICORT) 80-4.5 MCG/ACT inhaler, Inhale 2 puffs into the lungs 2 (two) times daily., Disp: 1 Inhaler, Rfl: 0 .  CALCIUM-VITAMIN D PO, Take 1 tablet by mouth daily. , Disp: , Rfl:  .  fenofibrate 160 MG tablet, TAKE 1 TABLET(160 MG) BY MOUTH DAILY, Disp: 30 tablet, Rfl: 6 .  fluticasone (FLONASE) 50 MCG/ACT nasal spray, Place 2 sprays into both nostrils daily., Disp: 16 g, Rfl: 1 .  folic acid (FOLVITE) 1 MG tablet, Take 1 mg by mouth daily.  , Disp: , Rfl:  .  HYDROcodone-acetaminophen (NORCO) 10-325 MG per tablet, Take 1 tablet by mouth every 4 (four) hours as needed for moderate pain or severe pain (pain). , Disp: , Rfl:  .  levocetirizine (XYZAL) 5 MG tablet, Take 1 tablet (5 mg total) by mouth every evening., Disp: 30 tablet, Rfl: 5 .  levothyroxine (SYNTHROID, LEVOTHROID) 125 MCG tablet, TAKE 1 TABLET(125 MCG) BY MOUTH DAILY (Patient taking  differently: Take 125 mcg by mouth daily before breakfast. ), Disp: 90 tablet, Rfl: 0 .  methocarbamol (ROBAXIN) 500 MG  tablet, Take 1 tablet (500 mg total) by mouth every 6 (six) hours as needed for muscle spasms., Disp: 40 tablet, Rfl: 3 .  methotrexate (RHEUMATREX) 2.5 MG tablet, Take 20 mg by mouth every Monday. , Disp: , Rfl:  .  multivitamin (THERAGRAN) per tablet, Take 1 tablet by mouth daily.  , Disp: , Rfl:  .  ondansetron (ZOFRAN) 4 MG tablet, Take 1 tablet (4 mg total) by mouth every 8 (eight) hours as needed for nausea or vomiting., Disp: 20 tablet, Rfl: 0 .  pantoprazole (PROTONIX) 40 MG tablet, Take 1 tablet (40 mg total) by mouth daily., Disp: 30 tablet, Rfl: 0 .  pioglitazone (ACTOS) 15 MG tablet, TAKE 1 TABLET DAILY (Patient taking differently: TAKE 15mg  by mouth once DAILY), Disp: 90 tablet, Rfl: 3 .  vitamin B-12 (CYANOCOBALAMIN) 1000 MCG tablet, Take 1,000 mcg by mouth daily., Disp: , Rfl:  .  amoxicillin (AMOXIL) 500 MG capsule, Take 500 mg by mouth as needed., Disp: , Rfl: 0:  :  Allergies  Allergen Reactions  . Shellfish Allergy Anaphylaxis    ANGIOEDEMA  . Garlic Nausea And Vomiting  . Iron Other (See Comments)    hemacomatosis  :  Family History  Problem Relation Age of Onset  . Diabetes Maternal Aunt   . Depression Brother        suicide  . Diabetes Father   . Stroke Father 80       MI in hospital for CVA  . Heart failure Mother        valvular disease  . Heart disease Paternal Aunt         2 aunts; 1 had MI @ ?> 68  . Heart disease Paternal Uncle           . Breast cancer Maternal Grandmother   . Colon cancer Neg Hx   . COPD Neg Hx   . Asthma Neg Hx   :  Social History   Social History  . Marital status: Married    Spouse name: N/A  . Number of children: N/A  . Years of education: N/A   Occupational History  . Not on file.   Social History Main Topics  . Smoking status: Former Smoker    Quit date: 10/01/1980  . Smokeless tobacco: Never  Used  . Alcohol use 0.0 oz/week     Comment: rare   . Drug use: No  . Sexual activity: Not Currently   Other Topics Concern  . Not on file   Social History Narrative   Daily caffeine   :  Pertinent items are noted in HPI.  Exam: Well-developed well-nourished white male in no obvious distress. Vital signs show temperature of 97.9. Pulse 65. Blood pressure 145/62. Weight is 220 pounds. Head and neck exam shows no ocular or oral lesions. There are no palpable cervical or supraclavicular lymph nodes. Lungs are clear bilaterally. No friction rubs are noted. He has good air movement bilaterally. Cardiac exam regular rate and rhythm with no murmurs, rubs or bruits. Abdomen is soft. He has good bowel sounds. There is no fluid wave. There is no palpable liver or spleen tip. Back exam shows the laminectomy scar in the lumbar spine. He has no tenderness over the spine, ribs or hips. Extremities shows no clubbing, cyanosis or edema. He has no obvious venous cord in his legs. He has a negative Homans sign in his legs. He has good strength in his legs. Neurological exam shows no focal neurological  deficits. Skin exam shows no rashes, ecchymoses or petechia.    Recent Labs  07/15/17 1333  WBC 3.8*  HGB 11.5*  HCT 36.2*  PLT 314    Recent Labs  07/15/17 1333  NA 145  K 4.3  CL 105  CO2 28  GLUCOSE 119*  BUN 18  CREATININE 1.9*  CALCIUM 9.3    Blood smear review:  None  Pathology: None     Assessment and Plan:  Mr. Louis [name] is a 81 year old white male. He has recurrent thromboembolic disease is lungs and in his right leg.  He was only on Xarelto 3 months with his first episode of pulmonary emboli. This should be adequate. However, it is possible that this may not have been long enough. However, I don't think this would've been a problem.  He had a normal hypercoagulable panel back 2 years ago. I'm going to send off a factor V Leiden and Prothrombin II gene mutation study. There is no  family history of blood clots.  I'm not sure he is still getting testosterone. This cervicodynia been a risk factor a couple years ago. If he still getting this, then this would definitely be a risk factor.  He does have diabetes. However, his blood sugars seem to be under fairly good control.  He has never smoked.  I would like to repeat his CT angiogram and Doppler of the right leg always see him back in 2 months. I will like to hope that his thromboembolic disease burden will be markedly decreased.  He will need lifelong anticoagulation from my point of view.  I spent a good hour with he and his wife. Of course, we had to talk about [employer] football since he played football at [employer].  We will also have to workup his hemochromatosis. I'll have to see what genetic mutation he has. We will have to follow his iron studies.  I answered all their questions.  They felt very reassured that they're being cared for by a "Christian doctor."

## 2017-07-17 LAB — LUPUS ANTICOAGULANT PANEL
PTT-LA: 32.6 s (ref 0.0–51.9)
dRVVT Confirm: 1.3 ratio — ABNORMAL HIGH (ref 0.8–1.2)
dRVVT Mix: 55 s — ABNORMAL HIGH (ref 0.0–47.0)
dRVVT: 79.6 s — ABNORMAL HIGH (ref 0.0–47.0)

## 2017-07-17 LAB — CARDIOLIPIN ANTIBODIES, IGG, IGM, IGA: Anticardiolipin Ab,IgG,Qn: <9 GPL U/mL (ref 0–14)

## 2017-07-19 LAB — PROTHROMBIN GENE MUTATION

## 2017-07-19 LAB — FACTOR 5 LEIDEN

## 2017-07-22 ENCOUNTER — Telehealth: Payer: Self-pay | Admitting: Family Medicine

## 2017-07-22 NOTE — Telephone Encounter (Signed)
[place] ph [phone]. Pt spouse says pt given protonix when at [facility]. Now only three day supply left. Spouse request send 30 day supply to [facility] in [place] and [facility].

## 2017-07-24 ENCOUNTER — Other Ambulatory Visit: Payer: Self-pay

## 2017-07-24 ENCOUNTER — Encounter: Payer: Self-pay | Admitting: Family Medicine

## 2017-07-24 MED ORDER — PANTOPRAZOLE SODIUM 40 MG PO TBEC: 40.0000 mg | DELAYED_RELEASE_TABLET | Freq: Every day | ORAL | 0 refills | Status: DC

## 2017-07-24 NOTE — Telephone Encounter (Signed)
Relation to pt: self  Call back [place]: [facility] [place], [place] [phone] (Phone) [phone] (Fax)     Reason for call:  Patient checking on the status pantoprazole (PROTONIX) 40 MG, please advise

## 2017-07-24 NOTE — Telephone Encounter (Signed)
error:[id number] ° °

## 2017-07-25 NOTE — Telephone Encounter (Signed)
rx sent to pharmacy

## 2017-08-06 ENCOUNTER — Other Ambulatory Visit: Payer: Self-pay | Admitting: Family Medicine

## 2017-08-15 MED ORDER — PANTOPRAZOLE SODIUM 40 MG PO TBEC: 40.0000 mg | DELAYED_RELEASE_TABLET | Freq: Every day | ORAL | 1 refills | Status: DC

## 2017-08-15 NOTE — Telephone Encounter (Signed)
Patient notified and [facility]

## 2017-08-15 NOTE — Telephone Encounter (Addendum)
Relation to WS:BBJX Call back [place]: [place], [place] [phone] (Phone) [phone] (Fax)     Reason for call:  Patient sent an request for 90 day supply pantoprazole (PROTONIX) 40 MG tablet on 07/22/17 and states [facility], patient would like request sent in today, please advise

## 2017-08-15 NOTE — Addendum Note (Signed)
Addended by: Kem Boroughs D on: 08/15/2017 03:32 PM   Modules accepted: Orders

## 2017-08-28 DIAGNOSIS — R809 Proteinuria, unspecified: Secondary | ICD-10-CM | POA: Diagnosis not present

## 2017-08-28 DIAGNOSIS — E1122 Type 2 diabetes mellitus with diabetic chronic kidney disease: Secondary | ICD-10-CM | POA: Diagnosis not present

## 2017-08-28 DIAGNOSIS — N183 Chronic kidney disease, stage 3 (moderate): Secondary | ICD-10-CM | POA: Diagnosis not present

## 2017-08-29 DIAGNOSIS — G8929 Other chronic pain: Secondary | ICD-10-CM | POA: Diagnosis not present

## 2017-08-29 DIAGNOSIS — M25511 Pain in right shoulder: Secondary | ICD-10-CM | POA: Diagnosis not present

## 2017-09-01 ENCOUNTER — Other Ambulatory Visit: Payer: Self-pay | Admitting: Family Medicine

## 2017-09-03 ENCOUNTER — Other Ambulatory Visit: Payer: Self-pay | Admitting: Nephrology

## 2017-09-03 DIAGNOSIS — N183 Chronic kidney disease, stage 3 unspecified: Secondary | ICD-10-CM

## 2017-09-04 DIAGNOSIS — M25511 Pain in right shoulder: Secondary | ICD-10-CM | POA: Diagnosis not present

## 2017-09-04 DIAGNOSIS — G8929 Other chronic pain: Secondary | ICD-10-CM | POA: Diagnosis not present

## 2017-09-12 DIAGNOSIS — H903 Sensorineural hearing loss, bilateral: Secondary | ICD-10-CM | POA: Diagnosis not present

## 2017-09-13 ENCOUNTER — Ambulatory Visit
Admission: RE | Admit: 2017-09-13 | Discharge: 2017-09-13 | Disposition: A | Discharge status: Home / Self Care | Payer: Medicare Other | Source: Ambulatory Visit | Attending: Nephrology | Admitting: Nephrology

## 2017-09-13 DIAGNOSIS — N183 Chronic kidney disease, stage 3 unspecified: Secondary | ICD-10-CM

## 2017-09-16 ENCOUNTER — Ambulatory Visit (HOSPITAL_BASED_OUTPATIENT_CLINIC_OR_DEPARTMENT_OTHER)
Admission: RE | Admit: 2017-09-16 | Discharge: 2017-09-16 | Disposition: A | Discharge status: Home / Self Care | Payer: Medicare Other | Source: Ambulatory Visit | Attending: Hematology & Oncology | Admitting: Hematology & Oncology

## 2017-09-16 ENCOUNTER — Other Ambulatory Visit: Payer: Self-pay

## 2017-09-16 ENCOUNTER — Ambulatory Visit (HOSPITAL_BASED_OUTPATIENT_CLINIC_OR_DEPARTMENT_OTHER): Payer: Medicare Other | Admitting: Hematology & Oncology

## 2017-09-16 ENCOUNTER — Other Ambulatory Visit (HOSPITAL_BASED_OUTPATIENT_CLINIC_OR_DEPARTMENT_OTHER): Payer: Medicare Other

## 2017-09-16 DIAGNOSIS — J432 Centrilobular emphysema: Secondary | ICD-10-CM | POA: Insufficient documentation

## 2017-09-16 DIAGNOSIS — I82441 Acute embolism and thrombosis of right tibial vein: Secondary | ICD-10-CM | POA: Diagnosis not present

## 2017-09-16 DIAGNOSIS — I82891 Chronic embolism and thrombosis of other specified veins: Secondary | ICD-10-CM | POA: Diagnosis not present

## 2017-09-16 DIAGNOSIS — I82541 Chronic embolism and thrombosis of right tibial vein: Secondary | ICD-10-CM | POA: Diagnosis not present

## 2017-09-16 DIAGNOSIS — I519 Heart disease, unspecified: Secondary | ICD-10-CM

## 2017-09-16 DIAGNOSIS — I2782 Chronic pulmonary embolism: Secondary | ICD-10-CM

## 2017-09-16 DIAGNOSIS — I2510 Atherosclerotic heart disease of native coronary artery without angina pectoris: Secondary | ICD-10-CM | POA: Insufficient documentation

## 2017-09-16 DIAGNOSIS — I82531 Chronic embolism and thrombosis of right popliteal vein: Secondary | ICD-10-CM | POA: Diagnosis not present

## 2017-09-16 DIAGNOSIS — Z7901 Long term (current) use of anticoagulants: Secondary | ICD-10-CM | POA: Diagnosis not present

## 2017-09-16 DIAGNOSIS — I2609 Other pulmonary embolism with acute cor pulmonale: Secondary | ICD-10-CM | POA: Diagnosis not present

## 2017-09-16 DIAGNOSIS — I82431 Acute embolism and thrombosis of right popliteal vein: Secondary | ICD-10-CM | POA: Diagnosis not present

## 2017-09-16 DIAGNOSIS — I700 Atherosclerosis of aorta: Secondary | ICD-10-CM | POA: Insufficient documentation

## 2017-09-16 DIAGNOSIS — J438 Other emphysema: Secondary | ICD-10-CM | POA: Insufficient documentation

## 2017-09-16 DIAGNOSIS — I82442 Acute embolism and thrombosis of left tibial vein: Secondary | ICD-10-CM | POA: Diagnosis not present

## 2017-09-16 DIAGNOSIS — E119 Type 2 diabetes mellitus without complications: Secondary | ICD-10-CM | POA: Diagnosis not present

## 2017-09-16 DIAGNOSIS — I2699 Other pulmonary embolism without acute cor pulmonale: Secondary | ICD-10-CM | POA: Diagnosis not present

## 2017-09-16 DIAGNOSIS — I2602 Saddle embolus of pulmonary artery with acute cor pulmonale: Secondary | ICD-10-CM

## 2017-09-16 LAB — CMP (CANCER CENTER ONLY)
ALT(SGPT): 29 U/L (ref 10–47)
AST: 19 U/L (ref 11–38)
Albumin: 3.8 g/dL (ref 3.3–5.5)
Alkaline Phosphatase: 62 U/L (ref 26–84)
BUN: 20 mg/dL (ref 7–22)
CHLORIDE: 107 meq/L (ref 98–108)
CO2: 29 meq/L (ref 18–33)
Calcium: 10.1 mg/dL (ref 8.0–10.3)
Creat: 1.7 mg/dl — ABNORMAL HIGH (ref 0.6–1.2)
GLUCOSE: 148 mg/dL — AB (ref 73–118)
POTASSIUM: 4.6 meq/L (ref 3.3–4.7)
SODIUM: 148 meq/L — AB (ref 128–145)
Total Bilirubin: 0.6 mg/dl (ref 0.20–1.60)
Total Protein: 6.8 g/dL (ref 6.4–8.1)

## 2017-09-16 LAB — CBC WITH DIFFERENTIAL (CANCER CENTER ONLY)
BASO#: 0 10*3/uL (ref 0.0–0.2)
BASO%: 0.9 % (ref 0.0–2.0)
EOS%: 3.1 % (ref 0.0–7.0)
Eosinophils Absolute: 0.1 10*3/uL (ref 0.0–0.5)
HEMATOCRIT: 34.9 % — AB (ref 38.7–49.9)
HGB: 11.3 g/dL — ABNORMAL LOW (ref 13.0–17.1)
LYMPH#: 1.1 10*3/uL (ref 0.9–3.3)
LYMPH%: 25.1 % (ref 14.0–48.0)
MCH: 31.2 pg (ref 28.0–33.4)
MCHC: 32.4 g/dL (ref 32.0–35.9)
MCV: 96 fL (ref 82–98)
MONO#: 0.4 10*3/uL (ref 0.1–0.9)
MONO%: 9.2 % (ref 0.0–13.0)
NEUT%: 61.7 % (ref 40.0–80.0)
NEUTROS ABS: 2.8 10*3/uL (ref 1.5–6.5)
Platelets: 271 10*3/uL (ref 145–400)
RBC: 3.62 10*6/uL — AB (ref 4.20–5.70)
RDW: 16.8 % — ABNORMAL HIGH (ref 11.1–15.7)
WBC: 4.6 10*3/uL (ref 4.0–10.0)

## 2017-09-16 LAB — IRON AND TIBC
%SAT: 17 % — AB (ref 20–55)
IRON: 55 ug/dL (ref 42–163)
TIBC: 315 ug/dL (ref 202–409)
UIBC: 261 ug/dL (ref 117–376)

## 2017-09-16 LAB — FERRITIN: FERRITIN: 25 ng/mL (ref 22–316)

## 2017-09-16 MED ORDER — IOPAMIDOL (ISOVUE-370) INJECTION 76%
100.0000 mL | Freq: Once | INTRAVENOUS | Status: AC | PRN
  Administered 2017-09-16: 100 mL via INTRAVENOUS

## 2017-09-16 NOTE — Progress Notes (Signed)
Hematology and Oncology Follow Up Visit  Louis [name] [id number] [birth year] 81 y.o. 09/16/2017   Principle Diagnosis:   Recurrent pulmonary embolism - ? (+) lupus anticouagulant  Hemochromatosis  Current Therapy:    Eliquis 5 mg po bid - lifelong  Phlebotomy to keep ferritin <100     Interim History:  Louis [name] is back for second office visit.  We first saw him back in October.  He has a diagnosis of hemochromatosis.  We are checking his hemochromatosis genetic panel today.  He also has a rheumatologic issue.  He is on methotrexate.  He has diabetes.  We did do a hypercoagulable study on him.  No surprise, he has a positive lupus anticoagulant.  Again, seem to find this on the majority of our patients.  It is hard to say if this is truly positive.  We will have to repeat this when we see him back.  We did repeat his CT angiogram.  They a CT angiogram showed persistent chronic thromboemboli in his pulmonary arteries bilaterally.  These have decreased.  We did do Dopplers of his right leg.  Again, he had a persistent thrombus in his posterior tibial vein in the right leg.  This is chronic.  This is occlusive.  He will need lifelong anticoagulation.  He has had recurrence of the pulmonary emboli.  He feels well.  There is no cough or shortness of breath.  He has had no chest wall pain.  He has had no leg swelling.  He has had no nausea or vomiting.  He has had no bleeding.  There is been no change in bowel or bladder habits.  Currently, his performance status is ECOG 1.  Medications:  Current Outpatient Medications:  .  ALPRAZolam (XANAX) 0.5 MG tablet, TAKE ONE-HALF BY MOUTH EVERY 8 TO 12 HOURS AS NEEDED (Patient taking differently: Take 0.25 mg by mouth every 8 (eight) hours as needed for sleep. ), Disp: 30 tablet, Rfl: 3 .  amoxicillin (AMOXIL) 500 MG capsule, Take 500 mg by mouth as needed., Disp: , Rfl: 0 .  apixaban (ELIQUIS) 5 MG TABS tablet, Take 1 tablet (5 mg  total) by mouth 2 (two) times daily., Disp: 180 tablet, Rfl: 0 .  aspirin EC 81 MG tablet, Take 81 mg by mouth at bedtime., Disp: , Rfl:  .  atorvastatin (LIPITOR) 20 MG tablet, TAKE 1 TABLET DAILY AT 6 P.M. (Patient taking differently: TAKE 20mg  DAILY AT 6 P.M.), Disp: 90 tablet, Rfl: 3 .  budesonide-formoterol (SYMBICORT) 80-4.5 MCG/ACT inhaler, Inhale 2 puffs into the lungs 2 (two) times daily., Disp: 1 Inhaler, Rfl: 0 .  CALCIUM-VITAMIN D PO, Take 1 tablet by mouth daily. , Disp: , Rfl:  .  fenofibrate 160 MG tablet, TAKE 1 TABLET(160 MG) BY MOUTH DAILY, Disp: 30 tablet, Rfl: 0 .  fluticasone (FLONASE) 50 MCG/ACT nasal spray, Place 2 sprays into both nostrils daily., Disp: 16 g, Rfl: 1 .  folic acid (FOLVITE) 1 MG tablet, Take 1 mg by mouth daily.  , Disp: , Rfl:  .  HYDROcodone-acetaminophen (NORCO) 10-325 MG per tablet, Take 1 tablet by mouth every 4 (four) hours as needed for moderate pain or severe pain (pain). , Disp: , Rfl:  .  levocetirizine (XYZAL) 5 MG tablet, Take 1 tablet (5 mg total) by mouth every evening., Disp: 30 tablet, Rfl: 5 .  levothyroxine (SYNTHROID, LEVOTHROID) 125 MCG tablet, TAKE 1 TABLET(125 MCG) BY MOUTH DAILY, Disp: 90 tablet, Rfl: 0 .  methocarbamol (ROBAXIN) 500 MG tablet, Take 1 tablet (500 mg total) by mouth every 6 (six) hours as needed for muscle spasms., Disp: 40 tablet, Rfl: 3 .  methotrexate (RHEUMATREX) 2.5 MG tablet, Take 20 mg by mouth every Monday. , Disp: , Rfl:  .  multivitamin (THERAGRAN) per tablet, Take 1 tablet by mouth daily.  , Disp: , Rfl:  .  ondansetron (ZOFRAN) 4 MG tablet, Take 1 tablet (4 mg total) by mouth every 8 (eight) hours as needed for nausea or vomiting., Disp: 20 tablet, Rfl: 0 .  pantoprazole (PROTONIX) 40 MG tablet, Take 1 tablet (40 mg total) daily by mouth., Disp: 90 tablet, Rfl: 1 .  pioglitazone (ACTOS) 15 MG tablet, TAKE 1 TABLET DAILY (Patient taking differently: TAKE 15mg  by mouth once DAILY), Disp: 90 tablet, Rfl: 3 .   SHINGRIX injection, ADM 0.5ML IM UTD, Disp: , Rfl: 0 .  vitamin B-12 (CYANOCOBALAMIN) 1000 MCG tablet, Take 1,000 mcg by mouth daily., Disp: , Rfl:   Allergies:  Allergies  Allergen Reactions  . Shellfish Allergy Anaphylaxis    ANGIOEDEMA  . Garlic Nausea And Vomiting  . Iron Other (See Comments)    hemacomatosis    Past Medical History, Surgical history, Social history, and Family History were reviewed and updated.  Review of Systems: Review of Systems  Constitutional: Negative for appetite change, fatigue, fever and unexpected weight change.  HENT:   Negative for lump/mass, mouth sores, sore throat and trouble swallowing.   Respiratory: Negative for cough, hemoptysis and shortness of breath.   Cardiovascular: Negative for leg swelling and palpitations.  Gastrointestinal: Negative for abdominal distention, abdominal pain, blood in stool, constipation, diarrhea, nausea and vomiting.  Genitourinary: Negative for bladder incontinence, dysuria, frequency and hematuria.   Musculoskeletal: Negative for arthralgias, back pain, gait problem and myalgias.  Skin: Negative for itching and rash.  Neurological: Negative for dizziness, extremity weakness, gait problem, headaches, numbness, seizures and speech difficulty.  Hematological: Does not bruise/bleed easily.  Psychiatric/Behavioral: Negative for depression and sleep disturbance. The patient is not nervous/anxious.     Physical Exam:  weight is 222 lb (100.7 kg). His oral temperature is 98.2 F (36.8 C). His blood pressure is 127/99 (abnormal) and his pulse is 62. His respiration is 18 and oxygen saturation is 99%.   Wt Readings from Last 3 Encounters:  09/16/17 222 lb (100.7 kg)  07/15/17 220 lb 6.4 oz (100 kg)  06/27/17 221 lb 3.2 oz (100.3 kg)    Physical Exam  Constitutional: He is oriented to person, place, and time.  HENT:  Head: Normocephalic and atraumatic.  Mouth/Throat: Oropharynx is clear and moist.  Eyes: EOM are  normal. Pupils are equal, round, and reactive to light.  Neck: Normal range of motion.  Cardiovascular: Normal rate, regular rhythm and normal heart sounds.  Pulmonary/Chest: Effort normal and breath sounds normal.  Abdominal: Soft. Bowel sounds are normal.  Musculoskeletal: Normal range of motion. He exhibits no edema, tenderness or deformity.  Lymphadenopathy:    He has no cervical adenopathy.  Neurological: He is alert and oriented to person, place, and time.  Skin: Skin is warm and dry. No rash noted. No erythema.  Psychiatric: He has a normal mood and affect. His behavior is normal. Judgment and thought content normal.  Vitals reviewed.    Lab Results  Component Value Date   WBC 4.6 09/16/2017   HGB 11.3 (L) 09/16/2017   HCT 34.9 (L) 09/16/2017   MCV 96 09/16/2017   PLT 271  09/16/2017     Chemistry      Component Value Date/Time   NA 148 (H) 09/16/2017 1051   K 4.6 09/16/2017 1051   CL 107 09/16/2017 1051   CO2 29 09/16/2017 1051   BUN 20 09/16/2017 1051   CREATININE 1.7 (H) 09/16/2017 1051      Component Value Date/Time   CALCIUM 10.1 09/16/2017 1051   ALKPHOS 62 09/16/2017 1051   AST 19 09/16/2017 1051   ALT 29 09/16/2017 1051   BILITOT 0.60 09/16/2017 1051         Impression and Plan: Mr. Stcharles is a 81 year old white male.  He has had past thromboembolic events.  He has had past pulmonary emboli.  It is hard to know if this is a true lupus anticoagulant.  Again, seems like most of the patient's we see now have a positive test.  He has hemochromatosis.  His iron studies are very good.  His ferritin is only 25.  His iron saturation is only 17%.  We will have to see what his genetic studies show.  I do not know if his rheumatologic condition is a factor.  This certainly could lead to a hypercoagulable state.  He is diabetic.  Diabetes clearly will increase his risk of thromboembolic events.  I would not repeat his next CT angiogram/Doppler for 6  months.  I would like to see him back in 3 months.  We will check his lupus anticoagulant.  If he is positive at that time, that he will need low-dose aspirin.  I spent about 30 minutes with he and his wife.  I went over the lab work and the scans.  I answered all their questions.   Volanda Napoleon, MD 12/17/20186:16 PM

## 2017-09-16 NOTE — Progress Notes (Signed)
Referral MD  Reason for Referral: Recurrent bilateral pulmonary emboli and right lower extremity thrombus                                    Hemochromatosis   Chief Complaint  Patient presents with  . Follow-up  : I had another blood clot in my lungs.  HPI: Louis [name] is an incredibly interesting 81 year old white male. To my surprise into my enjoyment, he played football had [employer] back in the 50s. He was incredibly impressed that I had a [employer] exam room. Unfortunately, he was not in that room today.  He is retired. He retired in his mid 62s from [employer]. 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  He has had multiple surgeries. His last surgery was a back surgery in the lumbar spine back about a year or so ago.  He also has a diagnosis of hemochromatosis. From what is to has, he's never had any genetic studies done. He says this was made by a liver biopsy. He says that he has had phlebotomies. He says his blood been about 2 years since he was phlebotomized.  The last iron studies that I have on him were back in August when his ferritin was only 24. He says his brother has hemachromatosis.  His history of blood clots goes back to November of 2015 when he had pulmonary emboli. He has some shortness of breath. He had no chest pain. No hemoptysis. He had not been on any long travel. He apparently had been getting testosterone injections. He is not sure how long he was on Xarelto. He says he was on it for a month or so. I have a hard time believing this. Actually, it looks like he was on Xarelto for 3 months. He has managed by Dr. Unice Cobble. From a note in early March, he says that he had been off anticoagulant for 2-3 weeks.  Most recently, he had some more shortness of breath. This lasted for about 3 weeks. It kept getting worse. He following went to see his family doctor. She went ahead and ordered a d-dimer and some x-rays. X-rays were negative. The d-dimer was over  5. He was then sent to the hospital. He had a CT angiogram done on September 18. This showed bilateral pulmonary emboli. There was some right heart strain. There is some mild atelectasis at each lung base.  He was admitted. He was started on heparin.  He had a Doppler of his legs on September 19. He had an acute thrombus in the right popliteal and posterior tibial vein.  He is now placed on ELIQUIS.  He was referred to hematology for follow-up and management of his thromboembolic disease.  He feels much better. He does not have any chest pain. There is no cough and he has no hemoptysis. There is no bleeding.  He really never had any leg pain.  He and his wife are very nice.  Overall, I would say that his performance status is ECOG 1.     Past Medical History:  Diagnosis Date  . Anemia   . Arthritis    Dr Amil Amen, [facility] rx- Rheumatology  . Diabetes mellitus   . Diverticulosis   . GERD (gastroesophageal reflux disease)   . Hemochromatosis     Dr Henrene Pastor  . History of kidney stones 1980   passed spont.  Marland Kitchen  Hx of adenomatous colonic polyps   . Hyperlipidemia   . Hypertension   . Hypothyroidism   . IBS (irritable bowel syndrome)   . Internal hemorrhoids   . Kidney stones, calcium oxalate    1985 & 2005  . Pulmonary embolism ([place]) 2015   hosp. with Heparin & then on Xarelto- 3 months or so  . Reflux esophagitis   . SBO (small bowel obstruction) ([place]) 2004  . Spinal stenosis    Dr.Ramos  . Testosterone deficiency    111 in 2007  :   Past Surgical History:  Procedure Laterality Date  . APPENDECTOMY    . cataract  2005   bilaterally  . COLONOSCOPY W/ POLYPECTOMY  2005   tubular adenoma  . JOINT REPLACEMENT     L- shoulder, x2, replacement on both knees   . L4-5 & L5- S1 bilat facet injections  01/20/14    Dr Nelva Bush  . LUMBAR LAMINECTOMY WITH COFLEX 2 LEVEL N/A 07/30/2016   Procedure: Lumbar three- four Lumbar four - five Laminectomy and foraminotomy;  Surgeon: Kristeen Miss, MD;  Location: [facility];  Service: Neurosurgery;  Laterality: N/A;  L3-4 L4-5 Laminectomy with coflex  . lumbar nerve block      X 14; Dr Nelva Bush  . MIDDLE EAR SURGERY  1980   Teflon tack   . REFRACTIVE SURGERY  2005   SE Ophth  . SHOULDER SURGERY      2 on L ; 3 on R  . TM replacement  1981  . TONSILLECTOMY AND ADENOIDECTOMY    . TOTAL KNEE ARTHROPLASTY   201 & 2012    bilaterally;Dr Alusio  . TOTAL SHOULDER REPLACEMENT     L  :   Current Outpatient Medications:  .  ALPRAZolam (XANAX) 0.5 MG tablet, TAKE ONE-HALF BY MOUTH EVERY 8 TO 12 HOURS AS NEEDED (Patient taking differently: Take 0.25 mg by mouth every 8 (eight) hours as needed for sleep. ), Disp: 30 tablet, Rfl: 3 .  amoxicillin (AMOXIL) 500 MG capsule, Take 500 mg by mouth as needed., Disp: , Rfl: 0 .  apixaban (ELIQUIS) 5 MG TABS tablet, Take 1 tablet (5 mg total) by mouth 2 (two) times daily., Disp: 180 tablet, Rfl: 0 .  aspirin EC 81 MG tablet, Take 81 mg by mouth at bedtime., Disp: , Rfl:  .  atorvastatin (LIPITOR) 20 MG tablet, TAKE 1 TABLET DAILY AT 6 P.M. (Patient taking differently: TAKE 20mg  DAILY AT 6 P.M.), Disp: 90 tablet, Rfl: 3 .  budesonide-formoterol (SYMBICORT) 80-4.5 MCG/ACT inhaler, Inhale 2 puffs into the lungs 2 (two) times daily., Disp: 1 Inhaler, Rfl: 0 .  CALCIUM-VITAMIN D PO, Take 1 tablet by mouth daily. , Disp: , Rfl:  .  fenofibrate 160 MG tablet, TAKE 1 TABLET(160 MG) BY MOUTH DAILY, Disp: 30 tablet, Rfl: 0 .  fluticasone (FLONASE) 50 MCG/ACT nasal spray, Place 2 sprays into both nostrils daily., Disp: 16 g, Rfl: 1 .  folic acid (FOLVITE) 1 MG tablet, Take 1 mg by mouth daily.  , Disp: , Rfl:  .  HYDROcodone-acetaminophen (NORCO) 10-325 MG per tablet, Take 1 tablet by mouth every 4 (four) hours as needed for moderate pain or severe pain (pain). , Disp: , Rfl:  .  levocetirizine (XYZAL) 5 MG tablet, Take 1 tablet (5 mg total) by mouth every evening., Disp: 30 tablet, Rfl: 5 .  levothyroxine  (SYNTHROID, LEVOTHROID) 125 MCG tablet, TAKE 1 TABLET(125 MCG) BY MOUTH DAILY, Disp: 90 tablet, Rfl:  0 .  methocarbamol (ROBAXIN) 500 MG tablet, Take 1 tablet (500 mg total) by mouth every 6 (six) hours as needed for muscle spasms., Disp: 40 tablet, Rfl: 3 .  methotrexate (RHEUMATREX) 2.5 MG tablet, Take 20 mg by mouth every Monday. , Disp: , Rfl:  .  multivitamin (THERAGRAN) per tablet, Take 1 tablet by mouth daily.  , Disp: , Rfl:  .  ondansetron (ZOFRAN) 4 MG tablet, Take 1 tablet (4 mg total) by mouth every 8 (eight) hours as needed for nausea or vomiting., Disp: 20 tablet, Rfl: 0 .  pantoprazole (PROTONIX) 40 MG tablet, Take 1 tablet (40 mg total) daily by mouth., Disp: 90 tablet, Rfl: 1 .  pioglitazone (ACTOS) 15 MG tablet, TAKE 1 TABLET DAILY (Patient taking differently: TAKE 15mg  by mouth once DAILY), Disp: 90 tablet, Rfl: 3 .  SHINGRIX injection, ADM 0.5ML IM UTD, Disp: , Rfl: 0 .  vitamin B-12 (CYANOCOBALAMIN) 1000 MCG tablet, Take 1,000 mcg by mouth daily., Disp: , Rfl: :  :   Allergies  Allergen Reactions  . Shellfish Allergy Anaphylaxis    ANGIOEDEMA  . Garlic Nausea And Vomiting  . Iron Other (See Comments)    hemacomatosis  :   Family History  Problem Relation Age of Onset  . Diabetes Maternal Aunt   . Depression Brother        suicide  . Diabetes Father   . Stroke Father 10       MI in hospital for CVA  . Heart failure Mother        valvular disease  . Heart disease Paternal Aunt         2 aunts; 1 had MI @ ?> 60  . Heart disease Paternal Uncle           . Breast cancer Maternal Grandmother   . Colon cancer Neg Hx   . COPD Neg Hx   . Asthma Neg Hx   :   Social History   Socioeconomic History  . Marital status: Married    Spouse name: Not on file  . Number of children: Not on file  . Years of education: Not on file  . Highest education level: Not on file  Social Needs  . Financial resource strain: Not on file  . Food insecurity - worry: Not on file   . Food insecurity - inability: Not on file  . Transportation needs - medical: Not on file  . Transportation needs - non-medical: Not on file  Occupational History  . Not on file  Tobacco Use  . Smoking status: Former Smoker    Last attempt to quit: 10/01/1980    Years since quitting: 36.9  . Smokeless tobacco: Never Used  Substance and Sexual Activity  . Alcohol use: Yes    Alcohol/week: 0.0 oz    Comment: rare   . Drug use: No  . Sexual activity: Not Currently  Other Topics Concern  . Not on file  Social History Narrative   Daily caffeine   :  Pertinent items are noted in HPI.  Exam: Well-developed well-nourished white male in no obvious distress. Vital signs show temperature of 97.9. Pulse 65. Blood pressure 145/62. Weight is 220 pounds. Head and neck exam shows no ocular or oral lesions. There are no palpable cervical or supraclavicular lymph nodes. Lungs are clear bilaterally. No friction rubs are noted. He has good air movement bilaterally. Cardiac exam regular rate and rhythm with no murmurs, rubs or bruits. Abdomen is  soft. He has good bowel sounds. There is no fluid wave. There is no palpable liver or spleen tip. Back exam shows the laminectomy scar in the lumbar spine. He has no tenderness over the spine, ribs or hips. Extremities shows no clubbing, cyanosis or edema. He has no obvious venous cord in his legs. He has a negative Homans sign in his legs. He has good strength in his legs. Neurological exam shows no focal neurological deficits. Skin exam shows no rashes, ecchymoses or petechia.    Recent Labs    09/16/17 1051  WBC 4.6  HGB 11.3*  HCT 34.9*  PLT 271    Recent Labs    09/16/17 1051  NA 148*  K 4.6  CL 107  CO2 29  GLUCOSE 148*  BUN 20  CREATININE 1.7*  CALCIUM 10.1    Blood smear review:  None  Pathology: None     Assessment and Plan:  Mr. Mcginley is a 81 year old white male. He has recurrent thromboembolic disease is lungs and in his right  leg.  He was only on Xarelto 3 months with his first episode of pulmonary emboli. This should be adequate. However, it is possible that this may not have been long enough. However, I don't think this would've been a problem.  He had a normal hypercoagulable panel back 2 years ago. I'm going to send off a factor V Leiden and Prothrombin II gene mutation study. There is no family history of blood clots.  I'm not sure he is still getting testosterone. This cervicodynia been a risk factor a couple years ago. If he still getting this, then this would definitely be a risk factor.  He does have diabetes. However, his blood sugars seem to be under fairly good control.  He has never smoked.  I would like to repeat his CT angiogram and Doppler of the right leg always see him back in 2 months. I will like to hope that his thromboembolic disease burden will be markedly decreased.  He will need lifelong anticoagulation from my point of view.  I spent a good hour with he and his wife. Of course, we had to talk about [employer] football since he played football at [employer].  We will also have to workup his hemochromatosis. I'll have to see what genetic mutation he has. We will have to follow his iron studies.  I answered all their questions.  They felt very reassured that they're being cared for by a "Christian doctor."

## 2017-09-17 DIAGNOSIS — M12811 Other specific arthropathies, not elsewhere classified, right shoulder: Secondary | ICD-10-CM | POA: Diagnosis not present

## 2017-09-17 DIAGNOSIS — M19011 Primary osteoarthritis, right shoulder: Secondary | ICD-10-CM | POA: Diagnosis not present

## 2017-09-19 ENCOUNTER — Telehealth: Payer: Self-pay | Admitting: *Deleted

## 2017-09-19 LAB — HEMOCHROMATOSIS DNA-PCR(C282Y,H63D)

## 2017-09-19 NOTE — Telephone Encounter (Signed)
Received Medical/Surgical Clearance Form from [facility], attached last EKG & last OV notes; forwarded to provider/SLS 12/20

## 2017-09-21 ENCOUNTER — Other Ambulatory Visit: Payer: Self-pay | Admitting: Family Medicine

## 2017-09-21 DIAGNOSIS — F411 Generalized anxiety disorder: Secondary | ICD-10-CM

## 2017-09-25 NOTE — Telephone Encounter (Signed)
Pt is requesting refill on alprazolam.  Last OV: 06/27/2017 Last Fill: 02/19/2017 #30 and 3rf UDS: 10/12/2016 Low risk  Will you refill in PCP absence?

## 2017-09-25 NOTE — Telephone Encounter (Signed)
Sent!

## 2017-10-03 ENCOUNTER — Other Ambulatory Visit: Payer: Self-pay | Admitting: Family Medicine

## 2017-10-13 ENCOUNTER — Emergency Department (HOSPITAL_COMMUNITY): Payer: Medicare Other

## 2017-10-13 ENCOUNTER — Observation Stay (HOSPITAL_COMMUNITY): Payer: Medicare Other

## 2017-10-13 ENCOUNTER — Inpatient Hospital Stay (HOSPITAL_COMMUNITY)
Admission: EM | Admit: 2017-10-13 | Discharge: 2017-10-16 | DRG: 388 | Disposition: A | Discharge status: Home / Self Care | Payer: Medicare Other | Attending: Internal Medicine | Admitting: Internal Medicine

## 2017-10-13 ENCOUNTER — Encounter (HOSPITAL_COMMUNITY): Payer: Self-pay | Admitting: Emergency Medicine

## 2017-10-13 DIAGNOSIS — N2889 Other specified disorders of kidney and ureter: Secondary | ICD-10-CM | POA: Diagnosis present

## 2017-10-13 DIAGNOSIS — Z7951 Long term (current) use of inhaled steroids: Secondary | ICD-10-CM

## 2017-10-13 DIAGNOSIS — Z86711 Personal history of pulmonary embolism: Secondary | ICD-10-CM | POA: Diagnosis not present

## 2017-10-13 DIAGNOSIS — Z8249 Family history of ischemic heart disease and other diseases of the circulatory system: Secondary | ICD-10-CM

## 2017-10-13 DIAGNOSIS — Z818 Family history of other mental and behavioral disorders: Secondary | ICD-10-CM

## 2017-10-13 DIAGNOSIS — K56609 Unspecified intestinal obstruction, unspecified as to partial versus complete obstruction: Secondary | ICD-10-CM | POA: Diagnosis present

## 2017-10-13 DIAGNOSIS — E039 Hypothyroidism, unspecified: Secondary | ICD-10-CM | POA: Diagnosis present

## 2017-10-13 DIAGNOSIS — D6862 Lupus anticoagulant syndrome: Secondary | ICD-10-CM | POA: Diagnosis present

## 2017-10-13 DIAGNOSIS — Z7982 Long term (current) use of aspirin: Secondary | ICD-10-CM

## 2017-10-13 DIAGNOSIS — Z823 Family history of stroke: Secondary | ICD-10-CM

## 2017-10-13 DIAGNOSIS — K2970 Gastritis, unspecified, without bleeding: Secondary | ICD-10-CM | POA: Diagnosis not present

## 2017-10-13 DIAGNOSIS — M1990 Unspecified osteoarthritis, unspecified site: Secondary | ICD-10-CM | POA: Diagnosis not present

## 2017-10-13 DIAGNOSIS — K56600 Partial intestinal obstruction, unspecified as to cause: Secondary | ICD-10-CM | POA: Diagnosis not present

## 2017-10-13 DIAGNOSIS — E118 Type 2 diabetes mellitus with unspecified complications: Secondary | ICD-10-CM | POA: Diagnosis present

## 2017-10-13 DIAGNOSIS — R112 Nausea with vomiting, unspecified: Secondary | ICD-10-CM | POA: Diagnosis not present

## 2017-10-13 DIAGNOSIS — M069 Rheumatoid arthritis, unspecified: Secondary | ICD-10-CM | POA: Diagnosis present

## 2017-10-13 DIAGNOSIS — Z7901 Long term (current) use of anticoagulants: Secondary | ICD-10-CM

## 2017-10-13 DIAGNOSIS — Z833 Family history of diabetes mellitus: Secondary | ICD-10-CM

## 2017-10-13 DIAGNOSIS — Z91013 Allergy to seafood: Secondary | ICD-10-CM

## 2017-10-13 DIAGNOSIS — E1122 Type 2 diabetes mellitus with diabetic chronic kidney disease: Secondary | ICD-10-CM | POA: Diagnosis not present

## 2017-10-13 DIAGNOSIS — K5791 Diverticulosis of intestine, part unspecified, without perforation or abscess with bleeding: Secondary | ICD-10-CM | POA: Diagnosis present

## 2017-10-13 DIAGNOSIS — I129 Hypertensive chronic kidney disease with stage 1 through stage 4 chronic kidney disease, or unspecified chronic kidney disease: Secondary | ICD-10-CM | POA: Diagnosis present

## 2017-10-13 DIAGNOSIS — N201 Calculus of ureter: Secondary | ICD-10-CM | POA: Diagnosis not present

## 2017-10-13 DIAGNOSIS — Z96653 Presence of artificial knee joint, bilateral: Secondary | ICD-10-CM | POA: Diagnosis present

## 2017-10-13 DIAGNOSIS — E1151 Type 2 diabetes mellitus with diabetic peripheral angiopathy without gangrene: Secondary | ICD-10-CM | POA: Diagnosis not present

## 2017-10-13 DIAGNOSIS — R110 Nausea: Secondary | ICD-10-CM

## 2017-10-13 DIAGNOSIS — E785 Hyperlipidemia, unspecified: Secondary | ICD-10-CM | POA: Diagnosis present

## 2017-10-13 DIAGNOSIS — K219 Gastro-esophageal reflux disease without esophagitis: Secondary | ICD-10-CM | POA: Diagnosis present

## 2017-10-13 DIAGNOSIS — K210 Gastro-esophageal reflux disease with esophagitis: Secondary | ICD-10-CM | POA: Diagnosis present

## 2017-10-13 DIAGNOSIS — N183 Chronic kidney disease, stage 3 unspecified: Secondary | ICD-10-CM | POA: Diagnosis present

## 2017-10-13 DIAGNOSIS — M48061 Spinal stenosis, lumbar region without neurogenic claudication: Secondary | ICD-10-CM | POA: Diagnosis not present

## 2017-10-13 DIAGNOSIS — Z79890 Hormone replacement therapy (postmenopausal): Secondary | ICD-10-CM

## 2017-10-13 DIAGNOSIS — Z4682 Encounter for fitting and adjustment of non-vascular catheter: Secondary | ICD-10-CM | POA: Diagnosis not present

## 2017-10-13 DIAGNOSIS — Z803 Family history of malignant neoplasm of breast: Secondary | ICD-10-CM

## 2017-10-13 DIAGNOSIS — I2782 Chronic pulmonary embolism: Secondary | ICD-10-CM | POA: Diagnosis present

## 2017-10-13 DIAGNOSIS — Z96612 Presence of left artificial shoulder joint: Secondary | ICD-10-CM | POA: Diagnosis present

## 2017-10-13 DIAGNOSIS — Z91018 Allergy to other foods: Secondary | ICD-10-CM

## 2017-10-13 DIAGNOSIS — I82501 Chronic embolism and thrombosis of unspecified deep veins of right lower extremity: Secondary | ICD-10-CM | POA: Diagnosis not present

## 2017-10-13 DIAGNOSIS — Z888 Allergy status to other drugs, medicaments and biological substances status: Secondary | ICD-10-CM

## 2017-10-13 DIAGNOSIS — Z87442 Personal history of urinary calculi: Secondary | ICD-10-CM

## 2017-10-13 DIAGNOSIS — Z86010 Personal history of colonic polyps: Secondary | ICD-10-CM

## 2017-10-13 DIAGNOSIS — R1011 Right upper quadrant pain: Secondary | ICD-10-CM | POA: Diagnosis not present

## 2017-10-13 DIAGNOSIS — Z87891 Personal history of nicotine dependence: Secondary | ICD-10-CM

## 2017-10-13 DIAGNOSIS — I10 Essential (primary) hypertension: Secondary | ICD-10-CM | POA: Diagnosis present

## 2017-10-13 LAB — COMPREHENSIVE METABOLIC PANEL
ALK PHOS: 47 U/L (ref 38–126)
ALT: 18 U/L (ref 17–63)
AST: 22 U/L (ref 15–41)
Albumin: 4.1 g/dL (ref 3.5–5.0)
Anion gap: 9 (ref 5–15)
BUN: 18 mg/dL (ref 6–20)
CALCIUM: 8.8 mg/dL — AB (ref 8.9–10.3)
CO2: 24 mmol/L (ref 22–32)
CREATININE: 1.72 mg/dL — AB (ref 0.61–1.24)
Chloride: 104 mmol/L (ref 101–111)
GFR calc Af Amer: 42 mL/min — ABNORMAL LOW (ref >60)
GFR, EST NON AFRICAN AMERICAN: 36 mL/min — AB (ref >60)
Glucose, Bld: 129 mg/dL — ABNORMAL HIGH (ref 65–99)
Potassium: 3.7 mmol/L (ref 3.5–5.1)
Sodium: 137 mmol/L (ref 135–145)
TOTAL PROTEIN: 6.5 g/dL (ref 6.5–8.1)
Total Bilirubin: 0.8 mg/dL (ref 0.3–1.2)

## 2017-10-13 LAB — CBC WITH DIFFERENTIAL/PLATELET
Basophils Absolute: 0 10*3/uL (ref 0.0–0.1)
Basophils Relative: 0 %
EOS PCT: 1 %
Eosinophils Absolute: 0.1 10*3/uL (ref 0.0–0.7)
HCT: 35.3 % — ABNORMAL LOW (ref 39.0–52.0)
Hemoglobin: 11.2 g/dL — ABNORMAL LOW (ref 13.0–17.0)
LYMPHS ABS: 1.1 10*3/uL (ref 0.7–4.0)
LYMPHS PCT: 19 %
MCH: 29.9 pg (ref 26.0–34.0)
MCHC: 31.7 g/dL (ref 30.0–36.0)
MCV: 94.4 fL (ref 78.0–100.0)
Monocytes Absolute: 0.2 10*3/uL (ref 0.1–1.0)
Monocytes Relative: 4 %
Neutro Abs: 4.3 10*3/uL (ref 1.7–7.7)
Neutrophils Relative %: 76 %
Platelets: 292 10*3/uL (ref 150–400)
RBC: 3.74 MIL/uL — AB (ref 4.22–5.81)
RDW: 16.7 % — ABNORMAL HIGH (ref 11.5–15.5)
WBC: 5.7 10*3/uL (ref 4.0–10.5)

## 2017-10-13 LAB — I-STAT CG4 LACTIC ACID, ED: LACTIC ACID, VENOUS: 0.9 mmol/L (ref 0.5–1.9)

## 2017-10-13 LAB — LIPASE, BLOOD: LIPASE: 23 U/L (ref 11–51)

## 2017-10-13 MED ORDER — SODIUM CHLORIDE 0.9 % IV SOLN
INTRAVENOUS | Status: DC
  Administered 2017-10-13 – 2017-10-14 (×3): via INTRAVENOUS
  Administered 2017-10-15 (×2): 1 mL via INTRAVENOUS

## 2017-10-13 MED ORDER — IOPAMIDOL (ISOVUE-300) INJECTION 61%
INTRAVENOUS | Status: AC
  Administered 2017-10-13: 75 mL
  Filled 2017-10-13: qty 75

## 2017-10-13 MED ORDER — ONDANSETRON HCL 4 MG/2ML IJ SOLN
4.0000 mg | Freq: Once | INTRAMUSCULAR | Status: AC
  Administered 2017-10-13: 4 mg via INTRAVENOUS
  Filled 2017-10-13: qty 2

## 2017-10-13 MED ORDER — METHOTREXATE 2.5 MG PO TABS
20.0000 mg | ORAL_TABLET | ORAL | Status: DC
  Administered 2017-10-14: 20 mg via ORAL
  Filled 2017-10-13: qty 8

## 2017-10-13 MED ORDER — ALPRAZOLAM 0.25 MG PO TABS: 0.5000 mg | ORAL_TABLET | Freq: Every evening | ORAL | Status: DC | PRN

## 2017-10-13 MED ORDER — ACETAMINOPHEN 650 MG RE SUPP: 650.0000 mg | Freq: Four times a day (QID) | RECTAL | Status: DC | PRN

## 2017-10-13 MED ORDER — SODIUM CHLORIDE 0.9% FLUSH
3.0000 mL | Freq: Two times a day (BID) | INTRAVENOUS | Status: DC
  Administered 2017-10-14 – 2017-10-16 (×4): 3 mL via INTRAVENOUS

## 2017-10-13 MED ORDER — PANTOPRAZOLE SODIUM 40 MG IV SOLR
40.0000 mg | INTRAVENOUS | Status: DC
  Administered 2017-10-14 – 2017-10-15 (×3): 40 mg via INTRAVENOUS
  Filled 2017-10-13 (×3): qty 40

## 2017-10-13 MED ORDER — ACETAMINOPHEN 325 MG PO TABS: 650.0000 mg | ORAL_TABLET | Freq: Four times a day (QID) | ORAL | Status: DC | PRN

## 2017-10-13 MED ORDER — FLUTICASONE PROPIONATE 50 MCG/ACT NA SUSP
2.0000 | Freq: Every day | NASAL | Status: DC | PRN
  Filled 2017-10-13: qty 16

## 2017-10-13 MED ORDER — LEVOTHYROXINE SODIUM 125 MCG PO TABS
125.0000 ug | ORAL_TABLET | Freq: Every day | ORAL | Status: DC
  Administered 2017-10-14 – 2017-10-16 (×3): 125 ug via ORAL
  Filled 2017-10-13 (×3): qty 1

## 2017-10-13 MED ORDER — SODIUM CHLORIDE 0.9% FLUSH: 3.0000 mL | INTRAVENOUS | Status: DC | PRN

## 2017-10-13 MED ORDER — FENTANYL CITRATE (PF) 100 MCG/2ML IJ SOLN
50.0000 ug | INTRAMUSCULAR | Status: DC | PRN
Start: 2017-10-13 — End: 2017-10-16
  Administered 2017-10-13 – 2017-10-16 (×19): 50 ug via INTRAVENOUS
  Filled 2017-10-13 (×19): qty 2

## 2017-10-13 MED ORDER — APIXABAN 5 MG PO TABS
5.0000 mg | ORAL_TABLET | Freq: Two times a day (BID) | ORAL | Status: DC
  Filled 2017-10-13: qty 1

## 2017-10-13 MED ORDER — ONDANSETRON HCL 4 MG PO TABS: 4.0000 mg | ORAL_TABLET | Freq: Four times a day (QID) | ORAL | Status: DC | PRN

## 2017-10-13 MED ORDER — SODIUM CHLORIDE 0.9 % IV SOLN: Freq: Once | INTRAVENOUS | Status: DC

## 2017-10-13 MED ORDER — SODIUM CHLORIDE 0.9 % IV BOLUS (SEPSIS)
500.0000 mL | Freq: Once | INTRAVENOUS | Status: AC
  Administered 2017-10-13: 500 mL via INTRAVENOUS

## 2017-10-13 MED ORDER — SODIUM CHLORIDE 0.9 % IV SOLN: 250.0000 mL | INTRAVENOUS | Status: DC | PRN

## 2017-10-13 MED ORDER — INSULIN ASPART 100 UNIT/ML ~~LOC~~ SOLN: 0.0000 [IU] | Freq: Three times a day (TID) | SUBCUTANEOUS | Status: DC

## 2017-10-13 MED ORDER — ONDANSETRON HCL 4 MG/2ML IJ SOLN
4.0000 mg | Freq: Four times a day (QID) | INTRAMUSCULAR | Status: DC | PRN
  Administered 2017-10-14 – 2017-10-15 (×3): 4 mg via INTRAVENOUS
  Filled 2017-10-13 (×3): qty 2

## 2017-10-13 NOTE — H&P (Addendum)
History and Physical  Louis [name]. AST:[id number] DOB: 11-16-1935 DOA: 10/13/2017  Referring physician: ED PCP: Ann Held, DO  Outpatient Specialists: Hematologist, Nephrologist Patient coming from: Home & is able to ambulate independently  Chief Complaint: Abdominal pain + nausea   HPI: Louis [name]. is a 82 y.o. male with medical history significant for recurrent PE on lifelong anticoagulant, diabetes type 2, hyperlipidemia, hemochromatosis, rheumatoid arthritis presents to the ED complaining of abdominal pain, nausea and dry heaving for the past 4 days.  Reports abdominal pain is worse on the right side and moves across his lower abdomen, describes it as twisting in nature, associated with nausea and dry heaving but denies vomiting.  No bowel movement in 3 days. Patient denies any chest pain, shortness of breath, cough, fever/chills, diarrhea.  In the ED, patient remained stable CT abdomen and pelvis showed partial small bowel obstruction.  Of note, patient reports having a small bowel obstruction in the past last episode was around 15 years ago.  Patient has a history of appendectomy about 50 years ago.  Patient has not been able to keep any solid food down due to extreme nausea.  Patient admitted for further management.  ED Course: CT showed partial small bowel obstruction, patient was given IV fluids, antiemetics, IV fentanyl and an NG tube was placed.  General surgery was consulted  Review of Systems: Review of systems are otherwise negative   Past Medical History:  Diagnosis Date  . Anemia   . Arthritis    Dr Amil Amen, [facility] rx- Rheumatology  . Diabetes mellitus   . Diverticulosis   . GERD (gastroesophageal reflux disease)   . Hemochromatosis     Dr Henrene Pastor  . History of kidney stones 1980   passed spont.  Marland Kitchen Hx of adenomatous colonic polyps   . Hyperlipidemia   . Hypertension   . Hypothyroidism   . IBS (irritable bowel syndrome)   . Internal  hemorrhoids   . Kidney stones, calcium oxalate    1985 & 2005  . Pulmonary embolism ([place]) 2015   hosp. with Heparin & then on Xarelto- 3 months or so  . Reflux esophagitis   . SBO (small bowel obstruction) ([place]) 2004  . Spinal stenosis    Dr.Ramos  . Testosterone deficiency    111 in 2007   Past Surgical History:  Procedure Laterality Date  . APPENDECTOMY    . cataract  2005   bilaterally  . COLONOSCOPY W/ POLYPECTOMY  2005   tubular adenoma  . JOINT REPLACEMENT     L- shoulder, x2, replacement on both knees   . L4-5 & L5- S1 bilat facet injections  01/20/14    Dr Nelva Bush  . LUMBAR LAMINECTOMY WITH COFLEX 2 LEVEL N/A 07/30/2016   Procedure: Lumbar three- four Lumbar four - five Laminectomy and foraminotomy;  Surgeon: Kristeen Miss, MD;  Location: [facility];  Service: Neurosurgery;  Laterality: N/A;  L3-4 L4-5 Laminectomy with coflex  . lumbar nerve block      X 14; Dr Nelva Bush  . MIDDLE EAR SURGERY  1980   Teflon tack   . REFRACTIVE SURGERY  2005   SE Ophth  . SHOULDER SURGERY      2 on L ; 3 on R  . TM replacement  1981  . TONSILLECTOMY AND ADENOIDECTOMY    . TOTAL KNEE ARTHROPLASTY   201 & 2012    bilaterally;Dr Alusio  . TOTAL SHOULDER REPLACEMENT     L  Social History:  reports that he quit smoking about 37 years ago. he has never used smokeless tobacco. He reports that he drinks alcohol. He reports that he does not use drugs.   Allergies  Allergen Reactions  . Shellfish Allergy Anaphylaxis    ANGIOEDEMA  . Garlic Nausea And Vomiting  . Iron Other (See Comments)    hemacomatosis    Family History  Problem Relation Age of Onset  . Diabetes Maternal Aunt   . Depression Brother        suicide  . Diabetes Father   . Stroke Father 70       MI in hospital for CVA  . Heart failure Mother        valvular disease  . Heart disease Paternal Aunt         2 aunts; 1 had MI @ ?> 60  . Heart disease Paternal Uncle           . Breast cancer Maternal Grandmother   .  Colon cancer Neg Hx   . COPD Neg Hx   . Asthma Neg Hx       Prior to Admission medications   Medication Sig Start Date End Date Taking? Authorizing Provider  ALPRAZolam (XANAX) 0.5 MG tablet TAKE 1/2 TABLET BY MOUTH EVERY 8 TO 12 HOURS AS NEEDED Patient taking differently: Take 0.5 mg by mouth at bedtime as needed. TAKE 1/2 TABLET BY MOUTH EVERY 8 TO 12 HOURS AS NEEDED 09/25/17  Yes Colon Branch, MD  aspirin EC 81 MG tablet Take 81 mg by mouth at bedtime.   Yes [provider]  atorvastatin (LIPITOR) 20 MG tablet TAKE 1 TABLET DAILY AT 6 P.M. Patient taking differently: TAKE 20mg  DAILY AT 6 P.M. 12/10/16  Yes Roma Schanz R, DO  CALCIUM-VITAMIN D PO Take 1 tablet by mouth daily.    Yes [provider]  ELIQUIS 5 MG TABS tablet TAKE 1 TABLET TWICE A DAY Patient taking differently: TAKE 5 mg TABLET TWICE A DAY 10/03/17  Yes Carollee Herter, Yvonne R, DO  fenofibrate 160 MG tablet TAKE 1 TABLET(160 MG) BY MOUTH DAILY 10/03/17  Yes Lowne Chase, Yvonne R, DO  fluticasone (FLONASE) 50 MCG/ACT nasal spray Place 2 sprays into both nostrils daily. Patient taking differently: Place 2 sprays into both nostrils daily as needed.  01/14/17  Yes Saguier, Percell Miller, PA-C  folic acid (FOLVITE) 1 MG tablet Take 1 mg by mouth daily.     Yes [provider]  HYDROcodone-acetaminophen (NORCO) 10-325 MG per tablet Take 1 tablet by mouth every 4 (four) hours as needed for moderate pain or severe pain (pain).    Yes [provider]  levothyroxine (SYNTHROID, LEVOTHROID) 125 MCG tablet TAKE 1 TABLET(125 MCG) BY MOUTH DAILY 08/06/17  Yes Roma Schanz R, DO  methocarbamol (ROBAXIN) 500 MG tablet Take 1 tablet (500 mg total) by mouth every 6 (six) hours as needed for muscle spasms. 07/31/16  Yes Kristeen Miss, MD  methotrexate (RHEUMATREX) 2.5 MG tablet Take 20 mg by mouth every Monday.    Yes [provider]  multivitamin [facility]) per tablet Take 1 tablet by mouth daily.      Yes [provider]  ondansetron (ZOFRAN) 4 MG tablet Take 1 tablet (4 mg total) by mouth every 8 (eight) hours as needed for nausea or vomiting. 06/27/17  Yes Carollee Herter, Yvonne R, DO  pantoprazole (PROTONIX) 40 MG tablet Take 1 tablet (40 mg total) daily by mouth.  08/15/17  Yes Roma Schanz R, DO  pioglitazone (ACTOS) 15 MG tablet TAKE 1 TABLET DAILY Patient taking differently: TAKE 15mg  by mouth once DAILY 02/27/17  Yes Roma Schanz R, DO  vitamin B-12 (CYANOCOBALAMIN) 1000 MCG tablet Take 1,000 mcg by mouth daily.   Yes [provider]    Physical Exam: BP 126/65   Pulse 65   Temp 98.5 F (36.9 C) (Oral)   Resp 11   Ht 6' (1.829 m)   Wt 100.7 kg (222 lb)   SpO2 99%   BMI 30.11 kg/m   General: Alert, awake, oriented, not in acute distress Eyes: Normal ENT: Normal Neck: Normal Cardiovascular: S1-S2 present, no added heart sounds Respiratory: Chest clear bilaterally Abdomen: Soft, tender to palpation, mildly distended, bowel sounds hyperactive Skin: Normal Musculoskeletal: Trace pedal edema on the left lower extremity, none on the right lower extremity Psychiatric: Normal mood Neurologic: Normal strength in all extremities, no focal neurologic deficit          Labs on Admission:  Basic Metabolic Panel: Recent Labs  Lab 10/13/17 1506  NA 137  K 3.7  CL 104  CO2 24  GLUCOSE 129*  BUN 18  CREATININE 1.72*  CALCIUM 8.8*   Liver Function Tests: Recent Labs  Lab 10/13/17 1506  AST 22  ALT 18  ALKPHOS 47  BILITOT 0.8  PROT 6.5  ALBUMIN 4.1   Recent Labs  Lab 10/13/17 1506  LIPASE 23   No results for input(s): AMMONIA in the last 168 hours. CBC: Recent Labs  Lab 10/13/17 1506  WBC 5.7  NEUTROABS 4.3  HGB 11.2*  HCT 35.3*  MCV 94.4  PLT 292   Cardiac Enzymes: No results for input(s): CKTOTAL, CKMB, CKMBINDEX, TROPONINI in the last 168 hours.  BNP (last 3 results) Recent Labs    06/18/17 1753  BNP 64.8     ProBNP (last 3 results) No results for input(s): PROBNP in the last 8760 hours.  CBG: No results for input(s): GLUCAP in the last 168 hours.  Radiological Exams on Admission: Ct Abdomen Pelvis W Contrast  Result Date: 10/13/2017 CLINICAL DATA:  Right abdomen pain since Thursday. EXAM: CT ABDOMEN AND PELVIS WITH CONTRAST TECHNIQUE: Multidetector CT imaging of the abdomen and pelvis was performed using the standard protocol following bolus administration of intravenous contrast. CONTRAST:  7mL ISOVUE-300 IOPAMIDOL (ISOVUE-300) INJECTION 61% COMPARISON:  August 17, 2014 FINDINGS: Lower chest: No acute abnormality. Hepatobiliary: 1.6 cm low-density mass is identified in the right lobe liver unchanged compared to prior exam. No other focal liver lesion is identified. Gallbladder is normal. The biliary tree is normal. Pancreas: Unremarkable. No pancreatic ductal dilatation or surrounding inflammatory changes. Spleen: Normal in size without focal abnormality. Adrenals/Urinary Tract: Vascular calcification is identified in the right renal hilum. Small cysts is identified in the midpole right kidney. No hydronephrosis is identified bilaterally. There is a 8 mm stone in the distal right ureter but no significant obstructive changes are noted. The bladder is normal. Stomach/Bowel: There is a small hiatal hernia. The stomach is otherwise normal. There are several dilated small bowel loops in the upper and mid abdomen. The distal small bowel loops are normal in caliber. The colon is normal in caliber. Patient status post prior appendectomy. Vascular/Lymphatic: Aortic atherosclerosis. No enlarged abdominal or pelvic lymph nodes. Reproductive: Prostate calcifications are noted. Other: None. Musculoskeletal: Degenerative joint changes of the spine are identified. IMPRESSION: Several dilated small bowel loops in the upper and mid abdomen. The  findings are either due to ileus or developing early bowel obstruction.  Follow-up is recommended. 8 mm stone in the distal right ureter but no significant obstructive changes are identified in the right collecting system. Electronically Signed   By: Abelardo Diesel M.D.   On: 10/13/2017 16:52   Dg Abd Portable 1 View  Result Date: 10/13/2017 CLINICAL DATA:  NG tube placement EXAM: PORTABLE ABDOMEN - 1 VIEW COMPARISON:  CT abdomen pelvis 10/13/2017 FINDINGS: The tip of the nasogastric tube is at the gastroesophageal junction or within the first portion of the duodenum. The side port is in the stomach. Visualized lungs are clear. IMPRESSION: NG tube tip at the gastroesophageal junction or just distal to it. Side port is in the stomach. Electronically Signed   By: Ulyses Jarred M.D.   On: 10/13/2017 18:33    EKG: Independently reviewed.  Assessment/Plan Present on Admission: . Small bowel obstruction ([place]) . Hypothyroidism . Hyperlipidemia LDL goal <70 . Essential hypertension . Spinal stenosis of lumbar region . DM (diabetes mellitus) type II, controlled, with peripheral vascular disorder ([place]) . History of pulmonary embolism . Chronic renal impairment, stage 3 (moderate) (HCC)  Principal Problem:   Small bowel obstruction (HCC) Active Problems:   Hypothyroidism   Hyperlipidemia LDL goal <70   Essential hypertension   Spinal stenosis of lumbar region   HEMOCHROMATOSIS   DM (diabetes mellitus) type II, controlled, with peripheral vascular disorder (HCC)   History of pulmonary embolism   Chronic renal impairment, stage 3 (moderate) (HCC)  Likely partial small bowel obstruction Abdominal pain, nausea Afebrile, no leukocytosis CT abdomen shows several dilated small bowel loops in the upper and mid abdomen.  Likely ileus or developing early bowel obstruction LFTs, lipase, lactic acid all within normal limits,  NG tube placed, IV fluids N.p.o except for meds with sips of water Continue IV fentanyl, IV Zofran General surgery consulted  Recurrent  PE Patient has a history of chronic thromboembolism, lupus anticoagulant positive Chronic bilateral PE, chronic right lower extremity DVT Continue Eliquis Follows an outpatient hematologist  Type 2 diabetes mellitus Last A1c, 7 Sensitive SSI, CBG every 6 hours, hypoglycemic protocol Hold home pioglitazone  CKD stage III Stable, creatinine at baseline Avoid nephrotoxics, renally dose all meds  Hyperlipidemia Held home Lipitor  GERD IV pantoprazole  Hypothyroidism Continue Synthroid  Rheumatoid arthritis Continue methotrexate every Monday  Hemochromatosis Continue outpatient follow-up with hematologist    DVT prophylaxis: Continue Eliquis  Code Status: Full  Family Communication: Discussed with wife extensively at bedside  Disposition Plan: Home once stable  Consults called: General surgery  Admission status: Observation    Alma Friendly MD [facility]   If 7PM-7AM, please contact night-coverage [email or web address] Password [facility]  10/13/2017, 6:38 PM

## 2017-10-13 NOTE — Consult Note (Signed)
Surgical Consultation Requesting provider: Dr. Horris Latino  CC: abdominal pain  HPI: 82yo man with multiple comorbidities as listed below but including diabetes and hypercoagulability/ hx of PE on lifelong Eliquis presents with 4 days of right upper abdominal pain, nausea and anorexia, no bowel movement in 3 days. The pain is colicky in nature, radiates all over abdomen and feels "gassy".  Prior appendectomy in high school but no other abdominal surgeries. He reports a bowel obstruction about 15 years ago which resolved with NG. Last dose of eliquis was yesterday evening.  He is retired, former Health and safety inspector at [employer]. He is eager to get home for his 82th birthday celebrations Wednesday.    Allergies  Allergen Reactions  . Shellfish Allergy Anaphylaxis    ANGIOEDEMA  . Garlic Nausea And Vomiting  . Iron Other (See Comments)    hemacomatosis    Past Medical History:  Diagnosis Date  . Anemia   . Arthritis    Dr Amil Amen, [place] rx- Rheumatology  . Diabetes mellitus   . Diverticulosis   . GERD (gastroesophageal reflux disease)   . Hemochromatosis     Dr Henrene Pastor  . History of kidney stones 1980   passed spont.  Marland Kitchen Hx of adenomatous colonic polyps   . Hyperlipidemia   . Hypertension   . Hypothyroidism   . IBS (irritable bowel syndrome)   . Internal hemorrhoids   . Kidney stones, calcium oxalate    1985 & 2005  . Pulmonary embolism ([place]) 2015   hosp. with Heparin & then on Xarelto- 3 months or so  . Reflux esophagitis   . SBO (small bowel obstruction) ([place]) 2004  . Spinal stenosis    Dr.Ramos  . Testosterone deficiency    111 in 2007    Past Surgical History:  Procedure Laterality Date  . APPENDECTOMY    . cataract  2005   bilaterally  . COLONOSCOPY W/ POLYPECTOMY  2005   tubular adenoma  . JOINT REPLACEMENT     L- shoulder, x2, replacement on both knees   . L4-5 & L5- S1 bilat facet injections  01/20/14    Dr Nelva Bush  . LUMBAR LAMINECTOMY WITH COFLEX 2 LEVEL N/A  07/30/2016   Procedure: Lumbar three- four Lumbar four - five Laminectomy and foraminotomy;  Surgeon: Kristeen Miss, MD;  Location: [facility];  Service: Neurosurgery;  Laterality: N/A;  L3-4 L4-5 Laminectomy with coflex  . lumbar nerve block      X 14; Dr Nelva Bush  . MIDDLE EAR SURGERY  1980   Teflon tack   . REFRACTIVE SURGERY  2005   SE Ophth  . SHOULDER SURGERY      2 on L ; 3 on R  . TM replacement  1981  . TONSILLECTOMY AND ADENOIDECTOMY    . TOTAL KNEE ARTHROPLASTY   201 & 2012    bilaterally;Dr Alusio  . TOTAL SHOULDER REPLACEMENT     L    Family History  Problem Relation Age of Onset  . Diabetes Maternal Aunt   . Depression Brother        suicide  . Diabetes Father   . Stroke Father 62       MI in hospital for CVA  . Heart failure Mother        valvular disease  . Heart disease Paternal Aunt         2 aunts; 1 had MI @ ?> 28  . Heart disease Paternal Uncle           .  Breast cancer Maternal Grandmother   . Colon cancer Neg Hx   . COPD Neg Hx   . Asthma Neg Hx     Social History   Socioeconomic History  . Marital status: Married    Spouse name: None  . Number of children: None  . Years of education: None  . Highest education level: None  Social Needs  . Financial resource strain: None  . Food insecurity - worry: None  . Food insecurity - inability: None  . Transportation needs - medical: None  . Transportation needs - non-medical: None  Occupational History  . None  Tobacco Use  . Smoking status: Former Smoker    Last attempt to quit: 10/01/1980    Years since quitting: 37.0  . Smokeless tobacco: Never Used  Substance and Sexual Activity  . Alcohol use: Yes    Alcohol/week: 0.0 oz    Comment: rare   . Drug use: No  . Sexual activity: Not Currently  Other Topics Concern  . None  Social History Narrative   Daily caffeine     No current facility-administered medications on file prior to encounter.    Current Outpatient Medications on File Prior to  Encounter  Medication Sig Dispense Refill  . ALPRAZolam (XANAX) 0.5 MG tablet TAKE 1/2 TABLET BY MOUTH EVERY 8 TO 12 HOURS AS NEEDED (Patient taking differently: Take 0.5 mg by mouth at bedtime as needed. TAKE 1/2 TABLET BY MOUTH EVERY 8 TO 12 HOURS AS NEEDED) 30 tablet 0  . aspirin EC 81 MG tablet Take 81 mg by mouth at bedtime.    Marland Kitchen atorvastatin (LIPITOR) 20 MG tablet TAKE 1 TABLET DAILY AT 6 P.M. (Patient taking differently: TAKE 20mg  DAILY AT 6 P.M.) 90 tablet 3  . CALCIUM-VITAMIN D PO Take 1 tablet by mouth daily.     Marland Kitchen ELIQUIS 5 MG TABS tablet TAKE 1 TABLET TWICE A DAY (Patient taking differently: TAKE 5 mg TABLET TWICE A DAY) 180 tablet 0  . fenofibrate 160 MG tablet TAKE 1 TABLET(160 MG) BY MOUTH DAILY 30 tablet 0  . fluticasone (FLONASE) 50 MCG/ACT nasal spray Place 2 sprays into both nostrils daily. (Patient taking differently: Place 2 sprays into both nostrils daily as needed. ) 16 g 1  . folic acid (FOLVITE) 1 MG tablet Take 1 mg by mouth daily.      Marland Kitchen HYDROcodone-acetaminophen (NORCO) 10-325 MG per tablet Take 1 tablet by mouth every 4 (four) hours as needed for moderate pain or severe pain (pain).     Marland Kitchen levothyroxine (SYNTHROID, LEVOTHROID) 125 MCG tablet TAKE 1 TABLET(125 MCG) BY MOUTH DAILY 90 tablet 0  . methocarbamol (ROBAXIN) 500 MG tablet Take 1 tablet (500 mg total) by mouth every 6 (six) hours as needed for muscle spasms. 40 tablet 3  . methotrexate (RHEUMATREX) 2.5 MG tablet Take 20 mg by mouth every Monday.     . multivitamin (THERAGRAN) per tablet Take 1 tablet by mouth daily.      . ondansetron (ZOFRAN) 4 MG tablet Take 1 tablet (4 mg total) by mouth every 8 (eight) hours as needed for nausea or vomiting. 20 tablet 0  . pantoprazole (PROTONIX) 40 MG tablet Take 1 tablet (40 mg total) daily by mouth. 90 tablet 1  . pioglitazone (ACTOS) 15 MG tablet TAKE 1 TABLET DAILY (Patient taking differently: TAKE 15mg  by mouth once DAILY) 90 tablet 3  . vitamin B-12 (CYANOCOBALAMIN)  1000 MCG tablet Take 1,000 mcg by mouth daily.    Marland Kitchen  budesonide-formoterol (SYMBICORT) 80-4.5 MCG/ACT inhaler Inhale 2 puffs into the lungs 2 (two) times daily. (Patient not taking: Reported on 10/13/2017) 1 Inhaler 0  . levocetirizine (XYZAL) 5 MG tablet Take 1 tablet (5 mg total) by mouth every evening. (Patient not taking: Reported on 10/13/2017) 30 tablet 5    Review of Systems: a complete, 10pt review of systems was completed with pertinent positives and negatives as documented in the HPI  Physical Exam: Vitals:   10/13/17 1700 10/13/17 1730  BP: 134/70 126/65  Pulse: 77 65  Resp: (!) 29 11  Temp:    SpO2: 93% 99%   Gen: A&Ox3, no distress  Head: normocephalic, atraumatic, EOMI, anicteric.  Neck: supple without mass or thyromegaly Chest: unlabored respirations, symmetrical air entry   Cardiovascular: RRR with palpable distal pulses, no pedal edema Abdomen: soft, mildly distended, minimally tender in upper fields. No mass or organomegaly.  Extremities: warm, without edema, no deformities  Neuro: grossly intact Psych: appropriate mood and affect  Skin: warm and dry   CBC Latest Ref Rng & Units 10/13/2017 09/16/2017 07/15/2017  WBC 4.0 - 10.5 K/uL 5.7 4.6 3.8(L)  Hemoglobin 13.0 - 17.0 g/dL 11.2(L) 11.3(L) 11.5(L)  Hematocrit 39.0 - 52.0 % 35.3(L) 34.9(L) 36.2(L)  Platelets 150 - 400 K/uL 292 271 314    CMP Latest Ref Rng & Units 10/13/2017 09/16/2017 07/15/2017  Glucose 65 - 99 mg/dL 129(H) 148(H) 119(H)  BUN 6 - 20 mg/dL 18 20 18   Creatinine 0.61 - 1.24 mg/dL 1.72(H) 1.7(H) 1.9(H)  Sodium 135 - 145 mmol/L 137 148(H) 145  Potassium 3.5 - 5.1 mmol/L 3.7 4.6 4.3  Chloride 101 - 111 mmol/L 104 107 105  CO2 22 - 32 mmol/L 24 29 28   Calcium 8.9 - 10.3 mg/dL 8.8(L) 10.1 9.3  Total Protein 6.5 - 8.1 g/dL 6.5 6.8 6.7  Total Bilirubin 0.3 - 1.2 mg/dL 0.8 0.60 0.50  Alkaline Phos 38 - 126 U/L 47 62 60  AST 15 - 41 U/L 22 19 26   ALT 17 - 63 U/L 18 29 21     Lab Results   Component Value Date   INR 1.02 06/18/2017   INR 1.00 08/17/2014   INR 0.99 06/11/2011    Imaging: Ct Abdomen Pelvis W Contrast  Result Date: 10/13/2017 CLINICAL DATA:  Right abdomen pain since Thursday. EXAM: CT ABDOMEN AND PELVIS WITH CONTRAST TECHNIQUE: Multidetector CT imaging of the abdomen and pelvis was performed using the standard protocol following bolus administration of intravenous contrast. CONTRAST:  51mL ISOVUE-300 IOPAMIDOL (ISOVUE-300) INJECTION 61% COMPARISON:  August 17, 2014 FINDINGS: Lower chest: No acute abnormality. Hepatobiliary: 1.6 cm low-density mass is identified in the right lobe liver unchanged compared to prior exam. No other focal liver lesion is identified. Gallbladder is normal. The biliary tree is normal. Pancreas: Unremarkable. No pancreatic ductal dilatation or surrounding inflammatory changes. Spleen: Normal in size without focal abnormality. Adrenals/Urinary Tract: Vascular calcification is identified in the right renal hilum. Small cysts is identified in the midpole right kidney. No hydronephrosis is identified bilaterally. There is a 8 mm stone in the distal right ureter but no significant obstructive changes are noted. The bladder is normal. Stomach/Bowel: There is a small hiatal hernia. The stomach is otherwise normal. There are several dilated small bowel loops in the upper and mid abdomen. The distal small bowel loops are normal in caliber. The colon is normal in caliber. Patient status post prior appendectomy. Vascular/Lymphatic: Aortic atherosclerosis. No enlarged abdominal or pelvic lymph nodes. Reproductive: Prostate  calcifications are noted. Other: None. Musculoskeletal: Degenerative joint changes of the spine are identified. IMPRESSION: Several dilated small bowel loops in the upper and mid abdomen. The findings are either due to ileus or developing early bowel obstruction. Follow-up is recommended. 8 mm stone in the distal right ureter but no  significant obstructive changes are identified in the right collecting system. Electronically Signed   By: Abelardo Diesel M.D.   On: 10/13/2017 16:52    A/P: 82yo gentleman with partial small bowel obstruction.  -NG decompression, NPO, IV fluid resuscitation, pain/nausea control; minimize narcotics as able -Hold eliquis, ok for heparin drip at discretion of primary team -Small bowel protocol to start tomorrow  Will continue to follow, thank you for the consult     Romana Juniper, MD [name], [place] Pager [phone]

## 2017-10-13 NOTE — ED Notes (Signed)
Report attempted 

## 2017-10-13 NOTE — ED Provider Notes (Signed)
[facility] EMERGENCY DEPARTMENT Provider Note   CSN: [id number] Arrival date & time: 10/13/17  1450     History   Chief Complaint Chief Complaint  Patient presents with  . Abdominal Pain    HPI Louis Mondry. is a 82 y.o. male.  HPI Louis Thain. is a 82 y.o. male  With hx of DM, anemia, hemochromatosis, HTN, PE, presents to ED with complaint of abdominal pain, nausea, loss of appetite.  Patient states pain started 4 days ago.  Pain is mainly in the right upper quadrant.  Radiates all over upper abdomen.  Reports associated nausea and some "regurgitation into my mouth."  He reports that he feels "gassy."  He is unsure if his abdomen is distended.  He states he has not had a bowel movement in 3 days.  He reports no appetite and has been eating or drinking much.  He is unsure if eating makes his pain worse.  He states his pain comes and goes and at this time is minimal.  He received Zofran by EMS.  He does not feel nauseated at this time.  He denies any urinary symptoms.  No diarrhea.  No blood in his stool or emesis.  He denies any fever or chills.  Past Medical History:  Diagnosis Date  . Anemia   . Arthritis    Dr Amil Amen, [facility] rx- Rheumatology  . Diabetes mellitus   . Diverticulosis   . GERD (gastroesophageal reflux disease)   . Hemochromatosis     Dr Henrene Pastor  . History of kidney stones 1980   passed spont.  Marland Kitchen Hx of adenomatous colonic polyps   . Hyperlipidemia   . Hypertension   . Hypothyroidism   . IBS (irritable bowel syndrome)   . Internal hemorrhoids   . Kidney stones, calcium oxalate    1985 & 2005  . Pulmonary embolism ([place]) 2015   hosp. with Heparin & then on Xarelto- 3 months or so  . Reflux esophagitis   . SBO (small bowel obstruction) ([place]) 2004  . Spinal stenosis    Dr.Ramos  . Testosterone deficiency    111 in 2007    Patient Active Problem List   Diagnosis Date Noted  . Recurrent pulmonary embolism ([place]) 06/27/2017  .  Chronic renal impairment, stage 3 (moderate) ([place]) 06/27/2017  . Nausea 06/27/2017  . Deep vein thrombosis (DVT) of right lower extremity ([place]) 06/27/2017  . CKD (chronic kidney disease) stage 3, GFR 30-59 ml/min ([facility]) 06/19/2017  . SOB (shortness of breath) 06/18/2017  . Pulmonary embolism ([place]) 06/18/2017  . Hemochromatosis 05/21/2017  . URI (upper respiratory infection) 05/21/2017  . Lumbar stenosis with neurogenic claudication 07/30/2016  . CN (constipation)   . History of pulmonary embolism 08/17/2014  . Abdominal pain 08/17/2014  . Nephrolithiasis 10/09/2013  . Gastroesophageal reflux disease 05/23/2012  . Unspecified vitamin D deficiency 03/25/2011  . DM (diabetes mellitus) type II, controlled, with peripheral vascular disorder ([place]) 10/09/2010  . Anxiety state 10/09/2010  . DIVERTICULOSIS-COLON 10/09/2010  . Hypothyroidism 07/07/2009  . DEGENERATIVE JOINT DISEASE 07/07/2009  . Essential hypertension 05/27/2008  . Hyperlipidemia LDL goal <70 10/16/2007  . TESTOSTERONE DEFICIENCY 05/14/2007  . Anemia, chronic disease 02/18/2007  . REFLUX ESOPHAGITIS 02/18/2007  . Spinal stenosis of lumbar region 02/18/2007  . HEMOCHROMATOSIS 02/18/2007    Past Surgical History:  Procedure Laterality Date  . APPENDECTOMY    . cataract  2005   bilaterally  . COLONOSCOPY W/ POLYPECTOMY  2005  tubular adenoma  . JOINT REPLACEMENT     L- shoulder, x2, replacement on both knees   . L4-5 & L5- S1 bilat facet injections  01/20/14    Dr Nelva Bush  . LUMBAR LAMINECTOMY WITH COFLEX 2 LEVEL N/A 07/30/2016   Procedure: Lumbar three- four Lumbar four - five Laminectomy and foraminotomy;  Surgeon: Kristeen Miss, MD;  Location: [facility];  Service: Neurosurgery;  Laterality: N/A;  L3-4 L4-5 Laminectomy with coflex  . lumbar nerve block      X 14; Dr Nelva Bush  . MIDDLE EAR SURGERY  1980   Teflon tack   . REFRACTIVE SURGERY  2005   SE Ophth  . SHOULDER SURGERY      2 on L ; 3 on R  . TM replacement  1981    . TONSILLECTOMY AND ADENOIDECTOMY    . TOTAL KNEE ARTHROPLASTY   201 & 2012    bilaterally;Dr Alusio  . TOTAL SHOULDER REPLACEMENT     L       Home Medications    Prior to Admission medications   Medication Sig Start Date End Date Taking? Authorizing Provider  ALPRAZolam Duanne Moron) 0.5 MG tablet TAKE 1/2 TABLET BY MOUTH EVERY 8 TO 12 HOURS AS NEEDED 09/25/17   Colon Branch, MD  amoxicillin (AMOXIL) 500 MG capsule Take 500 mg by mouth as needed. 06/26/17   [provider]  aspirin EC 81 MG tablet Take 81 mg by mouth at bedtime.    [provider]  atorvastatin (LIPITOR) 20 MG tablet TAKE 1 TABLET DAILY AT 6 P.M. Patient taking differently: TAKE 20mg  DAILY AT 6 P.M. 12/10/16   Carollee Herter, Alferd Apa, DO  budesonide-formoterol (SYMBICORT) 80-4.5 MCG/ACT inhaler Inhale 2 puffs into the lungs 2 (two) times daily. 07/01/17   Roma Schanz R, DO  CALCIUM-VITAMIN D PO Take 1 tablet by mouth daily.     [provider]  ELIQUIS 5 MG TABS tablet TAKE 1 TABLET TWICE A DAY 10/03/17   Carollee Herter, Yvonne R, DO  fenofibrate 160 MG tablet TAKE 1 TABLET(160 MG) BY MOUTH DAILY 10/03/17   Carollee Herter, Alferd Apa, DO  fluticasone (FLONASE) 50 MCG/ACT nasal spray Place 2 sprays into both nostrils daily. 01/14/17   Saguier, Percell Miller, PA-C  folic acid (FOLVITE) 1 MG tablet Take 1 mg by mouth daily.      [provider]  HYDROcodone-acetaminophen (NORCO) 10-325 MG per tablet Take 1 tablet by mouth every 4 (four) hours as needed for moderate pain or severe pain (pain).     [provider]  levocetirizine (XYZAL) 5 MG tablet Take 1 tablet (5 mg total) by mouth every evening. 05/21/17   Carollee Herter, Alferd Apa, DO  levothyroxine (SYNTHROID, LEVOTHROID) 125 MCG tablet TAKE 1 TABLET(125 MCG) BY MOUTH DAILY 08/06/17   Carollee Herter, Alferd Apa, DO  methocarbamol (ROBAXIN) 500 MG tablet Take 1 tablet (500 mg total) by mouth every 6 (six) hours as needed for muscle spasms. 07/31/16   Kristeen Miss, MD  methotrexate (RHEUMATREX) 2.5 MG tablet Take 20 mg by mouth every Monday.     [provider]  multivitamin [facility]) per tablet Take 1 tablet by mouth daily.      [provider]  ondansetron (ZOFRAN) 4 MG tablet Take 1 tablet (4 mg total) by mouth every 8 (eight) hours as needed for nausea or vomiting. 06/27/17   Carollee Herter, Alferd Apa, DO  pantoprazole (PROTONIX) 40 MG tablet Take 1 tablet (40  mg total) daily by mouth. 08/15/17   Carollee Herter, Alferd Apa, DO  pioglitazone (ACTOS) 15 MG tablet TAKE 1 TABLET DAILY Patient taking differently: TAKE 15mg  by mouth once DAILY 02/27/17   Carollee Herter, Alferd Apa, DO  [facility] injection ADM 0.5ML IM UTD 08/06/17   [provider]  vitamin B-12 (CYANOCOBALAMIN) 1000 MCG tablet Take 1,000 mcg by mouth daily.    [provider]    Family History Family History  Problem Relation Age of Onset  . Diabetes Maternal Aunt   . Depression Brother        suicide  . Diabetes Father   . Stroke Father 62       MI in hospital for CVA  . Heart failure Mother        valvular disease  . Heart disease Paternal Aunt         2 aunts; 1 had MI @ ?> 21  . Heart disease Paternal Uncle           . Breast cancer Maternal Grandmother   . Colon cancer Neg Hx   . COPD Neg Hx   . Asthma Neg Hx     Social History Social History   Tobacco Use  . Smoking status: Former Smoker    Last attempt to quit: 10/01/1980    Years since quitting: 37.0  . Smokeless tobacco: Never Used  Substance Use Topics  . Alcohol use: Yes    Alcohol/week: 0.0 oz    Comment: rare   . Drug use: No     Allergies   Shellfish allergy; Garlic; and Iron   Review of Systems Review of Systems  Constitutional: Negative for chills and fever.  Respiratory: Negative for cough, chest tightness and shortness of breath.   Cardiovascular: Negative for chest pain, palpitations and leg swelling.  Gastrointestinal: Positive for abdominal pain, nausea and  vomiting. Negative for abdominal distention and diarrhea.  Genitourinary: Negative for dysuria, frequency, hematuria and urgency.  Musculoskeletal: Negative for arthralgias, myalgias, neck pain and neck stiffness.  Skin: Negative for rash.  Allergic/Immunologic: Negative for immunocompromised state.  Neurological: Negative for dizziness, weakness, light-headedness, numbness and headaches.  All other systems reviewed and are negative.    Physical Exam Updated Vital Signs BP (!) 142/72   Pulse 65   Temp 98.5 F (36.9 C) (Oral)   Resp 14   Ht 6' (1.829 m)   Wt 100.7 kg (222 lb)   SpO2 97%   BMI 30.11 kg/m   Physical Exam  Constitutional: He appears well-developed and well-nourished. No distress.  HENT:  Head: Normocephalic and atraumatic.  Eyes: Conjunctivae are normal.  Neck: Neck supple.  Cardiovascular: Normal rate, regular rhythm and normal heart sounds.  Pulmonary/Chest: Effort normal. No respiratory distress. He has no wheezes. He has no rales.  Abdominal: There is no rebound.  Decreased bowel sounds, abdomen feels firm, distended, right upper quadrant tenderness.  Musculoskeletal: He exhibits no edema.  Neurological: He is alert.  Skin: Skin is warm and dry.  Nursing note and vitals reviewed.    ED Treatments / Results  Labs (all labs ordered are listed, but only abnormal results are displayed) Labs Reviewed  CBC WITH DIFFERENTIAL/PLATELET - Abnormal; Notable for the following components:      Result Value   RBC 3.74 (*)    Hemoglobin 11.2 (*)    HCT 35.3 (*)    RDW 16.7 (*)    All other components within normal limits  COMPREHENSIVE METABOLIC PANEL -  Abnormal; Notable for the following components:   Glucose, Bld 129 (*)    Creatinine, Ser 1.72 (*)    Calcium 8.8 (*)    GFR calc non Af Amer 36 (*)    GFR calc Af Amer 42 (*)    All other components within normal limits  LIPASE, BLOOD  I-STAT CG4 LACTIC ACID, ED    EKG  EKG Interpretation None        Radiology Ct Abdomen Pelvis W Contrast  Result Date: 10/13/2017 CLINICAL DATA:  Right abdomen pain since Thursday. EXAM: CT ABDOMEN AND PELVIS WITH CONTRAST TECHNIQUE: Multidetector CT imaging of the abdomen and pelvis was performed using the standard protocol following bolus administration of intravenous contrast. CONTRAST:  26mL ISOVUE-300 IOPAMIDOL (ISOVUE-300) INJECTION 61% COMPARISON:  August 17, 2014 FINDINGS: Lower chest: No acute abnormality. Hepatobiliary: 1.6 cm low-density mass is identified in the right lobe liver unchanged compared to prior exam. No other focal liver lesion is identified. Gallbladder is normal. The biliary tree is normal. Pancreas: Unremarkable. No pancreatic ductal dilatation or surrounding inflammatory changes. Spleen: Normal in size without focal abnormality. Adrenals/Urinary Tract: Vascular calcification is identified in the right renal hilum. Small cysts is identified in the midpole right kidney. No hydronephrosis is identified bilaterally. There is a 8 mm stone in the distal right ureter but no significant obstructive changes are noted. The bladder is normal. Stomach/Bowel: There is a small hiatal hernia. The stomach is otherwise normal. There are several dilated small bowel loops in the upper and mid abdomen. The distal small bowel loops are normal in caliber. The colon is normal in caliber. Patient status post prior appendectomy. Vascular/Lymphatic: Aortic atherosclerosis. No enlarged abdominal or pelvic lymph nodes. Reproductive: Prostate calcifications are noted. Other: None. Musculoskeletal: Degenerative joint changes of the spine are identified. IMPRESSION: Several dilated small bowel loops in the upper and mid abdomen. The findings are either due to ileus or developing early bowel obstruction. Follow-up is recommended. 8 mm stone in the distal right ureter but no significant obstructive changes are identified in the right collecting system. Electronically Signed    By: Abelardo Diesel M.D.   On: 10/13/2017 16:52    Procedures Procedures (including critical care time)  Medications Ordered in ED Medications  sodium chloride 0.9 % bolus 500 mL (not administered)     Initial Impression / Assessment and Plan / ED Course  I have reviewed the triage vital signs and the nursing notes.  Pertinent labs & imaging results that were available during my care of the patient were reviewed by me and considered in my medical decision making (see chart for details).     Patient in emergency department with right upper quadrant abdominal pain, nausea, possibly some vomiting.  No bowel movement in several days.  He reports history of "intestines twisting" and states he was treated with NG tube, no surgical intervention.  Question whether he could have recurrent bowel obstruction given distention, nausea, vomiting, no bowel movement.  Patient's pain is in the right upper quadrant, differential includes gallbladder disease, liver disease, colitis.  Will check labs and CT abdomen and pelvis.  Pt's CT scan showing ileus vs early small bowel obstruction. Pt continues to have nausea, uanble to drink. Given hx of the same, most likely SBO. Discussed with medicine, will admit. NG tube placed.   5:58 PM Spoke with Dr. Kae Heller with surgery, will consult.   Vitals:   10/13/17 1700 10/13/17 1730  BP: 134/70 126/65  Pulse: 77 65  Resp: (!) 29 11  Temp:    SpO2: 93% 99%     Final Clinical Impressions(s) / ED Diagnoses   Final diagnoses:  Small bowel obstruction [facility])    ED Discharge Orders    None       Jeannett Senior, PA-C 10/14/17 0034    Davonna Belling, MD 10/14/17 0040

## 2017-10-13 NOTE — Progress Notes (Signed)
Louis [name]. is a 82 y.o. male patient admitted from ED awake, alert - oriented  X 4 - no acute distress noted.  VSS - Blood pressure (!) 133/57, pulse 86, temperature 99.1 F (37.3 C), temperature source Oral, resp. rate 20, height 6' (1.829 m), weight 100.7 kg (222 lb), SpO2 96 %.    IV in place, occlusive dsg intact without redness.   Will cont to eval and treat per MD orders.  Vidal Schwalbe, RN 10/13/2017 2015

## 2017-10-13 NOTE — ED Triage Notes (Signed)
Per EMS: Pt c/o of RUQ abd pain x 4 days. Pt states pain radiates across abd.   Pt has nausea but no vomiting. VSS

## 2017-10-14 ENCOUNTER — Observation Stay (HOSPITAL_COMMUNITY): Payer: Medicare Other

## 2017-10-14 DIAGNOSIS — K922 Gastrointestinal hemorrhage, unspecified: Secondary | ICD-10-CM | POA: Diagnosis not present

## 2017-10-14 DIAGNOSIS — Z86711 Personal history of pulmonary embolism: Secondary | ICD-10-CM | POA: Diagnosis not present

## 2017-10-14 DIAGNOSIS — Z87442 Personal history of urinary calculi: Secondary | ICD-10-CM | POA: Diagnosis not present

## 2017-10-14 DIAGNOSIS — K56609 Unspecified intestinal obstruction, unspecified as to partial versus complete obstruction: Secondary | ICD-10-CM | POA: Diagnosis not present

## 2017-10-14 DIAGNOSIS — E039 Hypothyroidism, unspecified: Secondary | ICD-10-CM | POA: Diagnosis not present

## 2017-10-14 DIAGNOSIS — E785 Hyperlipidemia, unspecified: Secondary | ICD-10-CM | POA: Diagnosis not present

## 2017-10-14 DIAGNOSIS — R1011 Right upper quadrant pain: Secondary | ICD-10-CM | POA: Diagnosis not present

## 2017-10-14 DIAGNOSIS — Z7951 Long term (current) use of inhaled steroids: Secondary | ICD-10-CM | POA: Diagnosis not present

## 2017-10-14 DIAGNOSIS — I10 Essential (primary) hypertension: Secondary | ICD-10-CM | POA: Diagnosis not present

## 2017-10-14 DIAGNOSIS — Z96653 Presence of artificial knee joint, bilateral: Secondary | ICD-10-CM | POA: Diagnosis present

## 2017-10-14 DIAGNOSIS — M48061 Spinal stenosis, lumbar region without neurogenic claudication: Secondary | ICD-10-CM | POA: Diagnosis not present

## 2017-10-14 DIAGNOSIS — K219 Gastro-esophageal reflux disease without esophagitis: Secondary | ICD-10-CM | POA: Diagnosis present

## 2017-10-14 DIAGNOSIS — Z833 Family history of diabetes mellitus: Secondary | ICD-10-CM | POA: Diagnosis not present

## 2017-10-14 DIAGNOSIS — K210 Gastro-esophageal reflux disease with esophagitis: Secondary | ICD-10-CM | POA: Diagnosis present

## 2017-10-14 DIAGNOSIS — Z96612 Presence of left artificial shoulder joint: Secondary | ICD-10-CM | POA: Diagnosis present

## 2017-10-14 DIAGNOSIS — Z79890 Hormone replacement therapy (postmenopausal): Secondary | ICD-10-CM | POA: Diagnosis not present

## 2017-10-14 DIAGNOSIS — E1122 Type 2 diabetes mellitus with diabetic chronic kidney disease: Secondary | ICD-10-CM | POA: Diagnosis present

## 2017-10-14 DIAGNOSIS — E1151 Type 2 diabetes mellitus with diabetic peripheral angiopathy without gangrene: Secondary | ICD-10-CM | POA: Diagnosis not present

## 2017-10-14 DIAGNOSIS — Z86010 Personal history of colonic polyps: Secondary | ICD-10-CM | POA: Diagnosis not present

## 2017-10-14 DIAGNOSIS — I129 Hypertensive chronic kidney disease with stage 1 through stage 4 chronic kidney disease, or unspecified chronic kidney disease: Secondary | ICD-10-CM | POA: Diagnosis present

## 2017-10-14 DIAGNOSIS — I82501 Chronic embolism and thrombosis of unspecified deep veins of right lower extremity: Secondary | ICD-10-CM | POA: Diagnosis present

## 2017-10-14 DIAGNOSIS — Z7982 Long term (current) use of aspirin: Secondary | ICD-10-CM | POA: Diagnosis not present

## 2017-10-14 DIAGNOSIS — Z7901 Long term (current) use of anticoagulants: Secondary | ICD-10-CM | POA: Diagnosis not present

## 2017-10-14 DIAGNOSIS — Z818 Family history of other mental and behavioral disorders: Secondary | ICD-10-CM | POA: Diagnosis not present

## 2017-10-14 DIAGNOSIS — M1990 Unspecified osteoarthritis, unspecified site: Secondary | ICD-10-CM | POA: Diagnosis present

## 2017-10-14 DIAGNOSIS — K56600 Partial intestinal obstruction, unspecified as to cause: Secondary | ICD-10-CM | POA: Diagnosis present

## 2017-10-14 DIAGNOSIS — N183 Chronic kidney disease, stage 3 (moderate): Secondary | ICD-10-CM | POA: Diagnosis not present

## 2017-10-14 DIAGNOSIS — K5791 Diverticulosis of intestine, part unspecified, without perforation or abscess with bleeding: Secondary | ICD-10-CM | POA: Diagnosis present

## 2017-10-14 LAB — CBC
HEMATOCRIT: 32.3 % — AB (ref 39.0–52.0)
HEMOGLOBIN: 10.4 g/dL — AB (ref 13.0–17.0)
MCH: 30.3 pg (ref 26.0–34.0)
MCHC: 32.2 g/dL (ref 30.0–36.0)
MCV: 94.2 fL (ref 78.0–100.0)
Platelets: 240 10*3/uL (ref 150–400)
RBC: 3.43 MIL/uL — ABNORMAL LOW (ref 4.22–5.81)
RDW: 16.9 % — ABNORMAL HIGH (ref 11.5–15.5)
WBC: 4.2 10*3/uL (ref 4.0–10.5)

## 2017-10-14 LAB — APTT: aPTT: 81 seconds — ABNORMAL HIGH (ref 24–36)

## 2017-10-14 LAB — COMPREHENSIVE METABOLIC PANEL
ALBUMIN: 3.5 g/dL (ref 3.5–5.0)
ALT: 15 U/L — ABNORMAL LOW (ref 17–63)
ANION GAP: 9 (ref 5–15)
AST: 18 U/L (ref 15–41)
Alkaline Phosphatase: 40 U/L (ref 38–126)
BUN: 16 mg/dL (ref 6–20)
CO2: 22 mmol/L (ref 22–32)
Calcium: 8.5 mg/dL — ABNORMAL LOW (ref 8.9–10.3)
Chloride: 108 mmol/L (ref 101–111)
Creatinine, Ser: 1.63 mg/dL — ABNORMAL HIGH (ref 0.61–1.24)
GFR calc Af Amer: 45 mL/min — ABNORMAL LOW (ref >60)
GFR calc non Af Amer: 38 mL/min — ABNORMAL LOW (ref >60)
GLUCOSE: 107 mg/dL — AB (ref 65–99)
POTASSIUM: 3.6 mmol/L (ref 3.5–5.1)
SODIUM: 139 mmol/L (ref 135–145)
Total Bilirubin: 0.9 mg/dL (ref 0.3–1.2)
Total Protein: 5.8 g/dL — ABNORMAL LOW (ref 6.5–8.1)

## 2017-10-14 LAB — GLUCOSE, CAPILLARY
Glucose-Capillary: 112 mg/dL — ABNORMAL HIGH (ref 65–99)
Glucose-Capillary: 116 mg/dL — ABNORMAL HIGH (ref 65–99)
Glucose-Capillary: 94 mg/dL (ref 65–99)
Glucose-Capillary: 99 mg/dL (ref 65–99)

## 2017-10-14 LAB — HEPARIN LEVEL (UNFRACTIONATED): HEPARIN UNFRACTIONATED: 1.22 [IU]/mL — AB (ref 0.30–0.70)

## 2017-10-14 MED ORDER — INSULIN ASPART 100 UNIT/ML ~~LOC~~ SOLN
0.0000 [IU] | Freq: Four times a day (QID) | SUBCUTANEOUS | Status: DC
  Administered 2017-10-15: 1 [IU] via SUBCUTANEOUS

## 2017-10-14 MED ORDER — DIATRIZOATE MEGLUMINE & SODIUM 66-10 % PO SOLN
90.0000 mL | Freq: Once | ORAL | Status: AC
  Administered 2017-10-14: 90 mL via NASOGASTRIC
  Filled 2017-10-14: qty 90

## 2017-10-14 MED ORDER — HEPARIN BOLUS VIA INFUSION
3000.0000 [IU] | Freq: Once | INTRAVENOUS | Status: AC
  Administered 2017-10-14: 3000 [IU] via INTRAVENOUS
  Filled 2017-10-14: qty 3000

## 2017-10-14 MED ORDER — ALPRAZOLAM 0.5 MG PO TABS
0.5000 mg | ORAL_TABLET | Freq: Every evening | ORAL | Status: DC | PRN
  Administered 2017-10-14 – 2017-10-15 (×2): 0.5 mg via ORAL
  Filled 2017-10-14 (×3): qty 1

## 2017-10-14 MED ORDER — HEPARIN (PORCINE) IN NACL 100-0.45 UNIT/ML-% IJ SOLN
1500.0000 [IU]/h | INTRAMUSCULAR | Status: AC
  Administered 2017-10-14 – 2017-10-16 (×4): 1500 [IU]/h via INTRAVENOUS
  Filled 2017-10-14 (×6): qty 250

## 2017-10-14 NOTE — Progress Notes (Signed)
[facility] Progress Note     Subjective: CC: abdominal pain Patient has some mild abdominal pain in right abdomen this AM, improving from yesterday. Had some nausea this AM, but none currently. Just had watery BM, got contrast around 0900.  UOP good. VSS.   Objective: Vital signs in last 24 hours: Temp:  [98.5 F (36.9 C)-99.1 F (37.3 C)] 98.6 F (37 C) (01/14 0635) Pulse Rate:  [62-86] 64 (01/14 0635) Resp:  [11-29] 16 (01/14 0635) BP: (120-142)/(42-72) 137/42 (01/14 0635) SpO2:  [93 %-100 %] 100 % (01/14 0635) Weight:  [100.7 kg (222 lb)] 100.7 kg (222 lb) (01/13 1452) Last BM Date: 10/10/17  Intake/Output from previous day: 01/13 0701 - 01/14 0700 In: 851.7 [P.O.:90; I.V.:261.7; IV Piggyback:500] Out: 700 [Urine:200; Emesis/NG output:500] Intake/Output this shift: No intake/output data recorded.  PE: Gen:  Alert, NAD, pleasant Card:  Regular rate and rhythm, pedal pulses 2+ BL Pulm:  Normal effort, clear to auscultation bilaterally Abd: Soft, minimally tender to right of umbilicus, not distended, +BS, no HSM Skin: warm and dry, no rashes  Psych: A&Ox3   Lab Results:  Recent Labs    10/13/17 1506 10/14/17 0623  WBC 5.7 4.2  HGB 11.2* 10.4*  HCT 35.3* 32.3*  PLT 292 240   BMET Recent Labs    10/13/17 1506 10/14/17 0623  NA 137 139  K 3.7 3.6  CL 104 108  CO2 24 22  GLUCOSE 129* 107*  BUN 18 16  CREATININE 1.72* 1.63*  CALCIUM 8.8* 8.5*   PT/INR No results for input(s): LABPROT, INR in the last 72 hours. CMP     Component Value Date/Time   NA 139 10/14/2017 0623   NA 148 (H) 09/16/2017 1051   K 3.6 10/14/2017 0623   K 4.6 09/16/2017 1051   CL 108 10/14/2017 0623   CL 107 09/16/2017 1051   CO2 22 10/14/2017 0623   CO2 29 09/16/2017 1051   GLUCOSE 107 (H) 10/14/2017 0623   GLUCOSE 148 (H) 09/16/2017 1051   BUN 16 10/14/2017 0623   BUN 20 09/16/2017 1051   CREATININE 1.63 (H) 10/14/2017 0623   CREATININE 1.7 (H) 09/16/2017  1051   CALCIUM 8.5 (L) 10/14/2017 0623   CALCIUM 10.1 09/16/2017 1051   PROT 5.8 (L) 10/14/2017 0623   PROT 6.8 09/16/2017 1051   ALBUMIN 3.5 10/14/2017 0623   ALBUMIN 3.8 09/16/2017 1051   AST 18 10/14/2017 0623   AST 19 09/16/2017 1051   ALT 15 (L) 10/14/2017 0623   ALT 29 09/16/2017 1051   ALKPHOS 40 10/14/2017 0623   ALKPHOS 62 09/16/2017 1051   BILITOT 0.9 10/14/2017 0623   BILITOT 0.60 09/16/2017 1051   GFRNONAA 38 (L) 10/14/2017 0623   GFRAA 45 (L) 10/14/2017 0623   Lipase     Component Value Date/Time   LIPASE 23 10/13/2017 1506       Studies/Results: Ct Abdomen Pelvis W Contrast  Result Date: 10/13/2017 CLINICAL DATA:  Right abdomen pain since Thursday. EXAM: CT ABDOMEN AND PELVIS WITH CONTRAST TECHNIQUE: Multidetector CT imaging of the abdomen and pelvis was performed using the standard protocol following bolus administration of intravenous contrast. CONTRAST:  32mL ISOVUE-300 IOPAMIDOL (ISOVUE-300) INJECTION 61% COMPARISON:  August 17, 2014 FINDINGS: Lower chest: No acute abnormality. Hepatobiliary: 1.6 cm low-density mass is identified in the right lobe liver unchanged compared to prior exam. No other focal liver lesion is identified. Gallbladder is normal. The biliary tree is normal. Pancreas: Unremarkable. No pancreatic ductal  dilatation or surrounding inflammatory changes. Spleen: Normal in size without focal abnormality. Adrenals/Urinary Tract: Vascular calcification is identified in the right renal hilum. Small cysts is identified in the midpole right kidney. No hydronephrosis is identified bilaterally. There is a 8 mm stone in the distal right ureter but no significant obstructive changes are noted. The bladder is normal. Stomach/Bowel: There is a small hiatal hernia. The stomach is otherwise normal. There are several dilated small bowel loops in the upper and mid abdomen. The distal small bowel loops are normal in caliber. The colon is normal in caliber. Patient  status post prior appendectomy. Vascular/Lymphatic: Aortic atherosclerosis. No enlarged abdominal or pelvic lymph nodes. Reproductive: Prostate calcifications are noted. Other: None. Musculoskeletal: Degenerative joint changes of the spine are identified. IMPRESSION: Several dilated small bowel loops in the upper and mid abdomen. The findings are either due to ileus or developing early bowel obstruction. Follow-up is recommended. 8 mm stone in the distal right ureter but no significant obstructive changes are identified in the right collecting system. Electronically Signed   By: Abelardo Diesel M.D.   On: 10/13/2017 16:52   Dg Abd Portable 1 View  Result Date: 10/13/2017 CLINICAL DATA:  NG tube placement EXAM: PORTABLE ABDOMEN - 1 VIEW COMPARISON:  CT abdomen pelvis 10/13/2017 FINDINGS: The tip of the nasogastric tube is at the gastroesophageal junction or within the first portion of the duodenum. The side port is in the stomach. Visualized lungs are clear. IMPRESSION: NG tube tip at the gastroesophageal junction or just distal to it. Side port is in the stomach. Electronically Signed   By: Ulyses Jarred M.D.   On: 10/13/2017 18:33    Anti-infectives: Anti-infectives (From admission, onward)   None       Assessment/Plan Recurrent PE T2DM CKD stage III HLD GERD Hypothyroidism RA Hemochromatosis  PSBO - initiate SB protocol today - NGT with 500 cc bilious out in 24h, just had large BM - allow clears around NGT - if tolerating will pull NGT later today  FEN: allow clears around NGT, IVF; NGT clamping trial  VTE: SCDs, heparin gtt ID: no current abx, afeb, WBC 4.2   LOS: 0 days    Brigid Re , [facility] 10/14/2017, 10:08 AM Pager: [phone] Consults: [phone] Mon-Fri 7:00 am-4:30 pm Sat-Sun 7:00 am-11:30 am

## 2017-10-14 NOTE — Progress Notes (Addendum)
ANTICOAGULATION CONSULT NOTE Pharmacy Consult:  Heparin Indication:  History of PE and hypercoagulable state  Allergies  Allergen Reactions  . Shellfish Allergy Anaphylaxis    ANGIOEDEMA  . Garlic Nausea And Vomiting  . Iron Other (See Comments)    hemacomatosis    Patient Measurements: Height: 6' (182.9 cm) Weight: 222 lb (100.7 kg) IBW/kg (Calculated) : 77.6 Heparin Dosing Weight: 98 kg  Vital Signs: Temp: 98.4 F (36.9 C) (01/14 1405) Temp Source: Oral (01/14 1405) BP: 146/53 (01/14 1405) Pulse Rate: 85 (01/14 1405)  Labs: Recent Labs    10/13/17 1506 10/14/17 0623 10/14/17 1502  HGB 11.2* 10.4*  --   HCT 35.3* 32.3*  --   PLT 292 240  --   APTT  --   --  81*  CREATININE 1.72* 1.63*  --     Estimated Creatinine Clearance: 45.1 mL/min (A) (by C-G formula based on SCr of 1.63 mg/dL (H)).  Assessment: 89 YOM presented right right upper abdominal pain, nausea and anorexia.  Patient has a history of PE and hypercoagulable state on Eliquis PTA (last dose 1/12 at 2100).  Pharmacy consulted to transition patient to IV heparin.  He has baseline renal dysfunction.  No bleeding reported.  PTT = 81 seconds, therapeutic Heparin level elevated as expected due to Eliquis  Goal of Therapy:  Heparin level 0.3-0.7 units/ml aPTT 66 - 102 seconds Monitor platelets by anticoagulation protocol: Yes    Plan:  Continue heparin at 1500 units / hr Follow up AM heparin level, PTT, CBC  Thank you Anette Guarneri, PharmD [phone] 10/14/2017, 3:45 PM

## 2017-10-14 NOTE — Progress Notes (Signed)
PROGRESS NOTE  Louis [name]. OJJ:[id number] DOB: 1935-12-21 DOA: 10/13/2017 PCP: Ann Held, DO  HPI/Recap of past 24 hours: Louis [name]. is a 82 y.o. male with medical history significant for recurrent PE on lifelong anticoagulant, diabetes type 2, hyperlipidemia, hemochromatosis, rheumatoid arthritis presents to the ED complaining of abdominal pain, nausea and dry heaving for the past 4 days.  Reports abdominal pain is worse on the right side and moves across his lower abdomen, describes it as twisting in nature, associated with nausea and dry heaving but denies vomiting.  No bowel movement in 3 days. Patient denies any chest pain, shortness of breath, cough, fever/chills, diarrhea.  In the ED, patient remained stable CT abdomen and pelvis showed partial small bowel obstruction.  Of note, patient reports having a small bowel obstruction in the past last episode was around 15 years ago.  Patient has a history of appendectomy about 50 years ago.  Patient has not been able to keep any solid food down due to extreme nausea.  Patient admitted for further management. In the ED, CT showed partial small bowel obstruction, patient was given IV fluids, antiemetics, IV fentanyl and an NG tube was placed.  General surgery was consulted  Today, patient reported resolved abdominal pain, passing gas and also had a large bowel movement.  Abdomen still mildly distended.  No other new complaints   Assessment/Plan: Principal Problem:   Small bowel obstruction (HCC) Active Problems:   Hypothyroidism   Hyperlipidemia LDL goal <70   Essential hypertension   Spinal stenosis of lumbar region   HEMOCHROMATOSIS   DM (diabetes mellitus) type II, controlled, with peripheral vascular disorder (HCC)   History of pulmonary embolism   Chronic renal impairment, stage 3 (moderate) (HCC)   Likely partial small bowel obstruction Resolving, had a large BM Afebrile, no leukocytosis CT abdomen  shows several dilated small bowel loops in the upper and mid abdomen. Likely ileus or developing early bowel obstruction LFTs, lipase, lactic acid all within normal limits NG tube placed, IV fluids Started on clears by Gen surg Continue IV fentanyl, IV Zofran General surgery on board  Recurrent PE Patient has a history of chronic thromboembolism, lupus anticoagulant positive Chronic bilateral PE, chronic right lower extremity DVT Held eliquis, started heparin gtt Follows an outpatient hematologist  Type 2 diabetes mellitus Last A1c, 7 Sensitive SSI, CBG every 6 hours, hypoglycemic protocol Hold home pioglitazone  CKD stage III Stable, creatinine at baseline Avoid nephrotoxics, renally dose all meds  Hyperlipidemia Held home Lipitor  GERD IV pantoprazole  Hypothyroidism Continue Synthroid  Rheumatoid arthritis Continue methotrexate every Monday  Hemochromatosis Continue outpatient follow-up with hematologist    Code Status: Full  Family Communication: Wife at bedside  Disposition Plan: Home once stable   Consultants:  General surgery  Procedures: None  Antimicrobials: None  DVT prophylaxis: Heparin   Objective: Vitals:   10/13/17 1930 10/13/17 1958 10/14/17 0635 10/14/17 1405  BP: 129/65 (!) 133/57 (!) 137/42 (!) 146/53  Pulse: 67 86 64 85  Resp: 15 20 16 18   Temp:  99.1 F (37.3 C) 98.6 F (37 C) 98.4 F (36.9 C)  TempSrc:  Oral Oral Oral  SpO2: 95% 96% 100% 100%  Weight:      Height:        Intake/Output Summary (Last 24 hours) at 10/14/2017 1749 Last data filed at 10/14/2017 1407 Gross per 24 hour  Intake 711.67 ml  Output 1000 ml  Net -288.33 ml  Filed Weights   10/13/17 1452  Weight: 100.7 kg (222 lb)    Exam:   General: Alert, awake, oriented, not in acute distress  Cardiovascular: S1-S2 present, no added heart sounds  Respiratory: Chest clear bilaterally  Abdomen: Soft, mild tenderness on RLQ, mildly  distended, bowel sounds present  Musculoskeletal: Trace pedal edema on LLL, none on the RLE  Skin: Normal  Psychiatry: Normal mood   Data Reviewed: CBC: Recent Labs  Lab 10/13/17 1506 10/14/17 0623  WBC 5.7 4.2  NEUTROABS 4.3  --   HGB 11.2* 10.4*  HCT 35.3* 32.3*  MCV 94.4 94.2  PLT 292 277   Basic Metabolic Panel: Recent Labs  Lab 10/13/17 1506 10/14/17 0623  NA 137 139  K 3.7 3.6  CL 104 108  CO2 24 22  GLUCOSE 129* 107*  BUN 18 16  CREATININE 1.72* 1.63*  CALCIUM 8.8* 8.5*   GFR: Estimated Creatinine Clearance: 45.1 mL/min (A) (by C-G formula based on SCr of 1.63 mg/dL (H)). Liver Function Tests: Recent Labs  Lab 10/13/17 1506 10/14/17 0623  AST 22 18  ALT 18 15*  ALKPHOS 47 40  BILITOT 0.8 0.9  PROT 6.5 5.8*  ALBUMIN 4.1 3.5   Recent Labs  Lab 10/13/17 1506  LIPASE 23   No results for input(s): AMMONIA in the last 168 hours. Coagulation Profile: No results for input(s): INR, PROTIME in the last 168 hours. Cardiac Enzymes: No results for input(s): CKTOTAL, CKMB, CKMBINDEX, TROPONINI in the last 168 hours. BNP (last 3 results) No results for input(s): PROBNP in the last 8760 hours. HbA1C: No results for input(s): HGBA1C in the last 72 hours. CBG: Recent Labs  Lab 10/14/17 0017 10/14/17 0633 10/14/17 1155  GLUCAP 94 99 116*   Lipid Profile: No results for input(s): CHOL, HDL, LDLCALC, TRIG, CHOLHDL, LDLDIRECT in the last 72 hours. Thyroid Function Tests: No results for input(s): TSH, T4TOTAL, FREET4, T3FREE, THYROIDAB in the last 72 hours. Anemia Panel: No results for input(s): VITAMINB12, FOLATE, FERRITIN, TIBC, IRON, RETICCTPCT in the last 72 hours. Urine analysis:    Component Value Date/Time   COLORURINE YELLOW 06/18/2017 1752   APPEARANCEUR CLEAR 06/18/2017 1752   LABSPEC 1.010 06/18/2017 1752   PHURINE 7.0 06/18/2017 1752   GLUCOSEU NEGATIVE 06/18/2017 1752   HGBUR TRACE (A) 06/18/2017 1752   HGBUR large 08/28/2010 1412    BILIRUBINUR NEGATIVE 06/18/2017 1752   BILIRUBINUR neg 10/09/2013 1637   KETONESUR NEGATIVE 06/18/2017 1752   PROTEINUR NEGATIVE 06/18/2017 1752   UROBILINOGEN 0.2 10/09/2013 1637   UROBILINOGEN 0.2 06/11/2011 1317   NITRITE NEGATIVE 06/18/2017 1752   LEUKOCYTESUR NEGATIVE 06/18/2017 1752   Sepsis Labs: @LABRCNTIP (procalcitonin:4,lacticidven:4)  )No results found for this or any previous visit (from the past 240 hour(s)).    Studies: Dg Abd Portable 1 View  Result Date: 10/13/2017 CLINICAL DATA:  NG tube placement EXAM: PORTABLE ABDOMEN - 1 VIEW COMPARISON:  CT abdomen pelvis 10/13/2017 FINDINGS: The tip of the nasogastric tube is at the gastroesophageal junction or within the first portion of the duodenum. The side port is in the stomach. Visualized lungs are clear. IMPRESSION: NG tube tip at the gastroesophageal junction or just distal to it. Side port is in the stomach. Electronically Signed   By: Ulyses Jarred M.D.   On: 10/13/2017 18:33    Scheduled Meds: . insulin aspart  0-9 Units Subcutaneous Q6H  . levothyroxine  125 mcg Oral QAC breakfast  . methotrexate  20 mg Oral Q Mon  .  pantoprazole (PROTONIX) IV  40 mg Intravenous Q24H  . sodium chloride flush  3 mL Intravenous Q12H    Continuous Infusions: . sodium chloride 100 mL/hr at 10/14/17 0637  . sodium chloride    . heparin 1,500 Units/hr (10/14/17 1053)     LOS: 0 days     Alma Friendly, MD [facility]   If 7PM-7AM, please contact night-coverage [email or web address] Password [facility] 10/14/2017, 5:49 PM

## 2017-10-14 NOTE — Progress Notes (Signed)
ANTICOAGULATION CONSULT NOTE - Initial Consult  Pharmacy Consult:  Heparin Indication:  History of PE and hypercoagulable state  Allergies  Allergen Reactions  . Shellfish Allergy Anaphylaxis    ANGIOEDEMA  . Garlic Nausea And Vomiting  . Iron Other (See Comments)    hemacomatosis    Patient Measurements: Height: 6' (182.9 cm) Weight: 222 lb (100.7 kg) IBW/kg (Calculated) : 77.6 Heparin Dosing Weight: 98 kg  Vital Signs: Temp: 98.6 F (37 C) (01/14 0635) Temp Source: Oral (01/14 0635) BP: 137/42 (01/14 0635) Pulse Rate: 64 (01/14 0635)  Labs: Recent Labs    10/13/17 1506 10/14/17 0623  HGB 11.2* 10.4*  HCT 35.3* 32.3*  PLT 292 240  CREATININE 1.72* 1.63*    Estimated Creatinine Clearance: 45.1 mL/min (A) (by C-G formula based on SCr of 1.63 mg/dL (H)).   Medical History: Past Medical History:  Diagnosis Date  . Anemia   . Arthritis    Dr Amil Amen, [place] rx- Rheumatology  . Diabetes mellitus   . Diverticulosis   . GERD (gastroesophageal reflux disease)   . Hemochromatosis     Dr Henrene Pastor  . History of kidney stones 1980   passed spont.  Marland Kitchen Hx of adenomatous colonic polyps   . Hyperlipidemia   . Hypertension   . Hypothyroidism   . IBS (irritable bowel syndrome)   . Internal hemorrhoids   . Kidney stones, calcium oxalate    1985 & 2005  . Pulmonary embolism ([place]) 2015   hosp. with Heparin & then on Xarelto- 3 months or so  . Reflux esophagitis   . SBO (small bowel obstruction) ([place]) 2004  . Spinal stenosis    Dr.Ramos  . Testosterone deficiency    111 in 2007      Assessment: 25 YOM presented right right upper abdominal pain, nausea and anorexia.  Patient has a history of PE and hypercoagulable state on Eliquis PTA (last dose 1/12 at 2100).  Pharmacy consulted to transition patient to IV heparin.  He has baseline renal dysfunction.  No bleeding reported.   Goal of Therapy:  Heparin level 0.3-0.7 units/ml aPTT 66 - 102 seconds Monitor platelets by  anticoagulation protocol: Yes    Plan:  Heparin 3000 units IV bolus x 1, then Heparin gtt at 1500 units/hr Check 6 hr aPTT, heparin level Daily heparin level, aPTT and CBC   Natausha Jungwirth D. Mina Marble, PharmD, BCPS Pager:  [phone] 10/14/2017, 8:47 AM

## 2017-10-15 DIAGNOSIS — K922 Gastrointestinal hemorrhage, unspecified: Secondary | ICD-10-CM

## 2017-10-15 LAB — BASIC METABOLIC PANEL
Anion gap: 9 (ref 5–15)
BUN: 10 mg/dL (ref 6–20)
CO2: 21 mmol/L — ABNORMAL LOW (ref 22–32)
Calcium: 8.5 mg/dL — ABNORMAL LOW (ref 8.9–10.3)
Chloride: 109 mmol/L (ref 101–111)
Creatinine, Ser: 1.46 mg/dL — ABNORMAL HIGH (ref 0.61–1.24)
GFR calc Af Amer: 51 mL/min — ABNORMAL LOW (ref >60)
GFR, EST NON AFRICAN AMERICAN: 44 mL/min — AB (ref >60)
Glucose, Bld: 109 mg/dL — ABNORMAL HIGH (ref 65–99)
POTASSIUM: 3.4 mmol/L — AB (ref 3.5–5.1)
Sodium: 139 mmol/L (ref 135–145)

## 2017-10-15 LAB — GLUCOSE, CAPILLARY
GLUCOSE-CAPILLARY: 88 mg/dL (ref 65–99)
Glucose-Capillary: 101 mg/dL — ABNORMAL HIGH (ref 65–99)
Glucose-Capillary: 102 mg/dL — ABNORMAL HIGH (ref 65–99)
Glucose-Capillary: 104 mg/dL — ABNORMAL HIGH (ref 65–99)
Glucose-Capillary: 122 mg/dL — ABNORMAL HIGH (ref 65–99)

## 2017-10-15 LAB — HEPARIN LEVEL (UNFRACTIONATED): Heparin Unfractionated: 0.77 IU/mL — ABNORMAL HIGH (ref 0.30–0.70)

## 2017-10-15 LAB — CBC
HCT: 31.3 % — ABNORMAL LOW (ref 39.0–52.0)
Hemoglobin: 9.8 g/dL — ABNORMAL LOW (ref 13.0–17.0)
MCH: 29.3 pg (ref 26.0–34.0)
MCHC: 31.3 g/dL (ref 30.0–36.0)
MCV: 93.7 fL (ref 78.0–100.0)
PLATELETS: 245 10*3/uL (ref 150–400)
RBC: 3.34 MIL/uL — AB (ref 4.22–5.81)
RDW: 16.7 % — AB (ref 11.5–15.5)
WBC: 5.5 10*3/uL (ref 4.0–10.5)

## 2017-10-15 LAB — APTT: APTT: 92 s — AB (ref 24–36)

## 2017-10-15 NOTE — Progress Notes (Signed)
Pt claimed that he had no bleeding in his stool since 7am, but he had bowel movement around 6 am with smear of blood, MD aware.

## 2017-10-15 NOTE — Progress Notes (Signed)
ANTICOAGULATION CONSULT NOTE  Pharmacy Consult:  Heparin Indication:  History of PE and hypercoagulable state  Allergies  Allergen Reactions  . Shellfish Allergy Anaphylaxis    ANGIOEDEMA  . Garlic Nausea And Vomiting  . Iron Other (See Comments)    hemacomatosis    Patient Measurements: Height: 6' (182.9 cm) Weight: 222 lb (100.7 kg) IBW/kg (Calculated) : 77.6 Heparin Dosing Weight: 98 kg  Vital Signs: Temp: 98.3 F (36.8 C) (01/15 1250) Temp Source: Oral (01/15 1250) BP: 120/54 (01/15 1250) Pulse Rate: 52 (01/15 1250)  Labs: Recent Labs    10/13/17 1506 10/14/17 0623 10/14/17 1502 10/15/17 0749  HGB 11.2* 10.4*  --  9.8*  HCT 35.3* 32.3*  --  31.3*  PLT 292 240  --  245  APTT  --   --  81* 92*  HEPARINUNFRC  --   --  1.22* 0.77*  CREATININE 1.72* 1.63*  --  1.46*    Estimated Creatinine Clearance: 50.4 mL/min (A) (by C-G formula based on SCr of 1.46 mg/dL (H)).    Assessment: 46 YOM presented with right upper abdominal pain, nausea and anorexia.  Patient has a history of PE and hypercoagulable state on Eliquis PTA (last dose 1/12 at 2100).  Currently on IV heparin and aPTT is therapeutic.  Per documentation, patient had bloody bowel movements yesterday and none today.  RN did not witness.   Goal of Therapy:  Heparin level 0.3-0.7 units/ml aPTT 66 - 102 seconds Monitor platelets by anticoagulation protocol: Yes    Plan:  Continue heparin gtt at 1500 units/hr Daily heparin level, aPTT and CBC MD to advise whether to continue IV heparin   Lucina Betty D. Mina Marble, PharmD, BCPS Pager:  [phone] 10/15/2017, 1:30 PM

## 2017-10-15 NOTE — Progress Notes (Signed)
PROGRESS NOTE  Louis [name]. DGL:[id number] DOB: [birth year] DOA: 10/13/2017 PCP: Ann Held, DO  HPI/Recap of past 24 hours: Louis [name]. is a 82 y.o. male with medical history significant for recurrent PE on lifelong anticoagulant, diabetes type 2, hyperlipidemia, hemochromatosis, rheumatoid arthritis presents to the ED complaining of abdominal pain, nausea and dry heaving for the past 4 days.  Reports abdominal pain is worse on the right side and moves across his lower abdomen, describes it as twisting in nature, associated with nausea and dry heaving but denies vomiting.  No bowel movement in 3 days. Patient denies any chest pain, shortness of breath, cough, fever/chills, diarrhea.  In the ED, patient remained stable CT abdomen and pelvis showed partial small bowel obstruction.  Of note, patient reports having a small bowel obstruction in the past last episode was around 15 years ago.  Patient has a history of appendectomy about 50 years ago.  Patient has not been able to keep any solid food down due to extreme nausea.  Patient admitted for further management. In the ED, CT showed partial small bowel obstruction, patient was given IV fluids, antiemetics, IV fentanyl and an NG tube was placed.  General surgery was consulted  Today, patient reported multiple bloody bowel movements with stool overnight and some early this morning.  Patient denies any dizziness, palpitations, shortness of breath, chest pain.  Reports mild abdominal pain, denies any nausea/vomiting.  Able to tolerate clear liquid diet.  Advance diet to soft.   Assessment/Plan: Principal Problem:   Small bowel obstruction (HCC) Active Problems:   Hypothyroidism   Hyperlipidemia LDL goal <70   Essential hypertension   Spinal stenosis of lumbar region   HEMOCHROMATOSIS   DM (diabetes mellitus) type II, controlled, with peripheral vascular disorder (HCC)   History of pulmonary embolism   Chronic renal  impairment, stage 3 (moderate) (HCC)   SBO (small bowel obstruction) (HCC)   Likely partial small bowel obstruction Resolving, had a large BM, some bloody Afebrile, no leukocytosis CT abdomen shows several dilated small bowel loops in the upper and mid abdomen. Likely ileus or developing early bowel obstruction Repeat Abd xray with resolving SBO LFTs, lipase, lactic acid all within normal limits NG tube removed, IV fluids Started on soft diet by Gen surg Continue IV fentanyl, IV Zofran General surgery on board Of note, tomorrow is patients 82th birthday and wants to be hopefully discharged if stable   ?? Lower GIB Multiple BM overnight and 1 episode early this am with BRB Denies dizziness, no huge hgb drop Hx of diverticulosis, likely due to bleeding as pt was significantly constipated Last colonoscopy in 2017 with tubular adenomas and severe diverticulosis Pt advised to take miralax daily which pt wasn't and was also taking norco 10mg  for shoulder pain May consider consulting GI if bleeding persists or sig hgb drop IVF, may continue heparin for now.  Recurrent PE Patient has a history of chronic thromboembolism, lupus anticoagulant positive Chronic bilateral PE, chronic right lower extremity DVT Held eliquis, started heparin gtt Follows an outpatient hematologist  Type 2 diabetes mellitus Last A1c, 7 Sensitive SSI, CBG every 6 hours, hypoglycemic protocol Hold home pioglitazone  CKD stage III Stable, creatinine at baseline Avoid nephrotoxics, renally dose all meds  Hyperlipidemia Held home Lipitor  GERD IV pantoprazole  Hypothyroidism Continue Synthroid  Rheumatoid arthritis Continue methotrexate every Monday  Hemochromatosis Continue outpatient follow-up with hematologist    Code Status: Full  Family Communication: Wife  at bedside  Disposition Plan: Home once stable   Consultants:  General  surgery  Procedures: None  Antimicrobials: None  DVT prophylaxis: Heparin   Objective: Vitals:   10/14/17 1405 10/14/17 2058 10/15/17 0553 10/15/17 1250  BP: (!) 146/53 (!) 127/56 (!) 125/53 (!) 120/54  Pulse: 85 63 60 (!) 52  Resp: 18 18 18 18   Temp: 98.4 F (36.9 C) 99.8 F (37.7 C) 98.6 F (37 C) 98.3 F (36.8 C)  TempSrc: Oral Oral Oral Oral  SpO2: 100% 96% 98% 100%  Weight:      Height:        Intake/Output Summary (Last 24 hours) at 10/15/2017 1758 Last data filed at 10/15/2017 1600 Gross per 24 hour  Intake 4129.75 ml  Output 300 ml  Net 3829.75 ml   Filed Weights   10/13/17 1452  Weight: 100.7 kg (222 lb)    Exam:   General: Alert, awake, oriented, not in acute distress  Cardiovascular: S1-S2 present, no added heart sounds  Respiratory: Chest clear bilaterally  Abdomen: Soft, mild tenderness on RLQ, mildly distended, bowel sounds present  Musculoskeletal: Trace pedal edema on LLL, none on the RLE  Skin: Normal  Psychiatry: Normal mood   Data Reviewed: CBC: Recent Labs  Lab 10/13/17 1506 10/14/17 0623 10/15/17 0749  WBC 5.7 4.2 5.5  NEUTROABS 4.3  --   --   HGB 11.2* 10.4* 9.8*  HCT 35.3* 32.3* 31.3*  MCV 94.4 94.2 93.7  PLT 292 240 458   Basic Metabolic Panel: Recent Labs  Lab 10/13/17 1506 10/14/17 0623 10/15/17 0749  NA 137 139 139  K 3.7 3.6 3.4*  CL 104 108 109  CO2 24 22 21*  GLUCOSE 129* 107* 109*  BUN 18 16 10   CREATININE 1.72* 1.63* 1.46*  CALCIUM 8.8* 8.5* 8.5*   GFR: Estimated Creatinine Clearance: 50.4 mL/min (A) (by C-G formula based on SCr of 1.46 mg/dL (H)). Liver Function Tests: Recent Labs  Lab 10/13/17 1506 10/14/17 0623  AST 22 18  ALT 18 15*  ALKPHOS 47 40  BILITOT 0.8 0.9  PROT 6.5 5.8*  ALBUMIN 4.1 3.5   Recent Labs  Lab 10/13/17 1506  LIPASE 23   No results for input(s): AMMONIA in the last 168 hours. Coagulation Profile: No results for input(s): INR, PROTIME in the last 168  hours. Cardiac Enzymes: No results for input(s): CKTOTAL, CKMB, CKMBINDEX, TROPONINI in the last 168 hours. BNP (last 3 results) No results for input(s): PROBNP in the last 8760 hours. HbA1C: No results for input(s): HGBA1C in the last 72 hours. CBG: Recent Labs  Lab 10/14/17 1155 10/14/17 1750 10/15/17 0020 10/15/17 0641 10/15/17 1249  GLUCAP 116* 112* 101* 104* 122*   Lipid Profile: No results for input(s): CHOL, HDL, LDLCALC, TRIG, CHOLHDL, LDLDIRECT in the last 72 hours. Thyroid Function Tests: No results for input(s): TSH, T4TOTAL, FREET4, T3FREE, THYROIDAB in the last 72 hours. Anemia Panel: No results for input(s): VITAMINB12, FOLATE, FERRITIN, TIBC, IRON, RETICCTPCT in the last 72 hours. Urine analysis:    Component Value Date/Time   COLORURINE YELLOW 06/18/2017 1752   APPEARANCEUR CLEAR 06/18/2017 1752   LABSPEC 1.010 06/18/2017 1752   PHURINE 7.0 06/18/2017 1752   GLUCOSEU NEGATIVE 06/18/2017 1752   HGBUR TRACE (A) 06/18/2017 1752   HGBUR large 08/28/2010 1412   BILIRUBINUR NEGATIVE 06/18/2017 1752   BILIRUBINUR neg 10/09/2013 [place] 06/18/2017 1752   PROTEINUR NEGATIVE 06/18/2017 1752   UROBILINOGEN 0.2 10/09/2013 1637  UROBILINOGEN 0.2 06/11/2011 1317   NITRITE NEGATIVE 06/18/2017 1752   LEUKOCYTESUR NEGATIVE 06/18/2017 1752   Sepsis Labs: @LABRCNTIP (procalcitonin:4,lacticidven:4)  )No results found for this or any previous visit (from the past 240 hour(s)).    Studies: Dg Abd Portable 1v-small Bowel Obstruction Protocol-initial, 8 Hr Delay  Result Date: 10/14/2017 CLINICAL DATA:  Small-bowel obstruction. EXAM: PORTABLE ABDOMEN - 1 VIEW COMPARISON:  CT abdomen and pelvis 10/13/2017 FINDINGS: There are a few dilated loops of bowel within the left lower quadrant of the abdomen measuring up to 3.1 cm. Significantly improved from 10/13/2017. No enteric contrast material is identified. IMPRESSION: 1. A few dilated loops of small bowel are  identified within the left lower quadrant of the abdomen. The small-bowel obstruction pattern is significantly improved from 10/13/2017. Electronically Signed   By: Kerby Moors M.D.   On: 10/14/2017 20:54    Scheduled Meds: . insulin aspart  0-9 Units Subcutaneous Q6H  . levothyroxine  125 mcg Oral QAC breakfast  . methotrexate  20 mg Oral Q Mon  . pantoprazole (PROTONIX) IV  40 mg Intravenous Q24H  . sodium chloride flush  3 mL Intravenous Q12H    Continuous Infusions: . sodium chloride 1 mL (10/15/17 1016)  . sodium chloride    . heparin 1,500 Units/hr (10/15/17 1613)     LOS: 1 day     Alma Friendly, MD [facility]   If 7PM-7AM, please contact night-coverage [email or web address] Password [facility] 10/15/2017, 5:58 PM

## 2017-10-15 NOTE — Progress Notes (Signed)
[facility] Progress Note     Subjective: CC: blood in stool Patient had some bloody BMs overnight, most recent was without blood. On heparin gtt. Denies dizziness, SOB, palpitations. Tolerating clears. Denies n/v. Abdominal pain is minimal.  UOP good. VSS.   Objective: Vital signs in last 24 hours: Temp:  [98.4 F (36.9 C)-99.8 F (37.7 C)] 98.6 F (37 C) (01/15 0553) Pulse Rate:  [60-85] 60 (01/15 0553) Resp:  [18] 18 (01/15 0553) BP: (125-146)/(53-56) 125/53 (01/15 0553) SpO2:  [96 %-100 %] 98 % (01/15 0553) Last BM Date: 10/15/17  Intake/Output from previous day: 01/14 0701 - 01/15 0700 In: 4116.8 [P.O.:730; I.V.:3386.8] Out: 600 [Urine:500; Emesis/NG output:100] Intake/Output this shift: Total I/O In: 3 [I.V.:3] Out: -   PE: Gen:  Alert, NAD, pleasant Card:  Regular rate and rhythm, pedal pulses 2+ BL Pulm:  Normal effort, clear to auscultation bilaterally Abd: Soft, minimally tender to right of umbilicus, not distended, +BS, no HSM Skin: warm and dry, no rashes  Psych: A&Ox3     Lab Results:  Recent Labs    10/14/17 0623 10/15/17 0749  WBC 4.2 5.5  HGB 10.4* 9.8*  HCT 32.3* 31.3*  PLT 240 245   BMET Recent Labs    10/14/17 0623 10/15/17 0749  NA 139 139  K 3.6 3.4*  CL 108 109  CO2 22 21*  GLUCOSE 107* 109*  BUN 16 10  CREATININE 1.63* 1.46*  CALCIUM 8.5* 8.5*   PT/INR No results for input(s): LABPROT, INR in the last 72 hours. CMP     Component Value Date/Time   NA 139 10/15/2017 0749   NA 148 (H) 09/16/2017 1051   K 3.4 (L) 10/15/2017 0749   K 4.6 09/16/2017 1051   CL 109 10/15/2017 0749   CL 107 09/16/2017 1051   CO2 21 (L) 10/15/2017 0749   CO2 29 09/16/2017 1051   GLUCOSE 109 (H) 10/15/2017 0749   GLUCOSE 148 (H) 09/16/2017 1051   BUN 10 10/15/2017 0749   BUN 20 09/16/2017 1051   CREATININE 1.46 (H) 10/15/2017 0749   CREATININE 1.7 (H) 09/16/2017 1051   CALCIUM 8.5 (L) 10/15/2017 0749   CALCIUM 10.1 09/16/2017  1051   PROT 5.8 (L) 10/14/2017 0623   PROT 6.8 09/16/2017 1051   ALBUMIN 3.5 10/14/2017 0623   ALBUMIN 3.8 09/16/2017 1051   AST 18 10/14/2017 0623   AST 19 09/16/2017 1051   ALT 15 (L) 10/14/2017 0623   ALT 29 09/16/2017 1051   ALKPHOS 40 10/14/2017 0623   ALKPHOS 62 09/16/2017 1051   BILITOT 0.9 10/14/2017 0623   BILITOT 0.60 09/16/2017 1051   GFRNONAA 44 (L) 10/15/2017 0749   GFRAA 51 (L) 10/15/2017 0749   Lipase     Component Value Date/Time   LIPASE 23 10/13/2017 1506       Studies/Results: Ct Abdomen Pelvis W Contrast  Result Date: 10/13/2017 CLINICAL DATA:  Right abdomen pain since Thursday. EXAM: CT ABDOMEN AND PELVIS WITH CONTRAST TECHNIQUE: Multidetector CT imaging of the abdomen and pelvis was performed using the standard protocol following bolus administration of intravenous contrast. CONTRAST:  75mL ISOVUE-300 IOPAMIDOL (ISOVUE-300) INJECTION 61% COMPARISON:  August 17, 2014 FINDINGS: Lower chest: No acute abnormality. Hepatobiliary: 1.6 cm low-density mass is identified in the right lobe liver unchanged compared to prior exam. No other focal liver lesion is identified. Gallbladder is normal. The biliary tree is normal. Pancreas: Unremarkable. No pancreatic ductal dilatation or surrounding inflammatory changes. Spleen: Normal in size  without focal abnormality. Adrenals/Urinary Tract: Vascular calcification is identified in the right renal hilum. Small cysts is identified in the midpole right kidney. No hydronephrosis is identified bilaterally. There is a 8 mm stone in the distal right ureter but no significant obstructive changes are noted. The bladder is normal. Stomach/Bowel: There is a small hiatal hernia. The stomach is otherwise normal. There are several dilated small bowel loops in the upper and mid abdomen. The distal small bowel loops are normal in caliber. The colon is normal in caliber. Patient status post prior appendectomy. Vascular/Lymphatic: Aortic  atherosclerosis. No enlarged abdominal or pelvic lymph nodes. Reproductive: Prostate calcifications are noted. Other: None. Musculoskeletal: Degenerative joint changes of the spine are identified. IMPRESSION: Several dilated small bowel loops in the upper and mid abdomen. The findings are either due to ileus or developing early bowel obstruction. Follow-up is recommended. 8 mm stone in the distal right ureter but no significant obstructive changes are identified in the right collecting system. Electronically Signed   By: Abelardo Diesel M.D.   On: 10/13/2017 16:52   Dg Abd Portable 1v-small Bowel Obstruction Protocol-initial, 8 Hr Delay  Result Date: 10/14/2017 CLINICAL DATA:  Small-bowel obstruction. EXAM: PORTABLE ABDOMEN - 1 VIEW COMPARISON:  CT abdomen and pelvis 10/13/2017 FINDINGS: There are a few dilated loops of bowel within the left lower quadrant of the abdomen measuring up to 3.1 cm. Significantly improved from 10/13/2017. No enteric contrast material is identified. IMPRESSION: 1. A few dilated loops of small bowel are identified within the left lower quadrant of the abdomen. The small-bowel obstruction pattern is significantly improved from 10/13/2017. Electronically Signed   By: Kerby Moors M.D.   On: 10/14/2017 20:54   Dg Abd Portable 1 View  Result Date: 10/13/2017 CLINICAL DATA:  NG tube placement EXAM: PORTABLE ABDOMEN - 1 VIEW COMPARISON:  CT abdomen pelvis 10/13/2017 FINDINGS: The tip of the nasogastric tube is at the gastroesophageal junction or within the first portion of the duodenum. The side port is in the stomach. Visualized lungs are clear. IMPRESSION: NG tube tip at the gastroesophageal junction or just distal to it. Side port is in the stomach. Electronically Signed   By: Ulyses Jarred M.D.   On: 10/13/2017 18:33    Anti-infectives: Anti-infectives (From admission, onward)   None       Assessment/Plan Recurrent PE T2DM CKD stage  III HLD GERD Hypothyroidism RA Hemochromatosis  PSBO - f/u KUB shows improvement in bowel gas pattern - patient tolerating clears - advance to fulls - mobilize as tolerated  Bloody stools - patient reported bloody stools, most recent was not bloody - Patient without dizziness, SOB, palpitations - Hgb 9.8, continue to monitor  FEN: FLD VTE: SCDs, heparin gtt ID: no current abx, afeb, WBC 4.2    LOS: 1 day    Brigid Re , [facility] 10/15/2017, 10:50 AM Pager: [phone] Consults: [phone] Mon-Fri 7:00 am-4:30 pm Sat-Sun 7:00 am-11:30 am

## 2017-10-15 NOTE — Progress Notes (Signed)
Pt has an episode of bloody stools this AM (Fresh blood per pt.).Pt on heparin drip. Rn not able to see as pt already flushed it. Raliegh Ip Schorr notified, said she will let morning Md know. Will continue to monitor pt.

## 2017-10-16 ENCOUNTER — Other Ambulatory Visit: Payer: Self-pay

## 2017-10-16 DIAGNOSIS — N183 Chronic kidney disease, stage 3 (moderate): Secondary | ICD-10-CM

## 2017-10-16 DIAGNOSIS — I10 Essential (primary) hypertension: Secondary | ICD-10-CM

## 2017-10-16 DIAGNOSIS — K56609 Unspecified intestinal obstruction, unspecified as to partial versus complete obstruction: Secondary | ICD-10-CM

## 2017-10-16 DIAGNOSIS — Z86711 Personal history of pulmonary embolism: Secondary | ICD-10-CM

## 2017-10-16 DIAGNOSIS — E1151 Type 2 diabetes mellitus with diabetic peripheral angiopathy without gangrene: Secondary | ICD-10-CM

## 2017-10-16 LAB — CBC
HCT: 32.3 % — ABNORMAL LOW (ref 39.0–52.0)
Hemoglobin: 10.3 g/dL — ABNORMAL LOW (ref 13.0–17.0)
MCH: 30 pg (ref 26.0–34.0)
MCHC: 31.9 g/dL (ref 30.0–36.0)
MCV: 94.2 fL (ref 78.0–100.0)
PLATELETS: 279 10*3/uL (ref 150–400)
RBC: 3.43 MIL/uL — ABNORMAL LOW (ref 4.22–5.81)
RDW: 16.9 % — AB (ref 11.5–15.5)
WBC: 6.8 10*3/uL (ref 4.0–10.5)

## 2017-10-16 LAB — BASIC METABOLIC PANEL
Anion gap: 9 (ref 5–15)
BUN: 8 mg/dL (ref 6–20)
CHLORIDE: 110 mmol/L (ref 101–111)
CO2: 20 mmol/L — AB (ref 22–32)
CREATININE: 1.51 mg/dL — AB (ref 0.61–1.24)
Calcium: 9.3 mg/dL (ref 8.9–10.3)
GFR calc Af Amer: 49 mL/min — ABNORMAL LOW (ref >60)
GFR calc non Af Amer: 42 mL/min — ABNORMAL LOW (ref >60)
GLUCOSE: 107 mg/dL — AB (ref 65–99)
Potassium: 3.7 mmol/L (ref 3.5–5.1)
Sodium: 139 mmol/L (ref 135–145)

## 2017-10-16 LAB — HEPARIN LEVEL (UNFRACTIONATED): Heparin Unfractionated: 0.66 IU/mL (ref 0.30–0.70)

## 2017-10-16 LAB — APTT: aPTT: 98 seconds — ABNORMAL HIGH (ref 24–36)

## 2017-10-16 LAB — GLUCOSE, CAPILLARY: Glucose-Capillary: 108 mg/dL — ABNORMAL HIGH (ref 65–99)

## 2017-10-16 MED ORDER — PANTOPRAZOLE SODIUM 40 MG PO TBEC
40.0000 mg | DELAYED_RELEASE_TABLET | Freq: Every day | ORAL | Status: DC
  Administered 2017-10-16: 40 mg via ORAL
  Filled 2017-10-16: qty 1

## 2017-10-16 MED ORDER — HYDROCODONE-ACETAMINOPHEN 10-325 MG PO TABS: 1.0000 | ORAL_TABLET | ORAL | 0 refills | Status: DC | PRN

## 2017-10-16 MED ORDER — APIXABAN 5 MG PO TABS
5.0000 mg | ORAL_TABLET | Freq: Two times a day (BID) | ORAL | Status: DC
  Administered 2017-10-16: 5 mg via ORAL
  Filled 2017-10-16: qty 1

## 2017-10-16 MED ORDER — ONDANSETRON HCL 4 MG PO TABS: 4.0000 mg | ORAL_TABLET | Freq: Three times a day (TID) | ORAL | 0 refills | Status: AC | PRN

## 2017-10-16 MED ORDER — POLYETHYLENE GLYCOL 3350 17 G PO PACK
17.0000 g | PACK | Freq: Every day | ORAL | 3 refills | Status: AC | PRN
Start: 2017-10-16 — End: ?

## 2017-10-16 MED ORDER — HYDROCORTISONE 2.5 % RE CREA
TOPICAL_CREAM | Freq: Three times a day (TID) | RECTAL | Status: DC | PRN
  Filled 2017-10-16: qty 28.35

## 2017-10-16 MED ORDER — HYDROCODONE-ACETAMINOPHEN 10-325 MG PO TABS: 1.0000 | ORAL_TABLET | Freq: Three times a day (TID) | ORAL | 0 refills | Status: DC | PRN

## 2017-10-16 MED ORDER — HYDROCORTISONE 2.5 % RE CREA: TOPICAL_CREAM | Freq: Three times a day (TID) | RECTAL | 3 refills | Status: DC | PRN

## 2017-10-16 NOTE — Progress Notes (Signed)
Louis [name]. discharged per MD order. Discussed with the patient and all questions fully answered.  VSS, Skin clean, dry and intact without evidence of skin break down, no evidence of skin tears noted.  IV catheter discontinued intact. Site without signs and symptoms of complications. Dressing and pressure applied.  An After Visit Summary was printed and given to the patient. Patient received prescription.  D/c education completed with patient/family including follow up instructions, medication list, d/c activities limitations if indicated, with other d/c instructions as indicated by MD - patient able to verbalize understanding, all questions fully answered.   Patient instructed to return to ED, call 911, or call MD for any changes in condition.   Patient to be escorted via [place], and D/C home via private auto.

## 2017-10-16 NOTE — Progress Notes (Signed)
[facility] Progress Note     Subjective: CC: no complaints Planning to be discharged today. Having watery stools. Denies abdominal pain, n/v. No more bloody stools  UOP good. VSS.   Objective: Vital signs in last 24 hours: Temp:  [98.3 F (36.8 C)-98.5 F (36.9 C)] 98.3 F (36.8 C) (01/16 0639) Pulse Rate:  [52-64] 60 (01/16 0639) Resp:  [17-18] 18 (01/16 0639) BP: (120-127)/(42-55) 123/55 (01/16 0639) SpO2:  [99 %-100 %] 100 % (01/16 0639) Last BM Date: 10/15/17  Intake/Output from previous day: 01/15 0701 - 01/16 0700 In: 2203 [P.O.:1050; I.V.:1153] Out: -  Intake/Output this shift: No intake/output data recorded.  PE: Gen: Alert, NAD, pleasant Card: Regular rate and rhythm, pedal pulses 2+ BL Pulm: Normal effort, clear to auscultation bilaterally Abd: Soft,non-tender,notdistended,+BS, no HSM Skin: warm and dry, no rashes  Psych: A&Ox3   Lab Results:  Recent Labs    10/15/17 0749 10/16/17 0411  WBC 5.5 6.8  HGB 9.8* 10.3*  HCT 31.3* 32.3*  PLT 245 279   BMET Recent Labs    10/15/17 0749 10/16/17 0411  NA 139 139  K 3.4* 3.7  CL 109 110  CO2 21* 20*  GLUCOSE 109* 107*  BUN 10 8  CREATININE 1.46* 1.51*  CALCIUM 8.5* 9.3   PT/INR No results for input(s): LABPROT, INR in the last 72 hours. CMP     Component Value Date/Time   NA 139 10/16/2017 0411   NA 148 (H) 09/16/2017 1051   K 3.7 10/16/2017 0411   K 4.6 09/16/2017 1051   CL 110 10/16/2017 0411   CL 107 09/16/2017 1051   CO2 20 (L) 10/16/2017 0411   CO2 29 09/16/2017 1051   GLUCOSE 107 (H) 10/16/2017 0411   GLUCOSE 148 (H) 09/16/2017 1051   BUN 8 10/16/2017 0411   BUN 20 09/16/2017 1051   CREATININE 1.51 (H) 10/16/2017 0411   CREATININE 1.7 (H) 09/16/2017 1051   CALCIUM 9.3 10/16/2017 0411   CALCIUM 10.1 09/16/2017 1051   PROT 5.8 (L) 10/14/2017 0623   PROT 6.8 09/16/2017 1051   ALBUMIN 3.5 10/14/2017 0623   ALBUMIN 3.8 09/16/2017 1051   AST 18 10/14/2017 0623    AST 19 09/16/2017 1051   ALT 15 (L) 10/14/2017 0623   ALT 29 09/16/2017 1051   ALKPHOS 40 10/14/2017 0623   ALKPHOS 62 09/16/2017 1051   BILITOT 0.9 10/14/2017 0623   BILITOT 0.60 09/16/2017 1051   GFRNONAA 42 (L) 10/16/2017 0411   GFRAA 49 (L) 10/16/2017 0411   Lipase     Component Value Date/Time   LIPASE 23 10/13/2017 1506       Studies/Results: Dg Abd Portable 1v-small Bowel Obstruction Protocol-initial, 8 Hr Delay  Result Date: 10/14/2017 CLINICAL DATA:  Small-bowel obstruction. EXAM: PORTABLE ABDOMEN - 1 VIEW COMPARISON:  CT abdomen and pelvis 10/13/2017 FINDINGS: There are a few dilated loops of bowel within the left lower quadrant of the abdomen measuring up to 3.1 cm. Significantly improved from 10/13/2017. No enteric contrast material is identified. IMPRESSION: 1. A few dilated loops of small bowel are identified within the left lower quadrant of the abdomen. The small-bowel obstruction pattern is significantly improved from 10/13/2017. Electronically Signed   By: Kerby Moors M.D.   On: 10/14/2017 20:54    Anti-infectives: Anti-infectives (From admission, onward)   None       Assessment/Plan Recurrent PE T2DM CKD stage III HLD GERD Hypothyroidism RA Hemochromatosis  PSBO - f/u KUB shows improvement in  bowel gas pattern - patient tolerating soft diet  - mobilize as tolerated  Bloody stools - patient reported bloody stools, most recent was not bloody - Patient without dizziness, SOB, palpitations - Hgb 10.3, stable   FEN:FLD VTE: SCDs, heparin gtt - transitioning back to home ELIQUIS ID: no current abx, afeb, WBC 4.2  OK for discharge from a surgery standpoint. Recommend following soft diet for a few days and bowel regimen when diarrhea resolves.   LOS: 2 days    Brigid Re , [facility] 10/16/2017, 11:41 AM Pager: [phone] Consults: [phone] Mon-Fri 7:00 am-4:30 pm Sat-Sun 7:00 am-11:30 am

## 2017-10-16 NOTE — Discharge Summary (Signed)
Physician Discharge Summary   Patient ID: Louis [name]. MRN: [id number] DOB/AGE: 1935-11-16 82 y.o.  Admit date: 10/13/2017 Discharge date: 10/16/2017  Primary Care Physician:  Ann Held, DO  Discharge Diagnoses:    . Small bowel obstruction ([place]) . Hypothyroidism . Hyperlipidemia LDL goal <70 . Essential hypertension . Spinal stenosis of lumbar region . DM (diabetes mellitus) type II, controlled, with peripheral vascular disorder ([place]) . History of pulmonary embolism . Chronic renal impairment, stage 3 (moderate) ([facility]) . SBO (small bowel obstruction) ([facility])   Consults: General surgery  Recommendations for Outpatient Follow-up:  1. Patient recommended soft diet for next few days 2. Please repeat CBC/BMET at next visit 3. Patient recommended to discontinue aspirin, avoid NSAIDs.   DIET: Soft carb modified diet    Allergies:   Allergies  Allergen Reactions  . Shellfish Allergy Anaphylaxis    ANGIOEDEMA  . Garlic Nausea And Vomiting  . Iron Other (See Comments)    hemacomatosis     DISCHARGE MEDICATIONS: Allergies as of 10/16/2017      Reactions   Shellfish Allergy Anaphylaxis   ANGIOEDEMA   Garlic Nausea And Vomiting   Iron Other (See Comments)   hemacomatosis      Medication List    STOP taking these medications   aspirin EC 81 MG tablet     TAKE these medications   ALPRAZolam 0.5 MG tablet Commonly known as:  XANAX TAKE 1/2 TABLET BY MOUTH EVERY 8 TO 12 HOURS AS NEEDED What changed:    how much to take  how to take this  when to take this  reasons to take this  additional instructions   atorvastatin 20 MG tablet Commonly known as:  LIPITOR TAKE 1 TABLET DAILY AT 6 P.M. What changed:  See the new instructions.   CALCIUM-VITAMIN D PO Take 1 tablet by mouth daily.   ELIQUIS 5 MG Tabs tablet Generic drug:  apixaban TAKE 1 TABLET TWICE A DAY What changed:    how much to take  how to take this  when to take this    fenofibrate 160 MG tablet TAKE 1 TABLET(160 MG) BY MOUTH DAILY   fluticasone 50 MCG/ACT nasal spray Commonly known as:  FLONASE Place 2 sprays into both nostrils daily. What changed:    when to take this  reasons to take this   folic acid 1 MG tablet Commonly known as:  FOLVITE Take 1 mg by mouth daily.   HYDROcodone-acetaminophen 10-325 MG tablet Commonly known as:  NORCO Take 1 tablet by mouth every 4 (four) hours as needed for moderate pain or severe pain (pain).   hydrocortisone 2.5 % rectal cream Commonly known as:  ANUSOL-HC Place rectally 3 (three) times daily as needed for hemorrhoids or anal itching.   levothyroxine 125 MCG tablet Commonly known as:  SYNTHROID, LEVOTHROID TAKE 1 TABLET(125 MCG) BY MOUTH DAILY   methocarbamol 500 MG tablet Commonly known as:  ROBAXIN Take 1 tablet (500 mg total) by mouth every 6 (six) hours as needed for muscle spasms.   methotrexate 2.5 MG tablet Commonly known as:  RHEUMATREX Take 20 mg by mouth every Monday.   multivitamin per tablet Take 1 tablet by mouth daily.   ondansetron 4 MG tablet Commonly known as:  ZOFRAN Take 1 tablet (4 mg total) by mouth every 8 (eight) hours as needed for nausea or vomiting.   pantoprazole 40 MG tablet Commonly known as:  PROTONIX Take 1 tablet (40 mg total) daily  by mouth.   pioglitazone 15 MG tablet Commonly known as:  ACTOS TAKE 1 TABLET DAILY What changed:    how much to take  how to take this  when to take this   polyethylene glycol packet Commonly known as:  MIRALAX Take 17 g by mouth daily as needed for moderate constipation. OTC   vitamin B-12 1000 MCG tablet Commonly known as:  CYANOCOBALAMIN Take 1,000 mcg by mouth daily.        Brief H and P: For complete details please refer to admission H and P, but in brief Louis Prestonis a 83 y.o.malewith medical history significant forrecurrent PE on lifelong anticoagulant, diabetes type 2, hyperlipidemia,  hemochromatosis, rheumatoid arthritis presents to the ED complaining of abdominal pain,nausea and dry heaving for the past 4 days. Reports abdominal pain is worse on the right side and moves across his lower abdomen, describes it as twisting in nature,associated with nausea and dry heaving but denies vomiting.No bowel movement in 3 days. Patient denied any chest pain, shortness of breath, cough, fever/chills, diarrhea. In the ED, patient remained stable CT abdomen and pelvis showed partial small bowel obstruction. Of note, patient reports having a small bowel obstruction in the past last episode was around 15 years ago. Patient has a history of appendectomy about 50 years ago. Patient has not been able to keep any solid food down due to extreme nausea. Patient admitted for further management. In the ED, CT showed partial small bowel obstruction, patient was given IV fluids, antiemetics, IV fentanyl and an NG tube was placed. General surgery was consulted  Hospital Course:     Small bowel obstruction ([place]) -Resolved -CT abdomen showed several dilated small bowel loops in the upper and mid abdomen, likely ileus or early small bowel obstruction -Patient was placed on n.p.o. status, IV fluid hydration and NG tube.  Repeat abdominal x-ray with resolving SBO. -Patient now tolerating soft diet.  -He takes Norco for his back pain, shoulder pain and does not wish to decrease the dose.  I strongly recommended to be on bowel regimen, MiraLAX, high-fiber diet while taking narcotics.  ?  Lower GI bleed -Patient had reported bloody bowel movements on 1/14 overnight and one episode on  1/15 a.m. he also has a history of diverticulosis, last colonoscopy in 2017 showed tubular adenomas and severe diverticulosis.  He has a history of internal hemorrhoids as well -Patient reports that he had been advised to take MiraLAX daily but had not been taking it hence got severely constipated -H&H currently stable, no  bloody BM since yesterday -Patient was restarted on Eliquis, heparin drip discontinued.  Patient requested to be discharged home today given his birthday today as his family is coming home for his birthday celebration.  I strongly recommended him to return back to the ER if he starts having bloody bowel movements. -Patient was on aspirin and Eliquis at home, reports that he used to be on aspirin prior to Eliquis started in September 2018.  No history of stents or severe CAD or PAD, discontinued aspirin.  Patient also recommended to avoid NSAIDs.  Recurrent PE -Patient has a history of chronic thrombi embolism, positive lupus anticoagulant, chronic bilateral PE and chronic right lower extremity DVT Per patient he was started on Eliquis in September 2018, follows hematologist outpatient -Restarted Eliquis.  Type 2 diabetes mellitus Last hemoglobin A1c 7, patient was placed on sensitive sliding scale-while inpatient  CKD stage III -Creatinine at baseline, avoid nephrotoxic  GERD -  Transition to oral PPI  Hypothyroidism Continue Synthroid  Rheumatoid arthritis Continue methotrexate every Monday  Hemochromatosis Continue outpatient follow-up with hematology.   Day of Discharge BP (!) 123/55   Pulse 60   Temp 98.3 F (36.8 C)   Resp 18   Ht 6' (1.829 m)   Wt 100.7 kg (222 lb)   SpO2 100%   BMI 30.11 kg/m   Physical Exam: General: Alert and awake oriented x3 not in any acute distress. HEENT: anicteric sclera, pupils reactive to light and accommodation CVS: S1-S2 clear no murmur rubs or gallops Chest: clear to auscultation bilaterally, no wheezing rales or rhonchi Abdomen: soft nontender, nondistended, normal bowel sounds Extremities: no cyanosis, clubbing or edema noted bilaterally Neuro: Cranial nerves II-XII intact, no focal neurological deficits   The results of significant diagnostics from this hospitalization (including imaging, microbiology, ancillary and laboratory)  are listed below for reference.    LAB RESULTS: Basic Metabolic Panel: Recent Labs  Lab 10/15/17 0749 10/16/17 0411  NA 139 139  K 3.4* 3.7  CL 109 110  CO2 21* 20*  GLUCOSE 109* 107*  BUN 10 8  CREATININE 1.46* 1.51*  CALCIUM 8.5* 9.3   Liver Function Tests: Recent Labs  Lab 10/13/17 1506 10/14/17 0623  AST 22 18  ALT 18 15*  ALKPHOS 47 40  BILITOT 0.8 0.9  PROT 6.5 5.8*  ALBUMIN 4.1 3.5   Recent Labs  Lab 10/13/17 1506  LIPASE 23   No results for input(s): AMMONIA in the last 168 hours. CBC: Recent Labs  Lab 10/13/17 1506  10/15/17 0749 10/16/17 0411  WBC 5.7   < > 5.5 6.8  NEUTROABS 4.3  --   --   --   HGB 11.2*   < > 9.8* 10.3*  HCT 35.3*   < > 31.3* 32.3*  MCV 94.4   < > 93.7 94.2  PLT 292   < > 245 279   < > = values in this interval not displayed.   Cardiac Enzymes: No results for input(s): CKTOTAL, CKMB, CKMBINDEX, TROPONINI in the last 168 hours. BNP: Invalid input(s): POCBNP CBG: Recent Labs  Lab 10/15/17 2336 10/16/17 0620  GLUCAP 102* 108*    Significant Diagnostic Studies:  Ct Abdomen Pelvis W Contrast  Result Date: 10/13/2017 CLINICAL DATA:  Right abdomen pain since Thursday. EXAM: CT ABDOMEN AND PELVIS WITH CONTRAST TECHNIQUE: Multidetector CT imaging of the abdomen and pelvis was performed using the standard protocol following bolus administration of intravenous contrast. CONTRAST:  63mL ISOVUE-300 IOPAMIDOL (ISOVUE-300) INJECTION 61% COMPARISON:  August 17, 2014 FINDINGS: Lower chest: No acute abnormality. Hepatobiliary: 1.6 cm low-density mass is identified in the right lobe liver unchanged compared to prior exam. No other focal liver lesion is identified. Gallbladder is normal. The biliary tree is normal. Pancreas: Unremarkable. No pancreatic ductal dilatation or surrounding inflammatory changes. Spleen: Normal in size without focal abnormality. Adrenals/Urinary Tract: Vascular calcification is identified in the right renal hilum.  Small cysts is identified in the midpole right kidney. No hydronephrosis is identified bilaterally. There is a 8 mm stone in the distal right ureter but no significant obstructive changes are noted. The bladder is normal. Stomach/Bowel: There is a small hiatal hernia. The stomach is otherwise normal. There are several dilated small bowel loops in the upper and mid abdomen. The distal small bowel loops are normal in caliber. The colon is normal in caliber. Patient status post prior appendectomy. Vascular/Lymphatic: Aortic atherosclerosis. No enlarged abdominal or pelvic lymph nodes.  Reproductive: Prostate calcifications are noted. Other: None. Musculoskeletal: Degenerative joint changes of the spine are identified. IMPRESSION: Several dilated small bowel loops in the upper and mid abdomen. The findings are either due to ileus or developing early bowel obstruction. Follow-up is recommended. 8 mm stone in the distal right ureter but no significant obstructive changes are identified in the right collecting system. Electronically Signed   By: Abelardo Diesel M.D.   On: 10/13/2017 16:52   Dg Abd Portable 1v-small Bowel Obstruction Protocol-initial, 8 Hr Delay  Result Date: 10/14/2017 CLINICAL DATA:  Small-bowel obstruction. EXAM: PORTABLE ABDOMEN - 1 VIEW COMPARISON:  CT abdomen and pelvis 10/13/2017 FINDINGS: There are a few dilated loops of bowel within the left lower quadrant of the abdomen measuring up to 3.1 cm. Significantly improved from 10/13/2017. No enteric contrast material is identified. IMPRESSION: 1. A few dilated loops of small bowel are identified within the left lower quadrant of the abdomen. The small-bowel obstruction pattern is significantly improved from 10/13/2017. Electronically Signed   By: Kerby Moors M.D.   On: 10/14/2017 20:54   Dg Abd Portable 1 View  Result Date: 10/13/2017 CLINICAL DATA:  NG tube placement EXAM: PORTABLE ABDOMEN - 1 VIEW COMPARISON:  CT abdomen pelvis 10/13/2017  FINDINGS: The tip of the nasogastric tube is at the gastroesophageal junction or within the first portion of the duodenum. The side port is in the stomach. Visualized lungs are clear. IMPRESSION: NG tube tip at the gastroesophageal junction or just distal to it. Side port is in the stomach. Electronically Signed   By: Ulyses Jarred M.D.   On: 10/13/2017 18:33    2D ECHO:   Disposition and Follow-up: Discharge Instructions    Diet Carb Modified   Complete by:  As directed    Discharge instructions   Complete by:  As directed    Please STOP Aspirin. Continue Eliquis. If you start having bloody bowel movements at home, please return back to ER immediately. Avoid NSAIDS (Aleve, ibuprofen, motrin etc).   Take miralax and high fiber diet to avoid constipation.   Increase activity slowly   Complete by:  As directed        DISPOSITION: Home  DISCHARGE FOLLOW-UP Follow-up Information    Ann Held, DO. Schedule an appointment as soon as possible for a visit in 2 week(s).   Specialty:  Family Medicine Contact information: [address] [place] [address] [phone]            Time spent on Discharge: 40-minutes  Signed:   Estill Cotta M.D. [facility] 10/16/2017, 12:37 PM Pager: [phone]

## 2017-10-16 NOTE — Discharge Instructions (Signed)
Information on my medicine - ELIQUIS® (apixaban) ° °This medication education was reviewed with me or my healthcare representative as part of my discharge preparation.  The pharmacist that spoke with me during my hospital stay was:  °Marwa Fuhrman Dien, RPH ° °Why was Eliquis® prescribed for you? °Eliquis® was prescribed to treat blood clots that may have been found in the veins of your legs (deep vein thrombosis) or in your lungs (pulmonary embolism) and to reduce the risk of them occurring again. ° °What do You need to know about Eliquis® ? °The dose is ONE 5 mg tablet taken TWICE daily.  Eliquis® may be taken with or without food.  ° °Try to take the dose about the same time in the morning and in the evening. If you have difficulty swallowing the tablet whole please discuss with your pharmacist how to take the medication safely. ° °Take Eliquis® exactly as prescribed and DO NOT stop taking Eliquis® without talking to the doctor who prescribed the medication.  Stopping may increase your risk of developing a new blood clot.  Refill your prescription before you run out. ° °After discharge, you should have regular check-up appointments with your healthcare provider that is prescribing your Eliquis®. °   °What do you do if you miss a dose? °If a dose of ELIQUIS® is not taken at the scheduled time, take it as soon as possible on the same day and twice-daily administration should be resumed. The dose should not be doubled to make up for a missed dose. ° °Important Safety Information °A possible side effect of Eliquis® is bleeding. You should call your healthcare provider right away if you experience any of the following: °? Bleeding from an injury or your nose that does not stop. °? Unusual colored urine (red or dark brown) or unusual colored stools (red or black). °? Unusual bruising for unknown reasons. °? A serious fall or if you hit your head (even if there is no bleeding). ° °Some medicines may interact with Eliquis®  and might increase your risk of bleeding or clotting while on Eliquis®. To help avoid this, consult your healthcare provider or pharmacist prior to using any new prescription or non-prescription medications, including herbals, vitamins, non-steroidal anti-inflammatory drugs (NSAIDs) and supplements. ° °This website has more information on Eliquis® (apixaban): http://www.eliquis.com/eliquis/home ° °

## 2017-10-16 NOTE — Progress Notes (Signed)
ANTICOAGULATION CONSULT NOTE  Pharmacy Consult:  Heparin >> Eliquis Indication:  History of recurrent VTE and Lupus AC  Allergies  Allergen Reactions  . Shellfish Allergy Anaphylaxis    ANGIOEDEMA  . Garlic Nausea And Vomiting  . Iron Other (See Comments)    hemacomatosis    Patient Measurements: Height: 6' (182.9 cm) Weight: 222 lb (100.7 kg) IBW/kg (Calculated) : 77.6 Heparin Dosing Weight: 98 kg  Vital Signs: Temp: 98.3 F (36.8 C) (01/16 0639) Temp Source: Oral (01/15 2300) BP: 123/55 (01/16 0639) Pulse Rate: 60 (01/16 0639)  Labs: Recent Labs    10/14/17 0623 10/14/17 1502 10/15/17 0749 10/16/17 0411  HGB 10.4*  --  9.8* 10.3*  HCT 32.3*  --  31.3* 32.3*  PLT 240  --  245 279  APTT  --  81* 92* 98*  HEPARINUNFRC  --  1.22* 0.77* 0.66  CREATININE 1.63*  --  1.46* 1.51*    Estimated Creatinine Clearance: 47.9 mL/min (A) (by C-G formula based on SCr of 1.51 mg/dL (H)).    Assessment: 35 YOM presented with right upper abdominal pain, nausea and anorexia.  Patient has a history of recurrent VTEs and Lupus AC positive  on Eliquis PTA.  Patient was transitioned to IV heparin, and now to restart Eliquis.  Patient reported bloody bowel movements 10/14/17 (RN did not witness) and none on 10/15/17.  Noted MD documentation of history of severe diverticulosis, to continue anticoagulation and consult GI if bleeding persists or significant drop in hemoglobin.  CBC currently stable.   Goal of Therapy:  Heparin level 0.3-0.7 units/ml aPTT 66 - 102 seconds Monitor platelets by anticoagulation protocol: Yes    Plan:  Eliquis 5mg  PO BID, stop IV heparin when Eliquis is administered (RN aware) Pharmacy will sign off and follow peripherally.  Thank you for the consult! Consider resuming home meds if not discharged   Cung Masterson D. Mina Marble, PharmD, BCPS Pager:  [phone] 10/16/2017, 8:29 AM

## 2017-10-18 ENCOUNTER — Telehealth: Payer: Self-pay

## 2017-10-18 NOTE — Telephone Encounter (Signed)
10/18/17  TCM Hospital Follow Up  Transition Care Management Follow-up Telephone Call  ADMISSION DATE: 10/13/17  DISCHARGE DATE: 10/16/17   How have you been since you were released from the hospital? Doing well per wife.  Do you understand why you were in the hospital? Yes   Do you understand the discharge instrcutions? Yes    Items Reviewed:  Medications reviewed: Yes   Allergies reviewed:Yes   Dietary changes reviewed: Soft Car modified per order. Wife states patient is eating  Regular diet.     Referrals reviewed: Hospital follow up scheduled with provider   Functional Questionnaire:   Activities of Daily Living (ADLs): Independent with all ADL's  Any patient concerns? None at this time per wife.   Confirmed importance and date/time of follow-up visits scheduled: Yes   Confirmed with patient if condition begins to worsen call PCP or go to the ER. Yes    Patient was given the office number and encouragred to call back with questions or concerns. Yes

## 2017-10-31 ENCOUNTER — Other Ambulatory Visit: Payer: Self-pay | Admitting: Family Medicine

## 2017-10-31 ENCOUNTER — Ambulatory Visit (INDEPENDENT_AMBULATORY_CARE_PROVIDER_SITE_OTHER): Payer: Medicare Other | Admitting: Family Medicine

## 2017-10-31 ENCOUNTER — Encounter: Payer: Self-pay | Admitting: Family Medicine

## 2017-10-31 VITALS — BP 130/60 | HR 77 | Temp 98.0°F | Resp 16 | Ht 71.0 in | Wt 221.2 lb

## 2017-10-31 DIAGNOSIS — I2699 Other pulmonary embolism without acute cor pulmonale: Secondary | ICD-10-CM | POA: Diagnosis not present

## 2017-10-31 DIAGNOSIS — I10 Essential (primary) hypertension: Secondary | ICD-10-CM

## 2017-10-31 DIAGNOSIS — Z8719 Personal history of other diseases of the digestive system: Secondary | ICD-10-CM | POA: Diagnosis not present

## 2017-10-31 DIAGNOSIS — E1151 Type 2 diabetes mellitus with diabetic peripheral angiopathy without gangrene: Secondary | ICD-10-CM

## 2017-10-31 DIAGNOSIS — E785 Hyperlipidemia, unspecified: Secondary | ICD-10-CM

## 2017-10-31 LAB — CBC WITH DIFFERENTIAL/PLATELET
BASOS PCT: 1.8 % (ref 0.0–3.0)
Basophils Absolute: 0.1 10*3/uL (ref 0.0–0.1)
EOS ABS: 0.2 10*3/uL (ref 0.0–0.7)
EOS PCT: 3.7 % (ref 0.0–5.0)
HEMATOCRIT: 36.2 % — AB (ref 39.0–52.0)
Hemoglobin: 11.8 g/dL — ABNORMAL LOW (ref 13.0–17.0)
Lymphocytes Relative: 30.3 % (ref 12.0–46.0)
Lymphs Abs: 1.3 10*3/uL (ref 0.7–4.0)
MCHC: 32.6 g/dL (ref 30.0–36.0)
MCV: 93.6 fl (ref 78.0–100.0)
Monocytes Absolute: 0.2 10*3/uL (ref 0.1–1.0)
Monocytes Relative: 4.7 % (ref 3.0–12.0)
Neutro Abs: 2.6 10*3/uL (ref 1.4–7.7)
Neutrophils Relative %: 59.5 % (ref 43.0–77.0)
Platelets: 354 10*3/uL (ref 150.0–400.0)
RBC: 3.86 Mil/uL — ABNORMAL LOW (ref 4.22–5.81)
RDW: 16.9 % — AB (ref 11.5–15.5)
WBC: 4.3 10*3/uL (ref 4.0–10.5)

## 2017-10-31 LAB — COMPREHENSIVE METABOLIC PANEL
ALT: 16 U/L (ref 0–53)
AST: 19 U/L (ref 0–37)
Albumin: 4.3 g/dL (ref 3.5–5.2)
Alkaline Phosphatase: 51 U/L (ref 39–117)
BUN: 26 mg/dL — AB (ref 6–23)
CHLORIDE: 105 meq/L (ref 96–112)
CO2: 26 meq/L (ref 19–32)
CREATININE: 1.83 mg/dL — AB (ref 0.40–1.50)
Calcium: 9 mg/dL (ref 8.4–10.5)
GFR: 38.04 mL/min — ABNORMAL LOW (ref >60.00)
Glucose, Bld: 127 mg/dL — ABNORMAL HIGH (ref 70–99)
Potassium: 3.9 mEq/L (ref 3.5–5.1)
SODIUM: 139 meq/L (ref 135–145)
Total Bilirubin: 0.5 mg/dL (ref 0.2–1.2)
Total Protein: 7.1 g/dL (ref 6.0–8.3)

## 2017-10-31 NOTE — Assessment & Plan Note (Signed)
Life long eliquis  Per hematology

## 2017-10-31 NOTE — Progress Notes (Signed)
Patient ID: Louis [name]., male   DOB: 22-Jun-1936, 82 y.o.   MRN: [id number]    Subjective:  I acted as a Education administrator for Dr. Carollee Herter.  Guerry Bruin, [place]   Patient ID: Louis [name]., male    DOB: [birth year], 82 y.o.   MRN: [id number]  Chief Complaint  Patient presents with  . Hospitalization Follow-up    10/13/17-10/16/17    HPI Patient is in today for hospital follow up 10/13/17-10/16/17 for small bowel obstruction.   Pt is feeling better but he was told to take miralax daily and now he is running to the bathroom a lot.   Not diarrhea but soft and several x a day. He is hoping to dec this.    Past Medical History:  Diagnosis Date  . Anemia   . Arthritis    Dr Amil Amen, [facility] rx- Rheumatology  . Diabetes mellitus   . Diverticulosis   . GERD (gastroesophageal reflux disease)   . Hemochromatosis     Dr Henrene Pastor  . History of kidney stones 1980   passed spont.  Marland Kitchen Hx of adenomatous colonic polyps   . Hyperlipidemia   . Hypertension   . Hypothyroidism   . IBS (irritable bowel syndrome)   . Internal hemorrhoids   . Kidney stones, calcium oxalate    1985 & 2005  . Pulmonary embolism ([place]) 2015   hosp. with Heparin & then on Xarelto- 3 months or so  . Reflux esophagitis   . SBO (small bowel obstruction) ([place]) 2004  . Spinal stenosis    Dr.Ramos  . Testosterone deficiency    111 in 2007    Past Surgical History:  Procedure Laterality Date  . APPENDECTOMY    . cataract  2005   bilaterally  . COLONOSCOPY W/ POLYPECTOMY  2005   tubular adenoma  . JOINT REPLACEMENT     L- shoulder, x2, replacement on both knees   . L4-5 & L5- S1 bilat facet injections  01/20/14    Dr Nelva Bush  . LUMBAR LAMINECTOMY WITH COFLEX 2 LEVEL N/A 07/30/2016   Procedure: Lumbar three- four Lumbar four - five Laminectomy and foraminotomy;  Surgeon: Kristeen Miss, MD;  Location: [facility];  Service: Neurosurgery;  Laterality: N/A;  L3-4 L4-5 Laminectomy with coflex  . lumbar nerve block      X 14; Dr Nelva Bush  .  MIDDLE EAR SURGERY  1980   Teflon tack   . REFRACTIVE SURGERY  2005   SE Ophth  . SHOULDER SURGERY      2 on L ; 3 on R  . TM replacement  1981  . TONSILLECTOMY AND ADENOIDECTOMY    . TOTAL KNEE ARTHROPLASTY   201 & 2012    bilaterally;Dr Alusio  . TOTAL SHOULDER REPLACEMENT     L    Family History  Problem Relation Age of Onset  . Diabetes Maternal Aunt   . Depression Brother        suicide  . Diabetes Father   . Stroke Father 73       MI in hospital for CVA  . Heart failure Mother        valvular disease  . Heart disease Paternal Aunt         2 aunts; 1 had MI @ ?> 61  . Heart disease Paternal Uncle           . Breast cancer Maternal Grandmother   . Colon cancer Neg Hx   . COPD Neg  Hx   . Asthma Neg Hx     Social History   Socioeconomic History  . Marital status: Married    Spouse name: Not on file  . Number of children: Not on file  . Years of education: Not on file  . Highest education level: Not on file  Social Needs  . Financial resource strain: Not on file  . Food insecurity - worry: Not on file  . Food insecurity - inability: Not on file  . Transportation needs - medical: Not on file  . Transportation needs - non-medical: Not on file  Occupational History  . Not on file  Tobacco Use  . Smoking status: Former Smoker    Last attempt to quit: 10/01/1980    Years since quitting: 37.1  . Smokeless tobacco: Never Used  Substance and Sexual Activity  . Alcohol use: Yes    Alcohol/week: 0.0 oz    Comment: rare   . Drug use: No  . Sexual activity: Not Currently  Other Topics Concern  . Not on file  Social History Narrative   Daily caffeine     Outpatient Medications Prior to Visit  Medication Sig Dispense Refill  . ALPRAZolam (XANAX) 0.5 MG tablet TAKE 1/2 TABLET BY MOUTH EVERY 8 TO 12 HOURS AS NEEDED (Patient taking differently: Take 0.5 mg by mouth at bedtime as needed for anxiety. ) 30 tablet 0  . atorvastatin (LIPITOR) 20 MG tablet TAKE 1 TABLET  DAILY AT 6 P.M. (Patient taking differently: TAKE 20mg  DAILY AT 6 P.M.) 90 tablet 3  . CALCIUM-VITAMIN D PO Take 1 tablet by mouth daily.     Marland Kitchen ELIQUIS 5 MG TABS tablet TAKE 1 TABLET TWICE A DAY (Patient taking differently: TAKE 5 mg TABLET TWICE A DAY) 180 tablet 0  . fenofibrate 160 MG tablet TAKE 1 TABLET(160 MG) BY MOUTH DAILY 30 tablet 0  . fluticasone (FLONASE) 50 MCG/ACT nasal spray Place 2 sprays into both nostrils daily. (Patient taking differently: Place 2 sprays into both nostrils daily as needed for allergies or rhinitis. ) 16 g 1  . folic acid (FOLVITE) 1 MG tablet Take 1 mg by mouth daily.      Marland Kitchen HYDROcodone-acetaminophen (NORCO) 10-325 MG tablet Take 1 tablet by mouth every 4 (four) hours as needed for moderate pain or severe pain (pain). 30 tablet 0  . hydrocortisone (ANUSOL-HC) 2.5 % rectal cream Place rectally 3 (three) times daily as needed for hemorrhoids or anal itching. 30 g 3  . levothyroxine (SYNTHROID, LEVOTHROID) 125 MCG tablet TAKE 1 TABLET(125 MCG) BY MOUTH DAILY 90 tablet 0  . Liniments (BLUE-EMU SUPER STRENGTH EX) Apply 1 application topically daily as needed (pain).    . methocarbamol (ROBAXIN) 500 MG tablet Take 1 tablet (500 mg total) by mouth every 6 (six) hours as needed for muscle spasms. 40 tablet 3  . methotrexate (RHEUMATREX) 2.5 MG tablet Take 20 mg by mouth every Monday.     . multivitamin (THERAGRAN) per tablet Take 1 tablet by mouth daily.      . ondansetron (ZOFRAN) 4 MG tablet Take 1 tablet (4 mg total) by mouth every 8 (eight) hours as needed for nausea or vomiting. 20 tablet 0  . pantoprazole (PROTONIX) 40 MG tablet Take 1 tablet (40 mg total) daily by mouth. 90 tablet 1  . pioglitazone (ACTOS) 15 MG tablet TAKE 1 TABLET DAILY 90 tablet 3  . polyethylene glycol (MIRALAX) packet Take 17 g by mouth daily as needed for moderate  constipation. OTC 30 each 3  . vitamin B-12 (CYANOCOBALAMIN) 1000 MCG tablet Take 1,000 mcg by mouth daily.     No  facility-administered medications prior to visit.     Allergies  Allergen Reactions  . Shellfish Allergy Anaphylaxis    ANGIOEDEMA  . Garlic Nausea And Vomiting  . Iron Other (See Comments)    hemacomatosis    Review of Systems  Constitutional: Negative for fever and malaise/fatigue.  HENT: Negative for congestion.   Eyes: Negative for blurred vision.  Respiratory: Negative for cough and shortness of breath.   Cardiovascular: Negative for chest pain, palpitations and leg swelling.  Gastrointestinal: Negative for vomiting.  Musculoskeletal: Negative for back pain.  Skin: Negative for rash.  Neurological: Negative for loss of consciousness and headaches.       Objective:    Physical Exam  Constitutional: He is oriented to person, place, and time. Vital signs are normal. He appears well-developed and well-nourished. He is sleeping.  HENT:  Head: Normocephalic and atraumatic.  Mouth/Throat: Oropharynx is clear and moist.  Eyes: EOM are normal. Pupils are equal, round, and reactive to light.  Neck: Normal range of motion. Neck supple. No thyromegaly present.  Cardiovascular: Normal rate and regular rhythm.  No murmur heard. Pulmonary/Chest: Effort normal and breath sounds normal. No respiratory distress. He has no wheezes. He has no rales. He exhibits no tenderness.  Musculoskeletal: He exhibits no edema or tenderness.  Neurological: He is alert and oriented to person, place, and time.  Skin: Skin is warm and dry.  Psychiatric: He has a normal mood and affect. His behavior is normal. Judgment and thought content normal.  Nursing note and vitals reviewed.   BP 130/60 (BP Location: Right Arm, Cuff Size: Large)   Pulse 77   Temp 98 F (36.7 C) (Oral)   Resp 16   Ht 5\' 11"  (1.803 m)   Wt 221 lb 3.2 oz (100.3 kg)   SpO2 97%   BMI 30.85 kg/m  Wt Readings from Last 3 Encounters:  10/31/17 221 lb 3.2 oz (100.3 kg)  10/13/17 222 lb (100.7 kg)  09/16/17 222 lb (100.7 kg)      Lab Results  Component Value Date   WBC 6.8 10/16/2017   HGB 10.3 (L) 10/16/2017   HCT 32.3 (L) 10/16/2017   PLT 279 10/16/2017   GLUCOSE 107 (H) 10/16/2017   CHOL 175 05/21/2017   TRIG 193.0 (H) 05/21/2017   HDL 42.50 05/21/2017   LDLDIRECT 120.0 12/02/2015   LDLCALC 93 05/21/2017   ALT 15 (L) 10/14/2017   AST 18 10/14/2017   NA 139 10/16/2017   K 3.7 10/16/2017   CL 110 10/16/2017   CREATININE 1.51 (H) 10/16/2017   BUN 8 10/16/2017   CO2 20 (L) 10/16/2017   TSH 0.37 05/21/2017   PSA 1.67 10/11/2016   INR 1.02 06/18/2017   HGBA1C 7.0 (H) 05/21/2017   MICROALBUR 3.0 (H) 12/08/2014    Lab Results  Component Value Date   TSH 0.37 05/21/2017   Lab Results  Component Value Date   WBC 6.8 10/16/2017   HGB 10.3 (L) 10/16/2017   HCT 32.3 (L) 10/16/2017   MCV 94.2 10/16/2017   PLT 279 10/16/2017   Lab Results  Component Value Date   NA 139 10/16/2017   K 3.7 10/16/2017   CO2 20 (L) 10/16/2017   GLUCOSE 107 (H) 10/16/2017   BUN 8 10/16/2017   CREATININE 1.51 (H) 10/16/2017   BILITOT 0.9 10/14/2017  ALKPHOS 40 10/14/2017   AST 18 10/14/2017   ALT 15 (L) 10/14/2017   PROT 5.8 (L) 10/14/2017   ALBUMIN 3.5 10/14/2017   CALCIUM 9.3 10/16/2017   ANIONGAP 9 10/16/2017   GFR 41.46 (L) 06/27/2017   Lab Results  Component Value Date   CHOL 175 05/21/2017   Lab Results  Component Value Date   HDL 42.50 05/21/2017   Lab Results  Component Value Date   LDLCALC 93 05/21/2017   Lab Results  Component Value Date   TRIG 193.0 (H) 05/21/2017   Lab Results  Component Value Date   CHOLHDL 4 05/21/2017   Lab Results  Component Value Date   HGBA1C 7.0 (H) 05/21/2017       Assessment & Plan:   Problem List Items Addressed This Visit      Unprioritized   DM (diabetes mellitus) type II, controlled, with peripheral vascular disorder ([place])    hgba1c acceptable, minimize simple carbs. Increase exercise as tolerated. Continue current meds        Essential hypertension    Well controlled, no changes to meds. Encouraged heart healthy diet such as the DASH diet and exercise as tolerated.       Hyperlipidemia LDL goal <70    Tolerating statin, encouraged heart healthy diet, avoid trans fats, minimize simple carbs and saturated fats. Increase exercise as tolerated      Recurrent pulmonary embolism (HCC)    Life long eliquis  Per hematology       Other Visit Diagnoses    Hx SBO    -  Primary   Relevant Orders   Comprehensive metabolic panel   CBC with Differential/Platelet          Pt will cut down on the miralax to every other day       Pt getting ready to have arthroscopic surgery on his shoulder He can put off his ov for a few months since he will be recuperating   I am having Louis [name]. maintain his CALCIUM-VITAMIN D PO, folic acid, methotrexate, multivitamin, vitamin B-12, methocarbamol, atorvastatin, fluticasone, pioglitazone, levothyroxine, pantoprazole, ALPRAZolam, ELIQUIS, hydrocortisone, ondansetron, polyethylene glycol, HYDROcodone-acetaminophen, Liniments (BLUE-EMU SUPER STRENGTH EX), and fenofibrate.  No orders of the defined types were placed in this encounter.   CMA served as Education administrator during this visit. History, Physical and Plan performed by medical provider. Documentation and orders reviewed and attested to.  Ann Held, DO

## 2017-10-31 NOTE — H&P (Signed)
Gumecindo Hopkin. is an 82 y.o. male.    Chief Complaint: right shoulder pain  HPI: Pt is a 82 y.o. male complaining of right shoulder pain for multiple years. Pain had continually increased since the beginning. X-rays in the clinic show end-stage arthritic changes of the right shoulder. Pt has tried various conservative treatments which have failed to alleviate their symptoms, including injections and therapy. Various options are discussed with the patient. Risks, benefits and expectations were discussed with the patient. Patient understand the risks, benefits and expectations and wishes to proceed with surgery.   PCP:  Ann Held, DO  D/C Plans: Home  PMH: Past Medical History:  Diagnosis Date  . Anemia   . Arthritis    Dr Amil Amen, [facility] rx- Rheumatology  . Diabetes mellitus   . Diverticulosis   . GERD (gastroesophageal reflux disease)   . Hemochromatosis     Dr Henrene Pastor  . History of kidney stones 1980   passed spont.  Marland Kitchen Hx of adenomatous colonic polyps   . Hyperlipidemia   . Hypertension   . Hypothyroidism   . IBS (irritable bowel syndrome)   . Internal hemorrhoids   . Kidney stones, calcium oxalate    1985 & 2005  . Pulmonary embolism ([place]) 2015   hosp. with Heparin & then on Xarelto- 3 months or so  . Reflux esophagitis   . SBO (small bowel obstruction) ([place]) 2004  . Spinal stenosis    Dr.Ramos  . Testosterone deficiency    111 in 2007    PSH: Past Surgical History:  Procedure Laterality Date  . APPENDECTOMY    . cataract  2005   bilaterally  . COLONOSCOPY W/ POLYPECTOMY  2005   tubular adenoma  . JOINT REPLACEMENT     L- shoulder, x2, replacement on both knees   . L4-5 & L5- S1 bilat facet injections  01/20/14    Dr Nelva Bush  . LUMBAR LAMINECTOMY WITH COFLEX 2 LEVEL N/A 07/30/2016   Procedure: Lumbar three- four Lumbar four - five Laminectomy and foraminotomy;  Surgeon: Kristeen Miss, MD;  Location: [facility];  Service: Neurosurgery;  Laterality: N/A;   L3-4 L4-5 Laminectomy with coflex  . lumbar nerve block      X 14; Dr Nelva Bush  . MIDDLE EAR SURGERY  1980   Teflon tack   . REFRACTIVE SURGERY  2005   SE Ophth  . SHOULDER SURGERY      2 on L ; 3 on R  . TM replacement  1981  . TONSILLECTOMY AND ADENOIDECTOMY    . TOTAL KNEE ARTHROPLASTY   201 & 2012    bilaterally;Dr Alusio  . TOTAL SHOULDER REPLACEMENT     L    Social History:  reports that he quit smoking about 37 years ago. he has never used smokeless tobacco. He reports that he drinks alcohol. He reports that he does not use drugs.  Allergies:  Allergies  Allergen Reactions  . Shellfish Allergy Anaphylaxis    ANGIOEDEMA  . Garlic Nausea And Vomiting  . Iron Other (See Comments)    hemacomatosis    Medications: No current facility-administered medications for this encounter.    Current Outpatient Medications  Medication Sig Dispense Refill  . ALPRAZolam (XANAX) 0.5 MG tablet TAKE 1/2 TABLET BY MOUTH EVERY 8 TO 12 HOURS AS NEEDED (Patient taking differently: Take 0.5 mg by mouth at bedtime as needed for anxiety. ) 30 tablet 0  . atorvastatin (LIPITOR) 20 MG tablet  TAKE 1 TABLET DAILY AT 6 P.M. (Patient taking differently: TAKE 20mg  DAILY AT 6 P.M.) 90 tablet 3  . CALCIUM-VITAMIN D PO Take 1 tablet by mouth daily.     Marland Kitchen ELIQUIS 5 MG TABS tablet TAKE 1 TABLET TWICE A DAY (Patient taking differently: TAKE 5 mg TABLET TWICE A DAY) 180 tablet 0  . fluticasone (FLONASE) 50 MCG/ACT nasal spray Place 2 sprays into both nostrils daily. (Patient taking differently: Place 2 sprays into both nostrils daily as needed for allergies or rhinitis. ) 16 g 1  . folic acid (FOLVITE) 1 MG tablet Take 1 mg by mouth daily.      Marland Kitchen HYDROcodone-acetaminophen (NORCO) 10-325 MG tablet Take 1 tablet by mouth every 4 (four) hours as needed for moderate pain or severe pain (pain). 30 tablet 0  . hydrocortisone (ANUSOL-HC) 2.5 % rectal cream Place rectally 3 (three) times daily as needed for hemorrhoids or  anal itching. 30 g 3  . levothyroxine (SYNTHROID, LEVOTHROID) 125 MCG tablet TAKE 1 TABLET(125 MCG) BY MOUTH DAILY 90 tablet 0  . Liniments (BLUE-EMU SUPER STRENGTH EX) Apply 1 application topically daily as needed (pain).    . methocarbamol (ROBAXIN) 500 MG tablet Take 1 tablet (500 mg total) by mouth every 6 (six) hours as needed for muscle spasms. 40 tablet 3  . methotrexate (RHEUMATREX) 2.5 MG tablet Take 20 mg by mouth every Monday.     . multivitamin (THERAGRAN) per tablet Take 1 tablet by mouth daily.      . ondansetron (ZOFRAN) 4 MG tablet Take 1 tablet (4 mg total) by mouth every 8 (eight) hours as needed for nausea or vomiting. 20 tablet 0  . pantoprazole (PROTONIX) 40 MG tablet Take 1 tablet (40 mg total) daily by mouth. 90 tablet 1  . pioglitazone (ACTOS) 15 MG tablet TAKE 1 TABLET DAILY 90 tablet 3  . polyethylene glycol (MIRALAX) packet Take 17 g by mouth daily as needed for moderate constipation. OTC 30 each 3  . vitamin B-12 (CYANOCOBALAMIN) 1000 MCG tablet Take 1,000 mcg by mouth daily.    . fenofibrate 160 MG tablet TAKE 1 TABLET(160 MG) BY MOUTH DAILY 30 tablet 0    No results found for this or any previous visit (from the past 48 hour(s)). No results found.  ROS: Pain with rom of the right upper extremity  Physical Exam:  Alert and oriented 82 y.o. male in no acute distress Cranial nerves 2-12 intact Cervical spine: full rom with no tenderness, nv intact distally Chest: active breath sounds bilaterally, no wheeze rhonchi or rales Heart: regular rate and rhythm, no murmur Abd: non tender non distended with active bowel sounds Hip is stable with rom  Right shoulder limited rom due to arthropathy Crepitus with rom nv intact distally No rashes or edema distally  Assessment/Plan Assessment: right shoulder cuff arthropathy  Plan: Patient will undergo a right reverse total shoulder by Dr. Veverly Fells at [facility]. Risks benefits and expectations were discussed with  the patient. Patient understand risks, benefits and expectations and wishes to proceed.  Merla Riches PA-C, MPAS [facility] is now [employer] [facility] [address] [place], [place], [place] [address] Phone: [phone] [email or web address] Facebook  [employer]

## 2017-10-31 NOTE — Assessment & Plan Note (Signed)
Well controlled, no changes to meds. Encouraged heart healthy diet such as the DASH diet and exercise as tolerated.  °

## 2017-10-31 NOTE — Patient Instructions (Signed)
Small Bowel Obstruction °A small bowel obstruction means that something is blocking the small bowel. The small bowel is also called the small intestine. It is the long tube that connects the stomach to the colon. An obstruction will stop food and fluids from passing through the small bowel. Treatment depends on what is causing the problem and how bad the problem is. °Follow these instructions at home: °· Get a lot of rest. °· Follow your diet as told by your doctor. You may need to: °? Only drink clear liquids until you start to get better. °? Avoid solid foods as told by your doctor. °· Take over-the-counter and prescription medicines only as told by your doctor. °· Keep all follow-up visits as told by your doctor. This is important. °Contact a doctor if: °· You have a fever. °· You have chills. °Get help right away if: °· You have pain or cramps that get worse. °· You throw up (vomit) blood. °· You have a feeling of being sick to your stomach (nausea) that does not go away. °· You cannot stop throwing up. °· You cannot drink fluids. °· You feel confused. °· You feel dry or thirsty (dehydrated). °· Your belly gets more bloated. °· You feel weak or you pass out (faint). °This information is not intended to replace advice given to you by your health care provider. Make sure you discuss any questions you have with your health care provider. °Document Released: 10/25/2004 Document Revised: 05/14/2016 Document Reviewed: 11/11/2014 °Elsevier Interactive Patient Education © 2018 [employer] ° °

## 2017-10-31 NOTE — Assessment & Plan Note (Signed)
hgba1c acceptable, minimize simple carbs. Increase exercise as tolerated. Continue current meds 

## 2017-10-31 NOTE — Assessment & Plan Note (Signed)
Tolerating statin, encouraged heart healthy diet, avoid trans fats, minimize simple carbs and saturated fats. Increase exercise as tolerated 

## 2017-11-04 DIAGNOSIS — M810 Age-related osteoporosis without current pathological fracture: Secondary | ICD-10-CM | POA: Diagnosis not present

## 2017-11-04 DIAGNOSIS — M0609 Rheumatoid arthritis without rheumatoid factor, multiple sites: Secondary | ICD-10-CM | POA: Diagnosis not present

## 2017-11-04 DIAGNOSIS — E669 Obesity, unspecified: Secondary | ICD-10-CM | POA: Diagnosis not present

## 2017-11-04 DIAGNOSIS — M150 Primary generalized (osteo)arthritis: Secondary | ICD-10-CM | POA: Diagnosis not present

## 2017-11-04 DIAGNOSIS — Z6830 Body mass index (BMI) 30.0-30.9, adult: Secondary | ICD-10-CM | POA: Diagnosis not present

## 2017-11-04 DIAGNOSIS — M5136 Other intervertebral disc degeneration, lumbar region: Secondary | ICD-10-CM | POA: Diagnosis not present

## 2017-11-05 DIAGNOSIS — Z79891 Long term (current) use of opiate analgesic: Secondary | ICD-10-CM | POA: Diagnosis not present

## 2017-11-05 DIAGNOSIS — M545 Low back pain: Secondary | ICD-10-CM | POA: Diagnosis not present

## 2017-11-05 DIAGNOSIS — G894 Chronic pain syndrome: Secondary | ICD-10-CM | POA: Diagnosis not present

## 2017-11-06 ENCOUNTER — Other Ambulatory Visit (INDEPENDENT_AMBULATORY_CARE_PROVIDER_SITE_OTHER): Payer: Medicare Other

## 2017-11-06 ENCOUNTER — Encounter: Payer: Self-pay | Admitting: *Deleted

## 2017-11-06 DIAGNOSIS — R739 Hyperglycemia, unspecified: Secondary | ICD-10-CM

## 2017-11-06 LAB — HEMOGLOBIN A1C: Hgb A1c MFr Bld: 6.6 % — ABNORMAL HIGH (ref 4.6–6.5)

## 2017-11-07 NOTE — Pre-Procedure Instructions (Signed)
[name]  11/07/2017      [facility] [address], [place] [address] Phone: [phone] Fax: [phone]  [facility] [place], [place] [address] [place] [address] Phone: [phone] Fax: [phone]    Your procedure is scheduled on November 15, 2017.  Report to [facility] Admitting at 08:00 A.M.  Call this number if you have problems the morning of surgery:  [phone]   Remember:  Do not eat food or drink liquids after midnight.   Take these medicines the morning of surgery with A SIP OF WATER :  Levothyroxine (Synthroid) Hydrocodone-Acetaminophen (Norco) pain pill if needed Flonase nasal spray if needed Alprazolam (Xanax) if needed Pantoprazole (Protonix)  Follow your Doctor's instructions regarding your Eliquis.   7 days prior to surgery STOP taking any Aspirin (unless otherwise instructed by your surgeon), Aleve, Naproxen, Ibuprofen, Motrin, Advil, Goody's, BC's, all herbal medications, fish oil, and all vitamins.  DO NOT take any diabetes medication, Pioglitazone (ACTOS), the morning of surgery.     How to Manage Your Diabetes Before and After Surgery  Why is it important to control my blood sugar before and after surgery? . Improving blood sugar levels before and after surgery helps healing and can limit problems. . A way of improving blood sugar control is eating a healthy diet by: o  Eating less sugar and carbohydrates o  Increasing activity/exercise o  Talking with your doctor about reaching your blood sugar goals . High blood sugars (greater than 180 mg/dL) can raise your risk of infections and slow your recovery, so you will need to focus on controlling your diabetes during the weeks before surgery. . Make sure that the doctor who takes care of your diabetes knows about your planned surgery  including the date and location.  How do I manage my blood sugar before surgery? . Check your blood sugar at least 4 times a day, starting 2 days before surgery, to make sure that the level is not too high or low. o Check your blood sugar the morning of your surgery when you wake up and every 2 hours until you get to the Short Stay unit. . If your blood sugar is less than 70 mg/dL, you will need to treat for low blood sugar: o Do not take insulin. o Treat a low blood sugar (less than 70 mg/dL) with  cup of clear juice (cranberry or apple), 4 glucose tablets, OR glucose gel. Recheck blood sugar in 15 minutes after treatment (to make sure it is greater than 70 mg/dL). If your blood sugar is not greater than 70 mg/dL on recheck, call [phone] o  for further instructions. . Report your blood sugar to the short stay nurse when you get to Short Stay.  . If you are admitted to the hospital after surgery: o Your blood sugar will be checked by the staff and you will probably be given insulin after surgery (instead of oral diabetes medicines) to make sure you have good blood sugar levels. o The goal for blood sugar control after surgery is 80-180 mg/dL.       Do not wear jewelry.  Do not wear lotions, powders, or perfumes, or deodorant.  Do not shave 48 hours prior to surgery.  Men may shave face and neck.  Do not bring  valuables to the hospital.  [facility] is not responsible for any belongings or valuables.  Contacts, dentures or bridgework may not be worn into surgery.  Leave your suitcase in the car.  After surgery it may be brought to your room.  For patients admitted to the hospital, discharge time will be determined by your treatment team.  Patients discharged the day of surgery will not be allowed to drive home.   Special instructions:   [place]- Preparing For Surgery  Before surgery, you can play an important role. Because skin is not sterile, your skin needs to be as free  of germs as possible. You can reduce the number of germs on your skin by washing with CHG (chlorahexidine gluconate) Soap before surgery.  CHG is an antiseptic cleaner which kills germs and bonds with the skin to continue killing germs even after washing.  Please do not use if you have an allergy to CHG or antibacterial soaps. If your skin becomes reddened/irritated stop using the CHG.  Do not shave (including legs and underarms) for at least 48 hours prior to first CHG shower. It is OK to shave your face.  Please follow these instructions carefully.   1. Shower the NIGHT BEFORE SURGERY and the MORNING OF SURGERY with CHG.   2. If you chose to wash your hair, wash your hair first as usual with your normal shampoo.  3. After you shampoo, rinse your hair and body thoroughly to remove the shampoo.  4. Use CHG as you would any other liquid soap. You can apply CHG directly to the skin and wash gently with a scrungie or a clean washcloth.   5. Apply the CHG Soap to your body ONLY FROM THE NECK DOWN.  Do not use on open wounds or open sores. Avoid contact with your eyes, ears, mouth and genitals (private parts). Wash Face and genitals (private parts)  with your normal soap.  6. Wash thoroughly, paying special attention to the area where your surgery will be performed.  7. Thoroughly rinse your body with warm water from the neck down.  8. DO NOT shower/wash with your normal soap after using and rinsing off the CHG Soap.  9. Pat yourself dry with a CLEAN TOWEL.  10. Wear CLEAN PAJAMAS to bed the night before surgery, wear comfortable clothes the morning of surgery  11. Place CLEAN SHEETS on your bed the night of your first shower and DO NOT SLEEP WITH PETS.    Day of Surgery: Do not apply any deodorants/lotions. Please wear clean clothes to the hospital/surgery center.      Please read over the following fact sheets that you were given.

## 2017-11-08 ENCOUNTER — Other Ambulatory Visit: Payer: Self-pay

## 2017-11-08 ENCOUNTER — Encounter (HOSPITAL_COMMUNITY): Payer: Self-pay

## 2017-11-08 ENCOUNTER — Encounter (HOSPITAL_COMMUNITY)
Admission: RE | Admit: 2017-11-08 | Discharge: 2017-11-08 | Disposition: A | Discharge status: Home / Self Care | Payer: Medicare Other | Source: Ambulatory Visit | Attending: Orthopedic Surgery | Admitting: Orthopedic Surgery

## 2017-11-08 DIAGNOSIS — Z01812 Encounter for preprocedural laboratory examination: Secondary | ICD-10-CM | POA: Insufficient documentation

## 2017-11-08 DIAGNOSIS — E119 Type 2 diabetes mellitus without complications: Secondary | ICD-10-CM | POA: Diagnosis not present

## 2017-11-08 HISTORY — DX: Chronic kidney disease, unspecified: N18.9

## 2017-11-08 HISTORY — DX: Unspecified hearing loss, unspecified ear: H91.90

## 2017-11-08 LAB — CBC
HEMATOCRIT: 36.4 % — AB (ref 39.0–52.0)
HEMOGLOBIN: 11.6 g/dL — AB (ref 13.0–17.0)
MCH: 30.4 pg (ref 26.0–34.0)
MCHC: 31.9 g/dL (ref 30.0–36.0)
MCV: 95.3 fL (ref 78.0–100.0)
Platelets: 298 10*3/uL (ref 150–400)
RBC: 3.82 MIL/uL — AB (ref 4.22–5.81)
RDW: 16.1 % — ABNORMAL HIGH (ref 11.5–15.5)
WBC: 4.8 10*3/uL (ref 4.0–10.5)

## 2017-11-08 LAB — BASIC METABOLIC PANEL
ANION GAP: 13 (ref 5–15)
BUN: 23 mg/dL — ABNORMAL HIGH (ref 6–20)
CHLORIDE: 107 mmol/L (ref 101–111)
CO2: 22 mmol/L (ref 22–32)
CREATININE: 1.66 mg/dL — AB (ref 0.61–1.24)
Calcium: 9.2 mg/dL (ref 8.9–10.3)
GFR calc non Af Amer: 37 mL/min — ABNORMAL LOW (ref >60)
GFR, EST AFRICAN AMERICAN: 43 mL/min — AB (ref >60)
Glucose, Bld: 114 mg/dL — ABNORMAL HIGH (ref 65–99)
Potassium: 4.6 mmol/L (ref 3.5–5.1)
SODIUM: 142 mmol/L (ref 135–145)

## 2017-11-08 LAB — SURGICAL PCR SCREEN
MRSA, PCR: NEGATIVE
STAPHYLOCOCCUS AUREUS: NEGATIVE

## 2017-11-08 LAB — GLUCOSE, CAPILLARY: Glucose-Capillary: 116 mg/dL — ABNORMAL HIGH (ref 65–99)

## 2017-11-08 NOTE — Progress Notes (Addendum)
PCP is Dr. Aviva Signs [place] LOV 10/31/2017 Hematologist is Dr. Marin Olp -- pt has hemachromotosis  LOV 09/16/2017 Pain Management MD is Dr. Suella Broad  [place] 08/2017 Rheumatologist is Dr. Amil Amen  LOV 10/04/2017   Has history of clots in both lungs and leg 06/2009. Stopping his Elioquis on the 12th. (2 days prior to) Stopping his aspirin 5 days. Last dose on Sund, 2/10 Stopping his Methotrexate 1 week prior to surgery. Only checks his sugar every other day.  Runs 95-135 Last A1C 11/06/1017  6.6

## 2017-11-08 NOTE — Pre-Procedure Instructions (Signed)
[name]  11/08/2017      [facility] - [address], [place] [address] Phone: [phone] Fax: [phone]  [facility] [place], [place] [address] [place] [address] Phone: [phone] Fax: [phone]    Your procedure is scheduled on, Friday,  November 15, 2017.   Report to [facility] Admitting at 08:00 A.M.             (posted surgery time 10a - 12 noon)   Call this number if you have problems the morning of surgery:  [phone]   Remember:   Do not eat food or drink liquids after midnight, Thursday.   Take these medicines the morning of surgery with A SIP OF WATER :  Levothyroxine (Synthroid) Hydrocodone-Acetaminophen (Norco) pain pill if needed Flonase nasal spray if needed Alprazolam (Xanax) if needed Pantoprazole (Protonix)  Follow your Doctor's instructions regarding your Eliquis.  Stopping on ________________________  7 days prior to surgery STOP taking any Aspirin (unless otherwise instructed by your surgeon), Aleve, Naproxen, Ibuprofen, Motrin, Advil, Goody's, BC's, all herbal medications, fish oil, and all vitamins.   Do not wear jewelry.  Do not wear lotions, powders, or perfumes, or deodorant.  Do not shave 48 hours prior to surgery.  Men may shave face and neck.  Do not bring valuables to the hospital.  [facility] is not responsible for any belongings or valuables.  Contacts, dentures or bridgework may not be worn into surgery.  Leave your suitcase in the car.  After surgery it may be brought to your room.  For patients admitted to the hospital, discharge time will be determined by your treatment team.  Patients discharged the day of surgery will not be allowed to drive home.   Special instructions:   [place]- Preparing For Surgery  Before surgery, you can play an important role.  Because skin is not sterile, your skin needs to be as free of germs as possible. You can reduce the number of germs on your skin by washing with CHG (chlorahexidine gluconate) Soap before surgery.  CHG is an antiseptic cleaner which kills germs and bonds with the skin to continue killing germs even after washing.  Please do not use if you have an allergy to CHG or antibacterial soaps. If your skin becomes reddened/irritated stop using the CHG.  Do not shave (including legs and underarms) for at least 48 hours prior to first CHG shower. It is OK to shave your face.  Please follow these instructions carefully.   1. Shower the NIGHT BEFORE SURGERY and the MORNING OF SURGERY with CHG.   2. If you chose to wash your hair, wash your hair first as usual with your normal shampoo.  3. After you shampoo, rinse your hair and body thoroughly to remove the shampoo.  4. Use CHG as you would any other liquid soap. You can apply CHG directly to the skin and wash gently with a scrungie or a clean washcloth.   5. Apply the CHG Soap to your body ONLY FROM THE NECK DOWN.  Do not use on open wounds or open sores. Avoid contact with your eyes, ears, mouth and genitals (private parts). Wash Face and genitals (private parts)  with your normal soap.  6. Wash thoroughly, paying special attention to the area where your surgery will  be performed.  7. Thoroughly rinse your body with warm water from the neck down.  8. DO NOT shower/wash with your normal soap after using and rinsing off the CHG Soap.  9. Pat yourself dry with a CLEAN TOWEL.  10. Wear CLEAN PAJAMAS to bed the night before surgery, wear comfortable clothes the morning of surgery  11. Place CLEAN SHEETS on your bed the night of your first shower and DO NOT SLEEP WITH PETS.  Day of Surgery: Do not apply any deodorants/lotions. Please wear clean clothes to the hospital/surgery center.           How to Manage Your Diabetes Before and After  Surgery  Why is it important to control my blood sugar before and after surgery? . Improving blood sugar levels before and after surgery helps healing and can limit problems. . A way of improving blood sugar control is eating a healthy diet by: o  Eating less sugar and carbohydrates o  Increasing activity/exercise o  Talking with your doctor about reaching your blood sugar goals . High blood sugars (greater than 180 mg/dL) can raise your risk of infections and slow your recovery, so you will need to focus on controlling your diabetes during the weeks before surgery. . Make sure that the doctor who takes care of your diabetes knows about your planned surgery including the date and location.  How do I manage my blood sugar before surgery? . Check your blood sugar at least 4 times a day, starting 2 days before surgery, to make sure that the level is not too high or low. o Check your blood sugar the morning of your surgery when you wake up and every 2 hours until you get to the Short Stay unit. o  . If your blood sugar is less than 70 mg/dL, you will need to treat for low blood sugar: o Do not take insulin. o Treat a low blood sugar (less than 70 mg/dL) with  cup of clear juice (cranberry or apple), 4 glucose tablets, OR glucose gel.  DO NOT DRINK ORANGE JUICE. o  Recheck blood sugar in 15 minutes after treatment (to make sure it is greater than 70 mg/dL). If your blood sugar is not greater than 70 mg/dL on recheck, call [phone] o  for further instructions. . Report your blood sugar to the short stay nurse when you get to Short Stay.  . If you are admitted to the hospital after surgery: o Your blood sugar will be checked by the staff and you will probably be given insulin after surgery (instead of oral diabetes medicines) to make sure you have good blood sugar levels. o The goal for blood sugar control after surgery is 80-180 mg/dL.   DO NOT take any diabetes medication, Pioglitazone  (ACTOS), the morning of surgery.

## 2017-11-11 NOTE — Progress Notes (Signed)
Anesthesia Chart Review: Patient is a 82 year old male scheduled for right reverse shoulder arthroplasty on 11/15/17 by Dr. Netta Cedars.   History includes former smoker (quit '82), HTN, HLD, recurrent PE (small RLL 08/17/14, bilateral PE with right heart strain 06/18/17) and RLE DVT (06/19/17), hemochromatosis, anemia, hypothyroidism, IBS, reflux esophagitis, GERD, RA, DM2, CKD stage III, hard of hearing, appendectomy, T&A, left reverse shoulder replacement '12, left TKA '10, right TKA '12, L3-5 laminotomies/foraminotomies 07/30/16.  - Hospitalized 10/13/17-10/16/17 for small bowel obstruction (versus ileus) treated with NG tube, IVF, NPO. Also reported bloody BM at home (last colonoscopy 2017). ASA held. He was tolerating soft diet by discharge. Decreasing opiates and getting on bowel regimen recommended. Renal function felt stable.   - PCP is Dr. Roma Schanz. She cleared for surgery with recommendation for rheumatology and hematology clearance. Last office visit 10/31/17. - Hematologist is Dr. Burney Gauze.  He recommended stopping Eliquis 2 days before surgery and restart day after surgery. He also gave permission to hold ASA 5 days prior to surgery.  - Rheumatologist is Dr. Leigh Aurora. He gave recommendation to hold methotrexate 1 week prior to in 1 week after surgery. - Pain Management is with Dr. Suella Broad.   Meds include Xanax, Lipitor, Eliquis, fenofibrate, Flonase, folic acid, Norco, levothyroxine, Robaxin, methotrexate, Zofran, Protonix, Actos. He was on ASA 81 mg daily, but held during 10/16/17 hospitalization. Eliquis held for 2 days prior to surgery, methotrexate for 1 week prior to surgery.   BP 136/85   Pulse 64   Temp 36.8 C   Resp 18   Ht 5' 11.5" (1.816 m)   Wt 220 lb 11.2 oz (100.1 kg)   SpO2 96%   BMI 30.35 kg/m   EKG 10/13/17: SR, PAC.  Echo 06/18/17 (in the setting of acute bilateral PE with right heart strain): Study Conclusions - Left ventricle: The cavity  size was normal. Wall thickness was   increased in a pattern of mild LVH. Systolic function was normal.   The estimated ejection fraction was in the range of 55% to 60%.   Wall motion was normal; there were no regional wall motion   abnormalities. Doppler parameters are consistent with abnormal   left ventricular relaxation (grade 1 diastolic dysfunction). - Aortic valve: There was trivial regurgitation. - Mitral valve: There was mild regurgitation. - Pulmonary arteries: Systolic pressure was moderately increased.   PA peak pressure: 53 mm Hg (S). Impressions: - Normal LV systolic function; mild diastolic dysfunction; mild   LVH; trace AI; mild MR and mild TR; moderately elevated pulmonary   pressure.  Nuclear stress test 06/09/12 (ordered by Dr. Birdena Crandall for evaluation for chest pain): Overall Impression:  Normal stress nuclear study. LV Ejection Fraction: 67%.  LV Wall Motion:  NL LV Function; NL Wall Motion  RLE venous U/S 09/16/17: IMPRESSION: 1. Chronic mural thrombus in the popliteal vein which is not causing significant luminal narrowing. 2. Chronic occlusive thrombus in the posterior tibial vein of the proximal calf. 3. Chronic shadowing calcified thrombus in the short saphenous vein in the proximal calf.  CTA chest 09/16/17: IMPRESSION: 1. No acute pulmonary embolism. 2. Persistent chronic thromboemboli in the pulmonary arteries bilaterally, all decreased in size since 06/18/2017 . 3. Stable dilated main pulmonary artery (3.5 cm diameter), suggesting chronic pulmonary arterial hypertension . 4. Coronary atherosclerosis. 5. No acute pulmonary disease. 6. Moderate centrilobular and paraseptal emphysema with mild diffuse bronchial wall thickening, suggesting COPD. Aortic Atherosclerosis (ICD10-I70.0) and Emphysema (ICD10-J43.9).  Preoperative labs noted. BUN 23, Cr 1.66 (Cr 1.46-1.90 since 12/2016). H/H 11.6/36.4. PLT 298. AST/ALT WNL 10/31/17. A1c 6.6 on 11/06/17.   He  has PCP, otology, and rheumatology preoperative input.  Renal function appears stable. If no acute changes then I anticipate that he can proceed as planned.  [facility] Short Stay Center/Anesthesiology Phone [phone] 11/12/2017 9:45 AM

## 2017-11-12 ENCOUNTER — Encounter (HOSPITAL_COMMUNITY): Payer: Self-pay

## 2017-11-15 ENCOUNTER — Inpatient Hospital Stay (HOSPITAL_COMMUNITY)
Admission: RE | Admit: 2017-11-15 | Discharge: 2017-11-16 | DRG: 483 | Disposition: A | Discharge status: Home / Self Care | Payer: Medicare Other | Source: Ambulatory Visit | Attending: Orthopedic Surgery | Admitting: Orthopedic Surgery

## 2017-11-15 ENCOUNTER — Encounter (HOSPITAL_COMMUNITY): Payer: Self-pay | Admitting: Anesthesiology

## 2017-11-15 ENCOUNTER — Inpatient Hospital Stay (HOSPITAL_COMMUNITY): Payer: Medicare Other

## 2017-11-15 ENCOUNTER — Encounter (HOSPITAL_COMMUNITY)
Admission: RE | Disposition: A | Discharge status: Home / Self Care | Payer: Self-pay | Source: Ambulatory Visit | Attending: Orthopedic Surgery

## 2017-11-15 ENCOUNTER — Inpatient Hospital Stay (HOSPITAL_COMMUNITY): Payer: Medicare Other | Admitting: Anesthesiology

## 2017-11-15 ENCOUNTER — Inpatient Hospital Stay (HOSPITAL_COMMUNITY): Payer: Medicare Other | Admitting: Vascular Surgery

## 2017-11-15 DIAGNOSIS — Z79899 Other long term (current) drug therapy: Secondary | ICD-10-CM

## 2017-11-15 DIAGNOSIS — Z9842 Cataract extraction status, left eye: Secondary | ICD-10-CM | POA: Diagnosis not present

## 2017-11-15 DIAGNOSIS — Z79890 Hormone replacement therapy (postmenopausal): Secondary | ICD-10-CM | POA: Diagnosis not present

## 2017-11-15 DIAGNOSIS — I129 Hypertensive chronic kidney disease with stage 1 through stage 4 chronic kidney disease, or unspecified chronic kidney disease: Secondary | ICD-10-CM | POA: Diagnosis present

## 2017-11-15 DIAGNOSIS — Z7901 Long term (current) use of anticoagulants: Secondary | ICD-10-CM

## 2017-11-15 DIAGNOSIS — Z96611 Presence of right artificial shoulder joint: Secondary | ICD-10-CM | POA: Diagnosis not present

## 2017-11-15 DIAGNOSIS — Z888 Allergy status to other drugs, medicaments and biological substances status: Secondary | ICD-10-CM | POA: Diagnosis not present

## 2017-11-15 DIAGNOSIS — M75101 Unspecified rotator cuff tear or rupture of right shoulder, not specified as traumatic: Secondary | ICD-10-CM | POA: Diagnosis present

## 2017-11-15 DIAGNOSIS — M19011 Primary osteoarthritis, right shoulder: Secondary | ICD-10-CM | POA: Diagnosis present

## 2017-11-15 DIAGNOSIS — N189 Chronic kidney disease, unspecified: Secondary | ICD-10-CM | POA: Diagnosis present

## 2017-11-15 DIAGNOSIS — Z91018 Allergy to other foods: Secondary | ICD-10-CM | POA: Diagnosis not present

## 2017-11-15 DIAGNOSIS — Z471 Aftercare following joint replacement surgery: Secondary | ICD-10-CM | POA: Diagnosis not present

## 2017-11-15 DIAGNOSIS — Z96612 Presence of left artificial shoulder joint: Secondary | ICD-10-CM | POA: Diagnosis present

## 2017-11-15 DIAGNOSIS — H905 Unspecified sensorineural hearing loss: Secondary | ICD-10-CM | POA: Diagnosis present

## 2017-11-15 DIAGNOSIS — Z87442 Personal history of urinary calculi: Secondary | ICD-10-CM | POA: Diagnosis not present

## 2017-11-15 DIAGNOSIS — E785 Hyperlipidemia, unspecified: Secondary | ICD-10-CM | POA: Diagnosis present

## 2017-11-15 DIAGNOSIS — Z87891 Personal history of nicotine dependence: Secondary | ICD-10-CM | POA: Diagnosis not present

## 2017-11-15 DIAGNOSIS — E1122 Type 2 diabetes mellitus with diabetic chronic kidney disease: Secondary | ICD-10-CM | POA: Diagnosis present

## 2017-11-15 DIAGNOSIS — K210 Gastro-esophageal reflux disease with esophagitis: Secondary | ICD-10-CM | POA: Diagnosis present

## 2017-11-15 DIAGNOSIS — Z9841 Cataract extraction status, right eye: Secondary | ICD-10-CM

## 2017-11-15 DIAGNOSIS — Z86711 Personal history of pulmonary embolism: Secondary | ICD-10-CM | POA: Diagnosis not present

## 2017-11-15 DIAGNOSIS — E039 Hypothyroidism, unspecified: Secondary | ICD-10-CM | POA: Diagnosis present

## 2017-11-15 DIAGNOSIS — G8918 Other acute postprocedural pain: Secondary | ICD-10-CM | POA: Diagnosis not present

## 2017-11-15 DIAGNOSIS — I10 Essential (primary) hypertension: Secondary | ICD-10-CM | POA: Diagnosis not present

## 2017-11-15 DIAGNOSIS — K589 Irritable bowel syndrome without diarrhea: Secondary | ICD-10-CM | POA: Diagnosis present

## 2017-11-15 DIAGNOSIS — Z96653 Presence of artificial knee joint, bilateral: Secondary | ICD-10-CM | POA: Diagnosis present

## 2017-11-15 DIAGNOSIS — Z86010 Personal history of colonic polyps: Secondary | ICD-10-CM | POA: Diagnosis not present

## 2017-11-15 DIAGNOSIS — Z91013 Allergy to seafood: Secondary | ICD-10-CM | POA: Diagnosis not present

## 2017-11-15 DIAGNOSIS — Z79891 Long term (current) use of opiate analgesic: Secondary | ICD-10-CM

## 2017-11-15 DIAGNOSIS — M12811 Other specific arthropathies, not elsewhere classified, right shoulder: Secondary | ICD-10-CM | POA: Diagnosis not present

## 2017-11-15 HISTORY — PX: REVERSE SHOULDER ARTHROPLASTY: SHX5054

## 2017-11-15 LAB — GLUCOSE, CAPILLARY
GLUCOSE-CAPILLARY: 121 mg/dL — AB (ref 65–99)
GLUCOSE-CAPILLARY: 119 mg/dL — AB (ref 65–99)
GLUCOSE-CAPILLARY: 108 mg/dL — AB (ref 65–99)
GLUCOSE-CAPILLARY: 117 mg/dL — AB (ref 65–99)
Glucose-Capillary: 100 mg/dL — ABNORMAL HIGH (ref 65–99)

## 2017-11-15 SURGERY — ARTHROPLASTY, SHOULDER, TOTAL, REVERSE
Anesthesia: General | Site: Shoulder | Laterality: Right

## 2017-11-15 MED ORDER — CALCIUM CARBONATE-VITAMIN D 500-200 MG-UNIT PO TABS
1.0000 | ORAL_TABLET | Freq: Every day | ORAL | Status: DC
  Administered 2017-11-16: 1 via ORAL
  Filled 2017-11-15 (×3): qty 1

## 2017-11-15 MED ORDER — SUGAMMADEX SODIUM 200 MG/2ML IV SOLN
INTRAVENOUS | Status: DC | PRN
Start: 2017-11-15 — End: 2017-11-15
  Administered 2017-11-15: 200 mg via INTRAVENOUS

## 2017-11-15 MED ORDER — ONDANSETRON HCL 4 MG PO TABS: 4.0000 mg | ORAL_TABLET | Freq: Four times a day (QID) | ORAL | Status: DC | PRN

## 2017-11-15 MED ORDER — METOCLOPRAMIDE HCL 5 MG PO TABS: 5.0000 mg | ORAL_TABLET | Freq: Three times a day (TID) | ORAL | Status: DC | PRN

## 2017-11-15 MED ORDER — SUGAMMADEX SODIUM 200 MG/2ML IV SOLN
INTRAVENOUS | Status: AC
  Filled 2017-11-15: qty 2

## 2017-11-15 MED ORDER — ATORVASTATIN CALCIUM 20 MG PO TABS
20.0000 mg | ORAL_TABLET | Freq: Every day | ORAL | Status: DC
  Administered 2017-11-15: 20 mg via ORAL
  Filled 2017-11-15: qty 1

## 2017-11-15 MED ORDER — PANTOPRAZOLE SODIUM 40 MG PO TBEC
40.0000 mg | DELAYED_RELEASE_TABLET | Freq: Every day | ORAL | Status: DC
  Administered 2017-11-16: 40 mg via ORAL
  Filled 2017-11-15: qty 1

## 2017-11-15 MED ORDER — MEPERIDINE HCL 50 MG/ML IJ SOLN
INTRAMUSCULAR | Status: AC
  Administered 2017-11-15: 6.25 mg via INTRAVENOUS
  Filled 2017-11-15: qty 1

## 2017-11-15 MED ORDER — FENTANYL CITRATE (PF) 100 MCG/2ML IJ SOLN
25.0000 ug | INTRAMUSCULAR | Status: DC | PRN
  Administered 2017-11-15 (×4): 50 ug via INTRAVENOUS

## 2017-11-15 MED ORDER — METHOTREXATE 2.5 MG PO TABS: 20.0000 mg | ORAL_TABLET | ORAL | Status: DC

## 2017-11-15 MED ORDER — INSULIN ASPART 100 UNIT/ML ~~LOC~~ SOLN: 0.0000 [IU] | Freq: Three times a day (TID) | SUBCUTANEOUS | Status: DC

## 2017-11-15 MED ORDER — FENTANYL CITRATE (PF) 250 MCG/5ML IJ SOLN
INTRAMUSCULAR | Status: AC
  Filled 2017-11-15: qty 5

## 2017-11-15 MED ORDER — METHOCARBAMOL 500 MG PO TABS: 500.0000 mg | ORAL_TABLET | Freq: Four times a day (QID) | ORAL | 3 refills | Status: AC | PRN

## 2017-11-15 MED ORDER — ONDANSETRON HCL 4 MG/2ML IJ SOLN
INTRAMUSCULAR | Status: DC | PRN
  Administered 2017-11-15: 4 mg via INTRAVENOUS

## 2017-11-15 MED ORDER — FENTANYL CITRATE (PF) 100 MCG/2ML IJ SOLN
INTRAMUSCULAR | Status: AC
  Administered 2017-11-15: 50 ug via INTRAVENOUS
  Filled 2017-11-15: qty 2

## 2017-11-15 MED ORDER — LEVOTHYROXINE SODIUM 75 MCG PO TABS
125.0000 ug | ORAL_TABLET | Freq: Every day | ORAL | Status: DC
  Administered 2017-11-16: 06:00:00 125 ug via ORAL
  Filled 2017-11-15: qty 1

## 2017-11-15 MED ORDER — ACETAMINOPHEN 325 MG PO TABS: 650.0000 mg | ORAL_TABLET | ORAL | Status: DC | PRN

## 2017-11-15 MED ORDER — MIDAZOLAM HCL 2 MG/2ML IJ SOLN
INTRAMUSCULAR | Status: AC
  Administered 2017-11-15: 2 mg
  Filled 2017-11-15: qty 2

## 2017-11-15 MED ORDER — ADULT MULTIVITAMIN W/MINERALS CH
1.0000 | ORAL_TABLET | Freq: Every day | ORAL | Status: DC
  Administered 2017-11-16: 1 via ORAL
  Filled 2017-11-15 (×3): qty 1

## 2017-11-15 MED ORDER — CEFAZOLIN SODIUM-DEXTROSE 2-4 GM/100ML-% IV SOLN
2.0000 g | Freq: Four times a day (QID) | INTRAVENOUS | Status: AC
  Administered 2017-11-15 – 2017-11-16 (×3): 2 g via INTRAVENOUS
  Filled 2017-11-15 (×3): qty 100

## 2017-11-15 MED ORDER — HYDROCORTISONE 2.5 % RE CREA
TOPICAL_CREAM | Freq: Three times a day (TID) | RECTAL | Status: DC | PRN
  Filled 2017-11-15: qty 28.35

## 2017-11-15 MED ORDER — ALPRAZOLAM 0.5 MG PO TABS
0.5000 mg | ORAL_TABLET | Freq: Every evening | ORAL | Status: DC | PRN
  Administered 2017-11-15: 0.5 mg via ORAL
  Filled 2017-11-15: qty 1

## 2017-11-15 MED ORDER — LIDOCAINE 2% (20 MG/ML) 5 ML SYRINGE
INTRAMUSCULAR | Status: AC
  Filled 2017-11-15: qty 5

## 2017-11-15 MED ORDER — ONDANSETRON HCL 4 MG/2ML IJ SOLN
INTRAMUSCULAR | Status: AC
  Filled 2017-11-15: qty 2

## 2017-11-15 MED ORDER — FOLIC ACID 1 MG PO TABS
1.0000 mg | ORAL_TABLET | Freq: Every day | ORAL | Status: DC
  Administered 2017-11-16: 1 mg via ORAL
  Filled 2017-11-15: qty 1

## 2017-11-15 MED ORDER — POLYETHYLENE GLYCOL 3350 17 G PO PACK: 17.0000 g | PACK | Freq: Every day | ORAL | Status: DC | PRN

## 2017-11-15 MED ORDER — FLUTICASONE PROPIONATE 50 MCG/ACT NA SUSP
2.0000 | Freq: Every day | NASAL | Status: DC | PRN
  Filled 2017-11-15: qty 16

## 2017-11-15 MED ORDER — PROPOFOL 10 MG/ML IV BOLUS
INTRAVENOUS | Status: AC
  Filled 2017-11-15: qty 20

## 2017-11-15 MED ORDER — 0.9 % SODIUM CHLORIDE (POUR BTL) OPTIME
TOPICAL | Status: DC | PRN
  Administered 2017-11-15 (×2): 1000 mL

## 2017-11-15 MED ORDER — PHENOL 1.4 % MT LIQD: 1.0000 | OROMUCOSAL | Status: DC | PRN

## 2017-11-15 MED ORDER — PROPOFOL 10 MG/ML IV BOLUS
INTRAVENOUS | Status: DC | PRN
  Administered 2017-11-15: 140 mg via INTRAVENOUS

## 2017-11-15 MED ORDER — LIDOCAINE HCL (CARDIAC) 20 MG/ML IV SOLN
INTRAVENOUS | Status: DC | PRN
  Administered 2017-11-15: 100 mg via INTRAVENOUS

## 2017-11-15 MED ORDER — ROPIVACAINE HCL 7.5 MG/ML IJ SOLN
INTRAMUSCULAR | Status: DC | PRN
  Administered 2017-11-15: 20 mL via PERINEURAL

## 2017-11-15 MED ORDER — MEPERIDINE HCL 50 MG/ML IJ SOLN
6.2500 mg | INTRAMUSCULAR | Status: DC | PRN
  Administered 2017-11-15: 6.25 mg via INTRAVENOUS

## 2017-11-15 MED ORDER — CHLORHEXIDINE GLUCONATE 4 % EX LIQD: 60.0000 mL | Freq: Once | CUTANEOUS | Status: DC

## 2017-11-15 MED ORDER — CEFAZOLIN SODIUM-DEXTROSE 2-4 GM/100ML-% IV SOLN
2.0000 g | INTRAVENOUS | Status: AC
  Administered 2017-11-15: 2 g via INTRAVENOUS

## 2017-11-15 MED ORDER — DOCUSATE SODIUM 100 MG PO CAPS
100.0000 mg | ORAL_CAPSULE | Freq: Two times a day (BID) | ORAL | Status: DC
  Administered 2017-11-16: 100 mg via ORAL
  Filled 2017-11-15 (×2): qty 1

## 2017-11-15 MED ORDER — GLYCOPYRROLATE 0.2 MG/ML IJ SOLN
INTRAMUSCULAR | Status: DC | PRN
  Administered 2017-11-15: 0.2 mg via INTRAVENOUS

## 2017-11-15 MED ORDER — METOCLOPRAMIDE HCL 5 MG/ML IJ SOLN: 10.0000 mg | Freq: Once | INTRAMUSCULAR | Status: DC | PRN

## 2017-11-15 MED ORDER — MENTHOL 3 MG MT LOZG: 1.0000 | LOZENGE | OROMUCOSAL | Status: DC | PRN

## 2017-11-15 MED ORDER — HYDROCODONE-ACETAMINOPHEN 10-325 MG PO TABS
1.0000 | ORAL_TABLET | ORAL | Status: DC | PRN
  Administered 2017-11-15 – 2017-11-16 (×4): 1 via ORAL
  Filled 2017-11-15 (×4): qty 1

## 2017-11-15 MED ORDER — ONDANSETRON HCL 4 MG PO TABS: 4.0000 mg | ORAL_TABLET | Freq: Three times a day (TID) | ORAL | Status: DC | PRN

## 2017-11-15 MED ORDER — BUPIVACAINE-EPINEPHRINE 0.25% -1:200000 IJ SOLN
INTRAMUSCULAR | Status: AC
  Filled 2017-11-15: qty 1

## 2017-11-15 MED ORDER — BUPIVACAINE-EPINEPHRINE 0.25% -1:200000 IJ SOLN
INTRAMUSCULAR | Status: DC | PRN
  Administered 2017-11-15: 9 mL

## 2017-11-15 MED ORDER — METOCLOPRAMIDE HCL 5 MG/ML IJ SOLN: 5.0000 mg | Freq: Three times a day (TID) | INTRAMUSCULAR | Status: DC | PRN

## 2017-11-15 MED ORDER — ROCURONIUM BROMIDE 100 MG/10ML IV SOLN
INTRAVENOUS | Status: DC | PRN
  Administered 2017-11-15: 40 mg via INTRAVENOUS

## 2017-11-15 MED ORDER — SODIUM CHLORIDE 0.9 % IV SOLN: INTRAVENOUS | Status: DC

## 2017-11-15 MED ORDER — MORPHINE SULFATE (PF) 4 MG/ML IV SOLN
2.0000 mg | INTRAVENOUS | Status: DC | PRN
  Administered 2017-11-15 – 2017-11-16 (×3): 3 mg via INTRAVENOUS
  Administered 2017-11-16: 2 mg via INTRAVENOUS
  Filled 2017-11-15 (×4): qty 1

## 2017-11-15 MED ORDER — EPHEDRINE SULFATE-NACL 50-0.9 MG/10ML-% IV SOSY
PREFILLED_SYRINGE | INTRAVENOUS | Status: DC | PRN
  Administered 2017-11-15: 10 mg via INTRAVENOUS

## 2017-11-15 MED ORDER — ACETAMINOPHEN 650 MG RE SUPP: 650.0000 mg | RECTAL | Status: DC | PRN

## 2017-11-15 MED ORDER — EPHEDRINE 5 MG/ML INJ
INTRAVENOUS | Status: AC
  Filled 2017-11-15: qty 10

## 2017-11-15 MED ORDER — ONDANSETRON HCL 4 MG/2ML IJ SOLN
4.0000 mg | Freq: Four times a day (QID) | INTRAMUSCULAR | Status: DC | PRN
  Administered 2017-11-16: 4 mg via INTRAVENOUS
  Filled 2017-11-15 (×2): qty 2

## 2017-11-15 MED ORDER — INSULIN ASPART 100 UNIT/ML ~~LOC~~ SOLN: 4.0000 [IU] | Freq: Three times a day (TID) | SUBCUTANEOUS | Status: DC

## 2017-11-15 MED ORDER — PIOGLITAZONE HCL 15 MG PO TABS
15.0000 mg | ORAL_TABLET | Freq: Every day | ORAL | Status: DC
  Administered 2017-11-15 – 2017-11-16 (×2): 15 mg via ORAL
  Filled 2017-11-15 (×2): qty 1

## 2017-11-15 MED ORDER — METHOCARBAMOL 500 MG PO TABS
500.0000 mg | ORAL_TABLET | Freq: Four times a day (QID) | ORAL | Status: DC | PRN
  Administered 2017-11-15 – 2017-11-16 (×2): 500 mg via ORAL
  Filled 2017-11-15 (×2): qty 1

## 2017-11-15 MED ORDER — APIXABAN 5 MG PO TABS: 5.0000 mg | ORAL_TABLET | Freq: Two times a day (BID) | ORAL | Status: DC

## 2017-11-15 MED ORDER — FENTANYL CITRATE (PF) 100 MCG/2ML IJ SOLN
INTRAMUSCULAR | Status: AC
  Filled 2017-11-15: qty 2

## 2017-11-15 MED ORDER — INSULIN ASPART 100 UNIT/ML ~~LOC~~ SOLN: 0.0000 [IU] | Freq: Every day | SUBCUTANEOUS | Status: DC

## 2017-11-15 MED ORDER — FENTANYL CITRATE (PF) 100 MCG/2ML IJ SOLN
INTRAMUSCULAR | Status: DC | PRN
  Administered 2017-11-15: 50 ug via INTRAVENOUS

## 2017-11-15 MED ORDER — HYDROCODONE-ACETAMINOPHEN 10-325 MG PO TABS: 1.0000 | ORAL_TABLET | ORAL | 0 refills | Status: AC | PRN

## 2017-11-15 MED ORDER — FENOFIBRATE 160 MG PO TABS
160.0000 mg | ORAL_TABLET | Freq: Every day | ORAL | Status: DC
  Administered 2017-11-16: 160 mg via ORAL
  Filled 2017-11-15: qty 1

## 2017-11-15 MED ORDER — INSULIN ASPART 100 UNIT/ML ~~LOC~~ SOLN
0.0000 [IU] | Freq: Three times a day (TID) | SUBCUTANEOUS | Status: DC
  Administered 2017-11-16: 3 [IU] via SUBCUTANEOUS

## 2017-11-15 MED ORDER — VITAMIN B-12 1000 MCG PO TABS
1000.0000 ug | ORAL_TABLET | Freq: Every day | ORAL | Status: DC
  Administered 2017-11-16: 1000 ug via ORAL
  Filled 2017-11-15 (×2): qty 1

## 2017-11-15 MED ORDER — PHENYLEPHRINE HCL 10 MG/ML IJ SOLN
INTRAVENOUS | Status: DC | PRN
  Administered 2017-11-15: 25 ug/min via INTRAVENOUS

## 2017-11-15 MED ORDER — CEFAZOLIN SODIUM-DEXTROSE 2-4 GM/100ML-% IV SOLN
INTRAVENOUS | Status: AC
  Filled 2017-11-15: qty 100

## 2017-11-15 MED ORDER — LACTATED RINGERS IV SOLN
INTRAVENOUS | Status: DC
  Administered 2017-11-15: 09:00:00 via INTRAVENOUS

## 2017-11-15 SURGICAL SUPPLY — 70 items
BIT DRILL 5/64X5 DISP (BIT) ×3 IMPLANT
BIT DRILL F/CENTRAL SCRW 3.2 (BIT)
BIT DRILL F/CENTRAL SCRW 3.2MM (BIT) IMPLANT
BIT DRILL TWIST 2.7 (BIT) IMPLANT
BIT DRILL TWIST 2.7MM (BIT)
BLADE SAG 18X100X1.27 (BLADE) ×3 IMPLANT
CAPT SHLDR REVTOTAL 2 ×2 IMPLANT
CLOSURE WOUND 1/2 X4 (GAUZE/BANDAGES/DRESSINGS) ×1
COVER SURGICAL LIGHT HANDLE (MISCELLANEOUS) ×3 IMPLANT
DRAPE INCISE IOBAN 66X45 STRL (DRAPES) ×3 IMPLANT
DRAPE ORTHO SPLIT 77X108 STRL (DRAPES) ×6
DRAPE SURG ORHT 6 SPLT 77X108 (DRAPES) ×2 IMPLANT
DRAPE U-SHAPE 47X51 STRL (DRAPES) ×3 IMPLANT
DRILL BIT F/CENTRAL SCRW 3.2MM (BIT)
DRSG ADAPTIC 3X8 NADH LF (GAUZE/BANDAGES/DRESSINGS) ×3 IMPLANT
DRSG PAD ABDOMINAL 8X10 ST (GAUZE/BANDAGES/DRESSINGS) ×5 IMPLANT
DURAPREP 26ML APPLICATOR (WOUND CARE) ×3 IMPLANT
ELECT BLADE 4.0 EZ CLEAN MEGAD (MISCELLANEOUS) ×3
ELECT NDL TIP 2.8 STRL (NEEDLE) ×1 IMPLANT
ELECT NEEDLE TIP 2.8 STRL (NEEDLE) ×3 IMPLANT
ELECT REM PT RETURN 9FT ADLT (ELECTROSURGICAL) ×3
ELECTRODE BLDE 4.0 EZ CLN MEGD (MISCELLANEOUS) ×1 IMPLANT
ELECTRODE REM PT RTRN 9FT ADLT (ELECTROSURGICAL) ×1 IMPLANT
GAUZE SPONGE 4X4 12PLY STRL (GAUZE/BANDAGES/DRESSINGS) ×3 IMPLANT
GLOVE BIOGEL PI IND STRL 7.0 (GLOVE) IMPLANT
GLOVE BIOGEL PI INDICATOR 7.0 (GLOVE) ×2
GLOVE BIOGEL PI ORTHO PRO 7.5 (GLOVE) ×2
GLOVE BIOGEL PI ORTHO PRO SZ8 (GLOVE) ×4
GLOVE ORTHO TXT STRL SZ7.5 (GLOVE) ×3 IMPLANT
GLOVE PI ORTHO PRO STRL 7.5 (GLOVE) ×1 IMPLANT
GLOVE PI ORTHO PRO STRL SZ8 (GLOVE) ×1 IMPLANT
GLOVE SS N UNI LF 7.0 STRL (GLOVE) ×2 IMPLANT
GLOVE SURG ORTHO 8.5 STRL (GLOVE) ×5 IMPLANT
GOWN STRL REUS W/ TWL LRG LVL3 (GOWN DISPOSABLE) ×1 IMPLANT
GOWN STRL REUS W/ TWL XL LVL3 (GOWN DISPOSABLE) ×2 IMPLANT
GOWN STRL REUS W/TWL LRG LVL3 (GOWN DISPOSABLE) ×3
GOWN STRL REUS W/TWL XL LVL3 (GOWN DISPOSABLE) ×12
KIT BASIN OR (CUSTOM PROCEDURE TRAY) ×3 IMPLANT
KIT ROOM TURNOVER OR (KITS) ×3 IMPLANT
MANIFOLD NEPTUNE II (INSTRUMENTS) ×3 IMPLANT
NDL 1/2 CIR MAYO (NEEDLE) ×1 IMPLANT
NDL 18GX1X1/2 (RX/OR ONLY) (NEEDLE) IMPLANT
NDL HYPO 25GX1X1/2 BEV (NEEDLE) ×1 IMPLANT
NEEDLE 1/2 CIR MAYO (NEEDLE) ×3 IMPLANT
NEEDLE 18GX1X1/2 (RX/OR ONLY) (NEEDLE) ×3 IMPLANT
NEEDLE HYPO 25GX1X1/2 BEV (NEEDLE) ×3 IMPLANT
NS IRRIG 1000ML POUR BTL (IV SOLUTION) ×5 IMPLANT
PACK SHOULDER (CUSTOM PROCEDURE TRAY) ×3 IMPLANT
PAD ARMBOARD 7.5X6 YLW CONV (MISCELLANEOUS) ×6 IMPLANT
PIN STEINMANN THREADED TIP (PIN) IMPLANT
SLING ARM FOAM STRAP LRG (SOFTGOODS) ×2 IMPLANT
SPONGE LAP 18X18 X RAY DECT (DISPOSABLE) ×2 IMPLANT
SPONGE LAP 4X18 X RAY DECT (DISPOSABLE) ×3 IMPLANT
STRIP CLOSURE SKIN 1/2X4 (GAUZE/BANDAGES/DRESSINGS) ×2 IMPLANT
SUCTION FRAZIER HANDLE 10FR (MISCELLANEOUS) ×2
SUCTION TUBE FRAZIER 10FR DISP (MISCELLANEOUS) ×1 IMPLANT
SUT FIBERWIRE #2 38 T-5 BLUE (SUTURE) ×12
SUT MNCRL AB 4-0 PS2 18 (SUTURE) ×3 IMPLANT
SUT VIC AB 0 CT2 27 (SUTURE) ×3 IMPLANT
SUT VIC AB 2-0 CT1 27 (SUTURE) ×3
SUT VIC AB 2-0 CT1 TAPERPNT 27 (SUTURE) ×1 IMPLANT
SUT VICRYL 0 CT 1 36IN (SUTURE) ×3 IMPLANT
SUTURE FIBERWR #2 38 T-5 BLUE (SUTURE) IMPLANT
SYR CONTROL 10ML LL (SYRINGE) ×3 IMPLANT
SYRINGE 20CC LL (MISCELLANEOUS) ×2 IMPLANT
TAPE CLOTH SURG 6X10 WHT LF (GAUZE/BANDAGES/DRESSINGS) ×2 IMPLANT
TOWEL OR 17X24 6PK STRL BLUE (TOWEL DISPOSABLE) ×1 IMPLANT
TOWEL OR 17X26 10 PK STRL BLUE (TOWEL DISPOSABLE) ×3 IMPLANT
TOWER CARTRIDGE SMART MIX (DISPOSABLE) IMPLANT
YANKAUER SUCT BULB TIP NO VENT (SUCTIONS) ×1 IMPLANT

## 2017-11-15 NOTE — Anesthesia Procedure Notes (Signed)
Procedure Name: Intubation Date/Time: 11/15/2017 9:48 AM Performed by: Kyung Rudd, CRNA Pre-anesthesia Checklist: Patient identified, Emergency Drugs available, Suction available and Patient being monitored Patient Re-evaluated:Patient Re-evaluated prior to induction Oxygen Delivery Method: Circle system utilized Preoxygenation: Pre-oxygenation with 100% oxygen Induction Type: IV induction Ventilation: Mask ventilation without difficulty and Oral airway inserted - appropriate to patient size Laryngoscope Size: Mac and 4 Grade View: Grade II Tube type: Oral Tube size: 7.5 mm Number of attempts: 1 Airway Equipment and Method: Stylet Placement Confirmation: ETT inserted through vocal cords under direct vision,  positive ETCO2 and breath sounds checked- equal and bilateral Secured at: 21 cm Tube secured with: Tape Dental Injury: Teeth and Oropharynx as per pre-operative assessment

## 2017-11-15 NOTE — Discharge Instructions (Signed)
Ice to the shoulder at all times.  Keep the incision clean and dry and covered for one week, then ok to get it wet in the shower.  Keep a pillow or folded blanket propped behind the right elbow to keep your arm across your waist, (Make sure that you can see your shoulder)  May remove sling while seated in the home. May use the right arm/hand for light daily activity and self care.  DO NOT push out of a chair.  Follow up in the office in two weeks with Dr Veverly Fells [phone]  ====================================================================================  Information on my medicine - ELIQUIS (apixaban)  Why was Eliquis prescribed for you? Eliquis was prescribed to treat blood clots that were previously found in the veins of your legs (deep vein thrombosis) or in your lungs (pulmonary embolism) and to reduce the risk of them occurring again.  What do You need to know about Eliquis ? The maintenance dose is ONE 5 mg tablet taken TWICE daily.  Eliquis may be taken with or without food.   Try to take the dose about the same time in the morning and in the evening. If you have difficulty swallowing the tablet whole please discuss with your pharmacist how to take the medication safely.  Take Eliquis exactly as prescribed and DO NOT stop taking Eliquis without talking to the doctor who prescribed the medication.  Stopping may increase your risk of developing a new blood clot.  Refill your prescription before you run out.  After discharge, you should have regular check-up appointments with your healthcare provider that is prescribing your Eliquis.    What do you do if you miss a dose? If a dose of ELIQUIS is not taken at the scheduled time, take it as soon as possible on the same day and twice-daily administration should be resumed. The dose should not be doubled to make up for a missed dose.  Important Safety Information A possible side effect of Eliquis is bleeding. You should  call your healthcare provider right away if you experience any of the following: ? Bleeding from an injury or your nose that does not stop. ? Unusual colored urine (red or dark brown) or unusual colored stools (red or black). ? Unusual bruising for unknown reasons. ? A serious fall or if you hit your head (even if there is no bleeding).  Some medicines may interact with Eliquis and might increase your risk of bleeding or clotting while on Eliquis. To help avoid this, consult your healthcare provider or pharmacist prior to using any new prescription or non-prescription medications, including herbals, vitamins, non-steroidal anti-inflammatory drugs (NSAIDs) and supplements.  This website has more information on Eliquis (apixaban): http://www.eliquis.com/eliquis/home

## 2017-11-15 NOTE — Interval H&P Note (Signed)
History and Physical Interval Note:  11/15/2017 9:19 AM  Louis [name].  has presented today for surgery, with the diagnosis of right shoulder rotator cuff tear arthropathy  The various methods of treatment have been discussed with the patient and family. After consideration of risks, benefits and other options for treatment, the patient has consented to  Procedure(s): RIGHT REVERSE SHOULDER ARTHROPLASTY (Right) as a surgical intervention .  The patient's history has been reviewed, patient examined, no change in status, stable for surgery.  I have reviewed the patient's chart and labs.  Questions were answered to the patient's satisfaction.     Louis [name],STEVEN R

## 2017-11-15 NOTE — Op Note (Signed)
NAME:  Louis [name], Louis [name]              ACCOUNT NO.:  [id number]  MEDICAL RECORD NO.:  [id number]  LOCATION:                                 FACILITY:  PHYSICIAN:  Doran Heater. Veverly Fells, M.D. DATE OF BIRTH:  December 14, 1935  DATE OF PROCEDURE:  11/15/2017 DATE OF DISCHARGE:  11/16/2017                              OPERATIVE REPORT   PREOPERATIVE DIAGNOSIS:  Right shoulder rotator cuff tear arthropathy.  POSTOPERATIVE DIAGNOSIS:  Right shoulder rotator cuff tear arthropathy.  PROCEDURE PERFORMED:  Right reverse total shoulder arthroplasty using Biomet comprehensive reverse system.  SURGEON:  Doran Heater. Veverly Fells, MD.  ASSISTANT:  Abbott Pao. Dixon, PA-C who has scrubbed during the entire procedure and necessary for satisfactory completion of surgery.  ANESTHESIA:  General anesthesia was used plus interscalene block.  ESTIMATED BLOOD LOSS:  150 mL.  FLUID REPLACEMENT:  1500 mL crystalloid.  INSTRUMENT COUNTS:  Correct.  COMPLICATIONS:  None.  Perioperative antibiotics were given.  INDICATIONS:  The patient is an 82 year old male, presents with a history of worsening right shoulder pain and dysfunction secondary to rotator cuff tear arthropathy.  Despite conservative management, the patient has had worsening pain and declining function interfering with all ADLs and interfering with sleep.  Given the failure of conservative management, the patient now presents for operative treatment to restore function and eliminate pain with a reverse shoulder replacement. Informed consent obtained.  DESCRIPTION OF PROCEDURE:  After an adequate level of anesthesia achieved, the patient was positioned in a modified beach-chair position. Right shoulder was correctly identified and sterilely prepped and draped in the usual manner.  Time-out was called.  We entered the shoulder using standard deltopectoral incision, starting at the coracoid process extending down to the anterior humerus,  dissection down  through subcutaneous tissues using needle-tip Bovie.  The cephalic vein was identified, taken laterally with the deltoid, pectoralis taken medially. Conjoint tendon identified and retracted medially.  Deep retractors placed.  We tenodesed the biceps in situ with 0 Vicryl figure-of-eight suture incorporating the pectoralis tendon.  We next released the subscapularis subperiosteally off the lesser tuberosity and placed #2 FiberWire suture in a modified Mason-Allen suture technique for repair of the tendon.  At the end of surgery, we were able to free the subscap up and felt it was definitely repairable.  We then released the inferior capsule progressively externally rotating.  We then released the biceps and the remaining supraspinatus tendon.  There was very little supraspinatus remaining.  Once that was released, the shoulder extended and was able to be externally rotated into the proximal humerus.  We used a step-cut drill to enter the proximal humerus and then sequential reamers up to a size 14 mm.  We then left the 14 reamer in and resected the head 30 degrees retroversion using the head resection guide.  Next, we removed excess humeral osteophytes with a rongeur, subluxed the humerus posteriorly, placed our retractors to gain visualization of the glenoid.  We did a 360 capsule labral removal, removed the biceps as well, and we had good visualization of the native glenoid, which had very little cartilage on it.  We used a Cobb elevator to remove the remaining cartilage.  We identified our center point for our guide pin and drilled that with a guide with 10 degrees inferior tilt.  Once we had that drilled out, we impacted the baseplate in position.  We irrigated thoroughly.  The baseplate was fully seated.  We then measured and placed our central 6.5 screw, which was a 30 mm screw.  We then placed inferior 30 and superior 30 screws which were locked and then a 15 nonlocked posteriorly.   We had excellent purchase and security with the baseplate.  We selected the 41 glenosphere and set the eccentricity on B and dialed that inferiorly.  We had good coverage.  I was able to impact that glenosphere into position, it was secured.  The axillary nerve was protected and free and clear.  We irrigated thoroughly.  We then went back to the humeral side, extending the shoulder externally rotating, and did our humeral preparation with broaches starting at a 12 and going up to a 14.  We then trialed with the 14 broach and the standard offset 41 poly and a 44 base underneath that poly.  Once we reduced the shoulder, we had a nice little pop, excellent stability throughout a full arc of motion.  The conjoint tendon tensioned nicely. The axillary nerve not under too much tension.  No gapping with external rotation or with an inferior pole on the elbow.  We dislocated the trial components.  We irrigated thoroughly.  We then used available bone graft from the head to supplement on the humeral side and used the press-fit 14 stem which was a mini stem, humeral stem 14 mm x 83 mm long and porous plasma coated and impacted that in position with bone graft.  It was nice and stable.  We then selected the 44 humeral tray with locking ring with the 41 poly with no additional offset, it was standard, and impacted that into the appropriate position.  We then reduced the shoulder and had an excellent stability and range of motion.  We irrigated again and then went ahead and repaired the subscapularis.  We had placed drill holes in the lesser tuberosity and #2 FiberWire suture prior to placing the stem.  We then used those sutures in bone and additional sutures through bone to do an anatomic repair of the subscapularis.  We were able to externally rotate 30 degrees with no problem with that subscap repair in place.  No impingement or tethering at all.  We irrigated thoroughly and closed deltopectoral  interval with 0 Vicryl suture followed by 2-0 Vicryl for subcutaneous closure and 4-0 Monocryl for skin.  Steri-Strips applied followed by sterile dressing and a shoulder sling.  The patient transported to the recovery room in stable condition.     Doran Heater. Veverly Fells, M.D.     SRN/MEDQ  D:  11/15/2017  T:  11/15/2017  Job:  [id number]

## 2017-11-15 NOTE — Transfer of Care (Signed)
Immediate Anesthesia Transfer of Care Note  Patient: Louis [name].  Procedure(s) Performed: RIGHT REVERSE SHOULDER ARTHROPLASTY (Right Shoulder)  Patient Location: PACU  Anesthesia Type:MAC combined with regional for post-op pain  Level of Consciousness: awake, alert  and oriented  Airway & Oxygen Therapy: Patient Spontanous Breathing and Patient connected to nasal cannula oxygen  Post-op Assessment: Report given to RN, Post -op Vital signs reviewed and stable and Patient moving all extremities  Post vital signs: Reviewed and stable  Last Vitals:  Vitals:   11/15/17 0808  BP: (!) 149/58  Pulse: 67  Resp: 18  Temp: (!) 36.3 C  SpO2: 97%    Last Pain:  Vitals:   11/15/17 0808  TempSrc: Oral         Complications: No apparent anesthesia complications

## 2017-11-15 NOTE — Anesthesia Postprocedure Evaluation (Signed)
Anesthesia Post Note  Patient: Louis [name].  Procedure(s) Performed: RIGHT REVERSE SHOULDER ARTHROPLASTY (Right Shoulder)     Patient location during evaluation: PACU Anesthesia Type: General Level of consciousness: awake and alert Pain management: pain level controlled Vital Signs Assessment: post-procedure vital signs reviewed and stable Respiratory status: spontaneous breathing, nonlabored ventilation, respiratory function stable and patient connected to nasal cannula oxygen Cardiovascular status: blood pressure returned to baseline and stable Postop Assessment: no apparent nausea or vomiting Anesthetic complications: no    Last Vitals:  Vitals:   11/15/17 1320 11/15/17 1323  BP: (!) 105/42 (!) 115/58  Pulse: 71 82  Resp: 12 14  Temp: 36.9 C   SpO2: 99% 100%    Last Pain:  Vitals:   11/15/17 1153  TempSrc:   PainSc: 0-No pain                 Montez Hageman

## 2017-11-15 NOTE — Brief Op Note (Signed)
11/15/2017  12:01 PM  PATIENT:  Louis [name].  82 y.o. male  PRE-OPERATIVE DIAGNOSIS:  right shoulder rotator cuff tear arthropathy  POST-OPERATIVE DIAGNOSIS:  right shoulder rotator cuff tear arthropathy  PROCEDURE:  Procedure(s): RIGHT REVERSE SHOULDER ARTHROPLASTY (Right) Biomet Comprehensive Reverse  SURGEON:  Surgeon(s) and Role:    Netta Cedars, MD - Primary  PHYSICIAN ASSISTANT:   ASSISTANTS: Ventura Bruns, PA-C   ANESTHESIA:   regional and general  EBL:  200 mL   BLOOD ADMINISTERED:none  DRAINS: none   LOCAL MEDICATIONS USED:  MARCAINE     SPECIMEN:  No Specimen  DISPOSITION OF SPECIMEN:  N/A  COUNTS:  YES  TOURNIQUET:  * No tourniquets in log *  DICTATION: .Other Dictation: Dictation Number [id number]  PLAN OF CARE: Admit to inpatient   PATIENT DISPOSITION:  PACU - hemodynamically stable.   Delay start of Pharmacological VTE agent (>24hrs) due to surgical blood loss or risk of bleeding: no

## 2017-11-15 NOTE — Anesthesia Preprocedure Evaluation (Addendum)
Anesthesia Evaluation  Patient identified by MRN, date of birth, ID band Patient awake    Reviewed: Allergy & Precautions, NPO status , Patient's Chart, lab work & pertinent test results  Airway Mallampati: II  TM Distance: >3 FB Neck ROM: Full    Dental no notable dental hx. (+) Edentulous Upper, Edentulous Lower, Dental Advisory Given   Pulmonary former smoker, PE (2015)   Pulmonary exam normal breath sounds clear to auscultation       Cardiovascular hypertension, Pt. on medications Normal cardiovascular exam Rhythm:Regular Rate:Normal     Neuro/Psych Spinal stenosis negative neurological ROS  negative psych ROS   GI/Hepatic negative GI ROS, Neg liver ROS,   Endo/Other  negative endocrine ROSdiabetes, Type 2  Renal/GU negative Renal ROS  negative genitourinary   Musculoskeletal negative musculoskeletal ROS (+)   Abdominal   Peds negative pediatric ROS (+)  Hematology hemochromatosis   Anesthesia Other Findings   Reproductive/Obstetrics negative OB ROS                           Anesthesia Physical Anesthesia Plan  ASA: II  Anesthesia Plan: General   Post-op Pain Management:    Induction: Intravenous  PONV Risk Score and Plan: 2 and Ondansetron and Treatment may vary due to age or medical condition  Airway Management Planned: Oral ETT  Additional Equipment:   Intra-op Plan:   Post-operative Plan: Extubation in OR  Informed Consent: I have reviewed the patients History and Physical, chart, labs and discussed the procedure including the risks, benefits and alternatives for the proposed anesthesia with the patient or authorized representative who has indicated his/her understanding and acceptance.   Dental advisory given  Plan Discussed with: CRNA  Anesthesia Plan Comments:         Anesthesia Quick Evaluation

## 2017-11-15 NOTE — Op Note (Deleted)
  The note originally documented on this encounter has been moved the the encounter in which it belongs.  

## 2017-11-15 NOTE — Anesthesia Procedure Notes (Signed)
Anesthesia Regional Block: Supraclavicular block   Pre-Anesthetic Checklist: ,, timeout performed, Correct Patient, Correct Site, Correct Laterality, Correct Procedure, Correct Position, site marked, Risks and benefits discussed,  Surgical consent,  Pre-op evaluation,  At surgeon's request and post-op pain management  Laterality: Right and Upper  Prep: Maximum Sterile Barrier Precautions used, chloraprep       Needles:  Injection technique: Single-shot  Needle Type: Echogenic Stimulator Needle     Needle Length: 10cm      Additional Needles:   Procedures:,,,, ultrasound used (permanent image in chart),,,,  Narrative:  Start time: 11/15/2017 9:04 AM End time: 11/15/2017 9:14 AM Injection made incrementally with aspirations every 5 mL.  Performed by: Personally  Anesthesiologist: Montez Hageman, MD  Additional Notes: Risks, benefits and alternative to block explained extensively.  Patient tolerated procedure well, without complications.

## 2017-11-16 LAB — BASIC METABOLIC PANEL
ANION GAP: 13 (ref 5–15)
BUN: 17 mg/dL (ref 6–20)
CHLORIDE: 104 mmol/L (ref 101–111)
CO2: 22 mmol/L (ref 22–32)
Calcium: 8.4 mg/dL — ABNORMAL LOW (ref 8.9–10.3)
Creatinine, Ser: 1.62 mg/dL — ABNORMAL HIGH (ref 0.61–1.24)
GFR calc Af Amer: 45 mL/min — ABNORMAL LOW (ref >60)
GFR calc non Af Amer: 38 mL/min — ABNORMAL LOW (ref >60)
Glucose, Bld: 156 mg/dL — ABNORMAL HIGH (ref 65–99)
POTASSIUM: 3.9 mmol/L (ref 3.5–5.1)
Sodium: 139 mmol/L (ref 135–145)

## 2017-11-16 LAB — GLUCOSE, CAPILLARY: GLUCOSE-CAPILLARY: 154 mg/dL — AB (ref 65–99)

## 2017-11-16 LAB — HEMOGLOBIN AND HEMATOCRIT, BLOOD
HCT: 30.4 % — ABNORMAL LOW (ref 39.0–52.0)
Hemoglobin: 9.5 g/dL — ABNORMAL LOW (ref 13.0–17.0)

## 2017-11-16 NOTE — Progress Notes (Signed)
Subjective: 1 Day Post-Op Procedure(s) (LRB): RIGHT REVERSE SHOULDER ARTHROPLASTY (Right) Patient reports pain as mild to right shoulder.  Tolerating PO's well. Denies CP,SOB,OR F/c.  Objective: Vital signs in last 24 hours: Temp:  [97.9 F (36.6 C)-99.2 F (37.3 C)] 99.2 F (37.3 C) (02/16 0717) Pulse Rate:  [65-91] 83 (02/16 0717) Resp:  [7-18] 16 (02/16 0717) BP: (105-139)/(42-89) 108/42 (02/16 0717) SpO2:  [92 %-100 %] 96 % (02/16 0717)  Intake/Output from previous day: 02/15 0701 - 02/16 0700 In: 940 [P.O.:240; I.V.:700] Out: 200 [Blood:200] Intake/Output this shift: Total I/O In: 360 [P.O.:360] Out: -   Recent Labs    11/16/17 0549  HGB 9.5*   Recent Labs    11/16/17 0549  HCT 30.4*   Recent Labs    11/16/17 0549  NA 139  K 3.9  CL 104  CO2 22  BUN 17  CREATININE 1.62*  GLUCOSE 156*  CALCIUM 8.4*   No results for input(s): LABPT, INR in the last 72 hours.  Well nourished. Alert and oriented x3. RRR, Lungs clear, BS x4. Abdomen soft and non tender. Right Calf soft and non tender. Right shoulder dressing C/D/I. No DVT signs. Compartment soft. No signs of infection.  Right UE grossly neurovascular intact.  Assessment/Plan: 1 Day Post-Op Procedure(s) (LRB): RIGHT REVERSE SHOULDER ARTHROPLASTY (Right) D/c home F/u in office with Dr.Norris Follow instructions Aquacel dressing applied  Louis [name] L 11/16/2017, 9:15 AM

## 2017-11-16 NOTE — Progress Notes (Signed)
Patient alert and oriented, mae's well, voiding adequate amount of urine, swallowing without difficulty,  c/o mild pain at time of discharge and medication already given. Patient discharged home with family. Script and discharged instructions given to patient. Patient and family stated understanding of instructions given. Patient has an appointment with Dr. Veverly Fells

## 2017-11-16 NOTE — Evaluation (Signed)
Occupational Therapy Evaluation Patient Details Name: Louis [name]. MRN: [id number] DOB: 07-16-1936 Today's Date: 11/16/2017    History of Present Illness 82 yo male s/p right reverse total shoulder. PMH including anemia, arthritis, HTN, pulmonary embolism, Bil TKA, light total shoulder, and lumbar laminectomy.   Clinical Impression   PTA, pt was living with his wife and was independent. Pt currently requiring Max A for UB ADLs, Mod A for LB ADLs, and Min Guard A for functional mobility. Providing education and handout on shoulder precautions, sling management, UB ADLs, LB ADLs, exercises, and sleep positioning. Pt performing elbow, wrist, and hand exercises for 10 reps each at EOB. Pt seen for second visit to review shoulder education and educate wife on shoulder information and UB ADLs. Pt and wife verbalizing and demonstrating understanding. All education provided and pt and family questions answered. Recommend dc home with 24 hour supervision/assistance. All acute OT needs met and will sign off.     Follow Up Recommendations  Follow surgeon's recommendation for DC plan and follow-up therapies;Supervision/Assistance - 24 hour    Equipment Recommendations  None recommended by OT    Recommendations for Other Services       Precautions / Restrictions Precautions Precautions: Shoulder Type of Shoulder Precautions: Conservative protocal. No shoulder ROM. Elbow, wrist, and hand ROM WFL Shoulder Interventions: Shoulder sling/immobilizer;At all times;Off for dressing/bathing/exercises Precaution Booklet Issued: Yes (comment) Precaution Comments: Reviewed all shoulder precautions and adherance during ADLs Required Braces or Orthoses: Sling Restrictions Weight Bearing Restrictions: Yes RUE Weight Bearing: Non weight bearing      Mobility Bed Mobility               General bed mobility comments: Sitting at EOB upon arrival. Educated pt and wife on safe bed  mobility  Transfers Overall transfer level: Needs assistance Equipment used: None Transfers: Sit to/from Stand Sit to Stand: Min guard         General transfer comment: MIn Guard A for safety    Balance Overall balance assessment: Needs assistance Sitting-balance support: No upper extremity supported;Feet supported Sitting balance-Leahy Scale: Good     Standing balance support: No upper extremity supported;During functional activity Standing balance-Leahy Scale: Fair                             ADL either performed or assessed with clinical judgement   ADL Overall ADL's : Needs assistance/impaired Eating/Feeding: Minimal assistance;Sitting   Grooming: Minimal assistance;Standing Grooming Details (indicate cue type and reason): Min A for bilateral tasks Upper Body Bathing: Maximal assistance;Sitting;With caregiver independent assisting   Lower Body Bathing: Minimal assistance;Sit to/from stand   Upper Body Dressing : Maximal assistance;With caregiver independent assisting;Sitting;Cueing for compensatory techniques;Cueing for sequencing;Adhering to UE precautions Upper Body Dressing Details (indicate cue type and reason): educated pt and wife on compensatory techniques for donning/doffing shirts. Pt wife assisting to don shirt and demosntrating understanding of precautions Lower Body Dressing: Moderate assistance;Sit to/from stand Lower Body Dressing Details (indicate cue type and reason): Mod A for manading pants and bringing over right hip Toilet Transfer: Min guard;Cueing for safety;Ambulation           Functional mobility during ADLs: Min guard General ADL Comments: Pt demonstrating decreased funcitonal performance. Providing education to pt and wife for shoulder precautions, excercises, UB ADLs, LB ADLs, and sling management.     Vision Patient Visual Report: No change from baseline       Perception  Praxis      Pertinent Vitals/Pain Pain  Assessment: Faces Faces Pain Scale: Hurts even more Pain Location: Right shoulder Pain Descriptors / Indicators: Constant;Discomfort;Grimacing Pain Intervention(s): Monitored during session;Limited activity within patient's tolerance;Repositioned     Hand Dominance Right   Extremity/Trunk Assessment Upper Extremity Assessment Upper Extremity Assessment: RUE deficits/detail RUE Deficits / Details: s/p right shoulder replacement RUE: Unable to fully assess due to immobilization RUE Coordination: decreased fine motor;decreased gross motor   Lower Extremity Assessment Lower Extremity Assessment: Overall WFL for tasks assessed   Cervical / Trunk Assessment Cervical / Trunk Assessment: Normal   Communication Communication Communication: No difficulties   Cognition Arousal/Alertness: Awake/alert Behavior During Therapy: WFL for tasks assessed/performed Overall Cognitive Status: Within Functional Limits for tasks assessed                                     General Comments  Providing handout with shoulder protocal. Wife present for second visit    Exercises Exercises: Shoulder Shoulder Exercises Elbow Flexion: AAROM;Right;10 reps;Seated Elbow Extension: AAROM;Right;10 reps;Seated Wrist Flexion: AROM;Right;10 reps;Seated Wrist Extension: Right;AROM;10 reps;Seated Digit Composite Flexion: AROM;Right;10 reps;Seated Composite Extension: AROM;Right;10 reps;Seated Neck Flexion: AROM;5 reps;Seated Neck Extension: AROM;5 reps;Seated Neck Lateral Flexion - Right: AROM;5 reps;Seated Neck Lateral Flexion - Left: AROM;5 reps;Seated   Shoulder Instructions Shoulder Instructions Donning/doffing shirt without moving shoulder: Caregiver independent with task Method for sponge bathing under operated UE: Caregiver independent with task Donning/doffing sling/immobilizer: Minimal assistance;Caregiver independent with task Correct positioning of sling/immobilizer: Caregiver  independent with task;Minimal assistance ROM for elbow, wrist and digits of operated UE: Supervision/safety Sling wearing schedule (on at all times/off for ADL's): Supervision/safety Proper positioning of operated UE when showering: Independent Positioning of UE while sleeping: Maximal assistance;Caregiver independent with task    Home Living Family/patient expects to be discharged to:: Private residence Living Arrangements: Spouse/significant other Available Help at Discharge: Family Type of Home: House Home Access: Stairs to enter Technical brewer of Steps: 2 Entrance Stairs-Rails: Right Home Layout: Able to live on main level with bedroom/bathroom;Two level     Bathroom Shower/Tub: Teacher, early years/pre: Handicapped height     Home Equipment: Environmental consultant - 2 wheels;Cane - single point;Adaptive equipment Adaptive Equipment: Reacher        Prior Functioning/Environment Level of Independence: Independent                 OT Problem List: Decreased strength;Decreased range of motion;Decreased activity tolerance;Impaired balance (sitting and/or standing);Decreased knowledge of use of DME or AE;Decreased knowledge of precautions;Impaired UE functional use;Pain      OT Treatment/Interventions:      OT Goals(Current goals can be found in the care plan section) Acute Rehab OT Goals Patient Stated Goal: Go home OT Goal Formulation: All assessment and education complete, DC therapy  OT Frequency:     Barriers to D/C:            Co-evaluation              AM-PAC PT "6 Clicks" Daily Activity     Outcome Measure Help from another person eating meals?: A Little Help from another person taking care of personal grooming?: A Little Help from another person toileting, which includes using toliet, bedpan, or urinal?: A Little Help from another person bathing (including washing, rinsing, drying)?: A Lot Help from another person to put on and taking off  regular upper body clothing?:  A Lot Help from another person to put on and taking off regular lower body clothing?: A Lot 6 Click Score: 15   End of Session Equipment Utilized During Treatment: Other (comment)(Sling) Nurse Communication: Mobility status;Precautions;Weight bearing status  Activity Tolerance: Patient tolerated treatment well Patient left: in chair;with call bell/phone within reach;with family/visitor present  OT Visit Diagnosis: Unsteadiness on feet (R26.81);Muscle weakness (generalized) (M62.81);Pain Pain - Right/Left: Right Pain - part of body: Shoulder                Time: 8185-631 and 923-951 OT Time Calculation (min): 47 min Charges:  OT General Charges $OT Visit: 2 Visit OT Evaluation $OT Eval Moderate Complexity: 1 Mod OT Treatments $Self Care/Home Management : 8-22 mins $Therapeutic Activity: 8-22 mins G-Codes:     [place], OTR/L Acute Rehab Pager: [phone] Office: [place] 11/16/2017, 10:08 AM

## 2017-11-18 ENCOUNTER — Encounter (HOSPITAL_COMMUNITY): Payer: Self-pay | Admitting: Orthopedic Surgery

## 2017-11-18 NOTE — Discharge Summary (Addendum)
Orthopedic Discharge Summary        Physician Discharge Summary  Patient ID: Louis [name]. MRN: [id number] DOB/AGE: 1935-10-05 82 y.o.  Admit date: 11/15/2017 Discharge date: 11/16/2017  Procedures:  Procedure(s) (LRB): RIGHT REVERSE SHOULDER ARTHROPLASTY (Right)  Attending Physician:  Dr. Esmond Plants  Admission Diagnoses:   Right shoulder rotator cuff tear arthropathy  Discharge Diagnoses:  same   Past Medical History:  Diagnosis Date  . Anemia   . Arthritis    Dr Amil Amen, [facility]TX rx- Rheumatology  . CKD (chronic kidney disease)   . Diabetes mellitus    takes 1 pill  . Diverticulosis   . GERD (gastroesophageal reflux disease)   . Hemochromatosis     Dr Henrene Pastor  . History of kidney stones 1980   passed spont.  Marland Kitchen HOH (hard of hearing)    congenital hearing loss  . Hx of adenomatous colonic polyps   . Hyperlipidemia   . Hypertension   . Hypothyroidism   . IBS (irritable bowel syndrome)   . Internal hemorrhoids   . Kidney stones, calcium oxalate    1985 & 2005  . Pulmonary embolism ([place]) 2015   hosp. with Heparin & then on Xarelto- 3 months or so  . Reflux esophagitis   . SBO (small bowel obstruction) ([place]) 2004  . Spinal stenosis    Dr.Ramos  . Testosterone deficiency    111 in 2007    PCP: Carollee Herter, Alferd Apa, DO   Discharged Condition: good  Hospital Course:  Patient underwent the above stated procedure on 11/15/2017. Patient tolerated the procedure well and brought to the recovery room in good condition and subsequently to the floor. Patient had an uncomplicated hospital course and was stable for discharge.   Disposition: 01-Home or Self Care with follow up in 2 weeks   Follow-up Information    Netta Cedars, MD. Call in 2 week(s).   Specialty:  Orthopedic Surgery Why:  [phone] Contact information: [address] [place] [address] [phone]             Allergies as of 11/16/2017      Reactions   Shellfish Allergy Anaphylaxis, Hives, Swelling   ANGIOEDEMA   Garlic Nausea And Vomiting   Iron Other (See Comments)   HEMACHROMATOSIS      Medication List    TAKE these medications   ALPRAZolam 0.5 MG tablet Commonly known as:  XANAX TAKE 1/2 TABLET BY MOUTH EVERY 8 TO 12 HOURS AS NEEDED What changed:    how much to take  how to take this  when to take this  reasons to take this  additional instructions   atorvastatin 20 MG tablet Commonly known as:  LIPITOR TAKE 1 TABLET DAILY AT 6 P.M. What changed:  See the new instructions.   BLUE-EMU SUPER STRENGTH EX Apply 1 application topically daily as needed (pain).   CALCIUM-VITAMIN D PO Take 1 tablet by mouth daily.   ELIQUIS 5 MG Tabs tablet Generic drug:  apixaban TAKE 1 TABLET TWICE A DAY What changed:    how much to take  how to take this  when to take this   fenofibrate 160 MG tablet TAKE 1 TABLET(160 MG) BY MOUTH DAILY   fluticasone 50 MCG/ACT nasal spray Commonly known as:  FLONASE Place 2 sprays into both nostrils daily. What changed:    when to take this  reasons to take this   folic acid 1 MG tablet Commonly known as:  FOLVITE Take 1 mg by mouth daily.   HYDROcodone-acetaminophen 10-325 MG tablet Commonly known as:  NORCO Take 1 tablet by mouth every 4 (four) hours as needed for moderate pain or severe pain (pain).   hydrocortisone 2.5 % rectal cream Commonly known as:  ANUSOL-HC Place rectally 3 (three) times daily as needed for hemorrhoids or anal itching.   levothyroxine 125 MCG tablet Commonly known as:  SYNTHROID, LEVOTHROID TAKE 1 TABLET(125 MCG) BY MOUTH DAILY   methocarbamol 500 MG tablet Commonly known as:  ROBAXIN Take 1 tablet (500 mg total) by mouth every 6 (six) hours as needed for muscle spasms.   methotrexate 2.5 MG tablet Commonly known as:  RHEUMATREX Take 20 mg by mouth every Monday.   multivitamin per tablet Take 1 tablet by mouth daily.   ondansetron 4 MG  tablet Commonly known as:  ZOFRAN Take 1 tablet (4 mg total) by mouth every 8 (eight) hours as needed for nausea or vomiting.   pantoprazole 40 MG tablet Commonly known as:  PROTONIX Take 1 tablet (40 mg total) daily by mouth.   pioglitazone 15 MG tablet Commonly known as:  ACTOS TAKE 1 TABLET DAILY   polyethylene glycol packet Commonly known as:  MIRALAX Take 17 g by mouth daily as needed for moderate constipation. OTC   vitamin B-12 1000 MCG tablet Commonly known as:  CYANOCOBALAMIN Take 1,000 mcg by mouth daily.         Signed: Augustin Schooling 11/18/2017, 10:51 AM  [facility] is now [employer] [address], [place] [employer], [place], [place] [address] Phone: [place]

## 2017-11-21 ENCOUNTER — Ambulatory Visit: Payer: Medicare Other | Admitting: Family Medicine

## 2017-11-25 ENCOUNTER — Telehealth: Payer: Self-pay | Admitting: Family Medicine

## 2017-11-25 MED ORDER — LEVOTHYROXINE SODIUM 125 MCG PO TABS: ORAL_TABLET | ORAL | 0 refills | Status: DC

## 2017-11-25 NOTE — Telephone Encounter (Signed)
Copied from [place]. Topic: Quick Communication - Rx Refill/Question >> Nov 25, 2017 10:04 AM Yvette Rack wrote: Medication: levothyroxine (SYNTHROID, LEVOTHROID) 125 MCG tablet  patient would like a 90 day supply   Has the patient contacted their pharmacy? No.    Mail service   (Agent: If no, request that the patient contact the pharmacy for the refill.)   Preferred Pharmacy (with phone number or street name): [facility], [place] [phone] (Phone) [phone] (Fax)     Agent: Please be advised that RX refills may take up to 3 business days. We ask that you follow-up with your pharmacy.

## 2017-11-28 DIAGNOSIS — Z96611 Presence of right artificial shoulder joint: Secondary | ICD-10-CM | POA: Diagnosis not present

## 2017-11-28 DIAGNOSIS — Z471 Aftercare following joint replacement surgery: Secondary | ICD-10-CM | POA: Diagnosis not present

## 2017-12-08 ENCOUNTER — Other Ambulatory Visit: Payer: Self-pay | Admitting: Family Medicine

## 2017-12-11 ENCOUNTER — Other Ambulatory Visit: Payer: Self-pay | Admitting: Family Medicine

## 2017-12-11 DIAGNOSIS — E785 Hyperlipidemia, unspecified: Secondary | ICD-10-CM

## 2017-12-18 ENCOUNTER — Other Ambulatory Visit: Payer: Self-pay

## 2017-12-18 ENCOUNTER — Encounter: Payer: Self-pay | Admitting: Hematology & Oncology

## 2017-12-18 ENCOUNTER — Inpatient Hospital Stay: Payer: Medicare Other | Attending: Hematology & Oncology | Admitting: Hematology & Oncology

## 2017-12-18 ENCOUNTER — Inpatient Hospital Stay: Payer: Medicare Other

## 2017-12-18 VITALS — BP 134/64 | HR 85 | Temp 98.7°F | Resp 18 | Wt 224.0 lb

## 2017-12-18 DIAGNOSIS — I82441 Acute embolism and thrombosis of right tibial vein: Secondary | ICD-10-CM | POA: Insufficient documentation

## 2017-12-18 DIAGNOSIS — I2602 Saddle embolus of pulmonary artery with acute cor pulmonale: Secondary | ICD-10-CM

## 2017-12-18 DIAGNOSIS — I82431 Acute embolism and thrombosis of right popliteal vein: Secondary | ICD-10-CM | POA: Insufficient documentation

## 2017-12-18 DIAGNOSIS — I2782 Chronic pulmonary embolism: Secondary | ICD-10-CM

## 2017-12-18 DIAGNOSIS — Z7901 Long term (current) use of anticoagulants: Secondary | ICD-10-CM | POA: Diagnosis not present

## 2017-12-18 DIAGNOSIS — I2699 Other pulmonary embolism without acute cor pulmonale: Secondary | ICD-10-CM | POA: Diagnosis not present

## 2017-12-18 LAB — CBC WITH DIFFERENTIAL (CANCER CENTER ONLY)
BASOS ABS: 0 10*3/uL (ref 0.0–0.1)
BASOS PCT: 0 %
Eosinophils Absolute: 0.3 10*3/uL (ref 0.0–0.5)
Eosinophils Relative: 5 %
HCT: 33.9 % — ABNORMAL LOW (ref 38.7–49.9)
HEMOGLOBIN: 10.7 g/dL — AB (ref 13.0–17.1)
Lymphocytes Relative: 25 %
Lymphs Abs: 1.4 10*3/uL (ref 0.9–3.3)
MCH: 30.1 pg (ref 28.0–33.4)
MCHC: 31.6 g/dL — ABNORMAL LOW (ref 32.0–35.9)
MCV: 95.2 fL (ref 82.0–98.0)
Monocytes Absolute: 0.5 10*3/uL (ref 0.1–0.9)
Monocytes Relative: 8 %
NEUTROS ABS: 3.5 10*3/uL (ref 1.5–6.5)
NEUTROS PCT: 62 %
Platelet Count: 324 10*3/uL (ref 145–400)
RBC: 3.56 MIL/uL — AB (ref 4.20–5.70)
RDW: 16 % — AB (ref 11.1–15.7)
WBC: 5.7 10*3/uL (ref 4.0–10.0)

## 2017-12-18 LAB — IRON AND TIBC
IRON: 71 ug/dL (ref 42–163)
SATURATION RATIOS: 23 % — AB (ref 42–163)
TIBC: 315 ug/dL (ref 202–409)
UIBC: 244 ug/dL

## 2017-12-18 LAB — CMP (CANCER CENTER ONLY)
ALK PHOS: 79 U/L (ref 40–150)
ALT: 14 U/L (ref 0–55)
ANION GAP: 12 — AB (ref 3–11)
AST: 23 U/L (ref 5–34)
Albumin: 3.9 g/dL (ref 3.5–5.0)
BILIRUBIN TOTAL: 0.4 mg/dL (ref 0.2–1.2)
BUN: 21 mg/dL (ref 7–26)
CALCIUM: 9.5 mg/dL (ref 8.4–10.4)
CO2: 22 mmol/L (ref 22–29)
Chloride: 106 mmol/L (ref 98–109)
Creatinine: 1.81 mg/dL — ABNORMAL HIGH (ref 0.70–1.30)
GFR, EST AFRICAN AMERICAN: 39 mL/min — AB (ref >60)
GFR, Estimated: 34 mL/min — ABNORMAL LOW (ref >60)
Glucose, Bld: 124 mg/dL (ref 70–140)
Potassium: 4.2 mmol/L (ref 3.5–5.1)
Sodium: 140 mmol/L (ref 136–145)
TOTAL PROTEIN: 7 g/dL (ref 6.4–8.3)

## 2017-12-18 LAB — FERRITIN: Ferritin: 25 ng/mL (ref 22–316)

## 2017-12-18 LAB — D-DIMER, QUANTITATIVE: D-Dimer, Quant: 1.73 ug/mL-FEU — ABNORMAL HIGH (ref 0.00–0.50)

## 2017-12-18 NOTE — Progress Notes (Signed)
Hematology and Oncology Follow Up Visit  Louis [name] [id number] 03-22-1936 82 y.o. 12/18/2017   Principle Diagnosis:   Recurrent pulmonary embolism - ? 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  He did have his right shoulder surgery back in February.  He got through this without any difficulties.  He comes in with a sling on to immobilize the right arm.  He had no bleeding with the surgery.  He is on Eliquis.  He is doing well with the Eliquis.  He has had no issues with Eliquis.  He has had no cough or shortness of breath.  There is been no chest wall pain.  We saw him back in December, his ferritin was only 25 with an iron saturation of 17%.  I am quite surprised by this given that he is homozygous for 1 of the major genetic mutations for hemochromatosis.  He has had a good appetite.  He has had no change in bowel or bladder habits.  Overall, his performance status is ECOG 1.    Medications:  Current Outpatient Medications:  .  ALPRAZolam (XANAX) 0.5 MG tablet, TAKE 1/2 TABLET BY MOUTH EVERY 8 TO 12 HOURS AS NEEDED (Patient taking differently: Take 0.5 mg by mouth at bedtime as needed for anxiety. ), Disp: 30 tablet, Rfl: 0 .  atorvastatin (LIPITOR) 20 MG tablet, TAKE 1 TABLET DAILY AT 6 P.M., Disp: 90 tablet, Rfl: 1 .  CALCIUM-VITAMIN D PO, Take 1 tablet by mouth daily. , Disp: , Rfl:  .  ELIQUIS 5 MG TABS tablet, TAKE 1 TABLET TWICE A DAY (Patient taking differently: TAKE 5 mg TABLET TWICE A DAY), Disp: 180 tablet, Rfl: 0 .  fenofibrate 160 MG tablet, TAKE 1 TABLET(160 MG) BY MOUTH DAILY, Disp: 90 tablet, Rfl: 1 .  fluticasone (FLONASE) 50 MCG/ACT nasal spray, Place 2 sprays into both nostrils daily. (Patient taking differently: Place 2 sprays into both nostrils daily as needed for allergies or  rhinitis. ), Disp: 16 g, Rfl: 1 .  folic acid (FOLVITE) 1 MG tablet, Take 1 mg by mouth daily.  , Disp: , Rfl:  .  HYDROcodone-acetaminophen (NORCO) 10-325 MG tablet, Take 1 tablet by mouth every 4 (four) hours as needed for moderate pain or severe pain (pain)., Disp: 30 tablet, Rfl: 0 .  hydrocortisone (ANUSOL-HC) 2.5 % rectal cream, Place rectally 3 (three) times daily as needed for hemorrhoids or anal itching., Disp: 30 g, Rfl: 3 .  levothyroxine (SYNTHROID, LEVOTHROID) 125 MCG tablet, TAKE 1 TABLET(125 MCG) BY MOUTH DAILY, Disp: 90 tablet, Rfl: 0 .  Liniments (BLUE-EMU SUPER STRENGTH EX), Apply 1 application topically daily as needed (pain)., Disp: , Rfl:  .  methocarbamol (ROBAXIN) 500 MG tablet, Take 1 tablet (500 mg total) by mouth every 6 (six) hours as needed for muscle spasms., Disp: 40 tablet, Rfl: 3 .  methotrexate (RHEUMATREX) 2.5 MG tablet, Take 20 mg by mouth every Monday. , Disp: , Rfl:  .  multivitamin (THERAGRAN) per tablet, Take 1 tablet by mouth daily.  , Disp: , Rfl:  .  ondansetron (ZOFRAN) 4 MG tablet, Take 1 tablet (4 mg total) by mouth every 8 (eight) hours as needed for nausea or vomiting., Disp: 20 tablet, Rfl: 0 .  pantoprazole (PROTONIX) 40 MG tablet, Take 1 tablet (  40 mg total) daily by mouth., Disp: 90 tablet, Rfl: 1 .  pioglitazone (ACTOS) 15 MG tablet, TAKE 1 TABLET DAILY, Disp: 90 tablet, Rfl: 3 .  polyethylene glycol (MIRALAX) packet, Take 17 g by mouth daily as needed for moderate constipation. OTC, Disp: 30 each, Rfl: 3 .  vitamin B-12 (CYANOCOBALAMIN) 1000 MCG tablet, Take 1,000 mcg by mouth daily., Disp: , Rfl:  .  amoxicillin (AMOXIL) 500 MG capsule, , Disp: , Rfl:  .  glucose blood test strip, OneTouch Verio strips  U ONCE D UTD, Disp: , Rfl:   Allergies:  Allergies  Allergen Reactions  . Shellfish Allergy Anaphylaxis, Hives and Swelling    ANGIOEDEMA  . Garlic Nausea And Vomiting  . Iron Other (See Comments)    HEMACHROMATOSIS    Past Medical  History, Surgical history, Social history, and Family History were reviewed and updated.  Review of Systems: Review of Systems  Constitutional: Negative for appetite change, fatigue, fever and unexpected weight change.  HENT:   Negative for lump/mass, mouth sores, sore throat and trouble swallowing.   Respiratory: Negative for cough, hemoptysis and shortness of breath.   Cardiovascular: Negative for leg swelling and palpitations.  Gastrointestinal: Negative for abdominal distention, abdominal pain, blood in stool, constipation, diarrhea, nausea and vomiting.  Genitourinary: Negative for bladder incontinence, dysuria, frequency and hematuria.   Musculoskeletal: Negative for arthralgias, back pain, gait problem and myalgias.  Skin: Negative for itching and rash.  Neurological: Negative for dizziness, extremity weakness, gait problem, headaches, numbness, seizures and speech difficulty.  Hematological: Does not bruise/bleed easily.  Psychiatric/Behavioral: Negative for depression and sleep disturbance. The patient is not nervous/anxious.     Physical Exam:  weight is 224 lb (101.6 kg). His oral temperature is 98.7 F (37.1 C). His blood pressure is 134/64 and his pulse is 85. His respiration is 18 and oxygen saturation is 100%.   Wt Readings from Last 3 Encounters:  12/18/17 224 lb (101.6 kg)  11/08/17 220 lb 11.2 oz (100.1 kg)  10/31/17 221 lb 3.2 oz (100.3 kg)    Physical Exam  Constitutional: He is oriented to person, place, and time.  HENT:  Head: Normocephalic and atraumatic.  Mouth/Throat: Oropharynx is clear and moist.  Eyes: EOM are normal. Pupils are equal, round, and reactive to light.  Neck: Normal range of motion.  Cardiovascular: Normal rate, regular rhythm and normal heart sounds.  Pulmonary/Chest: Effort normal and breath sounds normal.  Abdominal: Soft. Bowel sounds are normal.  Musculoskeletal: Normal range of motion. He exhibits no edema, tenderness or deformity.    Lymphadenopathy:    He has no cervical adenopathy.  Neurological: He is alert and oriented to person, place, and time.  Skin: Skin is warm and dry. No rash noted. No erythema.  Psychiatric: He has a normal mood and affect. His behavior is normal. Judgment and thought content normal.  Vitals reviewed.    Lab Results  Component Value Date   WBC 5.7 12/18/2017   HGB 9.5 (L) 11/16/2017   HCT 33.9 (L) 12/18/2017   MCV 95.2 12/18/2017   PLT 324 12/18/2017     Chemistry      Component Value Date/Time   NA 139 11/16/2017 0549   NA 148 (H) 09/16/2017 1051   K 3.9 11/16/2017 0549   K 4.6 09/16/2017 1051   CL 104 11/16/2017 0549   CL 107 09/16/2017 1051   CO2 22 11/16/2017 0549   CO2 29 09/16/2017 1051   BUN 17 11/16/2017 0549  BUN 20 09/16/2017 1051   CREATININE 1.62 (H) 11/16/2017 0549   CREATININE 1.7 (H) 09/16/2017 1051      Component Value Date/Time   CALCIUM 8.4 (L) 11/16/2017 0549   CALCIUM 10.1 09/16/2017 1051   ALKPHOS 51 10/31/2017 1227   ALKPHOS 62 09/16/2017 1051   AST 19 10/31/2017 1227   AST 19 09/16/2017 1051   ALT 16 10/31/2017 1227   ALT 29 09/16/2017 1051   BILITOT 0.5 10/31/2017 1227   BILITOT 0.60 09/16/2017 1051         Impression and Plan: Louis [name] is an 82 year old white male.  He has had past thromboembolic events.  He has had past pulmonary emboli.  It is hard to know if this is a true lupus anticoagulant.  Again, seems like most of the patient's we see now have a positive test.  I will plan to get him back to see [place] in 3 months.  We will see him back, we will do his CT angiogram and Dopplers to assess for improvement in his thromboembolic disease.  I am just glad that he got through his shoulder surgery without any issues.  Hopefully, we will see him back, he will have much better use of his right arm.  Volanda Napoleon, MD 3/20/20191:18 PM

## 2017-12-21 LAB — DRVVT MIX: dRVVT Mix: 50.3 s — ABNORMAL HIGH (ref 0.0–47.0)

## 2017-12-21 LAB — LUPUS ANTICOAGULANT PANEL
DRVVT: 65.9 s — ABNORMAL HIGH (ref 0.0–47.0)
PTT Lupus Anticoagulant: 34.9 s (ref 0.0–51.9)

## 2017-12-21 LAB — DRVVT CONFIRM: dRVVT Confirm: 1 ratio (ref 0.8–1.2)

## 2017-12-23 ENCOUNTER — Other Ambulatory Visit: Payer: Self-pay | Admitting: Internal Medicine

## 2017-12-26 DIAGNOSIS — Z96611 Presence of right artificial shoulder joint: Secondary | ICD-10-CM | POA: Diagnosis not present

## 2017-12-26 DIAGNOSIS — Z471 Aftercare following joint replacement surgery: Secondary | ICD-10-CM | POA: Diagnosis not present

## 2017-12-30 DIAGNOSIS — N183 Chronic kidney disease, stage 3 (moderate): Secondary | ICD-10-CM | POA: Diagnosis not present

## 2017-12-30 DIAGNOSIS — R809 Proteinuria, unspecified: Secondary | ICD-10-CM | POA: Diagnosis not present

## 2017-12-30 DIAGNOSIS — E1122 Type 2 diabetes mellitus with diabetic chronic kidney disease: Secondary | ICD-10-CM | POA: Diagnosis not present

## 2017-12-31 DIAGNOSIS — H353132 Nonexudative age-related macular degeneration, bilateral, intermediate dry stage: Secondary | ICD-10-CM | POA: Diagnosis not present

## 2018-01-02 ENCOUNTER — Ambulatory Visit: Payer: Medicare Other | Admitting: Family Medicine

## 2018-01-03 ENCOUNTER — Ambulatory Visit (INDEPENDENT_AMBULATORY_CARE_PROVIDER_SITE_OTHER): Payer: Medicare Other | Admitting: Family Medicine

## 2018-01-03 ENCOUNTER — Encounter: Payer: Self-pay | Admitting: Family Medicine

## 2018-01-03 VITALS — BP 123/60 | HR 87 | Temp 98.7°F | Resp 16 | Ht 73.0 in | Wt 222.8 lb

## 2018-01-03 DIAGNOSIS — I10 Essential (primary) hypertension: Secondary | ICD-10-CM | POA: Diagnosis not present

## 2018-01-03 DIAGNOSIS — E1165 Type 2 diabetes mellitus with hyperglycemia: Secondary | ICD-10-CM

## 2018-01-03 DIAGNOSIS — E1151 Type 2 diabetes mellitus with diabetic peripheral angiopathy without gangrene: Secondary | ICD-10-CM

## 2018-01-03 DIAGNOSIS — E039 Hypothyroidism, unspecified: Secondary | ICD-10-CM

## 2018-01-03 DIAGNOSIS — E785 Hyperlipidemia, unspecified: Secondary | ICD-10-CM | POA: Diagnosis not present

## 2018-01-03 NOTE — Progress Notes (Signed)
Patient ID: Louis [name]., male    DOB: Aug 28, 1936  Age: 82 y.o. MRN: [id number]    Subjective:  Subjective  HPI Louis [name]. presents for f/u dm , cholesterol and bp,  No complaints.    HPI HYPERTENSION   Blood pressure range-not checking   Chest pain- no      Dyspnea- no Lightheadedness- no   Edema- no  Other side effects - no   Medication compliance: good Low salt diet- yes    DIABETES    Blood Sugar ranges-good per pt  Polyuria- no New Visual problems- no  Hypoglycemic symptoms- no  Other side effects-no Medication compliance - good Last eye exam-  Foot exam- today   HYPERLIPIDEMIA  Medication compliance- good RUQ pain- no  Muscle aches- no Other side effects-no   Review of Systems  Constitutional: Negative for appetite change, diaphoresis, fatigue and unexpected weight change.  Eyes: Negative for pain, redness and visual disturbance.  Respiratory: Negative for cough, chest tightness, shortness of breath and wheezing.   Cardiovascular: Negative for chest pain, palpitations and leg swelling.  Endocrine: Negative for cold intolerance, heat intolerance, polydipsia, polyphagia and polyuria.  Genitourinary: Negative for difficulty urinating, dysuria and frequency.  Neurological: Negative for dizziness, light-headedness, numbness and headaches.    History Past Medical History:  Diagnosis Date  . Anemia   . Arthritis    Dr Amil Amen, MTX rx- Rheumatology  . CKD (chronic kidney disease)   . Diabetes mellitus    takes 1 pill  . Diverticulosis   . GERD (gastroesophageal reflux disease)   . Hemochromatosis     Dr Henrene Pastor  . History of kidney stones 1980   passed spont.  Marland Kitchen HOH (hard of hearing)    congenital hearing loss  . Hx of adenomatous colonic polyps   . Hyperlipidemia   . Hypertension   . Hypothyroidism   . IBS (irritable bowel syndrome)   . Internal hemorrhoids   . Kidney stones, calcium oxalate    1985 & 2005  . Pulmonary embolism ([place])  2015   hosp. with Heparin & then on Xarelto- 3 months or so  . Reflux esophagitis   . SBO (small bowel obstruction) ([place]) 2004  . Spinal stenosis    Dr.Ramos  . Testosterone deficiency    111 in 2007    He has a past surgical history that includes Appendectomy; Colonoscopy w/ polypectomy (2005); lumbar nerve block; Total shoulder replacement; Total knee arthroplasty ( 201 & 2012); Refractive surgery (2005); Tonsillectomy and adenoidectomy; cataract (2005); TM replacement (1981); Middle ear surgery (1980); L4-5 & L5- S1 bilat facet injections (01/20/14); Joint replacement; Lumbar laminectomy with coflex 2 level (N/A, 07/30/2016); Rotator cuff repair; and Reverse shoulder arthroplasty (Right, 11/15/2017).   His family history includes Breast cancer in his maternal grandmother; Depression in his brother; Diabetes in his father and maternal aunt; Heart disease in his paternal aunt and paternal uncle; Heart failure in his mother; Stroke (age of onset: 74) in his father.He reports that he quit smoking about 37 years ago. He has never used smokeless tobacco. He reports that he drinks alcohol. He reports that he does not use drugs.  Current Outpatient Medications on File Prior to Visit  Medication Sig Dispense Refill  . ALPRAZolam (XANAX) 0.5 MG tablet TAKE 1/2 TABLET BY MOUTH EVERY 8 TO 12 HOURS AS NEEDED (Patient taking differently: Take 0.5 mg by mouth at bedtime as needed for anxiety. ) 30 tablet 0  . amoxicillin (AMOXIL) 500  MG capsule     . atorvastatin (LIPITOR) 20 MG tablet TAKE 1 TABLET DAILY AT 6 P.M. 90 tablet 1  . CALCIUM-VITAMIN D PO Take 1 tablet by mouth daily.     Marland Kitchen ELIQUIS 5 MG TABS tablet TAKE 1 TABLET TWICE A DAY (Patient taking differently: TAKE 5 mg TABLET TWICE A DAY) 180 tablet 0  . fenofibrate 160 MG tablet TAKE 1 TABLET(160 MG) BY MOUTH DAILY 90 tablet 1  . fluticasone (FLONASE) 50 MCG/ACT nasal spray Place 2 sprays into both nostrils daily. (Patient taking differently: Place 2  sprays into both nostrils daily as needed for allergies or rhinitis. ) 16 g 1  . folic acid (FOLVITE) 1 MG tablet Take 1 mg by mouth daily.      Marland Kitchen glucose blood test strip OneTouch Verio strips  U ONCE D UTD    . HYDROcodone-acetaminophen (NORCO) 10-325 MG tablet Take 1 tablet by mouth every 4 (four) hours as needed for moderate pain or severe pain (pain). 30 tablet 0  . hydrocortisone (ANUSOL-HC) 2.5 % rectal cream Place rectally 3 (three) times daily as needed for hemorrhoids or anal itching. 30 g 3  . levothyroxine (SYNTHROID, LEVOTHROID) 125 MCG tablet TAKE 1 TABLET(125 MCG) BY MOUTH DAILY 90 tablet 0  . Liniments (BLUE-EMU SUPER STRENGTH EX) Apply 1 application topically daily as needed (pain).    . methocarbamol (ROBAXIN) 500 MG tablet Take 1 tablet (500 mg total) by mouth every 6 (six) hours as needed for muscle spasms. 40 tablet 3  . methotrexate (RHEUMATREX) 2.5 MG tablet Take 20 mg by mouth every Monday.     . multivitamin (THERAGRAN) per tablet Take 1 tablet by mouth daily.      . ondansetron (ZOFRAN) 4 MG tablet Take 1 tablet (4 mg total) by mouth every 8 (eight) hours as needed for nausea or vomiting. 20 tablet 0  . ondansetron (ZOFRAN) 4 MG tablet TAKE 1 TABLET EVERY 6 HOURS AS NEEDED FOR NAUSEA OR VOMITING 40 tablet 0  . pantoprazole (PROTONIX) 40 MG tablet Take 1 tablet (40 mg total) daily by mouth. 90 tablet 1  . pioglitazone (ACTOS) 15 MG tablet TAKE 1 TABLET DAILY 90 tablet 3  . polyethylene glycol (MIRALAX) packet Take 17 g by mouth daily as needed for moderate constipation. OTC 30 each 3  . vitamin B-12 (CYANOCOBALAMIN) 1000 MCG tablet Take 1,000 mcg by mouth daily.     No current facility-administered medications on file prior to visit.      Objective:  Objective  Physical Exam  Constitutional: He is oriented to person, place, and time. Vital signs are normal. He appears well-developed and well-nourished. He is sleeping.  HENT:  Head: Normocephalic and atraumatic.    Mouth/Throat: Oropharynx is clear and moist.  Eyes: Pupils are equal, round, and reactive to light. EOM are normal.  Neck: Normal range of motion. Neck supple. No thyromegaly present.  Cardiovascular: Normal rate and regular rhythm.  No murmur heard. Pulmonary/Chest: Effort normal and breath sounds normal. No respiratory distress. He has no wheezes. He has no rales. He exhibits no tenderness.  Musculoskeletal: He exhibits no edema or tenderness.  Neurological: He is alert and oriented to person, place, and time.  Skin: Skin is warm and dry.  Psychiatric: He has a normal mood and affect. His behavior is normal. Judgment and thought content normal.  Nursing note and vitals reviewed.  BP 123/60   Pulse 87   Temp 98.7 F (37.1 C) (Oral)  Resp 16   Ht 6\' 1"  (1.854 m)   Wt 222 lb 12.8 oz (101.1 kg)   SpO2 100%   BMI 29.39 kg/m  Wt Readings from Last 3 Encounters:  01/03/18 222 lb 12.8 oz (101.1 kg)  12/18/17 224 lb (101.6 kg)  11/08/17 220 lb 11.2 oz (100.1 kg)     Lab Results  Component Value Date   WBC 5.7 12/18/2017   HGB 9.5 (L) 11/16/2017   HCT 33.9 (L) 12/18/2017   PLT 324 12/18/2017   GLUCOSE 124 12/18/2017   CHOL 175 05/21/2017   TRIG 193.0 (H) 05/21/2017   HDL 42.50 05/21/2017   LDLDIRECT 120.0 12/02/2015   LDLCALC 93 05/21/2017   ALT 14 12/18/2017   AST 23 12/18/2017   NA 140 12/18/2017   K 4.2 12/18/2017   CL 106 12/18/2017   CREATININE 1.81 (H) 12/18/2017   BUN 21 12/18/2017   CO2 22 12/18/2017   TSH 0.37 05/21/2017   PSA 1.67 10/11/2016   INR 1.02 06/18/2017   HGBA1C 6.6 (H) 11/06/2017   MICROALBUR 3.0 (H) 12/08/2014    No results found.   Assessment & Plan:  Plan  I am having Louis [name]. maintain his CALCIUM-VITAMIN D PO, folic acid, methotrexate, multivitamin, vitamin B-12, fluticasone, pioglitazone, pantoprazole, ALPRAZolam, ELIQUIS, hydrocortisone, ondansetron, polyethylene glycol, Liniments (BLUE-EMU SUPER STRENGTH EX),  HYDROcodone-acetaminophen, methocarbamol, levothyroxine, fenofibrate, atorvastatin, glucose blood, amoxicillin, and ondansetron.  No orders of the defined types were placed in this encounter.   Problem List Items Addressed This Visit      Unprioritized   DM (diabetes mellitus) type II, controlled, with peripheral vascular disorder ([place])    hgba1c to be checked, minimize simple carbs. Increase exercise as tolerated. Continue current meds      Essential hypertension    Well controlled, no changes to meds. Encouraged heart healthy diet such as the DASH diet and exercise as tolerated.       Hyperlipidemia LDL goal <70    Tolerating statin, encouraged heart healthy diet, avoid trans fats, minimize simple carbs and saturated fats. Increase exercise as tolerated      Hypothyroidism    Check labs con't meds       Relevant Orders   TSH    Other Visit Diagnoses    Hyperlipidemia LDL goal <100    -  Primary   Relevant Orders   Lipid panel   Comprehensive metabolic panel   Uncontrolled type 2 diabetes mellitus with hyperglycemia ([place])       Relevant Orders   Hemoglobin A1c      Follow-up: Return in about 6 months (around 07/05/2018) for fasting, hypertension, hyperlipidemia, diabetes II.  Ann Held, DO

## 2018-01-03 NOTE — Patient Instructions (Signed)

## 2018-01-05 NOTE — Assessment & Plan Note (Signed)
Check labs con't meds 

## 2018-01-05 NOTE — Assessment & Plan Note (Signed)
Well controlled, no changes to meds. Encouraged heart healthy diet such as the DASH diet and exercise as tolerated.  °

## 2018-01-05 NOTE — Assessment & Plan Note (Signed)
Tolerating statin, encouraged heart healthy diet, avoid trans fats, minimize simple carbs and saturated fats. Increase exercise as tolerated 

## 2018-01-05 NOTE — Assessment & Plan Note (Signed)
hgba1c to be checked, minimize simple carbs. Increase exercise as tolerated. Continue current meds  

## 2018-01-07 ENCOUNTER — Other Ambulatory Visit: Payer: Self-pay | Admitting: Family Medicine

## 2018-01-13 ENCOUNTER — Other Ambulatory Visit: Payer: Self-pay | Admitting: *Deleted

## 2018-01-15 ENCOUNTER — Ambulatory Visit: Payer: Medicare Other | Admitting: *Deleted

## 2018-01-23 DIAGNOSIS — Z96611 Presence of right artificial shoulder joint: Secondary | ICD-10-CM | POA: Diagnosis not present

## 2018-01-23 DIAGNOSIS — Z471 Aftercare following joint replacement surgery: Secondary | ICD-10-CM | POA: Diagnosis not present

## 2018-01-29 ENCOUNTER — Ambulatory Visit (INDEPENDENT_AMBULATORY_CARE_PROVIDER_SITE_OTHER): Payer: Medicare Other | Admitting: Medical

## 2018-01-29 ENCOUNTER — Encounter: Payer: Self-pay | Admitting: Medical

## 2018-01-29 VITALS — BP 112/83 | HR 79 | Temp 98.1°F | Resp 16 | Ht 73.0 in | Wt 224.6 lb

## 2018-01-29 DIAGNOSIS — L299 Pruritus, unspecified: Secondary | ICD-10-CM

## 2018-01-29 DIAGNOSIS — T7840XA Allergy, unspecified, initial encounter: Secondary | ICD-10-CM | POA: Diagnosis not present

## 2018-01-29 DIAGNOSIS — L089 Local infection of the skin and subcutaneous tissue, unspecified: Secondary | ICD-10-CM

## 2018-01-29 MED ORDER — PREDNISONE 10 MG PO TABS: ORAL_TABLET | ORAL | 0 refills | Status: DC

## 2018-01-29 MED ORDER — CEPHALEXIN 500 MG PO CAPS: 500.0000 mg | ORAL_CAPSULE | Freq: Two times a day (BID) | ORAL | 0 refills | Status: DC

## 2018-01-29 MED ORDER — HYDROXYZINE HCL 10 MG PO TABS: ORAL_TABLET | ORAL | 0 refills | Status: AC

## 2018-01-29 MED ORDER — CLOTRIMAZOLE-BETAMETHASONE 1-0.05 % EX CREA: 1.0000 "application " | TOPICAL_CREAM | Freq: Two times a day (BID) | CUTANEOUS | 0 refills | Status: DC

## 2018-01-29 NOTE — Progress Notes (Signed)
Subjective:    Patient ID: Louis Ivan., male    DOB: Dec 04, 1935, 82 y.o.   MRN: [id number]  HPI  Pt in with some left ankle itching for about 2 weeks. He remember that day he was watching lacrosse match outdoors. That night his ankle was itching while trying to sleep. Also he noted small drip of blood in are of itch.  He states itching present for about 2 weeks. Still itches. Faint tender.  Pt tried benadryl itch cream, neosporin, hydrocortisone, and calamine lotion.   Review of Systems  Constitutional: Negative for chills, fatigue and fever.  Respiratory: Negative for cough, chest tightness, wheezing and stridor.   Cardiovascular: Negative for chest pain and palpitations.  Gastrointestinal: Negative for abdominal distention, abdominal pain, constipation, diarrhea and vomiting.  Skin: Positive for rash.  Neurological: Negative for dizziness, tremors, weakness and headaches.  Psychiatric/Behavioral: Negative for behavioral problems and confusion.    Past Medical History:  Diagnosis Date  . Anemia   . Arthritis    Dr Amil Amen, [facility] rx- Rheumatology  . CKD (chronic kidney disease)   . Diabetes mellitus    takes 1 pill  . Diverticulosis   . GERD (gastroesophageal reflux disease)   . Hemochromatosis     Dr Henrene Pastor  . History of kidney stones 1980   passed spont.  Marland Kitchen HOH (hard of hearing)    congenital hearing loss  . Hx of adenomatous colonic polyps   . Hyperlipidemia   . Hypertension   . Hypothyroidism   . IBS (irritable bowel syndrome)   . Internal hemorrhoids   . Kidney stones, calcium oxalate    1985 & 2005  . Pulmonary embolism ([place]) 2015   hosp. with Heparin & then on Xarelto- 3 months or so  . Reflux esophagitis   . SBO (small bowel obstruction) ([place]) 2004  . Spinal stenosis    Dr.Ramos  . Testosterone deficiency    111 in 2007     Social History   Socioeconomic History  . Marital status: Married    Spouse name: Not on file  . Number of children:  Not on file  . Years of education: Not on file  . Highest education level: Not on file  Occupational History  . Not on file  Social Needs  . Financial resource strain: Not on file  . Food insecurity:    Worry: Not on file    Inability: Not on file  . Transportation needs:    Medical: Not on file    Non-medical: Not on file  Tobacco Use  . Smoking status: Former Smoker    Last attempt to quit: 10/01/1980    Years since quitting: 37.3  . Smokeless tobacco: Never Used  Substance and Sexual Activity  . Alcohol use: Yes    Alcohol/week: 0.0 oz    Comment: rare   . Drug use: No  . Sexual activity: Not Currently  Lifestyle  . Physical activity:    Days per week: Not on file    Minutes per session: Not on file  . Stress: Not on file  Relationships  . Social connections:    Talks on phone: Not on file    Gets together: Not on file    Attends religious service: Not on file    Active member of club or organization: Not on file    Attends meetings of clubs or organizations: Not on file    Relationship status: Not on file  . Intimate partner  violence:    Fear of current or ex partner: Not on file    Emotionally abused: Not on file    Physically abused: Not on file    Forced sexual activity: Not on file  Other Topics Concern  . Not on file  Social History Narrative   Daily caffeine     Past Surgical History:  Procedure Laterality Date  . APPENDECTOMY    . cataract  2005   bilaterally  . COLONOSCOPY W/ POLYPECTOMY  2005   tubular adenoma  . JOINT REPLACEMENT     L- shoulder, x2, replacement on both knees   . L4-5 & L5- S1 bilat facet injections  01/20/14    Dr Nelva Bush  . LUMBAR LAMINECTOMY WITH COFLEX 2 LEVEL N/A 07/30/2016   Procedure: Lumbar three- four Lumbar four - five Laminectomy and foraminotomy;  Surgeon: Kristeen Miss, MD;  Location: [facility];  Service: Neurosurgery;  Laterality: N/A;  L3-4 L4-5 Laminectomy with coflex  . lumbar nerve block      X 14; Dr Nelva Bush  .  MIDDLE EAR SURGERY  1980   Teflon tack   . REFRACTIVE SURGERY  2005   SE Ophth  . REVERSE SHOULDER ARTHROPLASTY Right 11/15/2017   Procedure: RIGHT REVERSE SHOULDER ARTHROPLASTY;  Surgeon: Netta Cedars, MD;  Location: [facility];  Service: Orthopedics;  Laterality: Right;  . ROTATOR CUFF REPAIR     3 on right shoulder  . TM replacement  1981  . TONSILLECTOMY AND ADENOIDECTOMY    . TOTAL KNEE ARTHROPLASTY   201 & 2012    bilaterally;Dr Alusio  . TOTAL SHOULDER REPLACEMENT     L    Family History  Problem Relation Age of Onset  . Diabetes Maternal Aunt   . Depression Brother        suicide  . Diabetes Father   . Stroke Father 102       MI in hospital for CVA  . Heart failure Mother        valvular disease  . Heart disease Paternal Aunt         2 aunts; 1 had MI @ ?> 30  . Heart disease Paternal Uncle           . Breast cancer Maternal Grandmother   . Colon cancer Neg Hx   . COPD Neg Hx   . Asthma Neg Hx     Allergies  Allergen Reactions  . Shellfish Allergy Anaphylaxis, Hives and Swelling    ANGIOEDEMA  . Garlic Nausea And Vomiting  . Iron Other (See Comments)    HEMACHROMATOSIS    Current Outpatient Medications on File Prior to Visit  Medication Sig Dispense Refill  . ALPRAZolam (XANAX) 0.5 MG tablet TAKE 1/2 TABLET BY MOUTH EVERY 8 TO 12 HOURS AS NEEDED (Patient taking differently: Take 0.5 mg by mouth at bedtime as needed for anxiety. ) 30 tablet 0  . amoxicillin (AMOXIL) 500 MG capsule     . atorvastatin (LIPITOR) 20 MG tablet TAKE 1 TABLET DAILY AT 6 P.M. 90 tablet 1  . CALCIUM-VITAMIN D PO Take 1 tablet by mouth daily.     Marland Kitchen ELIQUIS 5 MG TABS tablet TAKE 1 TABLET TWICE A DAY 180 tablet 0  . fenofibrate 160 MG tablet TAKE 1 TABLET(160 MG) BY MOUTH DAILY 90 tablet 1  . fluticasone (FLONASE) 50 MCG/ACT nasal spray Place 2 sprays into both nostrils daily. (Patient taking differently: Place 2 sprays into both nostrils daily as needed for allergies  or rhinitis. ) 16 g 1    . folic acid (FOLVITE) 1 MG tablet Take 1 mg by mouth daily.      Marland Kitchen glucose blood test strip OneTouch Verio strips  U ONCE D UTD    . HYDROcodone-acetaminophen (NORCO) 10-325 MG tablet Take 1 tablet by mouth every 4 (four) hours as needed for moderate pain or severe pain (pain). 30 tablet 0  . hydrocortisone (ANUSOL-HC) 2.5 % rectal cream Place rectally 3 (three) times daily as needed for hemorrhoids or anal itching. 30 g 3  . levothyroxine (SYNTHROID, LEVOTHROID) 125 MCG tablet TAKE 1 TABLET(125 MCG) BY MOUTH DAILY 90 tablet 0  . Liniments (BLUE-EMU SUPER STRENGTH EX) Apply 1 application topically daily as needed (pain).    . methocarbamol (ROBAXIN) 500 MG tablet Take 1 tablet (500 mg total) by mouth every 6 (six) hours as needed for muscle spasms. 40 tablet 3  . methotrexate (RHEUMATREX) 2.5 MG tablet Take 20 mg by mouth every Monday.     . multivitamin (THERAGRAN) per tablet Take 1 tablet by mouth daily.      . ondansetron (ZOFRAN) 4 MG tablet Take 1 tablet (4 mg total) by mouth every 8 (eight) hours as needed for nausea or vomiting. 20 tablet 0  . ondansetron (ZOFRAN) 4 MG tablet TAKE 1 TABLET EVERY 6 HOURS AS NEEDED FOR NAUSEA OR VOMITING 40 tablet 0  . pantoprazole (PROTONIX) 40 MG tablet Take 1 tablet (40 mg total) daily by mouth. 90 tablet 1  . pioglitazone (ACTOS) 15 MG tablet TAKE 1 TABLET DAILY 90 tablet 3  . polyethylene glycol (MIRALAX) packet Take 17 g by mouth daily as needed for moderate constipation. OTC 30 each 3  . vitamin B-12 (CYANOCOBALAMIN) 1000 MCG tablet Take 1,000 mcg by mouth daily.     No current facility-administered medications on file prior to visit.     BP 112/83   Pulse 79   Temp 98.1 F (36.7 C) (Oral)   Resp 16   Ht 6\' 1"  (1.854 m)   Wt 224 lb 9.6 oz (101.9 kg)   SpO2 98%   BMI 29.63 kg/m      Objective:   Physical Exam  General Mental Status- Alert. General Appearance- Not in acute distress.   Skin General: Color- Normal Color. Moisture-  Normal Moisture.  Neck Carotid Arteries- Normal color. Moisture- Normal Moisture. No carotid bruits. No JVD.  Chest and Lung Exam Auscultation: Breath Sounds:-Normal.  Cardiovascular Auscultation:Rythm- Regular. Murmurs & Other Heart Sounds:Auscultation of the heart reveals- No Murmurs.  Abdomen Inspection:-Inspeection Normal. Palpation/Percussion:Note:No mass. Palpation and Percussion of the abdomen reveal- Non Tender, Non Distended + BS, no rebound or guarding.  Neurologic Cranial Nerve exam:- CN III-XII intact(No nystagmus), symmetric smile. Strength:- 5/5 equal and symmetric strength both upper and lower extremities.  Skin- medial distal calf 8 cm x 3 cm faint reddish pink area. No warmth. Mild dry appearance to skin. moderate tender to light palpation and faint warmth.      Assessment & Plan:  You do appear to have had allergic reaction over the last 2 weeks.  By exam and tenderness over the area, I do think you may have secondary bacterial infection as well.  For persisting allergic reaction, I did prescribe 4-day taper dose of prednisone.  For the severe itching and I did prescribe hydroxyzine.  Sedation is a side effect so I will only recommend taking it at night to help stop the itching and allow him to sleep.  Prescription of Lotrisone prescription written as well.  While on prednisone please eat low sugar diet.    Prescription of Keflex written for secondary bacterial infection following the severe itching.  Follow-up this coming Tuesday morning or as needed.  Louis Pai, PA-C

## 2018-01-29 NOTE — Patient Instructions (Addendum)
You do appear to have had allergic reaction over the last 2 weeks.  By exam and tenderness over the area, I do think you may have secondary bacterial infection as well.  For persisting allergic reaction, I did prescribe 4-day taper dose of prednisone.  For the severe itching and I did prescribe hydroxyzine.  Sedation is a side effect of hydroxyzine so I will only recommend taking it at night to help stop the itching and allow you to sleep.  Prescription of Lotrisone prescription written as well.  While on prednisone please eat low sugar diet.    Prescription of Keflex written for secondary bacterial infection following the severe itching.  Follow-up this coming Tuesday morning or as needed.

## 2018-02-03 ENCOUNTER — Other Ambulatory Visit: Payer: Self-pay | Admitting: Family Medicine

## 2018-02-03 ENCOUNTER — Ambulatory Visit (INDEPENDENT_AMBULATORY_CARE_PROVIDER_SITE_OTHER): Payer: Medicare Other | Admitting: Medical

## 2018-02-03 ENCOUNTER — Encounter: Payer: Self-pay | Admitting: Medical

## 2018-02-03 VITALS — BP 124/56 | HR 83 | Temp 98.7°F | Resp 16 | Ht 73.0 in | Wt 225.0 lb

## 2018-02-03 DIAGNOSIS — T7840XA Allergy, unspecified, initial encounter: Secondary | ICD-10-CM | POA: Diagnosis not present

## 2018-02-03 DIAGNOSIS — L089 Local infection of the skin and subcutaneous tissue, unspecified: Secondary | ICD-10-CM

## 2018-02-03 NOTE — Patient Instructions (Signed)
Your ankle/foot region allergic reaction and secondary bacterial infection are almost 100% better now. Recommend that you take the remaining days of keflex and continue the lotrisone. Let me know if any residual signs and symptoms persist after Thursday let me know.  Follow up as needed.

## 2018-02-03 NOTE — Progress Notes (Signed)
Subjective:    Patient ID: Louis [name]., male    DOB: [birth year], 82 y.o.   MRN: [id number]  HPI   Pt in for follow up.  He states he is about 75-80% better with tenderness. The itching is about 90% better.  He has 2-3 more days of the antibiotic left.  He states prednisone seemed to help him very quickly. Also still using the lotrisone.    Review of Systems  Constitutional: Negative for chills, fatigue and fever.  Respiratory: Negative for cough, chest tightness, shortness of breath and wheezing.   Cardiovascular: Negative for chest pain and palpitations.  Musculoskeletal:       Faint tender medial ankle area.  Skin:       Much better now. See hpi.  Hematological: Negative for adenopathy. Does not bruise/bleed easily.  Psychiatric/Behavioral: Negative for behavioral problems and confusion.    Past Medical History:  Diagnosis Date  . Anemia   . Arthritis    Dr Amil Amen, [facility] rx- Rheumatology  . CKD (chronic kidney disease)   . Diabetes mellitus    takes 1 pill  . Diverticulosis   . GERD (gastroesophageal reflux disease)   . Hemochromatosis     Dr Henrene Pastor  . History of kidney stones 1980   passed spont.  Marland Kitchen HOH (hard of hearing)    congenital hearing loss  . Hx of adenomatous colonic polyps   . Hyperlipidemia   . Hypertension   . Hypothyroidism   . IBS (irritable bowel syndrome)   . Internal hemorrhoids   . Kidney stones, calcium oxalate    1985 & 2005  . Pulmonary embolism ([place]) 2015   hosp. with Heparin & then on Xarelto- 3 months or so  . Reflux esophagitis   . SBO (small bowel obstruction) ([place]) 2004  . Spinal stenosis    Dr.Ramos  . Testosterone deficiency    111 in 2007     Social History   Socioeconomic History  . Marital status: Married    Spouse name: Not on file  . Number of children: Not on file  . Years of education: Not on file  . Highest education level: Not on file  Occupational History  . Not on file  Social Needs  .  Financial resource strain: Not on file  . Food insecurity:    Worry: Not on file    Inability: Not on file  . Transportation needs:    Medical: Not on file    Non-medical: Not on file  Tobacco Use  . Smoking status: Former Smoker    Last attempt to quit: 10/01/1980    Years since quitting: 37.3  . Smokeless tobacco: Never Used  Substance and Sexual Activity  . Alcohol use: Yes    Alcohol/week: 0.0 oz    Comment: rare   . Drug use: No  . Sexual activity: Not Currently  Lifestyle  . Physical activity:    Days per week: Not on file    Minutes per session: Not on file  . Stress: Not on file  Relationships  . Social connections:    Talks on phone: Not on file    Gets together: Not on file    Attends religious service: Not on file    Active member of club or organization: Not on file    Attends meetings of clubs or organizations: Not on file    Relationship status: Not on file  . Intimate partner violence:    Fear of current  or ex partner: Not on file    Emotionally abused: Not on file    Physically abused: Not on file    Forced sexual activity: Not on file  Other Topics Concern  . Not on file  Social History Narrative   Daily caffeine     Past Surgical History:  Procedure Laterality Date  . APPENDECTOMY    . cataract  2005   bilaterally  . COLONOSCOPY W/ POLYPECTOMY  2005   tubular adenoma  . JOINT REPLACEMENT     L- shoulder, x2, replacement on both knees   . L4-5 & L5- S1 bilat facet injections  01/20/14    Dr Nelva Bush  . LUMBAR LAMINECTOMY WITH COFLEX 2 LEVEL N/A 07/30/2016   Procedure: Lumbar three- four Lumbar four - five Laminectomy and foraminotomy;  Surgeon: Kristeen Miss, MD;  Location: [facility];  Service: Neurosurgery;  Laterality: N/A;  L3-4 L4-5 Laminectomy with coflex  . lumbar nerve block      X 14; Dr Nelva Bush  . MIDDLE EAR SURGERY  1980   Teflon tack   . REFRACTIVE SURGERY  2005   SE Ophth  . REVERSE SHOULDER ARTHROPLASTY Right 11/15/2017   Procedure: RIGHT  REVERSE SHOULDER ARTHROPLASTY;  Surgeon: Netta Cedars, MD;  Location: [facility];  Service: Orthopedics;  Laterality: Right;  . ROTATOR CUFF REPAIR     3 on right shoulder  . TM replacement  1981  . TONSILLECTOMY AND ADENOIDECTOMY    . TOTAL KNEE ARTHROPLASTY   201 & 2012    bilaterally;Dr Alusio  . TOTAL SHOULDER REPLACEMENT     L    Family History  Problem Relation Age of Onset  . Diabetes Maternal Aunt   . Depression Brother        suicide  . Diabetes Father   . Stroke Father 18       MI in hospital for CVA  . Heart failure Mother        valvular disease  . Heart disease Paternal Aunt         2 aunts; 1 had MI @ ?> 18  . Heart disease Paternal Uncle           . Breast cancer Maternal Grandmother   . Colon cancer Neg Hx   . COPD Neg Hx   . Asthma Neg Hx     Allergies  Allergen Reactions  . Shellfish Allergy Anaphylaxis, Hives and Swelling    ANGIOEDEMA  . Garlic Nausea And Vomiting  . Iron Other (See Comments)    HEMACHROMATOSIS    Current Outpatient Medications on File Prior to Visit  Medication Sig Dispense Refill  . ALPRAZolam (XANAX) 0.5 MG tablet TAKE 1/2 TABLET BY MOUTH EVERY 8 TO 12 HOURS AS NEEDED (Patient taking differently: Take 0.5 mg by mouth at bedtime as needed for anxiety. ) 30 tablet 0  . amoxicillin (AMOXIL) 500 MG capsule     . atorvastatin (LIPITOR) 20 MG tablet TAKE 1 TABLET DAILY AT 6 P.M. 90 tablet 1  . CALCIUM-VITAMIN D PO Take 1 tablet by mouth daily.     . cephALEXin (KEFLEX) 500 MG capsule Take 1 capsule (500 mg total) by mouth 2 (two) times daily. 14 capsule 0  . clotrimazole-betamethasone (LOTRISONE) cream Apply 1 application topically 2 (two) times daily. 30 g 0  . ELIQUIS 5 MG TABS tablet TAKE 1 TABLET TWICE A DAY 180 tablet 0  . fenofibrate 160 MG tablet TAKE 1 TABLET(160 MG) BY MOUTH DAILY 90  tablet 1  . fluticasone (FLONASE) 50 MCG/ACT nasal spray Place 2 sprays into both nostrils daily. (Patient taking differently: Place 2 sprays into  both nostrils daily as needed for allergies or rhinitis. ) 16 g 1  . folic acid (FOLVITE) 1 MG tablet Take 1 mg by mouth daily.      Marland Kitchen glucose blood test strip OneTouch Verio strips  U ONCE D UTD    . HYDROcodone-acetaminophen (NORCO) 10-325 MG tablet Take 1 tablet by mouth every 4 (four) hours as needed for moderate pain or severe pain (pain). 30 tablet 0  . hydrocortisone (ANUSOL-HC) 2.5 % rectal cream Place rectally 3 (three) times daily as needed for hemorrhoids or anal itching. 30 g 3  . hydrOXYzine (ATARAX/VISTARIL) 10 MG tablet 1 tab po q hs  as needed for itching 5 tablet 0  . levothyroxine (SYNTHROID, LEVOTHROID) 125 MCG tablet TAKE 1 TABLET(125 MCG) BY MOUTH DAILY 90 tablet 0  . Liniments (BLUE-EMU SUPER STRENGTH EX) Apply 1 application topically daily as needed (pain).    . methocarbamol (ROBAXIN) 500 MG tablet Take 1 tablet (500 mg total) by mouth every 6 (six) hours as needed for muscle spasms. 40 tablet 3  . methotrexate (RHEUMATREX) 2.5 MG tablet Take 20 mg by mouth every Monday.     . multivitamin (THERAGRAN) per tablet Take 1 tablet by mouth daily.      . ondansetron (ZOFRAN) 4 MG tablet Take 1 tablet (4 mg total) by mouth every 8 (eight) hours as needed for nausea or vomiting. 20 tablet 0  . ondansetron (ZOFRAN) 4 MG tablet TAKE 1 TABLET EVERY 6 HOURS AS NEEDED FOR NAUSEA OR VOMITING 40 tablet 0  . pantoprazole (PROTONIX) 40 MG tablet Take 1 tablet (40 mg total) daily by mouth. 90 tablet 1  . pioglitazone (ACTOS) 15 MG tablet TAKE 1 TABLET DAILY 90 tablet 3  . polyethylene glycol (MIRALAX) packet Take 17 g by mouth daily as needed for moderate constipation. OTC 30 each 3  . predniSONE (DELTASONE) 10 MG tablet 4 tab po day 1, 3 tab po day 2, 2 tab po day 3, 1 tab po day4 10 tablet 0  . vitamin B-12 (CYANOCOBALAMIN) 1000 MCG tablet Take 1,000 mcg by mouth daily.     No current facility-administered medications on file prior to visit.     BP (!) 124/56 (BP Location: Right Arm,  Patient Position: Sitting, Cuff Size: Large)   Pulse 83   Temp 98.7 F (37.1 C) (Oral)   Resp 16   Ht 6\' 1"  (1.854 m)   Wt 225 lb (102.1 kg)   SpO2 100%   BMI 29.69 kg/m       Objective:   Physical Exam  General- No acute distress. Pleasant patient. Neck- Full range of motion, no jvd Lungs- Clear, even and unlabored. Heart- regular rate and rhythm. Neurologic- CNII- XII grossly intact.  Skin-  rt medial distal calf no longer has is no longer faint reddish pink area. No warmth. Mild dry appearance to skin. Faint tender to light palpation but no warmth.       Assessment & Plan:  Your ankle/foot region allergic reaction and secondary bacterial infection are almost 100% better now. Recommend that you take the remaining days of keflex and continue the lotrisone. Let me know if any residual signs and symptoms persist after Thursday let me know.  Follow up as needed.

## 2018-02-07 ENCOUNTER — Other Ambulatory Visit: Payer: Self-pay | Admitting: Family Medicine

## 2018-02-11 ENCOUNTER — Ambulatory Visit (INDEPENDENT_AMBULATORY_CARE_PROVIDER_SITE_OTHER): Payer: Medicare Other | Admitting: Medical

## 2018-02-11 ENCOUNTER — Encounter: Payer: Self-pay | Admitting: Medical

## 2018-02-11 VITALS — BP 132/56 | HR 68 | Temp 98.3°F | Resp 16 | Ht 73.0 in | Wt 221.6 lb

## 2018-02-11 DIAGNOSIS — K146 Glossodynia: Secondary | ICD-10-CM | POA: Diagnosis not present

## 2018-02-11 DIAGNOSIS — B370 Candidal stomatitis: Secondary | ICD-10-CM

## 2018-02-11 DIAGNOSIS — K130 Diseases of lips: Secondary | ICD-10-CM | POA: Diagnosis not present

## 2018-02-11 MED ORDER — FLUCONAZOLE 150 MG PO TABS: ORAL_TABLET | ORAL | 0 refills | Status: DC

## 2018-02-11 MED ORDER — MAGIC MOUTHWASH
ORAL | 0 refills | Status: DC
Start: 2018-02-11 — End: 2018-02-17

## 2018-02-11 MED FILL — MAGIC MW (MAAL,LIDO,BEN): 20 days supply | Qty: 200 | Fill #0

## 2018-02-11 NOTE — Progress Notes (Signed)
Subjective:    Patient ID: Louis [name]., male    DOB: 12-05-1935, 82 y.o.   MRN: [id number]  HPI  Pt in with some gum soreness, tender tongue and peeling of lips.   No sob, no fever, or chills.   Pt states his above symptoms started to notice at tail end of antibiotic use for his keflex use. Pt states tongue soreness was worse about 4-5 days ago. Since yesterday he states feeling some dramatic improvement.  His dentures do hurt when he uses.    Review of Systems  Constitutional: Negative for chills, fatigue and fever.  HENT: Negative for congestion, ear pain, sinus pressure, sinus pain, sneezing and sore throat.        See hpi. And mouth exam.  Respiratory: Negative for cough, chest tightness, shortness of breath and wheezing.   Cardiovascular: Negative for chest pain and palpitations.  Gastrointestinal: Negative for abdominal pain.  Musculoskeletal: Negative for back pain.  Skin: Negative for rash.  Neurological: Negative for dizziness, speech difficulty, weakness and light-headedness.  Hematological: Negative for adenopathy. Does not bruise/bleed easily.  Psychiatric/Behavioral: Negative for behavioral problems and confusion.    Past Medical History:  Diagnosis Date  . Anemia   . Arthritis    Dr Amil Amen, [facility] rx- Rheumatology  . CKD (chronic kidney disease)   . Diabetes mellitus    takes 1 pill  . Diverticulosis   . GERD (gastroesophageal reflux disease)   . Hemochromatosis     Dr Henrene Pastor  . History of kidney stones 1980   passed spont.  Marland Kitchen HOH (hard of hearing)    congenital hearing loss  . Hx of adenomatous colonic polyps   . Hyperlipidemia   . Hypertension   . Hypothyroidism   . IBS (irritable bowel syndrome)   . Internal hemorrhoids   . Kidney stones, calcium oxalate    1985 & 2005  . Pulmonary embolism ([place]) 2015   hosp. with Heparin & then on Xarelto- 3 months or so  . Reflux esophagitis   . SBO (small bowel obstruction) ([place]) 2004  . Spinal  stenosis    Dr.Ramos  . Testosterone deficiency    111 in 2007     Social History   Socioeconomic History  . Marital status: Married    Spouse name: Not on file  . Number of children: Not on file  . Years of education: Not on file  . Highest education level: Not on file  Occupational History  . Not on file  Social Needs  . Financial resource strain: Not on file  . Food insecurity:    Worry: Not on file    Inability: Not on file  . Transportation needs:    Medical: Not on file    Non-medical: Not on file  Tobacco Use  . Smoking status: Former Smoker    Last attempt to quit: 10/01/1980    Years since quitting: 37.3  . Smokeless tobacco: Never Used  Substance and Sexual Activity  . Alcohol use: Yes    Alcohol/week: 0.0 oz    Comment: rare   . Drug use: No  . Sexual activity: Not Currently  Lifestyle  . Physical activity:    Days per week: Not on file    Minutes per session: Not on file  . Stress: Not on file  Relationships  . Social connections:    Talks on phone: Not on file    Gets together: Not on file    Attends religious  service: Not on file    Active member of club or organization: Not on file    Attends meetings of clubs or organizations: Not on file    Relationship status: Not on file  . Intimate partner violence:    Fear of current or ex partner: Not on file    Emotionally abused: Not on file    Physically abused: Not on file    Forced sexual activity: Not on file  Other Topics Concern  . Not on file  Social History Narrative   Daily caffeine     Past Surgical History:  Procedure Laterality Date  . APPENDECTOMY    . cataract  2005   bilaterally  . COLONOSCOPY W/ POLYPECTOMY  2005   tubular adenoma  . JOINT REPLACEMENT     L- shoulder, x2, replacement on both knees   . L4-5 & L5- S1 bilat facet injections  01/20/14    Dr Nelva Bush  . LUMBAR LAMINECTOMY WITH COFLEX 2 LEVEL N/A 07/30/2016   Procedure: Lumbar three- four Lumbar four - five Laminectomy  and foraminotomy;  Surgeon: Kristeen Miss, MD;  Location: [facility];  Service: Neurosurgery;  Laterality: N/A;  L3-4 L4-5 Laminectomy with coflex  . lumbar nerve block      X 14; Dr Nelva Bush  . MIDDLE EAR SURGERY  1980   Teflon tack   . REFRACTIVE SURGERY  2005   SE Ophth  . REVERSE SHOULDER ARTHROPLASTY Right 11/15/2017   Procedure: RIGHT REVERSE SHOULDER ARTHROPLASTY;  Surgeon: Netta Cedars, MD;  Location: [facility];  Service: Orthopedics;  Laterality: Right;  . ROTATOR CUFF REPAIR     3 on right shoulder  . TM replacement  1981  . TONSILLECTOMY AND ADENOIDECTOMY    . TOTAL KNEE ARTHROPLASTY   201 & 2012    bilaterally;Dr Alusio  . TOTAL SHOULDER REPLACEMENT     L    Family History  Problem Relation Age of Onset  . Diabetes Maternal Aunt   . Depression Brother        suicide  . Diabetes Father   . Stroke Father 48       MI in hospital for CVA  . Heart failure Mother        valvular disease  . Heart disease Paternal Aunt         2 aunts; 1 had MI @ ?> 3  . Heart disease Paternal Uncle           . Breast cancer Maternal Grandmother   . Colon cancer Neg Hx   . COPD Neg Hx   . Asthma Neg Hx     Allergies  Allergen Reactions  . Shellfish Allergy Anaphylaxis, Hives and Swelling    ANGIOEDEMA  . Garlic Nausea And Vomiting  . Iron Other (See Comments)    HEMACHROMATOSIS    Current Outpatient Medications on File Prior to Visit  Medication Sig Dispense Refill  . ALPRAZolam (XANAX) 0.5 MG tablet TAKE 1/2 TABLET BY MOUTH EVERY 8 TO 12 HOURS AS NEEDED (Patient taking differently: Take 0.5 mg by mouth at bedtime as needed for anxiety. ) 30 tablet 0  . amoxicillin (AMOXIL) 500 MG capsule     . atorvastatin (LIPITOR) 20 MG tablet TAKE 1 TABLET DAILY AT 6 P.M. 90 tablet 1  . CALCIUM-VITAMIN D PO Take 1 tablet by mouth daily.     . clotrimazole-betamethasone (LOTRISONE) cream Apply 1 application topically 2 (two) times daily. 30 g 0  . ELIQUIS 5 MG  TABS tablet TAKE 1 TABLET TWICE A DAY  180 tablet 0  . fenofibrate 160 MG tablet TAKE 1 TABLET(160 MG) BY MOUTH DAILY 90 tablet 1  . fluticasone (FLONASE) 50 MCG/ACT nasal spray Place 2 sprays into both nostrils daily. (Patient taking differently: Place 2 sprays into both nostrils daily as needed for allergies or rhinitis. ) 16 g 1  . folic acid (FOLVITE) 1 MG tablet Take 1 mg by mouth daily.      Marland Kitchen glucose blood test strip OneTouch Verio strips  U ONCE D UTD    . HYDROcodone-acetaminophen (NORCO) 10-325 MG tablet Take 1 tablet by mouth every 4 (four) hours as needed for moderate pain or severe pain (pain). 30 tablet 0  . hydrocortisone (ANUSOL-HC) 2.5 % rectal cream Place rectally 3 (three) times daily as needed for hemorrhoids or anal itching. 30 g 3  . hydrOXYzine (ATARAX/VISTARIL) 10 MG tablet 1 tab po q hs  as needed for itching 5 tablet 0  . levothyroxine (SYNTHROID, LEVOTHROID) 125 MCG tablet TAKE 1 TABLET DAILY 90 tablet 0  . Liniments (BLUE-EMU SUPER STRENGTH EX) Apply 1 application topically daily as needed (pain).    . methocarbamol (ROBAXIN) 500 MG tablet Take 1 tablet (500 mg total) by mouth every 6 (six) hours as needed for muscle spasms. 40 tablet 3  . methotrexate (RHEUMATREX) 2.5 MG tablet Take 20 mg by mouth every Monday.     . multivitamin (THERAGRAN) per tablet Take 1 tablet by mouth daily.      . ondansetron (ZOFRAN) 4 MG tablet Take 1 tablet (4 mg total) by mouth every 8 (eight) hours as needed for nausea or vomiting. 20 tablet 0  . ondansetron (ZOFRAN) 4 MG tablet TAKE 1 TABLET EVERY 6 HOURS AS NEEDED FOR NAUSEA OR VOMITING 40 tablet 0  . pantoprazole (PROTONIX) 40 MG tablet TAKE 1 TABLET DAILY 90 tablet 1  . pioglitazone (ACTOS) 15 MG tablet TAKE 1 TABLET DAILY 90 tablet 3  . polyethylene glycol (MIRALAX) packet Take 17 g by mouth daily as needed for moderate constipation. OTC 30 each 3  . predniSONE (DELTASONE) 10 MG tablet 4 tab po day 1, 3 tab po day 2, 2 tab po day 3, 1 tab po day4 10 tablet 0  . vitamin  B-12 (CYANOCOBALAMIN) 1000 MCG tablet Take 1,000 mcg by mouth daily.     No current facility-administered medications on file prior to visit.     BP (!) 132/56   Pulse 68   Temp 98.3 F (36.8 C) (Oral)   Resp 16   Ht 6\' 1"  (1.854 m)   Wt 221 lb 9.6 oz (100.5 kg)   SpO2 99%   BMI 29.24 kg/m       Objective:   Physical Exam   General  Mental Status - Alert. General Appearance - Well groomed. Not in acute distress.  Skin Rashes- No Rashes.  HEENT Head- Normal. Ear Auditory Canal - Left- Normal. Right - Normal.Tympanic Membrane- Left- Normal. Right- Normal. Eye Sclera/Conjunctiva- Left- Normal. Right- Normal. Nose & Sinuses Nasal Mucosa- Left-  Boggy and Congested. Right-  Boggy and  Congested.Bilateral maxillary and frontal sinus pressure. Mouth & Throat Lips: Upper Lip- Normal: no dryness, cracking, pallor, cyanosis, or vesicular eruption. Lower Lip-Normal: mild cracking and peeling of his lips. Mild crack in corners/very minimal. Buccal Mucosa- Bilateral- inflamed redness of buccal mucosa.(bright redness). Mild beefy redness to tongue.   Oropharynx- No Discharge or Erythema. Tonsils: Characteristics- Bilateral- No Erythema or Congestion.  Size/Enlargement- Bilateral- No enlargement. Discharge- bilateral-None.  Neck Neck- Supple. No Masses.   Chest and Lung Exam Auscultation: Breath Sounds:-Clear even and unlabored.  Cardiovascular Auscultation:Rythm- Regular, rate and rhythm. Murmurs & Other Heart Sounds:Ausculatation of the heart reveal- No Murmurs.  Lymphatic Head & Neck General Head & Neck Lymphatics: Bilateral: Description- No Localized lymphadenopathy.      Assessment & Plan:  You do appear to have thrush on exam.  Your symptoms also occurred at tail end of antibiotic use.  I am prescribing Diflucan to use today and repeat tab in 3 days.  Also providing Magic mouthwash to use as directed.  You might also have some angular cheilitis as well as dry  chapped lips.  Diflucan may help with this but also recommend you apply Chapstick twice daily.  Let me know if your lips do not improve.  I do think that she will progressively improve and the almost completely better by Monday of next week.  If not then we can see you on Monday or Tuesday.  If any worsening or changing symptoms we can see you before the weekend.  Mackie Pai, PA-C

## 2018-02-11 NOTE — Patient Instructions (Signed)
You do appear to have thrush on exam.  Your symptoms also occurred at tail end of antibiotic use.  I am prescribing Diflucan to use today and repeat tab in 3 days.  Also providing Magic mouthwash to use as directed.  You might also have some angular cheilitis as well as dry chapped lips.  Diflucan may help with this but also recommend you apply Chapstick twice daily.  Let me know if your lips do not improve.  I do think that she will progressively improve and the almost completely better by Monday of next week.  If not then we can see you on Monday or Tuesday.  If any worsening or changing symptoms we can see you before the weekend.

## 2018-02-17 ENCOUNTER — Encounter: Payer: Self-pay | Admitting: Medical

## 2018-02-17 ENCOUNTER — Ambulatory Visit (INDEPENDENT_AMBULATORY_CARE_PROVIDER_SITE_OTHER): Payer: Medicare Other | Admitting: Medical

## 2018-02-17 VITALS — BP 116/66 | HR 78 | Wt 220.0 lb

## 2018-02-17 DIAGNOSIS — K1370 Unspecified lesions of oral mucosa: Secondary | ICD-10-CM

## 2018-02-17 DIAGNOSIS — K140 Glossitis: Secondary | ICD-10-CM | POA: Diagnosis not present

## 2018-02-17 MED ORDER — MAGIC MOUTHWASH: ORAL | 0 refills | Status: DC

## 2018-02-17 MED FILL — MAGIC MW LID/MAAL/DP1:1:1: 2 | 10 days supply | Qty: 200 | Fill #0

## 2018-02-17 NOTE — Progress Notes (Signed)
Subjective:    Patient ID: Louis [name]., male    DOB: 09/09/1936, 82 y.o.   MRN: [id number]  HPI  Pt has some peeling of his lower lip and small blister eruption on top of his inner lip.  He describes in his upper lip/gum strs will get raised area/blister. Does not hurt much. Skin will peel/ deflate and then raised area will reoccur/ reform in a day and half.  His lower lips large yellow peeling to skin. Slight blistery appearance.  Magic mouthwash helped a lot very briefly. I also wrote him for diflucan.(1 tab day one then repeat after 3 days.  Pt does have geographic tongue appearance today.  For 2 days    Review of Systems  Constitutional: Negative for chills, fatigue and fever.  HENT:       Lesions on upper lip and lower lip.  Respiratory: Negative for cough, chest tightness, shortness of breath and wheezing.   Cardiovascular: Negative for chest pain and palpitations.  Musculoskeletal: Negative for back pain and neck pain.  Skin: Negative for rash.  Hematological: Negative for adenopathy. Does not bruise/bleed easily.  Psychiatric/Behavioral: Negative for behavioral problems, confusion and decreased concentration.   Past Medical History:  Diagnosis Date  . Anemia   . Arthritis    Dr Amil Amen, [facility] rx- Rheumatology  . CKD (chronic kidney disease)   . Diabetes mellitus    takes 1 pill  . Diverticulosis   . GERD (gastroesophageal reflux disease)   . Hemochromatosis     Dr Henrene Pastor  . History of kidney stones 1980   passed spont.  Marland Kitchen HOH (hard of hearing)    congenital hearing loss  . Hx of adenomatous colonic polyps   . Hyperlipidemia   . Hypertension   . Hypothyroidism   . IBS (irritable bowel syndrome)   . Internal hemorrhoids   . Kidney stones, calcium oxalate    1985 & 2005  . Pulmonary embolism ([place]) 2015   hosp. with Heparin & then on Xarelto- 3 months or so  . Reflux esophagitis   . SBO (small bowel obstruction) ([place]) 2004  . Spinal stenosis    Dr.Ramos  . Testosterone deficiency    111 in 2007     Social History   Socioeconomic History  . Marital status: Married    Spouse name: Not on file  . Number of children: Not on file  . Years of education: Not on file  . Highest education level: Not on file  Occupational History  . Not on file  Social Needs  . Financial resource strain: Not on file  . Food insecurity:    Worry: Not on file    Inability: Not on file  . Transportation needs:    Medical: Not on file    Non-medical: Not on file  Tobacco Use  . Smoking status: Former Smoker    Last attempt to quit: 10/01/1980    Years since quitting: 37.4  . Smokeless tobacco: Never Used  Substance and Sexual Activity  . Alcohol use: Yes    Alcohol/week: 0.0 oz    Comment: rare   . Drug use: No  . Sexual activity: Not Currently  Lifestyle  . Physical activity:    Days per week: Not on file    Minutes per session: Not on file  . Stress: Not on file  Relationships  . Social connections:    Talks on phone: Not on file    Gets together: Not on file  Attends religious service: Not on file    Active member of club or organization: Not on file    Attends meetings of clubs or organizations: Not on file    Relationship status: Not on file  . Intimate partner violence:    Fear of current or ex partner: Not on file    Emotionally abused: Not on file    Physically abused: Not on file    Forced sexual activity: Not on file  Other Topics Concern  . Not on file  Social History Narrative   Daily caffeine     Past Surgical History:  Procedure Laterality Date  . APPENDECTOMY    . cataract  2005   bilaterally  . COLONOSCOPY W/ POLYPECTOMY  2005   tubular adenoma  . JOINT REPLACEMENT     L- shoulder, x2, replacement on both knees   . L4-5 & L5- S1 bilat facet injections  01/20/14    Dr Nelva Bush  . LUMBAR LAMINECTOMY WITH COFLEX 2 LEVEL N/A 07/30/2016   Procedure: Lumbar three- four Lumbar four - five Laminectomy and  foraminotomy;  Surgeon: Kristeen Miss, MD;  Location: [facility];  Service: Neurosurgery;  Laterality: N/A;  L3-4 L4-5 Laminectomy with coflex  . lumbar nerve block      X 14; Dr Nelva Bush  . MIDDLE EAR SURGERY  1980   Teflon tack   . REFRACTIVE SURGERY  2005   SE Ophth  . REVERSE SHOULDER ARTHROPLASTY Right 11/15/2017   Procedure: RIGHT REVERSE SHOULDER ARTHROPLASTY;  Surgeon: Netta Cedars, MD;  Location: [facility];  Service: Orthopedics;  Laterality: Right;  . ROTATOR CUFF REPAIR     3 on right shoulder  . TM replacement  1981  . TONSILLECTOMY AND ADENOIDECTOMY    . TOTAL KNEE ARTHROPLASTY   201 & 2012    bilaterally;Dr Alusio  . TOTAL SHOULDER REPLACEMENT     L    Family History  Problem Relation Age of Onset  . Diabetes Maternal Aunt   . Depression Brother        suicide  . Diabetes Father   . Stroke Father 68       MI in hospital for CVA  . Heart failure Mother        valvular disease  . Heart disease Paternal Aunt         2 aunts; 1 had MI @ ?> 35  . Heart disease Paternal Uncle           . Breast cancer Maternal Grandmother   . Colon cancer Neg Hx   . COPD Neg Hx   . Asthma Neg Hx     Allergies  Allergen Reactions  . Shellfish Allergy Anaphylaxis, Hives and Swelling    ANGIOEDEMA  . Garlic Nausea And Vomiting  . Iron Other (See Comments)    HEMACHROMATOSIS    Current Outpatient Medications on File Prior to Visit  Medication Sig Dispense Refill  . ALPRAZolam (XANAX) 0.5 MG tablet TAKE 1/2 TABLET BY MOUTH EVERY 8 TO 12 HOURS AS NEEDED (Patient taking differently: Take 0.5 mg by mouth at bedtime as needed for anxiety. ) 30 tablet 0  . amoxicillin (AMOXIL) 500 MG capsule     . atorvastatin (LIPITOR) 20 MG tablet TAKE 1 TABLET DAILY AT 6 P.M. 90 tablet 1  . CALCIUM-VITAMIN D PO Take 1 tablet by mouth daily.     . clotrimazole-betamethasone (LOTRISONE) cream Apply 1 application topically 2 (two) times daily. 30 g 0  . ELIQUIS  5 MG TABS tablet TAKE 1 TABLET TWICE A DAY 180  tablet 0  . fenofibrate 160 MG tablet TAKE 1 TABLET(160 MG) BY MOUTH DAILY 90 tablet 1  . fluconazole (DIFLUCAN) 150 MG tablet 1 tab po today and another tablet in 3 days 2 tablet 0  . fluticasone (FLONASE) 50 MCG/ACT nasal spray Place 2 sprays into both nostrils daily. (Patient taking differently: Place 2 sprays into both nostrils daily as needed for allergies or rhinitis. ) 16 g 1  . folic acid (FOLVITE) 1 MG tablet Take 1 mg by mouth daily.      Marland Kitchen glucose blood test strip OneTouch Verio strips  U ONCE D UTD    . HYDROcodone-acetaminophen (NORCO) 10-325 MG tablet Take 1 tablet by mouth every 4 (four) hours as needed for moderate pain or severe pain (pain). 30 tablet 0  . hydrocortisone (ANUSOL-HC) 2.5 % rectal cream Place rectally 3 (three) times daily as needed for hemorrhoids or anal itching. 30 g 3  . hydrOXYzine (ATARAX/VISTARIL) 10 MG tablet 1 tab po q hs  as needed for itching 5 tablet 0  . levothyroxine (SYNTHROID, LEVOTHROID) 125 MCG tablet TAKE 1 TABLET DAILY 90 tablet 0  . Liniments (BLUE-EMU SUPER STRENGTH EX) Apply 1 application topically daily as needed (pain).    . methocarbamol (ROBAXIN) 500 MG tablet Take 1 tablet (500 mg total) by mouth every 6 (six) hours as needed for muscle spasms. 40 tablet 3  . methotrexate (RHEUMATREX) 2.5 MG tablet Take 20 mg by mouth every Monday.     . multivitamin (THERAGRAN) per tablet Take 1 tablet by mouth daily.      . ondansetron (ZOFRAN) 4 MG tablet Take 1 tablet (4 mg total) by mouth every 8 (eight) hours as needed for nausea or vomiting. 20 tablet 0  . ondansetron (ZOFRAN) 4 MG tablet TAKE 1 TABLET EVERY 6 HOURS AS NEEDED FOR NAUSEA OR VOMITING 40 tablet 0  . pantoprazole (PROTONIX) 40 MG tablet TAKE 1 TABLET DAILY 90 tablet 1  . pioglitazone (ACTOS) 15 MG tablet TAKE 1 TABLET DAILY 90 tablet 3  . polyethylene glycol (MIRALAX) packet Take 17 g by mouth daily as needed for moderate constipation. OTC 30 each 3  . predniSONE (DELTASONE) 10 MG  tablet 4 tab po day 1, 3 tab po day 2, 2 tab po day 3, 1 tab po day4 10 tablet 0  . vitamin B-12 (CYANOCOBALAMIN) 1000 MCG tablet Take 1,000 mcg by mouth daily.     No current facility-administered medications on file prior to visit.     There were no vitals taken for this visit.      Objective:   Physical Exam  .Mental Status - Alert. General Appearance - Well groomed. Not in acute distress.  Skin Rashes- No Rashes.  HEENT Head- Normal. Ear Auditory Canal - Left- Normal. Right - Normal.Tympanic Membrane- Left- Normal. Right- Normal. Eye Sclera/Conjunctiva- Left- Normal. Right- Normal. Nose & Sinuses Nasal Mucosa- Left-  Boggy and Congested. Right-  Boggy and  Congested.Bilateral maxillary and frontal sinus pressure. Mouth & Throat Lips: Upper Lip- Normal: no dryness, cracking, pallor, cyanosis, or vesicular eruption. Lower Lip-Normal: mild cracking and peeling of his lips. Mild crack in corners/very minimal. Buccal Mucosa- Bilateral- left upper lip region/bucca mucosa slightly red and inflamed.  Lower lip inside has yellowish appearing diffuse appearance of blistering.  Tongue today has deep papillated appearance but also some appearance of geographic tongue.  Oropharynx- No Discharge or Erythema. Tonsils: Characteristics- Bilateral-  No Erythema or Congestion. Size/Enlargement- Bilateral- No enlargement. Discharge- bilateral-None.  Neck Neck- Supple. No Masses.   Chest and Lung Exam Auscultation: Breath Sounds:-Clear even and unlabored.  Cardiovascular Auscultation:Rythm- Regular, rate and rhythm. Murmurs & Other Heart Sounds:Ausculatation of the heart reveal- No Murmurs.  Lymphatic Head & Neck General Head & Neck Lymphatics: Bilateral: Description- No Localized lymphadenopathy.      Assessment & Plan:  For your oral/lesions of your lips, I am going to refill your Magic mouthwash.  Also on inspection you do have a smooth appearance to your tongue and some  irregular contouring.  For this I want to get a B12 level, B1 level and folate level.  I did show Dr.  Etter Sjogren your lip  and discussed recent treatment given.  At this point she does agree that we will refer you to ENT for further evaluation.  I did decide on refilling your Magic mouthwash to give you symptomatic treatment pending the ENT referral.  Follow-up with [place] as needed.  Hopefully your ENT appointment will occur within a week.  I am going to ask staff to call around to find you the quickest/earliesst appointment.

## 2018-02-17 NOTE — Patient Instructions (Addendum)
For your oral/lesions of your lips, I am going to refill your Magic mouthwash.  Also on inspection you do have a smooth appearance to your tongue and some irregular contouring.  For this I want to get a B12 level, B1 level and folate level.  I did show Dr.  Etter Sjogren your lip  and discussed recent treatment given.  At this point she does agree that we will refer you to ENT for further evaluation.  I did decide on refilling your Magic mouthwash to give you symptomatic treatment pending the ENT referral.  Follow-up with [place] as needed.  Hopefully your ENT appointment will occur within a week.  I am going to ask staff to call around to find you the quickest/earliesst appointment.

## 2018-02-18 ENCOUNTER — Telehealth: Payer: Self-pay | Admitting: Family Medicine

## 2018-02-18 DIAGNOSIS — L511 Stevens-Johnson syndrome: Secondary | ICD-10-CM | POA: Diagnosis not present

## 2018-02-18 DIAGNOSIS — H1789 Other corneal scars and opacities: Secondary | ICD-10-CM | POA: Diagnosis not present

## 2018-02-18 DIAGNOSIS — H353132 Nonexudative age-related macular degeneration, bilateral, intermediate dry stage: Secondary | ICD-10-CM | POA: Diagnosis not present

## 2018-02-18 LAB — FOLATE: Folate: >24.1 ng/mL (ref >5.9)

## 2018-02-18 LAB — VITAMIN B12: Vitamin B-12: 492 pg/mL (ref 211–911)

## 2018-02-18 NOTE — Telephone Encounter (Signed)
Ok great.

## 2018-02-18 NOTE — Telephone Encounter (Signed)
Copied from [place] [id number]. Topic: Quick Communication - See Telephone Encounter >> Feb 18, 2018 11:21 AM Rutherford Nail, NT wrote: CRM for notification. See Telephone encounter for: 02/18/18.Marland Kitchen Patient's wife calling and states that she wanted to let Dr Carollee Herter know that they have already made an appointment with a ENT.

## 2018-02-20 DIAGNOSIS — Z96611 Presence of right artificial shoulder joint: Secondary | ICD-10-CM | POA: Diagnosis not present

## 2018-02-20 DIAGNOSIS — Z471 Aftercare following joint replacement surgery: Secondary | ICD-10-CM | POA: Diagnosis not present

## 2018-02-20 DIAGNOSIS — Z4789 Encounter for other orthopedic aftercare: Secondary | ICD-10-CM | POA: Diagnosis not present

## 2018-02-20 LAB — VITAMIN B1: Vitamin B1 (Thiamine): 23 nmol/L (ref 8–30)

## 2018-02-28 MED FILL — MAGIC MW (NYS,BEN,MAAL): 2 days supply | Qty: 240 | Fill #0

## 2018-03-03 ENCOUNTER — Other Ambulatory Visit: Payer: Self-pay | Admitting: Family Medicine

## 2018-03-03 ENCOUNTER — Telehealth: Payer: Self-pay | Admitting: Family Medicine

## 2018-03-03 ENCOUNTER — Other Ambulatory Visit: Payer: Self-pay | Admitting: Medical

## 2018-03-03 DIAGNOSIS — M810 Age-related osteoporosis without current pathological fracture: Secondary | ICD-10-CM | POA: Diagnosis not present

## 2018-03-03 DIAGNOSIS — M5136 Other intervertebral disc degeneration, lumbar region: Secondary | ICD-10-CM | POA: Diagnosis not present

## 2018-03-03 DIAGNOSIS — M0609 Rheumatoid arthritis without rheumatoid factor, multiple sites: Secondary | ICD-10-CM | POA: Diagnosis not present

## 2018-03-03 DIAGNOSIS — M150 Primary generalized (osteo)arthritis: Secondary | ICD-10-CM | POA: Diagnosis not present

## 2018-03-03 DIAGNOSIS — E669 Obesity, unspecified: Secondary | ICD-10-CM | POA: Diagnosis not present

## 2018-03-03 DIAGNOSIS — Z6830 Body mass index (BMI) 30.0-30.9, adult: Secondary | ICD-10-CM | POA: Diagnosis not present

## 2018-03-03 MED ORDER — MAGIC MOUTHWASH: ORAL | 0 refills | Status: DC

## 2018-03-03 MED FILL — MAGIC MW LID/MAAL/DP1:1:1: 2 | 10 days supply | Qty: 200 | Fill #0

## 2018-03-03 NOTE — Telephone Encounter (Signed)
Copied from [place]. Topic: Quick Communication - See Telephone Encounter >> Mar 03, 2018 10:58 AM Conception Chancy, NT wrote: CRM for notification. See Telephone encounter for: 03/03/18.  Patient wife is calling and states Louis [name] prescribed the patient, magic mouthwash SOLN, she states he was referred to a ENT and they prescribed the same thing but it was a different formulation and did not work as well. She states he is needing a refill on the magic mouthwash SOLN that was prescribed from his PCP office.   [employer] [address] [place] [address] Phone: [phone] Fax: [phone]

## 2018-03-04 ENCOUNTER — Other Ambulatory Visit: Payer: Self-pay | Admitting: *Deleted

## 2018-03-04 DIAGNOSIS — Z79891 Long term (current) use of opiate analgesic: Secondary | ICD-10-CM | POA: Diagnosis not present

## 2018-03-04 DIAGNOSIS — M545 Low back pain: Secondary | ICD-10-CM | POA: Diagnosis not present

## 2018-03-04 DIAGNOSIS — E119 Type 2 diabetes mellitus without complications: Secondary | ICD-10-CM

## 2018-03-04 DIAGNOSIS — M5136 Other intervertebral disc degeneration, lumbar region: Secondary | ICD-10-CM | POA: Diagnosis not present

## 2018-03-04 MED ORDER — PIOGLITAZONE HCL 15 MG PO TABS: 15.0000 mg | ORAL_TABLET | Freq: Every day | ORAL | 1 refills | Status: DC

## 2018-03-04 NOTE — Telephone Encounter (Signed)
Patient wife notified and they have picked up medication

## 2018-03-04 NOTE — Telephone Encounter (Signed)
Called in yesterday.  

## 2018-03-11 ENCOUNTER — Other Ambulatory Visit: Payer: Self-pay | Admitting: Family Medicine

## 2018-03-11 DIAGNOSIS — F411 Generalized anxiety disorder: Secondary | ICD-10-CM

## 2018-03-12 NOTE — Telephone Encounter (Signed)
Requesting: XANAX 0.5 MG Contract: 10/11/16 UDS: 05/21/17 Last OV: 02/17/18 Next OV: 07/04/18 Last Refill: 09/25/17   Please advise

## 2018-03-14 ENCOUNTER — Other Ambulatory Visit: Payer: Self-pay | Admitting: Medical

## 2018-03-14 MED FILL — MAGIC MW LID/MAAL/DP1:1:1: 2 | 10 days supply | Qty: 200 | Fill #0

## 2018-03-17 ENCOUNTER — Telehealth: Payer: Self-pay | Admitting: *Deleted

## 2018-03-17 ENCOUNTER — Other Ambulatory Visit: Payer: Self-pay | Admitting: Family Medicine

## 2018-03-17 ENCOUNTER — Telehealth: Payer: Self-pay

## 2018-03-17 DIAGNOSIS — F419 Anxiety disorder, unspecified: Secondary | ICD-10-CM

## 2018-03-17 MED ORDER — LORAZEPAM 0.5 MG PO TABS: 0.5000 mg | ORAL_TABLET | Freq: Every day | ORAL | 1 refills | Status: DC

## 2018-03-17 NOTE — Telephone Encounter (Signed)
Make sure pt knows---  I switched to ativan --- per drug store

## 2018-03-17 NOTE — Telephone Encounter (Signed)
PA initiated via Covermymeds; KEY: [id number]. Received PA approval.   [id number];Review Type:Prior Auth;Coverage Start Date:02/15/2018;Coverage End Date:03/17/2019;

## 2018-03-17 NOTE — Telephone Encounter (Signed)
[facility] [address] sent over a fax stating that ins will not cover alprazolam.  Preferred alternatives are clonazepam, diazepam or lorazepam.

## 2018-03-18 ENCOUNTER — Other Ambulatory Visit: Payer: Self-pay | Admitting: *Deleted

## 2018-03-18 ENCOUNTER — Telehealth: Payer: Self-pay | Admitting: Family Medicine

## 2018-03-18 NOTE — Telephone Encounter (Signed)
Call to pharmacy- they report that the patient did fill the Rx for Xanax 03/12/18. I told them the office had notification that the medication was not covered by insurance and that an alternative had been issued. They want to know if they can cancel the other Rx- told them the office would call them back.

## 2018-03-18 NOTE — Telephone Encounter (Signed)
Called spoke with wife.  Rx for lorazepam d/c at pharmacy and off of profile.  Alprazolam put back on med list.

## 2018-03-18 NOTE — Telephone Encounter (Unsigned)
Copied from [place] [id number]. Topic: Quick Communication - See Telephone Encounter >> Mar 18, 2018  9:14 AM Hewitt Shorts wrote: [facility] [address] [place] [facility] is calling to see if patient is taking both lorazepam and alprazalam-this is not on his medication list the pharmacy just received a new rx florazepam and pat got the alprzalam on 03/12/18 20 day supply and they need confrimation  [facility] [phone]

## 2018-03-18 NOTE — Telephone Encounter (Signed)
Left detailed message on phone that new med had been sent in.

## 2018-03-19 ENCOUNTER — Other Ambulatory Visit: Payer: Self-pay | Admitting: Family Medicine

## 2018-03-19 DIAGNOSIS — F411 Generalized anxiety disorder: Secondary | ICD-10-CM

## 2018-03-20 ENCOUNTER — Other Ambulatory Visit: Payer: Self-pay

## 2018-03-20 ENCOUNTER — Inpatient Hospital Stay (HOSPITAL_BASED_OUTPATIENT_CLINIC_OR_DEPARTMENT_OTHER): Payer: Medicare Other | Admitting: Hematology & Oncology

## 2018-03-20 ENCOUNTER — Ambulatory Visit (HOSPITAL_BASED_OUTPATIENT_CLINIC_OR_DEPARTMENT_OTHER)
Admission: RE | Admit: 2018-03-20 | Discharge: 2018-03-20 | Disposition: A | Discharge status: Home / Self Care | Payer: Medicare Other | Source: Ambulatory Visit | Attending: Hematology & Oncology | Admitting: Hematology & Oncology

## 2018-03-20 ENCOUNTER — Encounter (HOSPITAL_BASED_OUTPATIENT_CLINIC_OR_DEPARTMENT_OTHER): Payer: Self-pay

## 2018-03-20 ENCOUNTER — Inpatient Hospital Stay: Payer: Medicare Other | Attending: Hematology & Oncology

## 2018-03-20 VITALS — BP 139/60 | HR 68 | Temp 98.6°F | Resp 18 | Wt 217.0 lb

## 2018-03-20 DIAGNOSIS — I700 Atherosclerosis of aorta: Secondary | ICD-10-CM | POA: Insufficient documentation

## 2018-03-20 DIAGNOSIS — I82531 Chronic embolism and thrombosis of right popliteal vein: Secondary | ICD-10-CM

## 2018-03-20 DIAGNOSIS — I2699 Other pulmonary embolism without acute cor pulmonale: Secondary | ICD-10-CM

## 2018-03-20 DIAGNOSIS — I82541 Chronic embolism and thrombosis of right tibial vein: Secondary | ICD-10-CM | POA: Diagnosis not present

## 2018-03-20 DIAGNOSIS — Z86718 Personal history of other venous thrombosis and embolism: Secondary | ICD-10-CM | POA: Diagnosis not present

## 2018-03-20 DIAGNOSIS — Z7901 Long term (current) use of anticoagulants: Secondary | ICD-10-CM

## 2018-03-20 DIAGNOSIS — J439 Emphysema, unspecified: Secondary | ICD-10-CM | POA: Insufficient documentation

## 2018-03-20 DIAGNOSIS — I471 Supraventricular tachycardia: Secondary | ICD-10-CM | POA: Insufficient documentation

## 2018-03-20 DIAGNOSIS — I82441 Acute embolism and thrombosis of right tibial vein: Secondary | ICD-10-CM | POA: Diagnosis not present

## 2018-03-20 DIAGNOSIS — Z86711 Personal history of pulmonary embolism: Secondary | ICD-10-CM | POA: Diagnosis not present

## 2018-03-20 DIAGNOSIS — I2601 Septic pulmonary embolism with acute cor pulmonale: Secondary | ICD-10-CM

## 2018-03-20 LAB — CMP (CANCER CENTER ONLY)
ALT: 28 U/L (ref 10–47)
ANION GAP: 9 (ref 5–15)
AST: 27 U/L (ref 11–38)
Albumin: 3.8 g/dL (ref 3.5–5.0)
Alkaline Phosphatase: 80 U/L (ref 26–84)
BUN: 24 mg/dL — ABNORMAL HIGH (ref 7–22)
CHLORIDE: 106 mmol/L (ref 98–108)
CO2: 29 mmol/L (ref 18–33)
Calcium: 9.1 mg/dL (ref 8.0–10.3)
Creatinine: 1.9 mg/dL — ABNORMAL HIGH (ref 0.60–1.20)
Glucose, Bld: 140 mg/dL — ABNORMAL HIGH (ref 73–118)
POTASSIUM: 4.3 mmol/L (ref 3.3–4.7)
Sodium: 144 mmol/L (ref 128–145)
TOTAL PROTEIN: 7.2 g/dL (ref 6.4–8.1)
Total Bilirubin: 0.7 mg/dL (ref 0.2–1.6)

## 2018-03-20 LAB — CBC WITH DIFFERENTIAL (CANCER CENTER ONLY)
Basophils Absolute: 0 10*3/uL (ref 0.0–0.1)
Basophils Relative: 0 %
EOS ABS: 0.2 10*3/uL (ref 0.0–0.5)
EOS PCT: 3 %
HCT: 35.3 % — ABNORMAL LOW (ref 38.7–49.9)
Hemoglobin: 10.9 g/dL — ABNORMAL LOW (ref 13.0–17.1)
Lymphocytes Relative: 26 %
Lymphs Abs: 1.3 10*3/uL (ref 0.9–3.3)
MCH: 28.2 pg (ref 28.0–33.4)
MCHC: 30.9 g/dL — AB (ref 32.0–35.9)
MCV: 91.2 fL (ref 82.0–98.0)
MONO ABS: 0.3 10*3/uL (ref 0.1–0.9)
MONOS PCT: 6 %
Neutro Abs: 3.1 10*3/uL (ref 1.5–6.5)
Neutrophils Relative %: 65 %
PLATELETS: 384 10*3/uL (ref 145–400)
RBC: 3.87 MIL/uL — ABNORMAL LOW (ref 4.20–5.70)
RDW: 18.2 % — AB (ref 11.1–15.7)
WBC Count: 4.8 10*3/uL (ref 4.0–10.0)

## 2018-03-20 MED ORDER — IOPAMIDOL (ISOVUE-370) INJECTION 76%
80.0000 mL | Freq: Once | INTRAVENOUS | Status: AC | PRN
  Administered 2018-03-20: 80 mL via INTRAVENOUS

## 2018-03-20 NOTE — Progress Notes (Signed)
Hematology and Oncology Follow Up Visit  Ronn Smolinsky [id number] 09-Feb-1936 82 y.o. 03/20/2018   Principle Diagnosis:   Recurrent pulmonary embolism - ? (+) lupus anticouagulant  Hemochromatosis - Homozygous for C282Y  Current Therapy:    Eliquis 5 mg po bid - lifelong  Phlebotomy to keep ferritin <100     Interim History:  Mr. Tippin is back for follow-up.  He is doing okay.  He is really had no specific complaints since we last saw him.  He did go ahead and have a CT angiogram done today.  This showed no pulmonary embolism in the chest.  He had a Doppler of the right leg.  This showed a chronic thrombus in the right popliteal vein that was nonocclusive.  There was an unchanged occlusive thrombus in the right posterior tibial veins.  He is doing well on the Eliquis.  He is not wearing a compression stocking on the right leg.  I gave him a prescription to take to have one made.    He had no cough or shortness of breath.  There is no hemoptysis.  He has had no change in bowel or bladder habits.  He was incredibly kind and probably in a [employer] [employer] that I will certainly hang in my [place] [employer] exam room.  Overall, his performance status is ECOG 1.    Medications:  Current Outpatient Medications:  .  ALPRAZolam (XANAX) 0.5 MG tablet, TAKE 1/2 TABLET BY MOUTH EVERY 8 TO 12 HOURS AS NEEDED, Disp: 30 tablet, Rfl: 0 .  amoxicillin (AMOXIL) 500 MG capsule, , Disp: , Rfl:  .  atorvastatin (LIPITOR) 20 MG tablet, TAKE 1 TABLET DAILY AT 6 P.M., Disp: 90 tablet, Rfl: 1 .  CALCIUM-VITAMIN D PO, Take 1 tablet by mouth daily. , Disp: , Rfl:  .  ELIQUIS 5 MG TABS tablet, TAKE 1 TABLET TWICE A DAY, Disp: 180 tablet, Rfl: 0 .  fenofibrate 160 MG tablet, TAKE 1 TABLET(160 MG) BY MOUTH DAILY, Disp: 90 tablet, Rfl: 1 .  fluconazole (DIFLUCAN) 150 MG tablet, 1 tab po today and another tablet in 3 days, Disp: 2 tablet, Rfl: 0 .  fluticasone (FLONASE) 50 MCG/ACT nasal spray,  Place 2 sprays into both nostrils daily. (Patient taking differently: Place 2 sprays into both nostrils daily as needed for allergies or rhinitis. ), Disp: 16 g, Rfl: 1 .  folic acid (FOLVITE) 1 MG tablet, Take 1 mg by mouth daily.  , Disp: , Rfl:  .  glucose blood test strip, OneTouch Verio strips  U ONCE D UTD, Disp: , Rfl:  .  HYDROcodone-acetaminophen (NORCO) 10-325 MG tablet, Take 1 tablet by mouth every 4 (four) hours as needed for moderate pain or severe pain (pain)., Disp: 30 tablet, Rfl: 0 .  hydrOXYzine (ATARAX/VISTARIL) 10 MG tablet, 1 tab po q hs  as needed for itching, Disp: 5 tablet, Rfl: 0 .  levothyroxine (SYNTHROID, LEVOTHROID) 125 MCG tablet, TAKE 1 TABLET DAILY, Disp: 90 tablet, Rfl: 0 .  lidocaine (XYLOCAINE) 2 % solution, SWISH AND SPIT WITH 5 MLS FOUR TIMES DAILY, Disp: 200 mL, Rfl: 0 .  Liniments (BLUE-EMU SUPER STRENGTH EX), Apply 1 application topically daily as needed (pain)., Disp: , Rfl:  .  magic mouthwash SOLN, 5 ml po qid swish and spit, Disp: 200 mL, Rfl: 0 .  methocarbamol (ROBAXIN) 500 MG tablet, Take 1 tablet (500 mg total) by mouth every 6 (six) hours as needed for muscle spasms., Disp: 40 tablet,  Rfl: 3 .  methotrexate (RHEUMATREX) 2.5 MG tablet, Take 20 mg by mouth every Monday. , Disp: , Rfl:  .  multivitamin (THERAGRAN) per tablet, Take 1 tablet by mouth daily.  , Disp: , Rfl:  .  ondansetron (ZOFRAN) 4 MG tablet, Take 1 tablet (4 mg total) by mouth every 8 (eight) hours as needed for nausea or vomiting., Disp: 20 tablet, Rfl: 0 .  ondansetron (ZOFRAN) 4 MG tablet, TAKE 1 TABLET EVERY 6 HOURS AS NEEDED FOR NAUSEA OR VOMITING, Disp: 40 tablet, Rfl: 0 .  pantoprazole (PROTONIX) 40 MG tablet, TAKE 1 TABLET DAILY, Disp: 90 tablet, Rfl: 1 .  pioglitazone (ACTOS) 15 MG tablet, Take 1 tablet (15 mg total) by mouth daily., Disp: 90 tablet, Rfl: 1 .  polyethylene glycol (MIRALAX) packet, Take 17 g by mouth daily as needed for moderate constipation. OTC, Disp: 30 each,  Rfl: 3 .  vitamin B-12 (CYANOCOBALAMIN) 1000 MCG tablet, Take 1,000 mcg by mouth daily., Disp: , Rfl:   Allergies:  Allergies  Allergen Reactions  . Shellfish Allergy Anaphylaxis, Hives and Swelling    ANGIOEDEMA  . Keflex [Cephalexin] Other (See Comments)     Potential steven johnson syndrome  . Garlic Nausea And Vomiting  . Iron Other (See Comments) and Hives    HEMACHROMATOSIS HEMACHROMATOSIS    Past Medical History, Surgical history, Social history, and Family History were reviewed and updated.  Review of Systems: Review of Systems  Constitutional: Negative for appetite change, fatigue, fever and unexpected weight change.  HENT:   Negative for lump/mass, mouth sores, sore throat and trouble swallowing.   Respiratory: Negative for cough, hemoptysis and shortness of breath.   Cardiovascular: Negative for leg swelling and palpitations.  Gastrointestinal: Negative for abdominal distention, abdominal pain, blood in stool, constipation, diarrhea, nausea and vomiting.  Genitourinary: Negative for bladder incontinence, dysuria, frequency and hematuria.   Musculoskeletal: Negative for arthralgias, back pain, gait problem and myalgias.  Skin: Negative for itching and rash.  Neurological: Negative for dizziness, extremity weakness, gait problem, headaches, numbness, seizures and speech difficulty.  Hematological: Does not bruise/bleed easily.  Psychiatric/Behavioral: Negative for depression and sleep disturbance. The patient is not nervous/anxious.     Physical Exam:  weight is 217 lb (98.4 kg). His oral temperature is 98.6 F (37 C). His blood pressure is 139/60 and his pulse is 68. His respiration is 18 and oxygen saturation is 100%.   Wt Readings from Last 3 Encounters:  03/20/18 217 lb (98.4 kg)  02/17/18 220 lb (99.8 kg)  02/11/18 221 lb 9.6 oz (100.5 kg)    Physical Exam  Constitutional: He is oriented to person, place, and time.  HENT:  Head: Normocephalic and  atraumatic.  Mouth/Throat: Oropharynx is clear and moist.  Eyes: Pupils are equal, round, and reactive to light. EOM are normal.  Neck: Normal range of motion.  Cardiovascular: Normal rate, regular rhythm and normal heart sounds.  Pulmonary/Chest: Effort normal and breath sounds normal.  Abdominal: Soft. Bowel sounds are normal.  Musculoskeletal: Normal range of motion. He exhibits no edema, tenderness or deformity.  Lymphadenopathy:    He has no cervical adenopathy.  Neurological: He is alert and oriented to person, place, and time.  Skin: Skin is warm and dry. No rash noted. No erythema.  Psychiatric: He has a normal mood and affect. His behavior is normal. Judgment and thought content normal.  Vitals reviewed.    Lab Results  Component Value Date   WBC 4.8 03/20/2018  HGB 10.9 (L) 03/20/2018   HCT 35.3 (L) 03/20/2018   MCV 91.2 03/20/2018   PLT 384 03/20/2018     Chemistry      Component Value Date/Time   NA 144 03/20/2018 1008   NA 148 (H) 09/16/2017 1051   K 4.3 03/20/2018 1008   K 4.6 09/16/2017 1051   CL 106 03/20/2018 1008   CL 107 09/16/2017 1051   CO2 29 03/20/2018 1008   CO2 29 09/16/2017 1051   BUN 24 (H) 03/20/2018 1008   BUN 20 09/16/2017 1051   CREATININE 1.90 (H) 03/20/2018 1008   CREATININE 1.7 (H) 09/16/2017 1051      Component Value Date/Time   CALCIUM 9.1 03/20/2018 1008   CALCIUM 10.1 09/16/2017 1051   ALKPHOS 80 03/20/2018 1008   ALKPHOS 62 09/16/2017 1051   AST 27 03/20/2018 1008   ALT 28 03/20/2018 1008   ALT 29 09/16/2017 1051   BILITOT 0.7 03/20/2018 1008         Impression and Plan: Mr. Millea is an 82 year old white male.  He has had past thromboembolic events.  He has had past pulmonary emboli.  I will plan to see him back in 3 months.  I do not think we have to do another Doppler except in 6 months.  I will follow-up on a lupus anticoagulant study.  The hemochromatosis should not be much of a problem.  We are following his  iron levels.   Volanda Napoleon, MD 6/20/20191:12 PM

## 2018-03-20 NOTE — Telephone Encounter (Signed)
Requesting:XANAX 0.5 MG Contract: 10/11/16 UDS: Low risk 10/11/16 Last OV: 02/17/18 Next OV: 07/04/18 Last Refill: 09/25/17   Please advise

## 2018-03-21 ENCOUNTER — Telehealth: Payer: Self-pay | Admitting: *Deleted

## 2018-03-21 LAB — FERRITIN: FERRITIN: 23 ng/mL (ref 22–316)

## 2018-03-21 LAB — IRON AND TIBC
Iron: 182 ug/dL — ABNORMAL HIGH (ref 42–163)
SATURATION RATIOS: 56 % (ref 42–163)
TIBC: 324 ug/dL (ref 202–409)
UIBC: 142 ug/dL

## 2018-03-21 NOTE — Telephone Encounter (Addendum)
Patient is aware of results  ----- Message from Volanda Napoleon, MD sent at 03/21/2018 10:33 AM EDT ----- Call - iron level is ok.  The hemochromatosis is nice and stable!!!  Louis [name]

## 2018-03-22 LAB — LUPUS ANTICOAGULANT PANEL
DRVVT: 48.8 s — AB (ref 0.0–47.0)
PTT Lupus Anticoagulant: 34.2 s (ref 0.0–51.9)

## 2018-03-22 LAB — DRVVT MIX: DRVVT MIX: 42.9 s (ref 0.0–47.0)

## 2018-03-24 DIAGNOSIS — M545 Low back pain: Secondary | ICD-10-CM | POA: Diagnosis not present

## 2018-03-27 ENCOUNTER — Other Ambulatory Visit: Payer: Self-pay | Admitting: Medical

## 2018-03-27 MED FILL — MAGIC MW LID/MAAL/DP1:1:1: 2 | 10 days supply | Qty: 200 | Fill #0

## 2018-04-01 ENCOUNTER — Other Ambulatory Visit: Payer: Self-pay | Admitting: Family Medicine

## 2018-04-01 DIAGNOSIS — M545 Low back pain: Secondary | ICD-10-CM | POA: Diagnosis not present

## 2018-04-08 ENCOUNTER — Other Ambulatory Visit: Payer: Self-pay | Admitting: Medical

## 2018-04-08 MED FILL — MAGIC MW LID/MAAL/DP1:1:1: 2 | 10 days supply | Qty: 200 | Fill #0

## 2018-04-09 DIAGNOSIS — G894 Chronic pain syndrome: Secondary | ICD-10-CM | POA: Diagnosis not present

## 2018-04-11 ENCOUNTER — Telehealth: Payer: Self-pay | Admitting: *Deleted

## 2018-04-11 DIAGNOSIS — M545 Low back pain: Secondary | ICD-10-CM | POA: Diagnosis not present

## 2018-04-11 DIAGNOSIS — M4840XD Fatigue fracture of vertebra, site unspecified, subsequent encounter for fracture with routine healing: Secondary | ICD-10-CM | POA: Diagnosis not present

## 2018-04-11 DIAGNOSIS — M5136 Other intervertebral disc degeneration, lumbar region: Secondary | ICD-10-CM | POA: Diagnosis not present

## 2018-04-11 DIAGNOSIS — G894 Chronic pain syndrome: Secondary | ICD-10-CM | POA: Diagnosis not present

## 2018-04-11 DIAGNOSIS — Z79891 Long term (current) use of opiate analgesic: Secondary | ICD-10-CM | POA: Diagnosis not present

## 2018-04-11 NOTE — Telephone Encounter (Signed)
Patient's wife notifying the office that Louis [name] will be undergoing a cementing procedure to his sacrum. She needs to know how to manage the patient's Eliquis.  Spoke with Dr Marin Olp. He wants patient to hold the Eliquis two days before the procedure, and restart the day after.   Wife is aware of instruction. If Dr Nelva Bush' office needs further documentation, instructed her to tell them to reach out to our office directly. She understood.

## 2018-04-21 ENCOUNTER — Other Ambulatory Visit (HOSPITAL_COMMUNITY): Payer: Self-pay | Admitting: Interventional Radiology

## 2018-04-21 DIAGNOSIS — S22080A Wedge compression fracture of T11-T12 vertebra, initial encounter for closed fracture: Secondary | ICD-10-CM

## 2018-04-22 ENCOUNTER — Other Ambulatory Visit: Payer: Self-pay | Admitting: Medical

## 2018-04-22 MED FILL — MAGIC MW LID/MAAL/DP1:1:1: 2 | 10 days supply | Qty: 200 | Fill #0

## 2018-04-23 ENCOUNTER — Ambulatory Visit (HOSPITAL_COMMUNITY)
Admission: RE | Admit: 2018-04-23 | Discharge: 2018-04-23 | Disposition: A | Discharge status: Home / Self Care | Payer: Medicare Other | Source: Ambulatory Visit | Attending: Interventional Radiology | Admitting: Interventional Radiology

## 2018-04-23 DIAGNOSIS — S22080A Wedge compression fracture of T11-T12 vertebra, initial encounter for closed fracture: Secondary | ICD-10-CM

## 2018-04-23 DIAGNOSIS — S3210XA Unspecified fracture of sacrum, initial encounter for closed fracture: Secondary | ICD-10-CM | POA: Diagnosis not present

## 2018-04-23 NOTE — Consult Note (Signed)
Chief Complaint: Patient was seen in consultation today for right sacral insufficiency fracture.  Referring Physician(s): Suella Broad  Supervising Physician: Luanne Bras  Patient Status: [facility] - Out-pt  History of Present Illness: Louis [name]. is a 82 y.o. male with a past medical history of hypertension, hyperlipidemia, pulmonary embolism, reflux esophagitis, GERD, SBO 2004, IBS, hematochromatosis, adenomatous colonic polyps, diverticulosis, kidney stones, CKD, diabetes mellitus, hypothyroidism, testosterone deficiency, anemia, spinal stenosis, and arthritis. He underwent lumbar surgery 07/30/2016 with Dr. Ellene Route. He was bending down to button a shirt and when he stood up, he felt immediate pain. He went to Dr. Nelva Bush, as he has been managing his chronic lower back pain. He underwent a MRI sacrum 04/09/2018 which revealed a right sacral insufficiency fracture.  IR requested by Dr. Nelva Bush for management of right sacral insufficiency fracture. Patient awake and alert sitting in chair. Accompanied by wife. Complains of lower back pain, rated 8/10 at this time. States his pain is 10/10 at its worse, and walking increases pain. Complains of pain down right leg that stops at knee. Denies fever, chills, numbness/tingling down legs, or urinary symptoms including dysuria or frequency.  Patient is currently taking Eliquis 5 mg once daily.   Past Medical History:  Diagnosis Date  . Anemia   . Arthritis    Dr Amil Amen, [facility] rx- Rheumatology  . CKD (chronic kidney disease)   . Diabetes mellitus    takes 1 pill  . Diverticulosis   . GERD (gastroesophageal reflux disease)   . Hemochromatosis     Dr Henrene Pastor  . History of kidney stones 1980   passed spont.  Marland Kitchen HOH (hard of hearing)    congenital hearing loss  . Hx of adenomatous colonic polyps   . Hyperlipidemia   . Hypertension   . Hypothyroidism   . IBS (irritable bowel syndrome)   . Internal hemorrhoids   . Kidney stones,  calcium oxalate    1985 & 2005  . Pulmonary embolism ([place]) 2015   hosp. with Heparin & then on Xarelto- 3 months or so  . Reflux esophagitis   . SBO (small bowel obstruction) ([place]) 2004  . Spinal stenosis    Dr.Ramos  . Testosterone deficiency    111 in 2007    Past Surgical History:  Procedure Laterality Date  . APPENDECTOMY    . cataract  2005   bilaterally  . COLONOSCOPY W/ POLYPECTOMY  2005   tubular adenoma  . JOINT REPLACEMENT     L- shoulder, x2, replacement on both knees   . L4-5 & L5- S1 bilat facet injections  01/20/14    Dr Nelva Bush  . LUMBAR LAMINECTOMY WITH COFLEX 2 LEVEL N/A 07/30/2016   Procedure: Lumbar three- four Lumbar four - five Laminectomy and foraminotomy;  Surgeon: Kristeen Miss, MD;  Location: [place];  Service: Neurosurgery;  Laterality: N/A;  L3-4 L4-5 Laminectomy with coflex  . lumbar nerve block      X 14; Dr Nelva Bush  . MIDDLE EAR SURGERY  1980   Teflon tack   . REFRACTIVE SURGERY  2005   SE Ophth  . REVERSE SHOULDER ARTHROPLASTY Right 11/15/2017   Procedure: RIGHT REVERSE SHOULDER ARTHROPLASTY;  Surgeon: Netta Cedars, MD;  Location: [facility];  Service: Orthopedics;  Laterality: Right;  . ROTATOR CUFF REPAIR     3 on right shoulder  . TM replacement  1981  . TONSILLECTOMY AND ADENOIDECTOMY    . TOTAL KNEE ARTHROPLASTY   201 & 2012  bilaterally;Dr Alusio  . TOTAL SHOULDER REPLACEMENT     L    Allergies: Shellfish allergy; Keflex [cephalexin]; Garlic; and Iron  Medications: Prior to Admission medications   Medication Sig Start Date End Date Taking? Authorizing Provider  ALPRAZolam Duanne Moron) 0.5 MG tablet TAKE 1/2 TABLET BY MOUTH EVERY 8 TO 12 HOURS AS NEEDED 03/20/18   Carollee Herter, Alferd Apa, DO  amoxicillin (AMOXIL) 500 MG capsule  12/17/17   [provider]  atorvastatin (LIPITOR) 20 MG tablet TAKE 1 TABLET DAILY AT 6 P.M. 12/12/17   Ann Held, DO  CALCIUM-VITAMIN D PO Take 1 tablet by mouth daily.     [provider]    ELIQUIS 5 MG TABS tablet TAKE 1 TABLET TWICE A DAY 04/01/18   Carollee Herter, Yvonne R, DO  fenofibrate 160 MG tablet TAKE 1 TABLET(160 MG) BY MOUTH DAILY 12/09/17   Carollee Herter, Alferd Apa, DO  fluconazole (DIFLUCAN) 150 MG tablet 1 tab po today and another tablet in 3 days 02/11/18   Saguier, Percell Miller, PA-C  fluticasone [facility]) 50 MCG/ACT nasal spray Place 2 sprays into both nostrils daily. Patient taking differently: Place 2 sprays into both nostrils daily as needed for allergies or rhinitis.  01/14/17   Saguier, Percell Miller, PA-C  folic acid (FOLVITE) 1 MG tablet Take 1 mg by mouth daily.      [provider]  glucose blood test strip OneTouch Verio strips  U ONCE D UTD    [provider]  HYDROcodone-acetaminophen (NORCO) 10-325 MG tablet Take 1 tablet by mouth every 4 (four) hours as needed for moderate pain or severe pain (pain). 11/15/17   Netta Cedars, MD  hydrOXYzine (ATARAX/VISTARIL) 10 MG tablet 1 tab po q hs  as needed for itching 01/29/18   Saguier, Percell Miller, PA-C  levothyroxine (SYNTHROID, LEVOTHROID) 125 MCG tablet TAKE 1 TABLET DAILY 02/10/18   Carollee Herter, Yvonne R, DO  lidocaine (XYLOCAINE) 2 % solution SWISH AND SPIT 5MLS BY MOUTH 4 TIMES DAILY. DO NOT SWALLOW 04/22/18   Saguier, Percell Miller, PA-C  Liniments (BLUE-EMU SUPER STRENGTH EX) Apply 1 application topically daily as needed (pain).    [provider]  magic mouthwash SOLN 5 ml po qid swish and spit 03/03/18   Carollee Herter, Alferd Apa, DO  methocarbamol (ROBAXIN) 500 MG tablet Take 1 tablet (500 mg total) by mouth every 6 (six) hours as needed for muscle spasms. 11/15/17   Netta Cedars, MD  methotrexate (RHEUMATREX) 2.5 MG tablet Take 20 mg by mouth every Monday.     [provider]  multivitamin [facility] per tablet Take 1 tablet by mouth daily.      [provider]  ondansetron (ZOFRAN) 4 MG tablet Take 1 tablet (4 mg total) by mouth every 8 (eight) hours as needed for nausea or vomiting. 10/16/17   Rai,  Ripudeep Raliegh Ip, MD  ondansetron (ZOFRAN) 4 MG tablet TAKE 1 TABLET EVERY 6 HOURS AS NEEDED FOR NAUSEA OR VOMITING 12/23/17   Irene Shipper, MD  pantoprazole (PROTONIX) 40 MG tablet TAKE 1 TABLET DAILY 02/04/18   Carollee Herter, Alferd Apa, DO  pioglitazone (ACTOS) 15 MG tablet Take 1 tablet (15 mg total) by mouth daily. 03/04/18   Ann Held, DO  polyethylene glycol [facility]) packet Take 17 g by mouth daily as needed for moderate constipation. OTC 10/16/17   Rai, Vernelle Emerald, MD  vitamin B-12 (CYANOCOBALAMIN) 1000 MCG tablet Take 1,000 mcg by mouth daily.    [provider]     Family History  Problem Relation Age of Onset  . Diabetes Maternal Aunt   . Depression Brother        suicide  . Diabetes Father   . Stroke Father 41       MI in hospital for CVA  . Heart failure Mother        valvular disease  . Heart disease Paternal Aunt         2 aunts; 1 had MI @ ?> 9  . Heart disease Paternal Uncle           . Breast cancer Maternal Grandmother   . Colon cancer Neg Hx   . COPD Neg Hx   . Asthma Neg Hx     Social History   Socioeconomic History  . Marital status: Married    Spouse name: Not on file  . Number of children: Not on file  . Years of education: Not on file  . Highest education level: Not on file  Occupational History  . Not on file  Social Needs  . Financial resource strain: Not on file  . Food insecurity:    Worry: Not on file    Inability: Not on file  . Transportation needs:    Medical: Not on file    Non-medical: Not on file  Tobacco Use  . Smoking status: Former Smoker    Last attempt to quit: 10/01/1980    Years since quitting: 37.5  . Smokeless tobacco: Never Used  Substance and Sexual Activity  . Alcohol use: Yes    Alcohol/week: 0.0 oz    Comment: rare   . Drug use: No  . Sexual activity: Not Currently  Lifestyle  . Physical activity:    Days per week: Not on file    Minutes per session: Not on file  . Stress: Not on file  Relationships    . Social connections:    Talks on phone: Not on file    Gets together: Not on file    Attends religious service: Not on file    Active member of club or organization: Not on file    Attends meetings of clubs or organizations: Not on file    Relationship status: Not on file  Other Topics Concern  . Not on file  Social History Narrative   Daily caffeine      Review of Systems: A 12 point ROS discussed and pertinent positives are indicated in the HPI above.  All other systems are negative.  Review of Systems  Constitutional: Negative for chills and fever.  Respiratory: Negative for shortness of breath and wheezing.   Cardiovascular: Negative for chest pain and palpitations.  Gastrointestinal:       Negative for bowel incontinence.  Genitourinary:       Negative for bladder incontinence.  Musculoskeletal: Positive for back pain.  Neurological: Negative for numbness.  Psychiatric/Behavioral: Negative for behavioral problems and confusion.    Vital Signs: There were no vitals taken for this visit.  Physical Exam  Constitutional: He is oriented to person, place, and time. He appears well-developed and well-nourished. No distress.  Pulmonary/Chest: Effort normal. No respiratory distress.  Musculoskeletal:  Moderate tenderness of lower back at approximate level of sacrum.  Neurological: He is alert and oriented to person, place, and time.  Skin: Skin is warm and dry.  Psychiatric: He has a normal mood and affect. His behavior is normal. Judgment and thought content normal.  Nursing note and  vitals reviewed.    Imaging: No results found.  Labs:  CBC: Recent Labs    10/31/17 1227 11/08/17 1358 11/16/17 0549 12/18/17 1127 03/20/18 1008  WBC 4.3 4.8  --  5.7 4.8  HGB 11.8* 11.6* 9.5* 10.7* 10.9*  HCT 36.2* 36.4* 30.4* 33.9* 35.3*  PLT 354.0 298  --  324 384    COAGS: Recent Labs    06/18/17 1751 10/14/17 1502 10/15/17 0749 10/16/17 0411  INR 1.02  --   --    --   APTT  --  81* 92* 98*    BMP: Recent Labs    10/16/17 0411  11/08/17 1358 11/16/17 0549 12/18/17 1127 03/20/18 1008  NA 139   < > 142 139 140 144  K 3.7   < > 4.6 3.9 4.2 4.3  CL 110   < > 107 104 106 106  CO2 20*   < > 22 22 22 29   GLUCOSE 107*   < > 114* 156* 124 140*  BUN 8   < > 23* 17 21 24*  CALCIUM 9.3   < > 9.2 8.4* 9.5 9.1  CREATININE 1.51*   < > 1.66* 1.62* 1.81* 1.90*  GFRNONAA 42*  --  37* 38* 34*  --   GFRAA 49*  --  43* 45* 39*  --    < > = values in this interval not displayed.    LIVER FUNCTION TESTS: Recent Labs    10/14/17 0623 10/31/17 1227 12/18/17 1127 03/20/18 1008  BILITOT 0.9 0.5 0.4 0.7  AST 18 19 23 27   ALT 15* 16 14 28   ALKPHOS 40 51 79 80  PROT 5.8* 7.1 7.0 7.2  ALBUMIN 3.5 4.3 3.9 3.8    TUMOR MARKERS: No results for input(s): AFPTM, CEA, CA199, CHROMGRNA in the last 8760 hours.  Assessment and Plan:  Right sacral insufficiency fracture. Reviewed imaging with patient and wife. Explained that the best course of management for his sacral fracture at this time is with a procedure called a sacroplasty. Explained procedure including risks and benefits. Explained the goal of this procedure is to decrease his pain level.  Instructed patient that in order for this procedure to occur, she will have to be off Eliquis a minimum of 48 hours prior to procedure, per IR protocol. Instructed patient to begin taking Aspirin 325 mg once daily while holding Eliquis.  Plan for follow-up with an image-guided sacroplasty. Informed patient that our schedulers will call him to set up this appointment.  All questions answered and concerns addressed. Patient and wife convey understanding and agree with plan.  Thank you for this interesting consult.  I greatly enjoyed meeting Mickal Meno. and look forward to participating in their care.  A copy of this report was sent to the requesting provider on this date.  Electronically Signed: Earley Abide, PA-C 04/23/2018, 8:23 AM   I spent a total of 30 Minutes in face to face in clinical consultation, greater than 50% of which was counseling/coordinating care for right sacral insufficiency fracture.

## 2018-04-29 ENCOUNTER — Other Ambulatory Visit (HOSPITAL_COMMUNITY): Payer: Self-pay | Admitting: Interventional Radiology

## 2018-04-29 DIAGNOSIS — S3210XA Unspecified fracture of sacrum, initial encounter for closed fracture: Secondary | ICD-10-CM

## 2018-05-01 ENCOUNTER — Other Ambulatory Visit: Payer: Self-pay

## 2018-05-06 ENCOUNTER — Other Ambulatory Visit: Payer: Self-pay | Admitting: Radiology

## 2018-05-07 ENCOUNTER — Other Ambulatory Visit: Payer: Self-pay | Admitting: Radiology

## 2018-05-08 ENCOUNTER — Encounter (HOSPITAL_COMMUNITY): Payer: Self-pay

## 2018-05-08 ENCOUNTER — Ambulatory Visit (HOSPITAL_COMMUNITY)
Admission: RE | Admit: 2018-05-08 | Discharge: 2018-05-08 | Disposition: A | Discharge status: Home / Self Care | Payer: Medicare Other | Source: Ambulatory Visit | Attending: Interventional Radiology | Admitting: Interventional Radiology

## 2018-05-08 ENCOUNTER — Other Ambulatory Visit (HOSPITAL_COMMUNITY): Payer: Self-pay | Admitting: Interventional Radiology

## 2018-05-08 ENCOUNTER — Other Ambulatory Visit: Payer: Self-pay

## 2018-05-08 DIAGNOSIS — N189 Chronic kidney disease, unspecified: Secondary | ICD-10-CM | POA: Insufficient documentation

## 2018-05-08 DIAGNOSIS — E039 Hypothyroidism, unspecified: Secondary | ICD-10-CM | POA: Diagnosis not present

## 2018-05-08 DIAGNOSIS — S3210XA Unspecified fracture of sacrum, initial encounter for closed fracture: Secondary | ICD-10-CM

## 2018-05-08 DIAGNOSIS — K210 Gastro-esophageal reflux disease with esophagitis: Secondary | ICD-10-CM | POA: Diagnosis not present

## 2018-05-08 DIAGNOSIS — I129 Hypertensive chronic kidney disease with stage 1 through stage 4 chronic kidney disease, or unspecified chronic kidney disease: Secondary | ICD-10-CM | POA: Diagnosis not present

## 2018-05-08 DIAGNOSIS — E785 Hyperlipidemia, unspecified: Secondary | ICD-10-CM | POA: Diagnosis not present

## 2018-05-08 DIAGNOSIS — E291 Testicular hypofunction: Secondary | ICD-10-CM | POA: Diagnosis not present

## 2018-05-08 DIAGNOSIS — M8448XA Pathological fracture, other site, initial encounter for fracture: Secondary | ICD-10-CM | POA: Insufficient documentation

## 2018-05-08 DIAGNOSIS — Z86711 Personal history of pulmonary embolism: Secondary | ICD-10-CM | POA: Diagnosis not present

## 2018-05-08 DIAGNOSIS — M4858XA Collapsed vertebra, not elsewhere classified, sacral and sacrococcygeal region, initial encounter for fracture: Secondary | ICD-10-CM | POA: Diagnosis not present

## 2018-05-08 DIAGNOSIS — Z87891 Personal history of nicotine dependence: Secondary | ICD-10-CM | POA: Diagnosis not present

## 2018-05-08 DIAGNOSIS — Z7901 Long term (current) use of anticoagulants: Secondary | ICD-10-CM | POA: Diagnosis not present

## 2018-05-08 DIAGNOSIS — Z7951 Long term (current) use of inhaled steroids: Secondary | ICD-10-CM | POA: Diagnosis not present

## 2018-05-08 DIAGNOSIS — E1122 Type 2 diabetes mellitus with diabetic chronic kidney disease: Secondary | ICD-10-CM | POA: Diagnosis not present

## 2018-05-08 DIAGNOSIS — M1990 Unspecified osteoarthritis, unspecified site: Secondary | ICD-10-CM | POA: Diagnosis not present

## 2018-05-08 DIAGNOSIS — H905 Unspecified sensorineural hearing loss: Secondary | ICD-10-CM | POA: Insufficient documentation

## 2018-05-08 DIAGNOSIS — R52 Pain, unspecified: Secondary | ICD-10-CM | POA: Diagnosis not present

## 2018-05-08 DIAGNOSIS — K589 Irritable bowel syndrome without diarrhea: Secondary | ICD-10-CM | POA: Diagnosis not present

## 2018-05-08 HISTORY — PX: IR SACROPLASTY BILATERAL: IMG5561

## 2018-05-08 LAB — BASIC METABOLIC PANEL
ANION GAP: 10 (ref 5–15)
BUN: 12 mg/dL (ref 8–23)
CO2: 25 mmol/L (ref 22–32)
Calcium: 9 mg/dL (ref 8.9–10.3)
Chloride: 106 mmol/L (ref 98–111)
Creatinine, Ser: 1.69 mg/dL — ABNORMAL HIGH (ref 0.61–1.24)
GFR calc Af Amer: 42 mL/min — ABNORMAL LOW (ref >60)
GFR, EST NON AFRICAN AMERICAN: 37 mL/min — AB (ref >60)
GLUCOSE: 118 mg/dL — AB (ref 70–99)
POTASSIUM: 4.4 mmol/L (ref 3.5–5.1)
Sodium: 141 mmol/L (ref 135–145)

## 2018-05-08 LAB — CBC
HEMATOCRIT: 34.1 % — AB (ref 39.0–52.0)
HEMOGLOBIN: 10.3 g/dL — AB (ref 13.0–17.0)
MCH: 28.2 pg (ref 26.0–34.0)
MCHC: 30.2 g/dL (ref 30.0–36.0)
MCV: 93.4 fL (ref 78.0–100.0)
Platelets: 390 10*3/uL (ref 150–400)
RBC: 3.65 MIL/uL — ABNORMAL LOW (ref 4.22–5.81)
RDW: 18.5 % — AB (ref 11.5–15.5)
WBC: 5.7 10*3/uL (ref 4.0–10.5)

## 2018-05-08 LAB — PROTIME-INR
INR: 1.08
PROTHROMBIN TIME: 13.9 s (ref 11.4–15.2)

## 2018-05-08 LAB — GLUCOSE, CAPILLARY: Glucose-Capillary: 120 mg/dL — ABNORMAL HIGH (ref 70–99)

## 2018-05-08 MED ORDER — MIDAZOLAM HCL 2 MG/2ML IJ SOLN
INTRAMUSCULAR | Status: AC
  Filled 2018-05-08: qty 2

## 2018-05-08 MED ORDER — DIPHENHYDRAMINE HCL 50 MG/ML IJ SOLN: 50.0000 mg | Freq: Once | INTRAMUSCULAR | Status: DC

## 2018-05-08 MED ORDER — HYDROMORPHONE HCL 1 MG/ML IJ SOLN
INTRAMUSCULAR | Status: DC
Start: 2018-05-08 — End: 2018-05-09
  Filled 2018-05-08: qty 1

## 2018-05-08 MED ORDER — MIDAZOLAM HCL 2 MG/2ML IJ SOLN
INTRAMUSCULAR | Status: AC | PRN
  Administered 2018-05-08 (×2): 1 mg via INTRAVENOUS

## 2018-05-08 MED ORDER — FENTANYL CITRATE (PF) 100 MCG/2ML IJ SOLN
INTRAMUSCULAR | Status: AC
  Filled 2018-05-08: qty 2

## 2018-05-08 MED ORDER — VANCOMYCIN HCL 10 G IV SOLR
1500.0000 mg | INTRAVENOUS | Status: AC
  Administered 2018-05-08: 1500 mg via INTRAVENOUS
  Filled 2018-05-08: qty 1500

## 2018-05-08 MED ORDER — SODIUM CHLORIDE 0.9 % IV SOLN
INTRAVENOUS | Status: DC
Start: 2018-05-08 — End: 2018-05-09

## 2018-05-08 MED ORDER — TOBRAMYCIN SULFATE 1.2 G IJ SOLR
INTRAMUSCULAR | Status: DC
Start: 2018-05-08 — End: 2018-05-09
  Filled 2018-05-08: qty 1.2

## 2018-05-08 MED ORDER — FENTANYL CITRATE (PF) 100 MCG/2ML IJ SOLN
INTRAMUSCULAR | Status: AC | PRN
  Administered 2018-05-08 (×4): 25 ug via INTRAVENOUS

## 2018-05-08 MED ORDER — DIPHENHYDRAMINE HCL 50 MG/ML IJ SOLN
INTRAMUSCULAR | Status: AC
  Filled 2018-05-08: qty 1

## 2018-05-08 MED ORDER — HYDROMORPHONE HCL 1 MG/ML IJ SOLN
INTRAMUSCULAR | Status: AC
  Filled 2018-05-08: qty 1

## 2018-05-08 MED ORDER — BUPIVACAINE HCL (PF) 0.5 % IJ SOLN
INTRAMUSCULAR | Status: AC
  Filled 2018-05-08: qty 30

## 2018-05-08 MED ORDER — IOPAMIDOL (ISOVUE-300) INJECTION 61%
INTRAVENOUS | Status: AC
  Administered 2018-05-08: 8 mL
  Filled 2018-05-08: qty 50

## 2018-05-08 MED ORDER — TOBRAMYCIN SULFATE 1.2 G IJ SOLR
INTRAMUSCULAR | Status: AC
  Filled 2018-05-08: qty 1.2

## 2018-05-08 MED ORDER — HYDROMORPHONE HCL 1 MG/ML IJ SOLN
INTRAMUSCULAR | Status: AC | PRN
  Administered 2018-05-08 (×2): 1 mg via INTRAVENOUS

## 2018-05-08 MED ORDER — VANCOMYCIN HCL IN DEXTROSE 1-5 GM/200ML-% IV SOLN: 1000.0000 mg | INTRAVENOUS | Status: DC

## 2018-05-08 MED ORDER — SODIUM CHLORIDE 0.9 % IV SOLN: INTRAVENOUS | Status: AC

## 2018-05-08 MED ORDER — DIPHENHYDRAMINE HCL 50 MG/ML IJ SOLN
INTRAMUSCULAR | Status: AC | PRN
  Administered 2018-05-08: 50 mg via INTRAVENOUS

## 2018-05-08 MED ORDER — GELATIN ABSORBABLE 12-7 MM EX MISC
CUTANEOUS | Status: DC
Start: 2018-05-08 — End: 2018-05-09
  Filled 2018-05-08: qty 1

## 2018-05-08 MED ORDER — IOPAMIDOL (ISOVUE-300) INJECTION 61%
INTRAVENOUS | Status: AC
  Filled 2018-05-08: qty 50

## 2018-05-08 MED ORDER — HYDROCODONE-ACETAMINOPHEN 10-325 MG PO TABS
1.0000 | ORAL_TABLET | Freq: Once | ORAL | Status: DC
  Filled 2018-05-08: qty 1

## 2018-05-08 NOTE — Progress Notes (Signed)
IR here to take pt down Vicodin not given.

## 2018-05-08 NOTE — H&P (Addendum)
Chief Complaint: Patient was seen in consultation today for sacroplasty at the request of Dr Mickel Duhamel    Supervising Physician: Luanne Bras  Patient Status: [facility] - Out-pt  History of Present Illness: Louis Eisen. is a 82 y.o. male   HTN; HLD; PE (on Eliquis-- LD 8/5) Hemachromatosis; CKD; DM Hx Lumbar surgery 07/30/16   Recently he was bending to button shirt and felt sudden low back pain MRI revealed sacral insufficiency Very painful low back and seat  Consulted with Dr Estanislado Pandy 04/23/18 per Dr Nelva Bush Now scheduled for Right and possible left sacroplasty    Past Medical History:  Diagnosis Date  . Anemia   . Arthritis    Dr Amil Amen, [place] rx- Rheumatology  . CKD (chronic kidney disease)   . Diabetes mellitus    takes 1 pill  . Diverticulosis   . GERD (gastroesophageal reflux disease)   . Hemochromatosis     Dr Henrene Pastor  . History of kidney stones 1980   passed spont.  Marland Kitchen HOH (hard of hearing)    congenital hearing loss  . Hx of adenomatous colonic polyps   . Hyperlipidemia   . Hypertension   . Hypothyroidism   . IBS (irritable bowel syndrome)   . Internal hemorrhoids   . Kidney stones, calcium oxalate    1985 & 2005  . Pulmonary embolism ([place]) 2015   hosp. with Heparin & then on Xarelto- 3 months or so  . Reflux esophagitis   . SBO (small bowel obstruction) ([place]) 2004  . Spinal stenosis    Dr.Ramos  . Testosterone deficiency    111 in 2007    Past Surgical History:  Procedure Laterality Date  . APPENDECTOMY    . cataract  2005   bilaterally  . COLONOSCOPY W/ POLYPECTOMY  2005   tubular adenoma  . JOINT REPLACEMENT     L- shoulder, x2, replacement on both knees   . L4-5 & L5- S1 bilat facet injections  01/20/14    Dr Nelva Bush  . LUMBAR LAMINECTOMY WITH COFLEX 2 LEVEL N/A 07/30/2016   Procedure: Lumbar three- four Lumbar four - five Laminectomy and foraminotomy;  Surgeon: Kristeen Miss, MD;  Location: [place];  Service: Neurosurgery;   Laterality: N/A;  L3-4 L4-5 Laminectomy with coflex  . lumbar nerve block      X 14; Dr Nelva Bush  . MIDDLE EAR SURGERY  1980   Teflon tack   . REFRACTIVE SURGERY  2005   SE Ophth  . REVERSE SHOULDER ARTHROPLASTY Right 11/15/2017   Procedure: RIGHT REVERSE SHOULDER ARTHROPLASTY;  Surgeon: Netta Cedars, MD;  Location: [place];  Service: Orthopedics;  Laterality: Right;  . ROTATOR CUFF REPAIR     3 on right shoulder  . TM replacement  1981  . TONSILLECTOMY AND ADENOIDECTOMY    . TOTAL KNEE ARTHROPLASTY   201 & 2012    bilaterally;Dr Alusio  . TOTAL SHOULDER REPLACEMENT     L    Allergies: Shellfish allergy; Keflex [cephalexin]; Garlic; and Iron  Medications: Prior to Admission medications   Medication Sig Start Date End Date Taking? Authorizing Provider  ALPRAZolam Duanne Moron) 0.5 MG tablet TAKE 1/2 TABLET BY MOUTH EVERY 8 TO 12 HOURS AS NEEDED 03/20/18  Yes Carollee Herter, Yvonne R, DO  atorvastatin (LIPITOR) 20 MG tablet TAKE 1 TABLET DAILY AT 6 P.M. 12/12/17  Yes Roma Schanz R, DO  CALCIUM-VITAMIN D PO Take 1 tablet by mouth daily.    Yes [provider]  ELIQUIS 5 MG TABS tablet TAKE 1 TABLET TWICE A DAY 04/01/18  Yes Carollee Herter, Yvonne R, DO  fenofibrate 160 MG tablet TAKE 1 TABLET(160 MG) BY MOUTH DAILY 12/09/17  Yes Ann Held, DO  folic acid (FOLVITE) 1 MG tablet Take 1 mg by mouth daily.     Yes [provider]  HYDROcodone-acetaminophen (NORCO) 10-325 MG tablet Take 1 tablet by mouth every 4 (four) hours as needed for moderate pain or severe pain (pain). 11/15/17  Yes Netta Cedars, MD  levothyroxine (SYNTHROID, LEVOTHROID) 125 MCG tablet TAKE 1 TABLET DAILY 02/10/18  Yes Carollee Herter, Yvonne R, DO  Liniments (BLUE-EMU SUPER STRENGTH EX) Apply 1 application topically daily as needed (pain).   Yes [provider]  magic mouthwash SOLN 5 ml po qid swish and spit Patient taking differently: Take 5 mLs by mouth 3 (three) times daily as needed for mouth  pain. 5 ml po qid swish and spit 03/03/18  Yes Lowne Lyndal Pulley R, DO  methocarbamol (ROBAXIN) 500 MG tablet Take 1 tablet (500 mg total) by mouth every 6 (six) hours as needed for muscle spasms. 11/15/17  Yes Netta Cedars, MD  methotrexate (RHEUMATREX) 2.5 MG tablet Take 20 mg by mouth every Monday.    Yes [provider]  multivitamin [facility] per tablet Take 1 tablet by mouth daily.     Yes [provider]  ondansetron (ZOFRAN) 4 MG tablet Take 1 tablet (4 mg total) by mouth every 8 (eight) hours as needed for nausea or vomiting. 10/16/17  Yes Rai, Ripudeep K, MD  ondansetron (ZOFRAN) 4 MG tablet TAKE 1 TABLET EVERY 6 HOURS AS NEEDED FOR NAUSEA OR VOMITING 12/23/17  Yes Irene Shipper, MD  pantoprazole (PROTONIX) 40 MG tablet TAKE 1 TABLET DAILY 02/04/18  Yes Roma Schanz R, DO  pioglitazone (ACTOS) 15 MG tablet Take 1 tablet (15 mg total) by mouth daily. 03/04/18  Yes Roma Schanz R, DO  polyethylene glycol [facility]) packet Take 17 g by mouth daily as needed for moderate constipation. OTC 10/16/17  Yes Rai, Ripudeep K, MD  vitamin B-12 (CYANOCOBALAMIN) 1000 MCG tablet Take 1,000 mcg by mouth daily.   Yes [provider]  amoxicillin (AMOXIL) 500 MG capsule Take 2,000 mg by mouth See admin instructions. For Dental Procedures 12/17/17   [provider]  fluticasone (FLONASE) 50 MCG/ACT nasal spray Place 2 sprays into both nostrils daily. Patient taking differently: Place 2 sprays into both nostrils daily as needed for allergies or rhinitis.  01/14/17   Saguier, Percell Miller, PA-C  glucose blood test strip OneTouch Verio strips  U ONCE D UTD    [provider]  hydrOXYzine (ATARAX/VISTARIL) 10 MG tablet 1 tab po q hs  as needed for itching Patient taking differently: Take 10 mg by mouth at bedtime as needed for itching. 1 tab po q hs  as needed for itching 01/29/18   Saguier, Percell Miller, PA-C     Family History  Problem Relation Age of Onset  . Diabetes  Maternal Aunt   . Depression Brother        suicide  . Diabetes Father   . Stroke Father 40       MI in hospital for CVA  . Heart failure Mother        valvular disease  . Heart disease Paternal Aunt         2 aunts; 1 had MI @ ?> 93  . Heart disease Paternal Uncle           .  Breast cancer Maternal Grandmother   . Colon cancer Neg Hx   . COPD Neg Hx   . Asthma Neg Hx     Social History   Socioeconomic History  . Marital status: Married    Spouse name: Not on file  . Number of children: Not on file  . Years of education: Not on file  . Highest education level: Not on file  Occupational History  . Not on file  Social Needs  . Financial resource strain: Not on file  . Food insecurity:    Worry: Not on file    Inability: Not on file  . Transportation needs:    Medical: Not on file    Non-medical: Not on file  Tobacco Use  . Smoking status: Former Smoker    Last attempt to quit: 10/01/1980    Years since quitting: 37.6  . Smokeless tobacco: Never Used  Substance and Sexual Activity  . Alcohol use: Yes    Alcohol/week: 0.0 standard drinks    Comment: rare   . Drug use: No  . Sexual activity: Not Currently  Lifestyle  . Physical activity:    Days per week: Not on file    Minutes per session: Not on file  . Stress: Not on file  Relationships  . Social connections:    Talks on phone: Not on file    Gets together: Not on file    Attends religious service: Not on file    Active member of club or organization: Not on file    Attends meetings of clubs or organizations: Not on file    Relationship status: Not on file  Other Topics Concern  . Not on file  Social History Narrative   Daily caffeine     Review of Systems: A 12 point ROS discussed and pertinent positives are indicated in the HPI above.  All other systems are negative.  Review of Systems  Constitutional: Positive for activity change. Negative for appetite change, fatigue and fever.  Respiratory:  Negative for cough and shortness of breath.   Cardiovascular: Negative for chest pain.  Gastrointestinal: Negative for abdominal pain.  Musculoskeletal: Positive for back pain and gait problem.  Neurological: Negative for weakness.  Psychiatric/Behavioral: Negative for behavioral problems and confusion.    Vital Signs: BP (!) 146/57   Pulse 60   Temp 98.3 F (36.8 C) (Temporal)   Ht 6' (1.829 m)   Wt 214 lb (97.1 kg)   SpO2 96%   BMI 29.02 kg/m   Physical Exam  Constitutional: He is oriented to person, place, and time.  Cardiovascular: Normal rate, regular rhythm and normal heart sounds.  Pulmonary/Chest: Effort normal and breath sounds normal.  Abdominal: Soft. Bowel sounds are normal.  Musculoskeletal: Normal range of motion.  Low back pain  Neurological: He is alert and oriented to person, place, and time.  Skin: Skin is warm and dry.  Psychiatric: He has a normal mood and affect. His behavior is normal. Judgment and thought content normal.  Vitals reviewed.   Imaging: No results found.  Labs:  CBC: Recent Labs    10/31/17 1227 11/08/17 1358 11/16/17 0549 12/18/17 1127 03/20/18 1008  WBC 4.3 4.8  --  5.7 4.8  HGB 11.8* 11.6* 9.5* 10.7* 10.9*  HCT 36.2* 36.4* 30.4* 33.9* 35.3*  PLT 354.0 298  --  324 384    COAGS: Recent Labs    06/18/17 1751 10/14/17 1502 10/15/17 0749 10/16/17 0411  INR 1.02  --   --   --  APTT  --  81* 92* 98*    BMP: Recent Labs    10/16/17 0411  11/08/17 1358 11/16/17 0549 12/18/17 1127 03/20/18 1008  NA 139   < > 142 139 140 144  K 3.7   < > 4.6 3.9 4.2 4.3  CL 110   < > 107 104 106 106  CO2 20*   < > 22 22 22 29   GLUCOSE 107*   < > 114* 156* 124 140*  BUN 8   < > 23* 17 21 24*  CALCIUM 9.3   < > 9.2 8.4* 9.5 9.1  CREATININE 1.51*   < > 1.66* 1.62* 1.81* 1.90*  GFRNONAA 42*  --  37* 38* 34*  --   GFRAA 49*  --  43* 45* 39*  --    < > = values in this interval not displayed.    LIVER FUNCTION TESTS: Recent  Labs    10/14/17 0623 10/31/17 1227 12/18/17 1127 03/20/18 1008  BILITOT 0.9 0.5 0.4 0.7  AST 18 19 23 27   ALT 15* 16 14 28   ALKPHOS 40 51 79 80  PROT 5.8* 7.1 7.0 7.2  ALBUMIN 3.5 4.3 3.9 3.8    TUMOR MARKERS: No results for input(s): AFPTM, CEA, CA199, CHROMGRNA in the last 8760 hours.  Assessment and Plan:  Sacral insufficiency fracture Painful - meds not relieving Scheduled for Right possibly left sacroplasty Risks and benefits of sacroplasty were discussed with the patient including, but not limited to education regarding the natural healing process of compression fractures without intervention, bleeding, infection, cement migration which may cause spinal cord damage, paralysis, pulmonary embolism or even death.  This interventional procedure involves the use of X-rays and because of the nature of the planned procedure, it is possible that we will have prolonged use of X-ray fluoroscopy.  Potential radiation risks to you include (but are not limited to) the following: - A slightly elevated risk for cancer  several years later in life. This risk is typically less than 0.5% percent. This risk is low in comparison to the normal incidence of human cancer, which is 33% for women and 50% for men according to the [employer] [place]. - Radiation induced injury can include skin redness, resembling a rash, tissue breakdown / ulcers and hair loss (which can be temporary or permanent).   The likelihood of either of these occurring depends on the difficulty of the procedure and whether you are sensitive to radiation due to previous procedures, disease, or genetic conditions.   IF your procedure requires a prolonged use of radiation, you will be notified and given written instructions for further action.  It is your responsibility to monitor the irradiated area for the 2 weeks following the procedure and to notify your physician if you are concerned that you have suffered a radiation  induced injury.    All of the patient's questions were answered, patient is agreeable to proceed.  Consent signed and in chart.  Thank you for this interesting consult.  I greatly enjoyed meeting Louis [name]. and look forward to participating in their care.  A copy of this report was sent to the requesting provider on this date.  Electronically Signed: Lavonia Drafts, PA-C 05/08/2018, 9:55 AM   I spent a total of  30 Minutes   in face to face in clinical consultation, greater than 50% of which was counseling/coordinating care for sacroplasty

## 2018-05-08 NOTE — Progress Notes (Signed)
Pt c/o back pain spoke with Lennette Bihari, [place] and new order noted.

## 2018-05-08 NOTE — Procedures (Signed)
S/P bilateral sacroplasty .Biopsies obtained per referring MD

## 2018-05-08 NOTE — Discharge Instructions (Addendum)
1. No stooping,bending or lifting more than 10 lbs for 2 weeks. 2.Use walker to ambulate for 2 weeks. 3.RTC PRN 2 weeks  4.No driving for 2 weeks  KYPHOPLASTY/VERTEBROPLASTY DISCHARGE INSTRUCTIONS  Medications: (check all that apply)     Resume all home medications as before procedure.       Resume your (aspirin/Plavix/Coumadin) on                  Continue your pain medications as prescribed as needed.  Over the next 3-5 days, decrease your pain medication as tolerated.  Over the counter medications (i.e. Tylenol, ibuprofen, and aleve) may be substituted once severe/moderate pain symptoms have subsided.   Wound Care: - Bandages may be removed the day following your procedure.  You may get your incision wet once bandages are removed.  Bandaids may be used to cover the incisions until scab formation.  Topical ointments are optional.  - If you develop a fever greater than 101 degrees, have increased skin redness at the incision sites or pus-like oozing from incisions occurring within 1 week of the procedure, contact radiology at [phone] or [phone].  - Ice pack to back for 15-20 minutes 2-3 time per day for first 2-3 days post procedure.  The ice will expedite muscle healing and help with the pain from the incisions.   Activity: - Bedrest today with limited activity for 24 hours post procedure.  - No driving for 48 hours.  - Increase your activity as tolerated after bedrest (with assistance if necessary).  - Refrain from any strenuous activity or heavy lifting (greater than 10 lbs.).   Follow up: - Contact radiology at [phone] or [phone] if any questions/concerns.  - A physician assistant from radiology will contact you in approximately 1 week.  - If a biopsy was performed at the time of your procedure, your referring physician should receive the results in usually 2-3 days.

## 2018-05-12 ENCOUNTER — Other Ambulatory Visit: Payer: Self-pay | Admitting: Family Medicine

## 2018-05-13 ENCOUNTER — Encounter (HOSPITAL_COMMUNITY): Payer: Self-pay | Admitting: Interventional Radiology

## 2018-05-16 ENCOUNTER — Other Ambulatory Visit: Payer: Self-pay | Admitting: Medical

## 2018-05-16 MED FILL — MAGIC MW LID/MAAL/DP1:1:1: 2 | 10 days supply | Qty: 200 | Fill #0

## 2018-05-19 ENCOUNTER — Other Ambulatory Visit: Payer: Self-pay | Admitting: Family Medicine

## 2018-05-19 DIAGNOSIS — F411 Generalized anxiety disorder: Secondary | ICD-10-CM

## 2018-05-19 NOTE — Telephone Encounter (Signed)
Last alprazolam RX: 03/20/18, #30 Last OV: 01/03/18 Next OV: 07/04/18 UDS: 10/11/16, low risk. Past due CSC: 10/11/16. Needs updating CSR: Report printed and placed in PCP's red folder for review

## 2018-05-20 ENCOUNTER — Telehealth: Payer: Self-pay

## 2018-05-20 NOTE — Telephone Encounter (Signed)
Tried initiated PA through [employer]; KEY: [id number]. Informed that this medication is excluded from patient's plan benefits.

## 2018-05-22 ENCOUNTER — Other Ambulatory Visit: Payer: Self-pay | Admitting: Radiology

## 2018-05-23 NOTE — Telephone Encounter (Signed)
Notified pt that insurance is denying to cover medication and he may need to ask pharmacy to pay out of pocket cost going forward. Pt voices understanding.

## 2018-06-06 MED FILL — MAGIC MW LID/MAAL/DP1:1:1: 2 | 10 days supply | Qty: 200 | Fill #0

## 2018-06-20 ENCOUNTER — Other Ambulatory Visit: Payer: Self-pay | Admitting: Family Medicine

## 2018-06-20 DIAGNOSIS — F411 Generalized anxiety disorder: Secondary | ICD-10-CM

## 2018-06-20 NOTE — Telephone Encounter (Signed)
Last refill on 05/19/2018   #30 no refills Last office visit on 01/03/2018 Last UDS on 05/21/2017

## 2018-06-23 ENCOUNTER — Other Ambulatory Visit: Payer: Self-pay | Admitting: *Deleted

## 2018-06-23 ENCOUNTER — Telehealth: Payer: Self-pay | Admitting: Family Medicine

## 2018-06-23 DIAGNOSIS — Z23 Encounter for immunization: Secondary | ICD-10-CM | POA: Diagnosis not present

## 2018-06-23 MED ORDER — MAGIC MOUTHWASH: ORAL | 11 refills | Status: DC

## 2018-06-23 MED FILL — MAGIC MW LID/MAAL/DP1:1:1: 2 | 10 days supply | Qty: 200 | Fill #0

## 2018-06-23 NOTE — Telephone Encounter (Signed)
Pt is asking if the magic wash that he gets could he get more than 1 month supply so he don't have to call every month to get prescription. Please advise.

## 2018-06-23 NOTE — Telephone Encounter (Signed)
Patient came in the office requesting refill for magic mouth wash.

## 2018-06-24 DIAGNOSIS — M19012 Primary osteoarthritis, left shoulder: Secondary | ICD-10-CM | POA: Diagnosis not present

## 2018-06-24 MED ORDER — MAGIC MOUTHWASH: ORAL | 11 refills | Status: DC

## 2018-06-24 NOTE — Telephone Encounter (Signed)
Please advise 

## 2018-06-24 NOTE — Telephone Encounter (Signed)
Please let pt know

## 2018-06-24 NOTE — Telephone Encounter (Signed)
It looks like provider Viona Gilmore NP wrote a script yesterday with 11 refills. Is pt aware of this?

## 2018-06-24 NOTE — Telephone Encounter (Signed)
Rx authorized/signed by Guerry Bruin for a Arnetha Massy by mistake. Rx printed- and refaxed to [facility].

## 2018-06-25 ENCOUNTER — Encounter: Payer: Self-pay | Admitting: Hematology & Oncology

## 2018-06-25 ENCOUNTER — Other Ambulatory Visit: Payer: Self-pay

## 2018-06-25 ENCOUNTER — Inpatient Hospital Stay: Payer: Medicare Other

## 2018-06-25 ENCOUNTER — Inpatient Hospital Stay: Payer: Medicare Other | Attending: Hematology & Oncology | Admitting: Hematology & Oncology

## 2018-06-25 VITALS — BP 140/55 | HR 88 | Temp 98.2°F | Resp 20 | Wt 222.4 lb

## 2018-06-25 DIAGNOSIS — I82531 Chronic embolism and thrombosis of right popliteal vein: Secondary | ICD-10-CM | POA: Diagnosis not present

## 2018-06-25 DIAGNOSIS — Z86711 Personal history of pulmonary embolism: Secondary | ICD-10-CM | POA: Diagnosis not present

## 2018-06-25 DIAGNOSIS — I82541 Chronic embolism and thrombosis of right tibial vein: Secondary | ICD-10-CM | POA: Insufficient documentation

## 2018-06-25 DIAGNOSIS — Z7901 Long term (current) use of anticoagulants: Secondary | ICD-10-CM

## 2018-06-25 DIAGNOSIS — I2601 Septic pulmonary embolism with acute cor pulmonale: Secondary | ICD-10-CM

## 2018-06-25 DIAGNOSIS — D638 Anemia in other chronic diseases classified elsewhere: Secondary | ICD-10-CM

## 2018-06-25 LAB — CBC WITH DIFFERENTIAL (CANCER CENTER ONLY)
BASOS PCT: 0 %
Basophils Absolute: 0 10*3/uL (ref 0.0–0.1)
EOS ABS: 0.2 10*3/uL (ref 0.0–0.5)
EOS PCT: 3 %
HCT: 33.7 % — ABNORMAL LOW (ref 38.7–49.9)
Hemoglobin: 10.4 g/dL — ABNORMAL LOW (ref 13.0–17.1)
LYMPHS ABS: 0.9 10*3/uL (ref 0.9–3.3)
Lymphocytes Relative: 15 %
MCH: 29.2 pg (ref 28.0–33.4)
MCHC: 30.9 g/dL — AB (ref 32.0–35.9)
MCV: 94.7 fL (ref 82.0–98.0)
Monocytes Absolute: 0.4 10*3/uL (ref 0.1–0.9)
Monocytes Relative: 6 %
NEUTROS PCT: 76 %
Neutro Abs: 4.6 10*3/uL (ref 1.5–6.5)
PLATELETS: 343 10*3/uL (ref 145–400)
RBC: 3.56 MIL/uL — AB (ref 4.20–5.70)
RDW: 18.2 % — ABNORMAL HIGH (ref 11.1–15.7)
WBC: 6 10*3/uL (ref 4.0–10.0)

## 2018-06-25 LAB — CMP (CANCER CENTER ONLY)
ALBUMIN: 3.6 g/dL (ref 3.5–5.0)
ALT: 26 U/L (ref 10–47)
AST: 34 U/L (ref 11–38)
Alkaline Phosphatase: 71 U/L (ref 26–84)
Anion gap: 2 — ABNORMAL LOW (ref 5–15)
BUN: 17 mg/dL (ref 7–22)
CHLORIDE: 113 mmol/L — AB (ref 98–108)
CO2: 26 mmol/L (ref 18–33)
CREATININE: 1.7 mg/dL — AB (ref 0.60–1.20)
Calcium: 9.4 mg/dL (ref 8.0–10.3)
GLUCOSE: 153 mg/dL — AB (ref 73–118)
POTASSIUM: 3.8 mmol/L (ref 3.3–4.7)
SODIUM: 141 mmol/L (ref 128–145)
Total Bilirubin: 0.5 mg/dL (ref 0.2–1.6)
Total Protein: 6.7 g/dL (ref 6.4–8.1)

## 2018-06-25 NOTE — Progress Notes (Signed)
Hematology and Oncology Follow Up Visit  Louis [name] [id number] [birth year] 82 y.o. 06/25/2018   Principle Diagnosis:   Recurrent pulmonary embolism - ? 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  He has been having some issues.  He had sacral plasty back in August.  This seemed to help him.  He saw his orthopedist yesterday.  This was for his right knee.  I guess his right knee is having some issues.  As far as his hemochromatosis is concerned, this is not been a problem.  His ferritin was 23 with an iron saturation of 56%.  We are just watching this for right now.  He is doing well with his Eliquis.  He has a compression stocking on the right leg.  He says this really helps out quite a bit.  His appetite has been okay.  He has had no nausea or vomiting.  He has had no cough.  He has had no bleeding.  Overall, his performance status is ECOG 1.    Medications:  Current Outpatient Medications:  .  ALPRAZolam (XANAX) 0.5 MG tablet, TAKE 1/2 TABLET BY MOUTH EVERY 8 TO 12 HOURS AS NEEDED, Disp: 30 tablet, Rfl: 0 .  amoxicillin (AMOXIL) 500 MG capsule, Take 2,000 mg by mouth See admin instructions. For Dental Procedures, Disp: , Rfl:  .  atorvastatin (LIPITOR) 20 MG tablet, TAKE 1 TABLET DAILY AT 6 P.M., Disp: 90 tablet, Rfl: 1 .  CALCIUM-VITAMIN D PO, Take 1 tablet by mouth daily. , Disp: , Rfl:  .  ELIQUIS 5 MG TABS tablet, TAKE 1 TABLET TWICE A DAY, Disp: 180 tablet, Rfl: 1 .  fenofibrate 160 MG tablet, TAKE 1 TABLET(160 MG) BY MOUTH DAILY, Disp: 90 tablet, Rfl: 0 .  fluticasone (FLONASE) 50 MCG/ACT nasal spray, Place 2 sprays into both nostrils daily. (Patient taking differently: Place 2 sprays into both nostrils daily as needed for allergies or rhinitis. ), Disp: 16 g, Rfl: 1 .  folic acid (FOLVITE) 1 MG  tablet, Take 1 mg by mouth daily.  , Disp: , Rfl:  .  glucose blood test strip, OneTouch Verio strips  U ONCE D UTD, Disp: , Rfl:  .  HYDROcodone-acetaminophen (NORCO) 10-325 MG tablet, Take 1 tablet by mouth every 4 (four) hours as needed for moderate pain or severe pain (pain)., Disp: 30 tablet, Rfl: 0 .  levothyroxine (SYNTHROID, LEVOTHROID) 125 MCG tablet, TAKE 1 TABLET DAILY, Disp: 90 tablet, Rfl: 0 .  lidocaine (XYLOCAINE) 2 % solution, SWISH AND SPIT 5MLS BY MOUTH 4 TIMES DAILY. DO NOT SWALLOW, Disp: 200 mL, Rfl: 0 .  Liniments (BLUE-EMU SUPER STRENGTH EX), Apply 1 application topically daily as needed (pain)., Disp: , Rfl:  .  lisinopril (PRINIVIL,ZESTRIL) 5 MG tablet, 5 mg daily., Disp: , Rfl:  .  magic mouthwash SOLN, 5 ml po qid swish and spit, Disp: 200 mL, Rfl: 11 .  methocarbamol (ROBAXIN) 500 MG tablet, Take 1 tablet (500 mg total) by mouth every 6 (six) hours as needed for muscle spasms., Disp: 40 tablet, Rfl: 3 .  methotrexate (RHEUMATREX) 2.5 MG tablet, Take 20 mg by mouth every Monday. , Disp: , Rfl:  .  multivitamin (THERAGRAN) per tablet, Take 1 tablet by mouth daily.  , Disp: , Rfl:  .  ondansetron (  ZOFRAN) 4 MG tablet, Take 1 tablet (4 mg total) by mouth every 8 (eight) hours as needed for nausea or vomiting., Disp: 20 tablet, Rfl: 0 .  pantoprazole (PROTONIX) 40 MG tablet, TAKE 1 TABLET DAILY, Disp: 90 tablet, Rfl: 1 .  pioglitazone (ACTOS) 15 MG tablet, Take 1 tablet (15 mg total) by mouth daily., Disp: 90 tablet, Rfl: 1 .  polyethylene glycol (MIRALAX) packet, Take 17 g by mouth daily as needed for moderate constipation. OTC, Disp: 30 each, Rfl: 3 .  vitamin B-12 (CYANOCOBALAMIN) 1000 MCG tablet, Take 1,000 mcg by mouth daily., Disp: , Rfl:  .  hydrOXYzine (ATARAX/VISTARIL) 10 MG tablet, 1 tab po q hs  as needed for itching (Patient taking differently: Take 10 mg by mouth at bedtime as needed for itching. 1 tab po q hs  as needed for itching), Disp: 5 tablet, Rfl: 0 .   ondansetron (ZOFRAN) 4 MG tablet, TAKE 1 TABLET EVERY 6 HOURS AS NEEDED FOR NAUSEA OR VOMITING, Disp: 40 tablet, Rfl: 0  Allergies:  Allergies  Allergen Reactions  . Shellfish Allergy Anaphylaxis, Hives and Swelling    ANGIOEDEMA  . Keflex [Cephalexin] Other (See Comments)     Potential steven johnson syndrome  . Garlic Nausea And Vomiting    And onions  . Iron Other (See Comments) and Hives    HEMACHROMATOSIS HEMACHROMATOSIS    Past Medical History, Surgical history, Social history, and Family History were reviewed and updated.  Review of Systems: Review of Systems  Constitutional: Negative for appetite change, fatigue, fever and unexpected weight change.  HENT:   Negative for lump/mass, mouth sores, sore throat and trouble swallowing.   Respiratory: Negative for cough, hemoptysis and shortness of breath.   Cardiovascular: Negative for leg swelling and palpitations.  Gastrointestinal: Negative for abdominal distention, abdominal pain, blood in stool, constipation, diarrhea, nausea and vomiting.  Genitourinary: Negative for bladder incontinence, dysuria, frequency and hematuria.   Musculoskeletal: Negative for arthralgias, back pain, gait problem and myalgias.  Skin: Negative for itching and rash.  Neurological: Negative for dizziness, extremity weakness, gait problem, headaches, numbness, seizures and speech difficulty.  Hematological: Does not bruise/bleed easily.  Psychiatric/Behavioral: Negative for depression and sleep disturbance. The patient is not nervous/anxious.     Physical Exam:  weight is 222 lb 6.4 oz (100.9 kg). His oral temperature is 98.2 F (36.8 C). His blood pressure is 140/55 (abnormal) and his pulse is 88. His respiration is 20 and oxygen saturation is 100%.   Wt Readings from Last 3 Encounters:  06/25/18 222 lb 6.4 oz (100.9 kg)  05/08/18 214 lb (97.1 kg)  03/20/18 217 lb (98.4 kg)    Physical Exam  Constitutional: He is oriented to person, place,  and time.  HENT:  Head: Normocephalic and atraumatic.  Mouth/Throat: Oropharynx is clear and moist.  Eyes: Pupils are equal, round, and reactive to light. EOM are normal.  Neck: Normal range of motion.  Cardiovascular: Normal rate, regular rhythm and normal heart sounds.  Pulmonary/Chest: Effort normal and breath sounds normal.  Abdominal: Soft. Bowel sounds are normal.  Musculoskeletal: Normal range of motion. He exhibits no edema, tenderness or deformity.  Lymphadenopathy:    He has no cervical adenopathy.  Neurological: He is alert and oriented to person, place, and time.  Skin: Skin is warm and dry. No rash noted. No erythema.  Psychiatric: He has a normal mood and affect. His behavior is normal. Judgment and thought content normal.  Vitals reviewed.    Lab  Results  Component Value Date   WBC 6.0 06/25/2018   HGB 10.4 (L) 06/25/2018   HCT 33.7 (L) 06/25/2018   MCV 94.7 06/25/2018   PLT 343 06/25/2018     Chemistry      Component Value Date/Time   NA 141 06/25/2018 1127   NA 148 (H) 09/16/2017 1051   K 3.8 06/25/2018 1127   K 4.6 09/16/2017 1051   CL 113 (H) 06/25/2018 1127   CL 107 09/16/2017 1051   CO2 26 06/25/2018 1127   CO2 29 09/16/2017 1051   BUN 17 06/25/2018 1127   BUN 20 09/16/2017 1051   CREATININE 1.70 (H) 06/25/2018 1127   CREATININE 1.7 (H) 09/16/2017 1051      Component Value Date/Time   CALCIUM 9.4 06/25/2018 1127   CALCIUM 10.1 09/16/2017 1051   ALKPHOS 71 06/25/2018 1127   ALKPHOS 62 09/16/2017 1051   AST 34 06/25/2018 1127   ALT 26 06/25/2018 1127   ALT 29 09/16/2017 1051   BILITOT 0.5 06/25/2018 1127         Impression and Plan: Louis [name] is an 82 year old white male.  He has had past thromboembolic events.  He has had past pulmonary emboli.  Right now, we will just follow him along supportively.  We will see what his iron levels look like.  I just do not see that we have to do any Dopplers of his leg.  He will always have this  thrombus.  He will be on lifelong anticoagulation.  We did check his lupus anticoagulant back in June.  There is no presence of a lupus anticoagulant.  I will plan to see him back in 4 months now.  I will get him through the holidays.  I should think that everything should be okay.   Volanda Napoleon, MD 9/25/20191:03 PM

## 2018-06-26 ENCOUNTER — Telehealth: Payer: Self-pay | Admitting: *Deleted

## 2018-06-26 DIAGNOSIS — M5136 Other intervertebral disc degeneration, lumbar region: Secondary | ICD-10-CM | POA: Diagnosis not present

## 2018-06-26 DIAGNOSIS — Z79899 Other long term (current) drug therapy: Secondary | ICD-10-CM | POA: Diagnosis not present

## 2018-06-26 DIAGNOSIS — Z79891 Long term (current) use of opiate analgesic: Secondary | ICD-10-CM | POA: Diagnosis not present

## 2018-06-26 DIAGNOSIS — M545 Low back pain: Secondary | ICD-10-CM | POA: Diagnosis not present

## 2018-06-26 LAB — IRON AND TIBC
Iron: 85 ug/dL (ref 42–163)
Saturation Ratios: 29 % — ABNORMAL LOW (ref 42–163)
TIBC: 298 ug/dL (ref 202–409)
UIBC: 213 ug/dL

## 2018-06-26 LAB — FERRITIN: FERRITIN: 19 ng/mL — AB (ref 24–336)

## 2018-06-26 NOTE — Telephone Encounter (Addendum)
Patient is aware of results  ----- Message from Volanda Napoleon, MD sent at 06/26/2018  9:38 AM EDT ----- Call - iron level is great!!  Laurey Arrow

## 2018-06-27 LAB — DRVVT MIX: DRVVT MIX: 46.5 s (ref 0.0–47.0)

## 2018-06-27 LAB — LUPUS ANTICOAGULANT PANEL
DRVVT: 61.8 s — ABNORMAL HIGH (ref 0.0–47.0)
PTT Lupus Anticoagulant: 34.9 s (ref 0.0–51.9)

## 2018-07-01 DIAGNOSIS — M25561 Pain in right knee: Secondary | ICD-10-CM | POA: Diagnosis not present

## 2018-07-04 ENCOUNTER — Ambulatory Visit: Payer: Medicare Other | Admitting: Family Medicine

## 2018-07-04 ENCOUNTER — Ambulatory Visit: Payer: Medicare Other | Admitting: *Deleted

## 2018-07-09 ENCOUNTER — Other Ambulatory Visit (HOSPITAL_COMMUNITY): Payer: Self-pay | Admitting: Surgical

## 2018-07-09 DIAGNOSIS — M25561 Pain in right knee: Secondary | ICD-10-CM

## 2018-07-10 MED FILL — MAGIC MW LID/MAAL/DP1:1:1: 2 | 10 days supply | Qty: 200 | Fill #1

## 2018-07-11 ENCOUNTER — Encounter (HOSPITAL_COMMUNITY)
Admission: RE | Admit: 2018-07-11 | Discharge: 2018-07-11 | Disposition: A | Discharge status: Home / Self Care | Payer: Medicare Other | Source: Ambulatory Visit | Attending: Surgical | Admitting: Surgical

## 2018-07-11 ENCOUNTER — Ambulatory Visit (HOSPITAL_COMMUNITY)
Admission: RE | Admit: 2018-07-11 | Discharge: 2018-07-11 | Disposition: A | Discharge status: Home / Self Care | Payer: Medicare Other | Source: Ambulatory Visit | Attending: Surgical | Admitting: Surgical

## 2018-07-11 DIAGNOSIS — M25561 Pain in right knee: Secondary | ICD-10-CM | POA: Insufficient documentation

## 2018-07-11 MED ORDER — TECHNETIUM TC 99M MEDRONATE IV KIT
21.4000 | PACK | Freq: Once | INTRAVENOUS | Status: AC | PRN
  Administered 2018-07-11: 21.4 via INTRAVENOUS

## 2018-07-14 NOTE — Progress Notes (Signed)
Subjective:   Louis [name]. is a 82 y.o. male who presents for Medicare Annual/Subsequent preventive examination.  Review of Systems: No ROS.  Medicare Wellness Visit. Additional risk factors are reflected in the social history. Cardiac Risk Factors include: advanced age (>80men, >45 women);diabetes mellitus;dyslipidemia;hypertension;male gender Sleep patterns: no issues Home Safety/Smoke Alarms: Feels safe in home. Smoke alarms in place. Lives with wife on 1 level with finished basement.Able to navigate stairs slowly.  Male:   CCS-No longer doing routine screening due to age. Eye- Dr.Allen yearly PSA-  Lab Results  Component Value Date   PSA 1.67 10/11/2016   PSA 0.86 08/28/2010       Objective:    Vitals: BP (!) 146/80 (BP Location: Left Arm, Patient Position: Sitting, Cuff Size: Normal)   Pulse 61   Ht 6\' 1"  (1.854 m)   Wt 221 lb 12.8 oz (100.6 kg)   SpO2 96%   BMI 29.26 kg/m   Body mass index is 29.26 kg/m.  Advanced Directives 06/25/2018 05/08/2018 03/20/2018 12/18/2017 11/08/2017 10/16/2017 10/13/2017  Does Patient Have a Medical Advance Directive? Yes Yes Yes Yes Yes - No  Type of Paramedic of [place];Living will [place];Living will [place];Living will [place];Living will - - -  Does patient want to make changes to medical advance directive? - No - Patient declined - - - - -  Copy of [place] in Chart? - No - copy requested - - - - -  Would patient like information on creating a medical advance directive? - - - - - No - Patient declined -    Tobacco Social History   Tobacco Use  Smoking Status Former Smoker  . Last attempt to quit: 10/01/1980  . Years since quitting: 37.8  Smokeless Tobacco Never Used     Counseling given: Not Answered   Clinical Intake: Pain : No/denies pain    Past Medical History:  Diagnosis Date  . Anemia   . Arthritis    Dr Amil Amen, [place] rx- Rheumatology  . CKD (chronic kidney disease)   . Diabetes mellitus    takes 1 pill  . Diverticulosis   . GERD (gastroesophageal reflux disease)   . Hemochromatosis     Dr Henrene Pastor  . History of kidney stones 1980   passed spont.  Marland Kitchen HOH (hard of hearing)    congenital hearing loss  . Hx of adenomatous colonic polyps   . Hyperlipidemia   . Hypertension   . Hypothyroidism   . IBS (irritable bowel syndrome)   . Internal hemorrhoids   . Kidney stones, calcium oxalate    1985 & 2005  . Pulmonary embolism ([place]) 2015   hosp. with Heparin & then on Xarelto- 3 months or so  . Reflux esophagitis   . SBO (small bowel obstruction) ([place]) 2004  . Spinal stenosis    Dr.Ramos  . Testosterone deficiency    111 in 2007   Past Surgical History:  Procedure Laterality Date  . APPENDECTOMY    . cataract  2005   bilaterally  . COLONOSCOPY W/ POLYPECTOMY  2005   tubular adenoma  . IR SACROPLASTY BILATERAL  05/08/2018  . JOINT REPLACEMENT     L- shoulder, x2, replacement on both knees   . L4-5 & L5- S1 bilat facet injections  01/20/14    Dr Nelva Bush  . LUMBAR LAMINECTOMY WITH COFLEX 2 LEVEL N/A 07/30/2016   Procedure: Lumbar three-  four Lumbar four - five Laminectomy and foraminotomy;  Surgeon: Kristeen Miss, MD;  Location: [facility];  Service: Neurosurgery;  Laterality: N/A;  L3-4 L4-5 Laminectomy with coflex  . lumbar nerve block      X 14; Dr Nelva Bush  . MIDDLE EAR SURGERY  1980   Teflon tack   . REFRACTIVE SURGERY  2005   SE Ophth  . REVERSE SHOULDER ARTHROPLASTY Right 11/15/2017   Procedure: RIGHT REVERSE SHOULDER ARTHROPLASTY;  Surgeon: Netta Cedars, MD;  Location: [facility];  Service: Orthopedics;  Laterality: Right;  . ROTATOR CUFF REPAIR     3 on right shoulder  . TM replacement  1981  . TONSILLECTOMY AND ADENOIDECTOMY    . TOTAL KNEE ARTHROPLASTY   201 & 2012    bilaterally;Dr Alusio  . TOTAL SHOULDER REPLACEMENT     L   Family History  Problem Relation Age of Onset  .  Diabetes Maternal Aunt   . Depression Brother        suicide  . Diabetes Father   . Stroke Father 31       MI in hospital for CVA  . Heart failure Mother        valvular disease  . Heart disease Paternal Aunt         2 aunts; 1 had MI @ ?> 46  . Heart disease Paternal Uncle           . Breast cancer Maternal Grandmother   . Colon cancer Neg Hx   . COPD Neg Hx   . Asthma Neg Hx    Social History   Socioeconomic History  . Marital status: Married    Spouse name: Not on file  . Number of children: Not on file  . Years of education: Not on file  . Highest education level: Not on file  Occupational History  . Not on file  Social Needs  . Financial resource strain: Not on file  . Food insecurity:    Worry: Not on file    Inability: Not on file  . Transportation needs:    Medical: Not on file    Non-medical: Not on file  Tobacco Use  . Smoking status: Former Smoker    Last attempt to quit: 10/01/1980    Years since quitting: 37.8  . Smokeless tobacco: Never Used  Substance and Sexual Activity  . Alcohol use: Yes    Alcohol/week: 0.0 standard drinks    Comment: rare   . Drug use: No  . Sexual activity: Not Currently  Lifestyle  . Physical activity:    Days per week: Not on file    Minutes per session: Not on file  . Stress: Not on file  Relationships  . Social connections:    Talks on phone: Not on file    Gets together: Not on file    Attends religious service: Not on file    Active member of club or organization: Not on file    Attends meetings of clubs or organizations: Not on file    Relationship status: Not on file  Other Topics Concern  . Not on file  Social History Narrative   Daily caffeine     Outpatient Encounter Medications as of 07/15/2018  Medication Sig  . ALPRAZolam (XANAX) 0.5 MG tablet TAKE 1/2 TABLET BY MOUTH EVERY 8 TO 12 HOURS AS NEEDED  . atorvastatin (LIPITOR) 20 MG tablet TAKE 1 TABLET DAILY AT 6 P.M.  . CALCIUM-VITAMIN D PO Take 1  tablet by mouth daily.   Marland Kitchen ELIQUIS 5 MG TABS tablet TAKE 1 TABLET TWICE A DAY  . fenofibrate 160 MG tablet TAKE 1 TABLET(160 MG) BY MOUTH DAILY  . fluticasone (FLONASE) 50 MCG/ACT nasal spray Place 2 sprays into both nostrils daily. (Patient taking differently: Place 2 sprays into both nostrils daily as needed for allergies or rhinitis. )  . folic acid (FOLVITE) 1 MG tablet Take 1 mg by mouth daily.    Marland Kitchen glucose blood test strip OneTouch Verio strips  U ONCE D UTD  . HYDROcodone-acetaminophen (NORCO) 10-325 MG tablet Take 1 tablet by mouth every 4 (four) hours as needed for moderate pain or severe pain (pain).  . hydrOXYzine (ATARAX/VISTARIL) 10 MG tablet 1 tab po q hs  as needed for itching (Patient taking differently: Take 10 mg by mouth at bedtime as needed for itching. 1 tab po q hs  as needed for itching)  . levothyroxine (SYNTHROID, LEVOTHROID) 125 MCG tablet TAKE 1 TABLET DAILY  . lidocaine (XYLOCAINE) 2 % solution SWISH AND SPIT 5MLS BY MOUTH 4 TIMES DAILY. DO NOT SWALLOW  . Liniments (BLUE-EMU SUPER STRENGTH EX) Apply 1 application topically daily as needed (pain).  Marland Kitchen lisinopril (PRINIVIL,ZESTRIL) 5 MG tablet 5 mg daily.  . magic mouthwash SOLN 5 ml po qid swish and spit  . methocarbamol (ROBAXIN) 500 MG tablet Take 1 tablet (500 mg total) by mouth every 6 (six) hours as needed for muscle spasms.  . methotrexate (RHEUMATREX) 2.5 MG tablet Take 20 mg by mouth every Monday.   . multivitamin (THERAGRAN) per tablet Take 1 tablet by mouth daily.    . ondansetron (ZOFRAN) 4 MG tablet Take 1 tablet (4 mg total) by mouth every 8 (eight) hours as needed for nausea or vomiting.  . ondansetron (ZOFRAN) 4 MG tablet TAKE 1 TABLET EVERY 6 HOURS AS NEEDED FOR NAUSEA OR VOMITING  . pantoprazole (PROTONIX) 40 MG tablet TAKE 1 TABLET DAILY  . pioglitazone (ACTOS) 15 MG tablet Take 1 tablet (15 mg total) by mouth daily.  . polyethylene glycol (MIRALAX) packet Take 17 g by mouth daily as needed for  moderate constipation. OTC  . vitamin B-12 (CYANOCOBALAMIN) 1000 MCG tablet Take 1,000 mcg by mouth daily.  Marland Kitchen amoxicillin (AMOXIL) 500 MG capsule Take 2,000 mg by mouth See admin instructions. For Dental Procedures   No facility-administered encounter medications on file as of 07/15/2018.     Activities of Daily Living In your present state of health, do you have any difficulty performing the following activities: 07/15/2018 11/08/2017  Hearing? N Y  Comment - wears bil hearing aid  Vision? N N  Difficulty concentrating or making decisions? N N  Walking or climbing stairs? N N  Dressing or bathing? N N  Doing errands, shopping? N N  Preparing Food and eating ? N -  Using the Toilet? N -  In the past six months, have you accidently leaked urine? N -  Do you have problems with loss of bowel control? N -  Managing your Medications? N -  Managing your Finances? N -  Housekeeping or managing your Housekeeping? Y -  Comment housekeeper every 2 weeks. -  Some recent data might be hidden    Patient Care Team: Carollee Herter, Alferd Apa, DO as PCP - General (Family Medicine) Hennie Duos, MD as Consulting Physician (Rheumatology) Suella Broad, MD as Consulting Physician (Physical Medicine and Rehabilitation) [place], [place], [place] as Consulting Physician (Optometry) Irene Shipper, MD  as Consulting Physician (Gastroenterology) Sydell Axon, DDS as Consulting Physician (Dentistry) Alean Rinne, DDS as Consulting Physician (Dentistry) Edrick Oh, MD as Consulting Physician (Nephrology)   Assessment:   This is a routine wellness examination for [place]. Physical assessment deferred to PCP.  Exercise Activities and Dietary recommendations Current Exercise Habits: The patient does not participate in regular exercise at present, Intensity: Mild, Exercise limited by: None identified Diet (meal preparation, eat out, water intake, caffeinated beverages, dairy products, fruits and  vegetables): well balanced Breakfast: coffee and pop tart Dinner: vegetable, tea    Pt reports he needs to drink more water.  Goals    . Increase physical activity     Start walking again as tolerated.    . Increase water intake       Fall Risk Fall Risk  07/15/2018 05/01/2018 01/14/2017 10/11/2016 12/02/2015  Falls in the past year? No No No No No  Comment - Emmi Telephone Survey: data to providers prior to load - - -    Depression Screen PHQ 2/9 Scores 07/15/2018 01/14/2017 10/11/2016 12/02/2015  PHQ - 2 Score 0 0 0 0    Cognitive Function MMSE - Mini Mental State Exam 07/15/2018 01/14/2017  Orientation to time 5 5  Orientation to Place 5 5  Registration 3 3  Attention/ Calculation 5 4  Recall 2 2  Language- name 2 objects 2 2  Language- repeat 1 1  Language- follow 3 step command 3 3  Language- read & follow direction 1 1  Write a sentence 1 1  Copy design 1 1  Total score 29 28        Immunization History  Administered Date(s) Administered  . Influenza Whole 07/16/2007, 06/09/2008, 12/30/2008  . Influenza, High Dose Seasonal PF 08/03/2014, 05/27/2017  . Influenza,inj,quad, With Preservative 08/01/2017  . Influenza-Unspecified 09/30/2015, 07/01/2016, 06/23/2018  . Pneumococcal Conjugate-13 09/30/2015  . Pneumococcal Polysaccharide-23 04/15/2012  . Zoster 10/11/2014  . Zoster Recombinat (Shingrix) 05/27/2017, 08/06/2017    Screening Tests Health Maintenance  Topic Date Due  . TETANUS/TDAP  10/16/1954  . HEMOGLOBIN A1C  05/06/2018  . OPHTHALMOLOGY EXAM  01/01/2019  . FOOT EXAM  01/04/2019  . INFLUENZA VACCINE  Completed  . PNA vac Low Risk Adult  Completed       Plan:    Please schedule your next medicare wellness visit with me in 1 yr.  Continue to eat heart healthy diet (full of fruits, vegetables, whole grains, lean protein, water--limit salt, fat, and sugar intake) and increase physical activity as tolerated.  Continue doing brain stimulating  activities (puzzles, reading, adult coloring books, staying active) to keep memory sharp.    I have personally reviewed and noted the following in the patient's chart:   . Medical and social history . Use of alcohol, tobacco or illicit drugs  . Current medications and supplements . Functional ability and status . Nutritional status . Physical activity . Advanced directives . List of other physicians . Hospitalizations, surgeries, and ER visits in previous 12 months . Vitals . Screenings to include cognitive, depression, and falls . Referrals and appointments  In addition, I have reviewed and discussed with patient certain preventive protocols, quality metrics, and best practice recommendations. A written personalized care plan for preventive services as well as general preventive health recommendations were provided to patient.     Shela Nevin, [place]  07/15/2018

## 2018-07-15 ENCOUNTER — Ambulatory Visit (INDEPENDENT_AMBULATORY_CARE_PROVIDER_SITE_OTHER): Payer: Medicare Other | Admitting: Family Medicine

## 2018-07-15 ENCOUNTER — Encounter: Payer: Self-pay | Admitting: Family Medicine

## 2018-07-15 ENCOUNTER — Ambulatory Visit (INDEPENDENT_AMBULATORY_CARE_PROVIDER_SITE_OTHER): Payer: Medicare Other | Admitting: *Deleted

## 2018-07-15 ENCOUNTER — Encounter: Payer: Self-pay | Admitting: *Deleted

## 2018-07-15 VITALS — BP 146/80 | HR 61 | Temp 98.2°F | Resp 16 | Ht 73.0 in | Wt 221.0 lb

## 2018-07-15 VITALS — BP 146/80 | HR 61 | Ht 73.0 in | Wt 221.8 lb

## 2018-07-15 DIAGNOSIS — Z79899 Other long term (current) drug therapy: Secondary | ICD-10-CM

## 2018-07-15 DIAGNOSIS — I10 Essential (primary) hypertension: Secondary | ICD-10-CM

## 2018-07-15 DIAGNOSIS — E118 Type 2 diabetes mellitus with unspecified complications: Secondary | ICD-10-CM | POA: Diagnosis not present

## 2018-07-15 DIAGNOSIS — E785 Hyperlipidemia, unspecified: Secondary | ICD-10-CM

## 2018-07-15 DIAGNOSIS — I2699 Other pulmonary embolism without acute cor pulmonale: Secondary | ICD-10-CM | POA: Diagnosis not present

## 2018-07-15 DIAGNOSIS — F419 Anxiety disorder, unspecified: Secondary | ICD-10-CM

## 2018-07-15 DIAGNOSIS — Z0000 Encounter for general adult medical examination without abnormal findings: Secondary | ICD-10-CM

## 2018-07-15 DIAGNOSIS — F411 Generalized anxiety disorder: Secondary | ICD-10-CM | POA: Diagnosis not present

## 2018-07-15 MED ORDER — ALPRAZOLAM 0.5 MG PO TABS: ORAL_TABLET | ORAL | 0 refills | Status: DC

## 2018-07-15 NOTE — Patient Instructions (Signed)

## 2018-07-15 NOTE — Patient Instructions (Signed)
Please schedule your next medicare wellness visit with me in 1 yr.  Continue to eat heart healthy diet (full of fruits, vegetables, whole grains, lean protein, water--limit salt, fat, and sugar intake) and increase physical activity as tolerated.  Continue doing brain stimulating activities (puzzles, reading, adult coloring books, staying active) to keep memory sharp.    Louis [name] , Thank you for taking time to come for your Medicare Wellness Visit. I appreciate your ongoing commitment to your health goals. Please review the following plan we discussed and let me know if I can assist you in the future.   These are the goals we discussed: Goals    . Increase physical activity     Start walking again as tolerated.    . Increase water intake       This is a list of the screening recommended for you and due dates:  Health Maintenance  Topic Date Due  . Tetanus Vaccine  10/16/1954  . Hemoglobin A1C  05/06/2018  . Eye exam for diabetics  01/01/2019  . Complete foot exam   01/04/2019  . Flu Shot  Completed  . Pneumonia vaccines  Completed    Health Maintenance, Male A healthy lifestyle and preventive care is important for your health and wellness. Ask your health care provider about what schedule of regular examinations is right for you. What should I know about weight and diet? Eat a Healthy Diet  Eat plenty of vegetables, fruits, whole grains, low-fat dairy products, and lean protein.  Do not eat a lot of foods high in solid fats, added sugars, or salt.  Maintain a Healthy Weight Regular exercise can help you achieve or maintain a healthy weight. You should:  Do at least 150 minutes of exercise each week. The exercise should increase your heart rate and make you sweat (moderate-intensity exercise).  Do strength-training exercises at least twice a week.  Watch Your Levels of Cholesterol and Blood Lipids  Have your blood tested for lipids and cholesterol every 5 years  starting at 80 years of age. If you are at high risk for heart disease, you should start having your blood tested when you are 80 years old. You may need to have your cholesterol levels checked more often if: ? Your lipid or cholesterol levels are high. ? You are older than 80 years of age. ? You are at high risk for heart disease.  What should I know about cancer screening? Many types of cancers can be detected early and may often be prevented. Lung Cancer  You should be screened every year for lung cancer if: ? You are a current smoker who has smoked for at least 30 years. ? You are a former smoker who has quit within the past 15 years.  Talk to your health care provider about your screening options, when you should start screening, and how often you should be screened.  Colorectal Cancer  Routine colorectal cancer screening usually begins at 80 years of age and should be repeated every 5-10 years until you are 80 years old. You may need to be screened more often if early forms of precancerous polyps or small growths are found. Your health care provider may recommend screening at an earlier age if you have risk factors for colon cancer.  Your health care provider may recommend using home test kits to check for hidden blood in the stool.  A small camera at the end of a tube can be used to examine your  colon (sigmoidoscopy or colonoscopy). This checks for the earliest forms of colorectal cancer.  Prostate and Testicular Cancer  Depending on your age and overall health, your health care provider may do certain tests to screen for prostate and testicular cancer.  Talk to your health care provider about any symptoms or concerns you have about testicular or prostate cancer.  Skin Cancer  Check your skin from head to toe regularly.  Tell your health care provider about any new moles or changes in moles, especially if: ? There is a change in a mole's size, shape, or color. ? You have a  mole that is larger than a pencil eraser.  Always use sunscreen. Apply sunscreen liberally and repeat throughout the day.  Protect yourself by wearing long sleeves, pants, a wide-brimmed hat, and sunglasses when outside.  What should I know about heart disease, diabetes, and high blood pressure?  If you are 70-30 years of age, have your blood pressure checked every 3-5 years. If you are 54 years of age or older, have your blood pressure checked every year. You should have your blood pressure measured twice-once when you are at a hospital or clinic, and once when you are not at a hospital or clinic. Record the average of the two measurements. To check your blood pressure when you are not at a hospital or clinic, you can use: ? An automated blood pressure machine at a pharmacy. ? A home blood pressure monitor.  Talk to your health care provider about your target blood pressure.  If you are between 2-81 years old, ask your health care provider if you should take aspirin to prevent heart disease.  Have regular diabetes screenings by checking your fasting blood sugar level. ? If you are at a normal weight and have a low risk for diabetes, have this test once every three years after the age of 19. ? If you are overweight and have a high risk for diabetes, consider being tested at a younger age or more often.  A one-time screening for abdominal aortic aneurysm (AAA) by ultrasound is recommended for men aged 23-75 years who are current or former smokers. What should I know about preventing infection? Hepatitis B If you have a higher risk for hepatitis B, you should be screened for this virus. Talk with your health care provider to find out if you are at risk for hepatitis B infection. Hepatitis C Blood testing is recommended for:  Everyone born from [birth year] through 1963.  Anyone with known risk factors for hepatitis C.  Sexually Transmitted Diseases (STDs)  You should be screened each year for  STDs including gonorrhea and chlamydia if: ? You are sexually active and are younger than 80 years of age. ? You are older than 80 years of age and your health care provider tells you that you are at risk for this type of infection. ? Your sexual activity has changed since you were last screened and you are at an increased risk for chlamydia or gonorrhea. Ask your health care provider if you are at risk.  Talk with your health care provider about whether you are at high risk of being infected with HIV. Your health care provider may recommend a prescription medicine to help prevent HIV infection.  What else can I do?  Schedule regular health, dental, and eye exams.  Stay current with your vaccines (immunizations).  Do not use any tobacco products, such as cigarettes, chewing tobacco, and e-cigarettes. If you need help quitting,  ask your health care provider.  Limit alcohol intake to no more than 2 drinks per day. One drink equals 12 ounces of beer, 5 ounces of wine, or 1 ounces of hard liquor.  Do not use street drugs.  Do not share needles.  Ask your health care provider for help if you need support or information about quitting drugs.  Tell your health care provider if you often feel depressed.  Tell your health care provider if you have ever been abused or do not feel safe at home. This information is not intended to replace advice given to you by your health care provider. Make sure you discuss any questions you have with your health care provider. Document Released: 03/15/2008 Document Revised: 05/16/2016 Document Reviewed: 06/21/2015 Elsevier Interactive Patient Education  [employer].

## 2018-07-15 NOTE — Progress Notes (Signed)
Reviewed  Yvonne R Lowne Chase, DO  

## 2018-07-15 NOTE — Progress Notes (Signed)
Patient ID: Louis Ivan., male    DOB: Apr 09, 1936  Age: 82 y.o. MRN: [id number]    Subjective:  Subjective  HPI Louis Carsten. presents for f/u dm , chol, htn HYPERTENSION   Blood pressure range-not checking   Chest pain- no      Dyspnea- no Lightheadedness- no   Edema- no  Other side effects - no   Medication compliance: good Low salt diet- yes    DIABETES    Blood Sugar ranges-120s  Polyuria- no New Visual problems- no  Hypoglycemic symptoms- no  Other side effects-no Medication compliance - good Last eye exam- annually Foot exam- today   HYPERLIPIDEMIA  Medication compliance- good RUQ pain- no  Muscle aches- no Other side effects-no  Review of Systems  Constitutional: Negative for chills and fever.  HENT: Negative for congestion and hearing loss.   Eyes: Negative for discharge.  Respiratory: Negative for cough and shortness of breath.   Cardiovascular: Negative for chest pain, palpitations and leg swelling.  Gastrointestinal: Negative for abdominal pain, blood in stool, constipation, diarrhea, nausea and vomiting.  Genitourinary: Negative for dysuria, frequency, hematuria and urgency.  Musculoskeletal: Negative for back pain and myalgias.  Skin: Negative for rash.  Allergic/Immunologic: Negative for environmental allergies.  Neurological: Negative for dizziness, weakness and headaches.  Hematological: Does not bruise/bleed easily.  Psychiatric/Behavioral: Negative for suicidal ideas. The patient is not nervous/anxious.     History Past Medical History:  Diagnosis Date  . Anemia   . Arthritis    Dr Amil Amen, [place] rx- Rheumatology  . CKD (chronic kidney disease)   . Diabetes mellitus    takes 1 pill  . Diverticulosis   . GERD (gastroesophageal reflux disease)   . Hemochromatosis     Dr Henrene Pastor  . History of kidney stones 1980   passed spont.  Marland Kitchen HOH (hard of hearing)    congenital hearing loss  . Hx of adenomatous colonic polyps   .  Hyperlipidemia   . Hypertension   . Hypothyroidism   . IBS (irritable bowel syndrome)   . Internal hemorrhoids   . Kidney stones, calcium oxalate    1985 & 2005  . Pulmonary embolism ([place]) 2015   hosp. with Heparin & then on Xarelto- 3 months or so  . Reflux esophagitis   . SBO (small bowel obstruction) ([place]) 2004  . Spinal stenosis    Dr.Ramos  . Testosterone deficiency    111 in 2007    He has a past surgical history that includes Appendectomy; Colonoscopy w/ polypectomy (2005); lumbar nerve block; Total shoulder replacement; Total knee arthroplasty ( 201 & 2012); Refractive surgery (2005); Tonsillectomy and adenoidectomy; cataract (2005); TM replacement (1981); Middle ear surgery (1980); L4-5 & L5- S1 bilat facet injections (01/20/14); Joint replacement; Lumbar laminectomy with coflex 2 level (N/A, 07/30/2016); Rotator cuff repair; Reverse shoulder arthroplasty (Right, 11/15/2017); and IR SACROPLASTY BILATERAL (05/08/2018).   His family history includes Breast cancer in his maternal grandmother; Depression in his brother; Diabetes in his father and maternal aunt; Heart disease in his paternal aunt and paternal uncle; Heart failure in his mother; Stroke (age of onset: 55) in his father.He reports that he quit smoking about 37 years ago. He has never used smokeless tobacco. He reports that he drinks alcohol. He reports that he does not use drugs.  Current Outpatient Medications on File Prior to Visit  Medication Sig Dispense Refill  . amoxicillin (AMOXIL) 500 MG capsule Take 2,000 mg by mouth See admin  instructions. For Dental Procedures    . atorvastatin (LIPITOR) 20 MG tablet TAKE 1 TABLET DAILY AT 6 P.M. 90 tablet 1  . CALCIUM-VITAMIN D PO Take 1 tablet by mouth daily.     Marland Kitchen ELIQUIS 5 MG TABS tablet TAKE 1 TABLET TWICE A DAY 180 tablet 1  . fenofibrate 160 MG tablet TAKE 1 TABLET(160 MG) BY MOUTH DAILY 90 tablet 0  . fluticasone (FLONASE) 50 MCG/ACT nasal spray Place 2 sprays into both  nostrils daily. (Patient taking differently: Place 2 sprays into both nostrils daily as needed for allergies or rhinitis. ) 16 g 1  . folic acid (FOLVITE) 1 MG tablet Take 1 mg by mouth daily.      Marland Kitchen glucose blood test strip OneTouch Verio strips  U ONCE D UTD    . HYDROcodone-acetaminophen (NORCO) 10-325 MG tablet Take 1 tablet by mouth every 4 (four) hours as needed for moderate pain or severe pain (pain). 30 tablet 0  . hydrOXYzine (ATARAX/VISTARIL) 10 MG tablet 1 tab po q hs  as needed for itching (Patient taking differently: Take 10 mg by mouth at bedtime as needed for itching. 1 tab po q hs  as needed for itching) 5 tablet 0  . levothyroxine (SYNTHROID, LEVOTHROID) 125 MCG tablet TAKE 1 TABLET DAILY 90 tablet 0  . lidocaine (XYLOCAINE) 2 % solution SWISH AND SPIT 5MLS BY MOUTH 4 TIMES DAILY. DO NOT SWALLOW 200 mL 0  . Liniments (BLUE-EMU SUPER STRENGTH EX) Apply 1 application topically daily as needed (pain).    Marland Kitchen lisinopril (PRINIVIL,ZESTRIL) 5 MG tablet 5 mg daily.    . magic mouthwash SOLN 5 ml po qid swish and spit 200 mL 11  . methocarbamol (ROBAXIN) 500 MG tablet Take 1 tablet (500 mg total) by mouth every 6 (six) hours as needed for muscle spasms. 40 tablet 3  . methotrexate (RHEUMATREX) 2.5 MG tablet Take 20 mg by mouth every Monday.     . multivitamin (THERAGRAN) per tablet Take 1 tablet by mouth daily.      . ondansetron (ZOFRAN) 4 MG tablet Take 1 tablet (4 mg total) by mouth every 8 (eight) hours as needed for nausea or vomiting. 20 tablet 0  . ondansetron (ZOFRAN) 4 MG tablet TAKE 1 TABLET EVERY 6 HOURS AS NEEDED FOR NAUSEA OR VOMITING 40 tablet 0  . pantoprazole (PROTONIX) 40 MG tablet TAKE 1 TABLET DAILY 90 tablet 1  . pioglitazone (ACTOS) 15 MG tablet Take 1 tablet (15 mg total) by mouth daily. 90 tablet 1  . polyethylene glycol (MIRALAX) packet Take 17 g by mouth daily as needed for moderate constipation. OTC 30 each 3  . vitamin B-12 (CYANOCOBALAMIN) 1000 MCG tablet Take  1,000 mcg by mouth daily.     No current facility-administered medications on file prior to visit.      Objective:  Objective  Physical Exam  Constitutional: He is oriented to person, place, and time. Vital signs are normal. He appears well-developed and well-nourished. He is sleeping.  HENT:  Head: Normocephalic and atraumatic.  Mouth/Throat: Oropharynx is clear and moist.  Eyes: Pupils are equal, round, and reactive to light. EOM are normal.  Neck: Normal range of motion. Neck supple. No thyromegaly present.  Cardiovascular: Normal rate and regular rhythm.  No murmur heard. Pulmonary/Chest: Effort normal and breath sounds normal. No respiratory distress. He has no wheezes. He has no rales. He exhibits no tenderness.  Musculoskeletal: He exhibits no edema or tenderness.  Neurological: He is  alert and oriented to person, place, and time.  Skin: Skin is warm and dry.  Psychiatric: He has a normal mood and affect. His behavior is normal. Judgment and thought content normal.  Nursing note and vitals reviewed.   Diabetic Foot Exam - Simple   Simple Foot Form Diabetic Foot exam was performed with the following findings:  Yes 07/15/2018  2:56 PM  Visual Inspection No deformities, no ulcerations, no other skin breakdown bilaterally:  Yes Sensation Testing Intact to touch and monofilament testing bilaterally:  Yes Pulse Check Posterior Tibialis and Dorsalis pulse intact bilaterally:  Yes Comments     BP (!) 146/80   Pulse 61   Temp 98.2 F (36.8 C) (Oral)   Resp 16   Ht 6\' 1"  (1.854 m)   Wt 221 lb (100.2 kg)   SpO2 96%   BMI 29.16 kg/m  Wt Readings from Last 3 Encounters:  07/15/18 221 lb (100.2 kg)  07/15/18 221 lb 12.8 oz (100.6 kg)  06/25/18 222 lb 6.4 oz (100.9 kg)     Lab Results  Component Value Date   WBC 6.0 06/25/2018   HGB 10.4 (L) 06/25/2018   HCT 33.7 (L) 06/25/2018   PLT 343 06/25/2018   GLUCOSE 107 (H) 07/15/2018   CHOL 131 07/15/2018   TRIG 112.0  07/15/2018   HDL 43.60 07/15/2018   LDLDIRECT 120.0 12/02/2015   LDLCALC 65 07/15/2018   ALT 11 07/15/2018   AST 15 07/15/2018   NA 140 07/15/2018   K 4.7 07/15/2018   CL 103 07/15/2018   CREATININE 1.85 (H) 07/15/2018   BUN 24 (H) 07/15/2018   CO2 28 07/15/2018   TSH 0.37 05/21/2017   PSA 1.67 10/11/2016   INR 1.08 05/08/2018   HGBA1C 6.7 (H) 07/15/2018   MICROALBUR 3.0 (H) 12/08/2014    Nm Bone Scan 3 Phase  Result Date: 07/11/2018 CLINICAL DATA:  RIGHT knee pain, underwent RIGHT knee replacement surgery in 2009, LEFT knee replacement surgery in 2010 EXAM: NUCLEAR MEDICINE 3-PHASE BONE SCAN TECHNIQUE: Radionuclide angiographic images, immediate static blood pool images, and 3-hour delayed static images were obtained of the knees after intravenous injection of radiopharmaceutical. RADIOPHARMACEUTICALS:  21.4 mCi Tc-66m MDP IV COMPARISON:  None Radiographic correlation: None FINDINGS: Vascular phase: Normal blood flow to the LEFT knee. Minimally increased blood pool to the medial RIGHT knee at the level of the femoral condyle. Blood pool phase: Increased blood pool at the medial aspect of the RIGHT knee at the level of the femoral condyle. Minimally increased tracer localization at the LEFT medial femoral condyle. Delayed phase: Focal increased tracer localization at at the medial femoral condyles bilaterally which may represent aseptic loosening or infection of the knee prostheses. No additional abnormal osseous tracer accumulation identified. IMPRESSION: Increased blood flow, blood pool, and delayed uptake of tracer at the RIGHT medial femoral condyle greater than LEFT lateral femoral condyle, suspicious for aseptic loosening or infection of the knee prostheses bilaterally especially on RIGHT. Electronically Signed   By: Lavonia Dana M.D.   On: 07/11/2018 15:00     Assessment & Plan:  Plan  I have changed Louis Colder Jr.'s ALPRAZolam. I am also having him maintain his  CALCIUM-VITAMIN D PO, folic acid, methotrexate, multivitamin, vitamin B-12, fluticasone, ondansetron, polyethylene glycol, Liniments (BLUE-EMU SUPER STRENGTH EX), HYDROcodone-acetaminophen, methocarbamol, atorvastatin, glucose blood, amoxicillin, ondansetron, hydrOXYzine, pantoprazole, pioglitazone, ELIQUIS, levothyroxine, lidocaine, fenofibrate, magic mouthwash, and lisinopril.  Meds ordered this encounter  Medications  . ALPRAZolam (XANAX) 0.5 MG tablet  Sig: 1 po qhs prn    Dispense:  30 tablet    Refill:  0    Problem List Items Addressed This Visit      Unprioritized   Anxiety - Primary   Relevant Medications   ALPRAZolam (XANAX) 0.5 MG tablet   Controlled type 2 diabetes mellitus with complication, without long-term current use of insulin (HCC)    hgba1c to be checked, minimize simple carbs. Increase exercise as tolerated. Continue current meds       Relevant Orders   Hemoglobin A1c (Completed)   Comprehensive metabolic panel (Completed)   Lipid panel (Completed)   Essential hypertension    Well controlled, no changes to meds. Encouraged heart healthy diet such as the DASH diet and exercise as tolerated.       Hemochromatosis    Per hematology      Hyperlipidemia LDL goal <70    Encouraged heart healthy diet, increase exercise, avoid trans fats, consider a krill oil cap daily      Recurrent pulmonary embolism ([place])    On eliquis        Other Visit Diagnoses    High risk medication use          Follow-up: Return in about 6 months (around 01/14/2019), or if symptoms worsen or fail to improve, for annual exam, fasting.  Ann Held, DO

## 2018-07-16 LAB — LIPID PANEL
CHOL/HDL RATIO: 3
CHOLESTEROL: 131 mg/dL (ref 0–200)
HDL: 43.6 mg/dL (ref >39.00)
LDL CALC: 65 mg/dL (ref 0–99)
NonHDL: 87.32
TRIGLYCERIDES: 112 mg/dL (ref 0.0–149.0)
VLDL: 22.4 mg/dL (ref 0.0–40.0)

## 2018-07-16 LAB — COMPREHENSIVE METABOLIC PANEL
ALT: 11 U/L (ref 0–53)
AST: 15 U/L (ref 0–37)
Albumin: 4.3 g/dL (ref 3.5–5.2)
Alkaline Phosphatase: 68 U/L (ref 39–117)
BILIRUBIN TOTAL: 0.4 mg/dL (ref 0.2–1.2)
BUN: 24 mg/dL — ABNORMAL HIGH (ref 6–23)
CALCIUM: 9.2 mg/dL (ref 8.4–10.5)
CHLORIDE: 103 meq/L (ref 96–112)
CO2: 28 meq/L (ref 19–32)
CREATININE: 1.85 mg/dL — AB (ref 0.40–1.50)
GFR: 37.5 mL/min — AB (ref >60.00)
Glucose, Bld: 107 mg/dL — ABNORMAL HIGH (ref 70–99)
Potassium: 4.7 mEq/L (ref 3.5–5.1)
Sodium: 140 mEq/L (ref 135–145)
Total Protein: 6.5 g/dL (ref 6.0–8.3)

## 2018-07-16 LAB — HEMOGLOBIN A1C: Hgb A1c MFr Bld: 6.7 % — ABNORMAL HIGH (ref 4.6–6.5)

## 2018-07-17 DIAGNOSIS — Z96651 Presence of right artificial knee joint: Secondary | ICD-10-CM | POA: Diagnosis not present

## 2018-07-17 DIAGNOSIS — M25569 Pain in unspecified knee: Secondary | ICD-10-CM | POA: Diagnosis not present

## 2018-07-17 DIAGNOSIS — M25361 Other instability, right knee: Secondary | ICD-10-CM | POA: Diagnosis not present

## 2018-07-17 NOTE — Assessment & Plan Note (Signed)
On eliquis

## 2018-07-17 NOTE — Assessment & Plan Note (Signed)
Encouraged heart healthy diet, increase exercise, avoid trans fats, consider a krill oil cap daily 

## 2018-07-17 NOTE — Assessment & Plan Note (Signed)
Per hematology 

## 2018-07-17 NOTE — Assessment & Plan Note (Signed)
Well controlled, no changes to meds. Encouraged heart healthy diet such as the DASH diet and exercise as tolerated.  °

## 2018-07-17 NOTE — Assessment & Plan Note (Signed)
hgba1c to be checked, minimize simple carbs. Increase exercise as tolerated. Continue current meds  

## 2018-07-24 MED FILL — MAGIC MW LID/MAAL/DP1:1:1: 2 | 10 days supply | Qty: 200 | Fill #2

## 2018-07-25 ENCOUNTER — Encounter: Payer: Self-pay | Admitting: *Deleted

## 2018-08-03 ENCOUNTER — Other Ambulatory Visit: Payer: Self-pay | Admitting: Family Medicine

## 2018-08-07 MED FILL — MAGIC MW LID/MAAL/DP1:1:1: 2 | 10 days supply | Qty: 200 | Fill #3

## 2018-08-10 ENCOUNTER — Other Ambulatory Visit: Payer: Self-pay | Admitting: Family Medicine

## 2018-08-21 MED FILL — MAGIC MW LID/MAAL/DP1:1:1: 2 | 10 days supply | Qty: 200 | Fill #4

## 2018-08-30 ENCOUNTER — Other Ambulatory Visit: Payer: Self-pay | Admitting: Family Medicine

## 2018-08-30 DIAGNOSIS — E785 Hyperlipidemia, unspecified: Secondary | ICD-10-CM

## 2018-08-31 ENCOUNTER — Other Ambulatory Visit: Payer: Self-pay | Admitting: Family Medicine

## 2018-08-31 DIAGNOSIS — E119 Type 2 diabetes mellitus without complications: Secondary | ICD-10-CM

## 2018-09-01 ENCOUNTER — Other Ambulatory Visit: Payer: Self-pay | Admitting: Family Medicine

## 2018-09-01 DIAGNOSIS — F419 Anxiety disorder, unspecified: Secondary | ICD-10-CM

## 2018-09-01 DIAGNOSIS — F411 Generalized anxiety disorder: Secondary | ICD-10-CM

## 2018-09-02 NOTE — Telephone Encounter (Signed)
Last office visit on 07/15/2018 Last refill on 07/15/2018  #30 no refills UDS//done and low risk on 07/15/2018

## 2018-09-03 DIAGNOSIS — M150 Primary generalized (osteo)arthritis: Secondary | ICD-10-CM | POA: Diagnosis not present

## 2018-09-03 DIAGNOSIS — M0609 Rheumatoid arthritis without rheumatoid factor, multiple sites: Secondary | ICD-10-CM | POA: Diagnosis not present

## 2018-09-03 DIAGNOSIS — E669 Obesity, unspecified: Secondary | ICD-10-CM | POA: Diagnosis not present

## 2018-09-03 DIAGNOSIS — Z6830 Body mass index (BMI) 30.0-30.9, adult: Secondary | ICD-10-CM | POA: Diagnosis not present

## 2018-09-03 DIAGNOSIS — M810 Age-related osteoporosis without current pathological fracture: Secondary | ICD-10-CM | POA: Diagnosis not present

## 2018-09-03 DIAGNOSIS — M5136 Other intervertebral disc degeneration, lumbar region: Secondary | ICD-10-CM | POA: Diagnosis not present

## 2018-09-04 DIAGNOSIS — M1611 Unilateral primary osteoarthritis, right hip: Secondary | ICD-10-CM | POA: Diagnosis not present

## 2018-09-04 DIAGNOSIS — M25551 Pain in right hip: Secondary | ICD-10-CM | POA: Diagnosis not present

## 2018-09-04 DIAGNOSIS — M25561 Pain in right knee: Secondary | ICD-10-CM | POA: Diagnosis not present

## 2018-09-05 ENCOUNTER — Other Ambulatory Visit: Payer: Self-pay | Admitting: Family Medicine

## 2018-09-05 DIAGNOSIS — F419 Anxiety disorder, unspecified: Secondary | ICD-10-CM

## 2018-09-05 DIAGNOSIS — F411 Generalized anxiety disorder: Secondary | ICD-10-CM

## 2018-09-05 MED FILL — MAGIC MW LID/MAAL/DP1:1:1: 2 | 10 days supply | Qty: 200 | Fill #5

## 2018-09-05 NOTE — Telephone Encounter (Signed)
Medication request already taking care of on 09/02/18

## 2018-09-16 ENCOUNTER — Other Ambulatory Visit: Payer: Self-pay | Admitting: Family Medicine

## 2018-09-17 DIAGNOSIS — M1611 Unilateral primary osteoarthritis, right hip: Secondary | ICD-10-CM | POA: Diagnosis not present

## 2018-09-19 MED FILL — MAGIC MW LID/MAAL/DP1:1:1: 2 | 10 days supply | Qty: 200 | Fill #6

## 2018-09-28 ENCOUNTER — Other Ambulatory Visit: Payer: Self-pay | Admitting: Family Medicine

## 2018-10-06 MED FILL — MAGIC MW LID/MAAL/DP1:1:1: 2 | 10 days supply | Qty: 200 | Fill #7

## 2018-10-10 DIAGNOSIS — M1611 Unilateral primary osteoarthritis, right hip: Secondary | ICD-10-CM | POA: Diagnosis not present

## 2018-10-10 DIAGNOSIS — M76891 Other specified enthesopathies of right lower limb, excluding foot: Secondary | ICD-10-CM | POA: Diagnosis not present

## 2018-10-17 MED FILL — MAGIC MW LID/MAAL/DP1:1:1: 2 | 10 days supply | Qty: 200 | Fill #8

## 2018-10-23 DIAGNOSIS — M545 Low back pain: Secondary | ICD-10-CM | POA: Diagnosis not present

## 2018-10-23 DIAGNOSIS — M5136 Other intervertebral disc degeneration, lumbar region: Secondary | ICD-10-CM | POA: Diagnosis not present

## 2018-10-23 DIAGNOSIS — Z79891 Long term (current) use of opiate analgesic: Secondary | ICD-10-CM | POA: Diagnosis not present

## 2018-10-23 DIAGNOSIS — G894 Chronic pain syndrome: Secondary | ICD-10-CM | POA: Diagnosis not present

## 2018-10-24 ENCOUNTER — Other Ambulatory Visit: Payer: Medicare Other

## 2018-10-24 ENCOUNTER — Ambulatory Visit: Payer: Medicare Other | Admitting: Family

## 2018-10-27 ENCOUNTER — Inpatient Hospital Stay (HOSPITAL_BASED_OUTPATIENT_CLINIC_OR_DEPARTMENT_OTHER): Payer: Medicare Other | Admitting: Hematology & Oncology

## 2018-10-27 ENCOUNTER — Encounter: Payer: Self-pay | Admitting: Hematology & Oncology

## 2018-10-27 ENCOUNTER — Telehealth: Payer: Self-pay | Admitting: Hematology & Oncology

## 2018-10-27 ENCOUNTER — Inpatient Hospital Stay: Payer: Medicare Other | Attending: Hematology & Oncology

## 2018-10-27 ENCOUNTER — Other Ambulatory Visit: Payer: Self-pay

## 2018-10-27 VITALS — BP 135/52 | HR 80 | Temp 97.8°F | Resp 18 | Wt 219.0 lb

## 2018-10-27 DIAGNOSIS — Z7901 Long term (current) use of anticoagulants: Secondary | ICD-10-CM | POA: Diagnosis not present

## 2018-10-27 DIAGNOSIS — M329 Systemic lupus erythematosus, unspecified: Secondary | ICD-10-CM | POA: Insufficient documentation

## 2018-10-27 DIAGNOSIS — Z79899 Other long term (current) drug therapy: Secondary | ICD-10-CM | POA: Insufficient documentation

## 2018-10-27 DIAGNOSIS — Z86711 Personal history of pulmonary embolism: Secondary | ICD-10-CM

## 2018-10-27 DIAGNOSIS — I10 Essential (primary) hypertension: Secondary | ICD-10-CM | POA: Insufficient documentation

## 2018-10-27 DIAGNOSIS — Z86718 Personal history of other venous thrombosis and embolism: Secondary | ICD-10-CM | POA: Insufficient documentation

## 2018-10-27 DIAGNOSIS — D638 Anemia in other chronic diseases classified elsewhere: Secondary | ICD-10-CM

## 2018-10-27 LAB — CMP (CANCER CENTER ONLY)
ALBUMIN: 4.4 g/dL (ref 3.5–5.0)
ALK PHOS: 62 U/L (ref 38–126)
ALT: 14 U/L (ref 0–44)
ANION GAP: 8 (ref 5–15)
AST: 15 U/L (ref 15–41)
BUN: 19 mg/dL (ref 8–23)
CALCIUM: 9.5 mg/dL (ref 8.9–10.3)
CHLORIDE: 105 mmol/L (ref 98–111)
CO2: 25 mmol/L (ref 22–32)
Creatinine: 1.62 mg/dL — ABNORMAL HIGH (ref 0.61–1.24)
GFR, EST AFRICAN AMERICAN: 45 mL/min — AB (ref >60)
GFR, Estimated: 39 mL/min — ABNORMAL LOW (ref >60)
GLUCOSE: 137 mg/dL — AB (ref 70–99)
POTASSIUM: 3.8 mmol/L (ref 3.5–5.1)
SODIUM: 138 mmol/L (ref 135–145)
Total Bilirubin: 0.4 mg/dL (ref 0.3–1.2)
Total Protein: 6.8 g/dL (ref 6.5–8.1)

## 2018-10-27 LAB — CBC WITH DIFFERENTIAL (CANCER CENTER ONLY)
ABS IMMATURE GRANULOCYTES: 0.02 10*3/uL (ref 0.00–0.07)
Basophils Absolute: 0 10*3/uL (ref 0.0–0.1)
Basophils Relative: 1 %
Eosinophils Absolute: 0.1 10*3/uL (ref 0.0–0.5)
Eosinophils Relative: 2 %
HEMATOCRIT: 33 % — AB (ref 39.0–52.0)
HEMOGLOBIN: 10 g/dL — AB (ref 13.0–17.0)
IMMATURE GRANULOCYTES: 1 %
LYMPHS ABS: 0.9 10*3/uL (ref 0.7–4.0)
LYMPHS PCT: 20 %
MCH: 29.2 pg (ref 26.0–34.0)
MCHC: 30.3 g/dL (ref 30.0–36.0)
MCV: 96.2 fL (ref 80.0–100.0)
MONOS PCT: 12 %
Monocytes Absolute: 0.5 10*3/uL (ref 0.1–1.0)
NEUTROS ABS: 2.8 10*3/uL (ref 1.7–7.7)
NEUTROS PCT: 64 %
NRBC: 0 % (ref 0.0–0.2)
PLATELETS: 329 10*3/uL (ref 150–400)
RBC: 3.43 MIL/uL — ABNORMAL LOW (ref 4.22–5.81)
RDW: 18 % — AB (ref 11.5–15.5)
WBC Count: 4.3 10*3/uL (ref 4.0–10.5)

## 2018-10-27 MED ORDER — APIXABAN 2.5 MG PO TABS: 2.5000 mg | ORAL_TABLET | Freq: Two times a day (BID) | ORAL | 3 refills | Status: AC

## 2018-10-27 NOTE — Telephone Encounter (Signed)
Appointments scheduled avs/calendar printed per 1/27 los

## 2018-10-27 NOTE — Progress Notes (Signed)
Hematology and Oncology Follow Up Visit  Louis [name] [id number] [birth year] 83 y.o. 10/27/2018   Principle Diagnosis:   Recurrent pulmonary embolism - ? (+) lupus anticouagulant  Hemochromatosis - Homozygous for C282Y  Current Therapy:    Eliquis 5 mg po bid - lifelong  Phlebotomy to keep ferritin <100     Interim History:  Mr. Roker is back for follow-up.  He is doing okay.  He really has not had any problems since we last saw him back in September.  He does have some issues with bleeding.  I think that we can probably decrease his Eliquis down to 2.5 mg p.o. twice daily.  I think that would be a reasonable dose for him.  He does have other health issues.  He does have hypothyroidism.  He does have hypertension.  We last saw him in September, there is no lupus anticoagulant detected in his blood.  I am happy about this.  His last CT angiogram that was done back in June 2019 showed improved pulmonary emboli.  I suspect that he probably has chronic nonocclusive pulmonary emboli.  He is having a lot of problems with his right leg.  It sounds like he may need to have surgery for this.  He has had a Doppler of his right leg and back in June 2019.  There is no acute DVT.  He had a chronic nonocclusive DVT in the right popliteal vein extending into involve the tibioperoneal trunk.  Is good  As far as his hemochromatosis is concerned, this is not a problem for [place].  His last ferritin back in September was 19.  His iron saturation was 29%.  Overall, his performance status is ECOG 1.    Medications:  Current Outpatient Medications:  .  ALPRAZolam (XANAX) 0.5 MG tablet, TAKE 1 TABLET BY MOUTH EVERY NIGHT AT BEDTIME AS NEEDED, Disp: 30 tablet, Rfl: 0 .  amoxicillin (AMOXIL) 500 MG capsule, Take 2,000 mg by mouth See admin instructions. For Dental Procedures, Disp: , Rfl:  .  atorvastatin (LIPITOR) 20 MG tablet, Take 1 tablet (20 mg total) by mouth daily at 6 PM., Disp: 90  tablet, Rfl: 1 .  CALCIUM-VITAMIN D PO, Take 1 tablet by mouth daily. , Disp: , Rfl:  .  ELIQUIS 5 MG TABS tablet, TAKE 1 TABLET TWICE A DAY, Disp: 180 tablet, Rfl: 1 .  fenofibrate 160 MG tablet, TAKE 1 TABLET(160 MG) BY MOUTH DAILY, Disp: 90 tablet, Rfl: 0 .  fluticasone (FLONASE) 50 MCG/ACT nasal spray, Place 2 sprays into both nostrils daily. (Patient taking differently: Place 2 sprays into both nostrils daily as needed for allergies or rhinitis. ), Disp: 16 g, Rfl: 1 .  folic acid (FOLVITE) 1 MG tablet, Take 1 mg by mouth daily.  , Disp: , Rfl:  .  glucose blood test strip, OneTouch Verio strips  U ONCE D UTD, Disp: , Rfl:  .  HYDROcodone-acetaminophen (NORCO) 10-325 MG tablet, Take 1 tablet by mouth every 4 (four) hours as needed for moderate pain or severe pain (pain)., Disp: 30 tablet, Rfl: 0 .  hydrOXYzine (ATARAX/VISTARIL) 10 MG tablet, 1 tab po q hs  as needed for itching (Patient taking differently: Take 10 mg by mouth at bedtime as needed for itching. 1 tab po q hs  as needed for itching), Disp: 5 tablet, Rfl: 0 .  levothyroxine (SYNTHROID, LEVOTHROID) 125 MCG tablet, TAKE 1 TABLET DAILY, Disp: 90 tablet, Rfl: 1 .  lidocaine (XYLOCAINE) 2 %  solution, SWISH AND SPIT 5MLS BY MOUTH 4 TIMES DAILY. DO NOT SWALLOW, Disp: 200 mL, Rfl: 0 .  Liniments (BLUE-EMU SUPER STRENGTH EX), Apply 1 application topically daily as needed (pain)., Disp: , Rfl:  .  lisinopril (PRINIVIL,ZESTRIL) 5 MG tablet, 5 mg daily., Disp: , Rfl:  .  magic mouthwash SOLN, 5 ml po qid swish and spit, Disp: 200 mL, Rfl: 11 .  methocarbamol (ROBAXIN) 500 MG tablet, Take 1 tablet (500 mg total) by mouth every 6 (six) hours as needed for muscle spasms., Disp: 40 tablet, Rfl: 3 .  methotrexate (RHEUMATREX) 2.5 MG tablet, Take 20 mg by mouth every Monday. , Disp: , Rfl:  .  multivitamin (THERAGRAN) per tablet, Take 1 tablet by mouth daily.  , Disp: , Rfl:  .  ondansetron (ZOFRAN) 4 MG tablet, Take 1 tablet (4 mg total) by mouth  every 8 (eight) hours as needed for nausea or vomiting., Disp: 20 tablet, Rfl: 0 .  ondansetron (ZOFRAN) 4 MG tablet, TAKE 1 TABLET EVERY 6 HOURS AS NEEDED FOR NAUSEA OR VOMITING, Disp: 40 tablet, Rfl: 0 .  pantoprazole (PROTONIX) 40 MG tablet, TAKE 1 TABLET DAILY, Disp: 90 tablet, Rfl: 4 .  pioglitazone (ACTOS) 15 MG tablet, TAKE 1 TABLET DAILY, Disp: 90 tablet, Rfl: 4 .  polyethylene glycol (MIRALAX) packet, Take 17 g by mouth daily as needed for moderate constipation. OTC, Disp: 30 each, Rfl: 3 .  vitamin B-12 (CYANOCOBALAMIN) 1000 MCG tablet, Take 1,000 mcg by mouth daily., Disp: , Rfl:   Allergies:  Allergies  Allergen Reactions  . Shellfish Allergy Anaphylaxis, Hives and Swelling    ANGIOEDEMA  . Keflex [Cephalexin] Other (See Comments)     Potential steven johnson syndrome  . Garlic Nausea And Vomiting    And onions  . Iron Other (See Comments) and Hives    HEMACHROMATOSIS HEMACHROMATOSIS    Past Medical History, Surgical history, Social history, and Family History were reviewed and updated.  Review of Systems: Review of Systems  Constitutional: Negative for appetite change, fatigue, fever and unexpected weight change.  HENT:   Negative for lump/mass, mouth sores, sore throat and trouble swallowing.   Respiratory: Negative for cough, hemoptysis and shortness of breath.   Cardiovascular: Negative for leg swelling and palpitations.  Gastrointestinal: Negative for abdominal distention, abdominal pain, blood in stool, constipation, diarrhea, nausea and vomiting.  Genitourinary: Negative for bladder incontinence, dysuria, frequency and hematuria.   Musculoskeletal: Negative for arthralgias, back pain, gait problem and myalgias.  Skin: Negative for itching and rash.  Neurological: Negative for dizziness, extremity weakness, gait problem, headaches, numbness, seizures and speech difficulty.  Hematological: Does not bruise/bleed easily.  Psychiatric/Behavioral: Negative for  depression and sleep disturbance. The patient is not nervous/anxious.     Physical Exam:  weight is 219 lb (99.3 kg). His oral temperature is 97.8 F (36.6 C). His blood pressure is 135/52 (abnormal) and his pulse is 80. His respiration is 18 and oxygen saturation is 100%.   Wt Readings from Last 3 Encounters:  10/27/18 219 lb (99.3 kg)  07/15/18 221 lb (100.2 kg)  07/15/18 221 lb 12.8 oz (100.6 kg)    Physical Exam Vitals signs reviewed.  HENT:     Head: Normocephalic and atraumatic.  Eyes:     Pupils: Pupils are equal, round, and reactive to light.  Neck:     Musculoskeletal: Normal range of motion.  Cardiovascular:     Rate and Rhythm: Normal rate and regular rhythm.  Heart sounds: Normal heart sounds.  Pulmonary:     Effort: Pulmonary effort is normal.     Breath sounds: Normal breath sounds.  Abdominal:     General: Bowel sounds are normal.     Palpations: Abdomen is soft.  Musculoskeletal: Normal range of motion.        General: No tenderness or deformity.  Lymphadenopathy:     Cervical: No cervical adenopathy.  Skin:    General: Skin is warm and dry.     Findings: No erythema or rash.  Neurological:     Mental Status: He is alert and oriented to person, place, and time.  Psychiatric:        Behavior: Behavior normal.        Thought Content: Thought content normal.        Judgment: Judgment normal.      Lab Results  Component Value Date   WBC 4.3 10/27/2018   HGB 10.0 (L) 10/27/2018   HCT 33.0 (L) 10/27/2018   MCV 96.2 10/27/2018   PLT 329 10/27/2018     Chemistry      Component Value Date/Time   NA 138 10/27/2018 1306   NA 148 (H) 09/16/2017 1051   K 3.8 10/27/2018 1306   K 4.6 09/16/2017 1051   CL 105 10/27/2018 1306   CL 107 09/16/2017 1051   CO2 25 10/27/2018 1306   CO2 29 09/16/2017 1051   BUN 19 10/27/2018 1306   BUN 20 09/16/2017 1051   CREATININE 1.62 (H) 10/27/2018 1306   CREATININE 1.7 (H) 09/16/2017 1051      Component Value  Date/Time   CALCIUM 9.5 10/27/2018 1306   CALCIUM 10.1 09/16/2017 1051   ALKPHOS 62 10/27/2018 1306   ALKPHOS 62 09/16/2017 1051   AST 15 10/27/2018 1306   ALT 14 10/27/2018 1306   ALT 29 09/16/2017 1051   BILITOT 0.4 10/27/2018 1306         Impression and Plan: Mr. Knoop is an 83 year old white male.  He has had past thromboembolic events.  He has had past pulmonary emboli.  Again, we are going to drop the Eliquis dose down to 2.5 mg p.o. twice daily.  I think this would be reasonable.  He might need to have another vascular ultrasound of his right leg when we see him back.  I think this might be necessary.  I do not think that we need to do any type of CT angiogram on him.  I will see him back in 3 months.     Volanda Napoleon, MD 1/27/20202:35 PM

## 2018-10-28 LAB — IRON AND TIBC
Iron: 31 ug/dL — ABNORMAL LOW (ref 42–163)
SATURATION RATIOS: 10 % — AB (ref 20–55)
TIBC: 314 ug/dL (ref 202–409)
UIBC: 283 ug/dL (ref 117–376)

## 2018-10-28 LAB — ERYTHROPOIETIN: Erythropoietin: 16.2 m[IU]/mL (ref 2.6–18.5)

## 2018-10-28 LAB — FERRITIN: Ferritin: 17 ng/mL — ABNORMAL LOW (ref 24–336)

## 2018-10-30 MED FILL — MAGIC MW LID/MAAL/DP1:1:1: 2 | 10 days supply | Qty: 200 | Fill #9

## 2018-11-06 DIAGNOSIS — Z471 Aftercare following joint replacement surgery: Secondary | ICD-10-CM | POA: Diagnosis not present

## 2018-11-06 DIAGNOSIS — Z96611 Presence of right artificial shoulder joint: Secondary | ICD-10-CM | POA: Diagnosis not present

## 2018-11-20 ENCOUNTER — Other Ambulatory Visit (HOSPITAL_BASED_OUTPATIENT_CLINIC_OR_DEPARTMENT_OTHER): Payer: Self-pay | Admitting: Physician Assistant

## 2018-11-20 DIAGNOSIS — Z96611 Presence of right artificial shoulder joint: Secondary | ICD-10-CM

## 2018-11-20 DIAGNOSIS — Z471 Aftercare following joint replacement surgery: Secondary | ICD-10-CM | POA: Diagnosis not present

## 2018-11-21 DIAGNOSIS — M76891 Other specified enthesopathies of right lower limb, excluding foot: Secondary | ICD-10-CM | POA: Diagnosis not present

## 2018-11-21 DIAGNOSIS — M1611 Unilateral primary osteoarthritis, right hip: Secondary | ICD-10-CM | POA: Diagnosis not present

## 2018-11-21 DIAGNOSIS — M7061 Trochanteric bursitis, right hip: Secondary | ICD-10-CM | POA: Diagnosis not present

## 2018-11-24 ENCOUNTER — Ambulatory Visit (HOSPITAL_BASED_OUTPATIENT_CLINIC_OR_DEPARTMENT_OTHER)
Admission: RE | Admit: 2018-11-24 | Discharge: 2018-11-24 | Disposition: A | Discharge status: Home / Self Care | Payer: Medicare Other | Source: Ambulatory Visit | Attending: Physician Assistant | Admitting: Physician Assistant

## 2018-11-24 DIAGNOSIS — Z96611 Presence of right artificial shoulder joint: Secondary | ICD-10-CM | POA: Diagnosis not present

## 2018-11-24 DIAGNOSIS — S40011A Contusion of right shoulder, initial encounter: Secondary | ICD-10-CM | POA: Diagnosis not present

## 2018-11-24 MED FILL — MAGIC MW LID/MAAL/DP1:1:1: 2 | 10 days supply | Qty: 200 | Fill #10

## 2018-11-25 DIAGNOSIS — H903 Sensorineural hearing loss, bilateral: Secondary | ICD-10-CM | POA: Diagnosis not present

## 2018-11-27 DIAGNOSIS — Z96611 Presence of right artificial shoulder joint: Secondary | ICD-10-CM | POA: Diagnosis not present

## 2018-11-27 DIAGNOSIS — Z471 Aftercare following joint replacement surgery: Secondary | ICD-10-CM | POA: Diagnosis not present

## 2018-12-08 ENCOUNTER — Other Ambulatory Visit: Payer: Self-pay | Admitting: Family Medicine

## 2018-12-08 ENCOUNTER — Telehealth: Payer: Self-pay | Admitting: Hematology & Oncology

## 2018-12-08 DIAGNOSIS — F419 Anxiety disorder, unspecified: Secondary | ICD-10-CM

## 2018-12-08 DIAGNOSIS — F411 Generalized anxiety disorder: Secondary | ICD-10-CM

## 2018-12-08 NOTE — Telephone Encounter (Signed)
Copied from [place] [id number]. Topic: Quick Communication - Rx Refill/Question >> Dec 08, 2018  2:19 PM Leward Quan A wrote: Medication: ALPRAZolam Duanne Moron) 0.5 MG tablet   Has the patient contacted their pharmacy? Yes.   (Agent: If no, request that the patient contact the pharmacy for the refill.) (Agent: If yes, when and what did the pharmacy advise?)  Preferred Pharmacy (with phone number or street name): [id number], [place] [phone] (Phone) [phone] (Fax)    Agent: Please be advised that RX refills may take up to 3 business days. We ask that you follow-up with your pharmacy.

## 2018-12-08 NOTE — Telephone Encounter (Signed)
Appointments r/s as requested by patient per 3/9 VM

## 2018-12-10 ENCOUNTER — Other Ambulatory Visit: Payer: Self-pay | Admitting: Family Medicine

## 2018-12-10 DIAGNOSIS — F411 Generalized anxiety disorder: Secondary | ICD-10-CM

## 2018-12-10 DIAGNOSIS — F419 Anxiety disorder, unspecified: Secondary | ICD-10-CM

## 2018-12-10 NOTE — Telephone Encounter (Signed)
Requesting:Alprazolam Contract: Yes UDS: Yes; low risk Last OV: 07/15/2018 Next OV: 01/15/2019 Last Refill: 09/02/2018, #30-0 RF Database:   Please advise

## 2018-12-19 MED FILL — MAGIC MW LID/MAAL/DP1:1:1: 2 | 10 days supply | Qty: 200 | Fill #11

## 2019-01-13 DIAGNOSIS — M545 Low back pain: Secondary | ICD-10-CM | POA: Diagnosis not present

## 2019-01-14 MED FILL — MAGIC MW LID/MAAL/DP1:1:1: 2 | 10 days supply | Qty: 200 | Fill #0

## 2019-01-15 ENCOUNTER — Encounter: Payer: Medicare Other | Admitting: Family Medicine

## 2019-01-28 ENCOUNTER — Telehealth: Payer: Self-pay | Admitting: *Deleted

## 2019-01-28 ENCOUNTER — Other Ambulatory Visit: Payer: Self-pay | Admitting: Family Medicine

## 2019-01-28 ENCOUNTER — Other Ambulatory Visit: Payer: Medicare Other

## 2019-01-28 ENCOUNTER — Ambulatory Visit: Payer: Medicare Other | Admitting: Hematology & Oncology

## 2019-01-28 DIAGNOSIS — F419 Anxiety disorder, unspecified: Secondary | ICD-10-CM

## 2019-01-28 DIAGNOSIS — F411 Generalized anxiety disorder: Secondary | ICD-10-CM

## 2019-01-28 MED ORDER — ALPRAZOLAM 0.5 MG PO TABS: 0.5000 mg | ORAL_TABLET | Freq: Every evening | ORAL | 1 refills | Status: DC | PRN

## 2019-01-28 NOTE — Telephone Encounter (Signed)
For

## 2019-01-28 NOTE — Telephone Encounter (Signed)
sent 

## 2019-01-28 NOTE — Telephone Encounter (Signed)
Last written: 12/11/18 Last ov: 07/15/18 Next ov: due (left message to call back to schedule virtual visit) Contract: 07/16/19 UDS: 07/16/19

## 2019-01-28 NOTE — Telephone Encounter (Signed)
Alprazolam

## 2019-01-28 NOTE — Telephone Encounter (Signed)
Left message on machine to call back to schedule a virtual follow up visit for his 6 month follow up,

## 2019-02-03 DIAGNOSIS — M5417 Radiculopathy, lumbosacral region: Secondary | ICD-10-CM | POA: Diagnosis not present

## 2019-02-03 IMAGING — NM NM BONE 3 PHASE
9 series · 19 of 19 positions shown · non-contrast
Comparison: None

Radiographic correlation: None

CLINICAL DATA: RIGHT knee pain, underwent RIGHT knee replacement
surgery in 4113, LEFT knee replacement surgery in 4161

EXAM:
NUCLEAR MEDICINE 3-PHASE BONE SCAN
TECHNIQUE: Radionuclide angiographic images, immediate static blood pool
images, and 3-hour delayed static images were obtained of the knees
after intravenous injection of radiopharmaceutical.
RADIOPHARMACEUTICALS:  21.4 mCi Sc-PPm MDP IV

[Series 1: flow · 2.07mm/px · 6 of 48 frames shown (1 of 2)]
[frame 5/48]
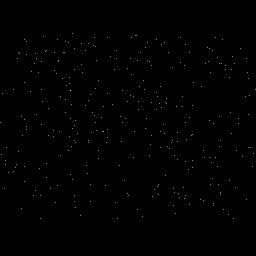
[frame 13/48]
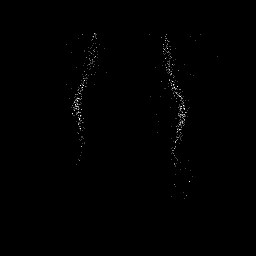
[frame 21/48  full-range]
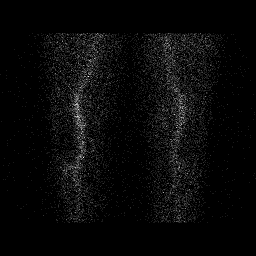
[frame 29/48  full-range]
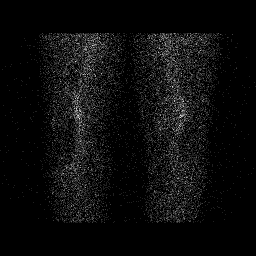
[frame 37/48  full-range]
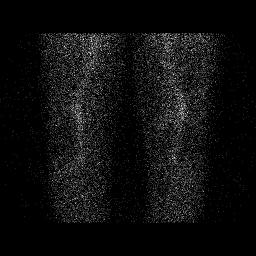
[frame 45/48  full-range]
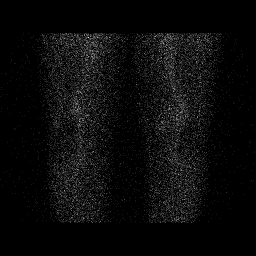

[Series 1: flow · 2.07mm/px · 6 of 48 frames shown (2 of 2)]
[frame 5/48]
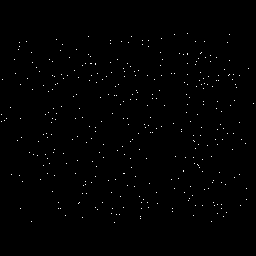
[frame 13/48]
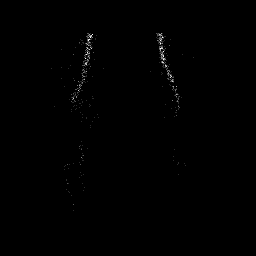
[frame 21/48  full-range]
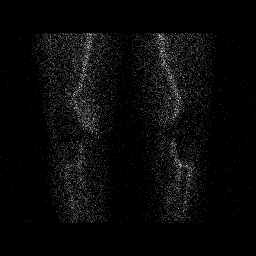
[frame 29/48  full-range]
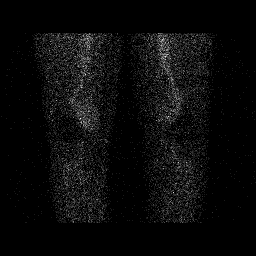
[frame 37/48  full-range]
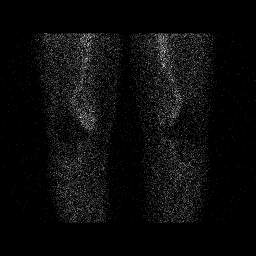
[frame 45/48  full-range]
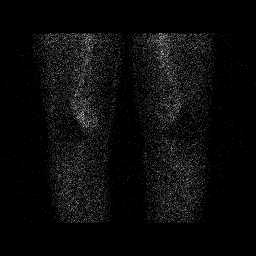

[Series 2: blood pool · 2.07mm/px · 1 of 1 slices shown (1 of 2)]
[im 1/1  full-range]
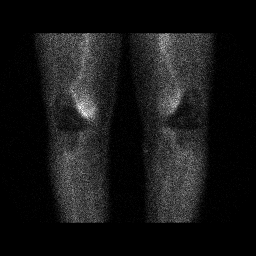

[Series 2: blood pool · 2.07mm/px · 1 of 1 slices shown (2 of 2)]
[im 1/1  full-range]
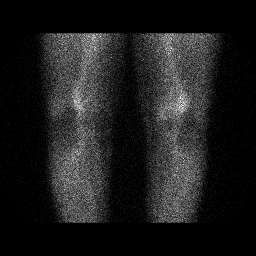

[Series 3: lat bp · 2.07mm/px · 1 of 1 slices shown (1 of 2)]
[im 1/1  full-range]
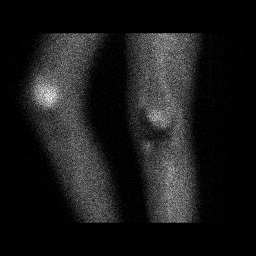

[Series 3: lat bp · 2.07mm/px · 1 of 1 slices shown (2 of 2)]
[im 1/1  full-range]
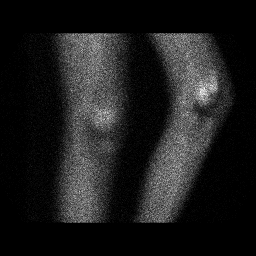

[Series 4: delay · delayed · 2.07mm/px · 1 of 1 slices shown (1 of 3)]
[im 1/1]
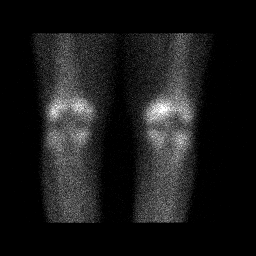

[Series 4: delay · delayed · 2.07mm/px · 1 of 1 slices shown (2 of 3)]
[im 1/1]
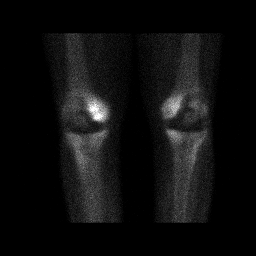

[Series 5: delay · delayed · 2.07mm/px · 1 of 1 slices shown (3 of 3)]
[im 1/1]
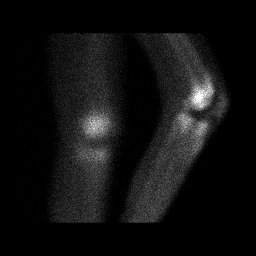

[19 of 19 positions shown; findings below may reference images not displayed]

FINDINGS: Vascular phase: Normal blood flow to the LEFT knee. Minimally
increased blood pool to the medial RIGHT knee at the level of the
femoral condyle.

Blood pool phase: Increased blood pool at the medial aspect of the
RIGHT knee at the level of the femoral condyle. Minimally increased
tracer localization at the LEFT medial femoral condyle.

Delayed phase: Focal increased tracer localization at at the medial
femoral condyles bilaterally which may represent aseptic loosening
or infection of the knee prostheses. No additional abnormal osseous
tracer accumulation identified.
IMPRESSION: Increased blood flow, blood pool, and delayed uptake of tracer at
the RIGHT medial femoral condyle greater than LEFT lateral femoral
condyle, suspicious for aseptic loosening or infection of the knee
prostheses bilaterally especially on RIGHT.

## 2019-02-03 MED FILL — MAGIC MW LID/MAAL/DP1:1:1: 2 | 10 days supply | Qty: 200 | Fill #1

## 2019-02-06 ENCOUNTER — Inpatient Hospital Stay (HOSPITAL_BASED_OUTPATIENT_CLINIC_OR_DEPARTMENT_OTHER): Payer: Medicare Other | Admitting: Hematology & Oncology

## 2019-02-06 ENCOUNTER — Other Ambulatory Visit: Payer: Self-pay

## 2019-02-06 ENCOUNTER — Encounter: Payer: Self-pay | Admitting: Hematology & Oncology

## 2019-02-06 ENCOUNTER — Other Ambulatory Visit: Payer: Self-pay | Admitting: Family Medicine

## 2019-02-06 ENCOUNTER — Inpatient Hospital Stay: Payer: Medicare Other | Attending: Hematology & Oncology

## 2019-02-06 VITALS — BP 151/76 | HR 103 | Resp 19 | Ht 72.0 in | Wt 217.8 lb

## 2019-02-06 DIAGNOSIS — I2699 Other pulmonary embolism without acute cor pulmonale: Secondary | ICD-10-CM | POA: Insufficient documentation

## 2019-02-06 DIAGNOSIS — Z79899 Other long term (current) drug therapy: Secondary | ICD-10-CM | POA: Insufficient documentation

## 2019-02-06 DIAGNOSIS — D649 Anemia, unspecified: Secondary | ICD-10-CM | POA: Diagnosis not present

## 2019-02-06 DIAGNOSIS — D638 Anemia in other chronic diseases classified elsewhere: Secondary | ICD-10-CM

## 2019-02-06 DIAGNOSIS — M329 Systemic lupus erythematosus, unspecified: Secondary | ICD-10-CM

## 2019-02-06 DIAGNOSIS — Z7901 Long term (current) use of anticoagulants: Secondary | ICD-10-CM

## 2019-02-06 DIAGNOSIS — Z86711 Personal history of pulmonary embolism: Secondary | ICD-10-CM

## 2019-02-06 LAB — CBC WITH DIFFERENTIAL (CANCER CENTER ONLY)
Abs Immature Granulocytes: 0.01 10*3/uL (ref 0.00–0.07)
Basophils Absolute: 0 10*3/uL (ref 0.0–0.1)
Basophils Relative: 1 %
Eosinophils Absolute: 0.1 10*3/uL (ref 0.0–0.5)
Eosinophils Relative: 3 %
HCT: 31.3 % — ABNORMAL LOW (ref 39.0–52.0)
Hemoglobin: 9.7 g/dL — ABNORMAL LOW (ref 13.0–17.0)
Immature Granulocytes: 0 %
Lymphocytes Relative: 20 %
Lymphs Abs: 0.8 10*3/uL (ref 0.7–4.0)
MCH: 29.7 pg (ref 26.0–34.0)
MCHC: 31 g/dL (ref 30.0–36.0)
MCV: 95.7 fL (ref 80.0–100.0)
Monocytes Absolute: 0.1 10*3/uL (ref 0.1–1.0)
Monocytes Relative: 3 %
Neutro Abs: 3 10*3/uL (ref 1.7–7.7)
Neutrophils Relative %: 73 %
Platelet Count: 373 10*3/uL (ref 150–400)
RBC: 3.27 MIL/uL — ABNORMAL LOW (ref 4.22–5.81)
RDW: 17.2 % — ABNORMAL HIGH (ref 11.5–15.5)
WBC Count: 4.1 10*3/uL (ref 4.0–10.5)
nRBC: 0 % (ref 0.0–0.2)

## 2019-02-06 LAB — CMP (CANCER CENTER ONLY)
ALT: 16 U/L (ref 0–44)
AST: 19 U/L (ref 15–41)
Albumin: 4.2 g/dL (ref 3.5–5.0)
Alkaline Phosphatase: 55 U/L (ref 38–126)
Anion gap: 8 (ref 5–15)
BUN: 19 mg/dL (ref 8–23)
CO2: 27 mmol/L (ref 22–32)
Calcium: 9.3 mg/dL (ref 8.9–10.3)
Chloride: 105 mmol/L (ref 98–111)
Creatinine: 1.59 mg/dL — ABNORMAL HIGH (ref 0.61–1.24)
GFR, Est AFR Am: 46 mL/min — ABNORMAL LOW (ref >60)
GFR, Estimated: 40 mL/min — ABNORMAL LOW (ref >60)
Glucose, Bld: 152 mg/dL — ABNORMAL HIGH (ref 70–99)
Potassium: 3.8 mmol/L (ref 3.5–5.1)
Sodium: 140 mmol/L (ref 135–145)
Total Bilirubin: 0.5 mg/dL (ref 0.3–1.2)
Total Protein: 6.4 g/dL — ABNORMAL LOW (ref 6.5–8.1)

## 2019-02-06 LAB — RETICULOCYTES
Immature Retic Fract: 8.8 % (ref 2.3–15.9)
RBC.: 3.31 MIL/uL — ABNORMAL LOW (ref 4.22–5.81)
Retic Count, Absolute: 20.2 10*3/uL (ref 19.0–186.0)
Retic Ct Pct: 0.6 % (ref 0.4–3.1)

## 2019-02-06 NOTE — Progress Notes (Signed)
Hematology and Oncology Follow Up Visit  Louis [name] [id number] 10/24/1935 83 y.o. 02/06/2019   Principle Diagnosis:   Recurrent pulmonary embolism - ? (+) lupus anticouagulant  Hemochromatosis - Homozygous for C282Y  Current Therapy:    Eliquis 2.5 mg po bid - lifelong  Phlebotomy to keep ferritin <100     Interim History:  Louis [name] is back for follow-up.  He is doing relatively well.  He has a rolling walker right now.  He needs to have some injections into his back.  Not sure what this will be done.  I told him to stop the Eliquis 2 days before the injections.  I told him that typically the doctor who does the injections will send off is noticed.  He also has a hemochromatosis.  This is not been an issue for [place].  Dr. we last saw him in January, his ferritin was only 17 with an iron saturation of 10%.  He is more anemic.  He has mild renal insufficiency.  His erythropoietin level 16 so this is going to have to be watched closely.  He has lot of swelling in his legs.  I told him that he really needs to wear his compression socks.  This will try to help with the swelling.  Unfortunately, his wife cannot be with [place] today because of the coronavirus restrictions.    Overall, his performance status is ECOG 1.    Medications:  Current Outpatient Medications:  .  ALPRAZolam (XANAX) 0.5 MG tablet, Take 1 tablet (0.5 mg total) by mouth at bedtime as needed., Disp: 30 tablet, Rfl: 1 .  amoxicillin (AMOXIL) 500 MG capsule, Take 2,000 mg by mouth See admin instructions. For Dental Procedures, Disp: , Rfl:  .  apixaban (ELIQUIS) 2.5 MG TABS tablet, Take 1 tablet (2.5 mg total) by mouth 2 (two) times daily., Disp: 180 tablet, Rfl: 3 .  atorvastatin (LIPITOR) 20 MG tablet, Take 1 tablet (20 mg total) by mouth daily at 6 PM., Disp: 90 tablet, Rfl: 1 .  CALCIUM-VITAMIN D PO, Take 1 tablet by mouth daily. , Disp: , Rfl:  .  fenofibrate 160 MG tablet, TAKE 1 TABLET(160 MG) BY MOUTH  DAILY, Disp: 90 tablet, Rfl: 0 .  fluticasone (FLONASE) 50 MCG/ACT nasal spray, Place 2 sprays into both nostrils daily. (Patient taking differently: Place 2 sprays into both nostrils daily as needed for allergies or rhinitis. ), Disp: 16 g, Rfl: 1 .  folic acid (FOLVITE) 1 MG tablet, Take 1 mg by mouth daily.  , Disp: , Rfl:  .  glucose blood test strip, OneTouch Verio strips  U ONCE D UTD, Disp: , Rfl:  .  HYDROcodone-acetaminophen (NORCO) 10-325 MG tablet, Take 1 tablet by mouth every 4 (four) hours as needed for moderate pain or severe pain (pain)., Disp: 30 tablet, Rfl: 0 .  hydrOXYzine (ATARAX/VISTARIL) 10 MG tablet, 1 tab po q hs  as needed for itching (Patient taking differently: Take 10 mg by mouth at bedtime as needed for itching. 1 tab po q hs  as needed for itching), Disp: 5 tablet, Rfl: 0 .  levothyroxine (SYNTHROID) 125 MCG tablet, TAKE 1 TABLET DAILY, Disp: 30 tablet, Rfl: 0 .  lidocaine (XYLOCAINE) 2 % solution, SWISH AND SPIT 5MLS BY MOUTH 4 TIMES DAILY. DO NOT SWALLOW, Disp: 200 mL, Rfl: 0 .  Liniments (BLUE-EMU SUPER STRENGTH EX), Apply 1 application topically daily as needed (pain)., Disp: , Rfl:  .  lisinopril (PRINIVIL,ZESTRIL) 5 MG  tablet, 5 mg daily., Disp: , Rfl:  .  magic mouthwash SOLN, 5 ml po qid swish and spit, Disp: 200 mL, Rfl: 11 .  methocarbamol (ROBAXIN) 500 MG tablet, Take 1 tablet (500 mg total) by mouth every 6 (six) hours as needed for muscle spasms., Disp: 40 tablet, Rfl: 3 .  methotrexate (RHEUMATREX) 2.5 MG tablet, Take 20 mg by mouth every Monday. , Disp: , Rfl:  .  multivitamin (THERAGRAN) per tablet, Take 1 tablet by mouth daily.  , Disp: , Rfl:  .  ondansetron (ZOFRAN) 4 MG tablet, Take 1 tablet (4 mg total) by mouth every 8 (eight) hours as needed for nausea or vomiting., Disp: 20 tablet, Rfl: 0 .  ondansetron (ZOFRAN) 4 MG tablet, TAKE 1 TABLET EVERY 6 HOURS AS NEEDED FOR NAUSEA OR VOMITING, Disp: 40 tablet, Rfl: 0 .  pantoprazole (PROTONIX) 40 MG  tablet, TAKE 1 TABLET DAILY, Disp: 90 tablet, Rfl: 4 .  pioglitazone (ACTOS) 15 MG tablet, TAKE 1 TABLET DAILY, Disp: 90 tablet, Rfl: 4 .  polyethylene glycol (MIRALAX) packet, Take 17 g by mouth daily as needed for moderate constipation. OTC, Disp: 30 each, Rfl: 3 .  vitamin B-12 (CYANOCOBALAMIN) 1000 MCG tablet, Take 1,000 mcg by mouth daily., Disp: , Rfl:   Allergies:  Allergies  Allergen Reactions  . Shellfish Allergy Anaphylaxis, Hives and Swelling    ANGIOEDEMA  . Keflex [Cephalexin] Other (See Comments)     Potential steven johnson syndrome  . Garlic Nausea And Vomiting    And onions  . Iron Other (See Comments) and Hives    HEMACHROMATOSIS HEMACHROMATOSIS    Past Medical History, Surgical history, Social history, and Family History were reviewed and updated.  Review of Systems: Review of Systems  Constitutional: Negative for appetite change, fatigue, fever and unexpected weight change.  HENT:   Negative for lump/mass, mouth sores, sore throat and trouble swallowing.   Respiratory: Negative for cough, hemoptysis and shortness of breath.   Cardiovascular: Negative for leg swelling and palpitations.  Gastrointestinal: Negative for abdominal distention, abdominal pain, blood in stool, constipation, diarrhea, nausea and vomiting.  Genitourinary: Negative for bladder incontinence, dysuria, frequency and hematuria.   Musculoskeletal: Negative for arthralgias, back pain, gait problem and myalgias.  Skin: Negative for itching and rash.  Neurological: Negative for dizziness, extremity weakness, gait problem, headaches, numbness, seizures and speech difficulty.  Hematological: Does not bruise/bleed easily.  Psychiatric/Behavioral: Negative for depression and sleep disturbance. The patient is not nervous/anxious.     Physical Exam:  blood pressure is 151/76 (abnormal) and his pulse is 103 (abnormal). His respiration is 19 and oxygen saturation is 99%.   Wt Readings from Last 3  Encounters:  10/27/18 219 lb (99.3 kg)  07/15/18 221 lb (100.2 kg)  07/15/18 221 lb 12.8 oz (100.6 kg)    Physical Exam Vitals signs reviewed.  HENT:     Head: Normocephalic and atraumatic.  Eyes:     Pupils: Pupils are equal, round, and reactive to light.  Neck:     Musculoskeletal: Normal range of motion.  Cardiovascular:     Rate and Rhythm: Normal rate and regular rhythm.     Heart sounds: Normal heart sounds.  Pulmonary:     Effort: Pulmonary effort is normal.     Breath sounds: Normal breath sounds.  Abdominal:     General: Bowel sounds are normal.     Palpations: Abdomen is soft.  Musculoskeletal: Normal range of motion.  General: No tenderness or deformity.     Right lower leg: Edema present.     Left lower leg: Edema present.  Lymphadenopathy:     Cervical: No cervical adenopathy.  Skin:    General: Skin is warm and dry.     Findings: No erythema or rash.  Neurological:     Mental Status: He is alert and oriented to person, place, and time.  Psychiatric:        Behavior: Behavior normal.        Thought Content: Thought content normal.        Judgment: Judgment normal.      Lab Results  Component Value Date   WBC 4.1 02/06/2019   HGB 9.7 (L) 02/06/2019   HCT 31.3 (L) 02/06/2019   MCV 95.7 02/06/2019   PLT 373 02/06/2019     Chemistry      Component Value Date/Time   NA 138 10/27/2018 1306   NA 148 (H) 09/16/2017 1051   K 3.8 10/27/2018 1306   K 4.6 09/16/2017 1051   CL 105 10/27/2018 1306   CL 107 09/16/2017 1051   CO2 25 10/27/2018 1306   CO2 29 09/16/2017 1051   BUN 19 10/27/2018 1306   BUN 20 09/16/2017 1051   CREATININE 1.62 (H) 10/27/2018 1306   CREATININE 1.7 (H) 09/16/2017 1051      Component Value Date/Time   CALCIUM 9.5 10/27/2018 1306   CALCIUM 10.1 09/16/2017 1051   ALKPHOS 62 10/27/2018 1306   ALKPHOS 62 09/16/2017 1051   AST 15 10/27/2018 1306   ALT 14 10/27/2018 1306   ALT 29 09/16/2017 1051   BILITOT 0.4 10/27/2018  1306         Impression and Plan: Louis [name] is an 83 year old white male.  He has had past thromboembolic events.  He has had past pulmonary emboli.  I forgot to mention that we did check a lupus anticoagulant study on him when we saw him in January, there was no lupus anticoagulant present.  I am worried about his anemia.  We do have to be very careful with this.  Given his age, his mild renal insufficiency, and his iron levels being marginal, he might have issues with anemia.  I really think we have to get him back in about 6 weeks.  I really want to make sure that we maintain close follow-up to him with the anemia.  Volanda Napoleon, MD 5/8/20203:07 PM

## 2019-02-08 LAB — LUPUS ANTICOAGULANT PANEL
DRVVT: 38.4 s (ref 0.0–47.0)
PTT Lupus Anticoagulant: 29 s (ref 0.0–51.9)

## 2019-02-09 ENCOUNTER — Telehealth: Payer: Self-pay | Admitting: *Deleted

## 2019-02-09 LAB — IRON AND TIBC
Iron: 85 ug/dL (ref 42–163)
Saturation Ratios: 28 % (ref 20–55)
TIBC: 304 ug/dL (ref 202–409)
UIBC: 219 ug/dL (ref 117–376)

## 2019-02-09 LAB — ERYTHROPOIETIN: Erythropoietin: 19.6 m[IU]/mL — ABNORMAL HIGH (ref 2.6–18.5)

## 2019-02-09 LAB — FERRITIN: Ferritin: 39 ng/mL (ref 24–336)

## 2019-02-09 NOTE — Telephone Encounter (Signed)
-----   Message from Volanda Napoleon, MD sent at 02/09/2019  1:10 PM EDT ----- Call - the iron levels are still ok!!!  No need for a phlebotomy.  pete

## 2019-02-09 NOTE — Telephone Encounter (Signed)
Notified pt wife iron levels are ok, no phlebotomy at this time. Wife verbalized understanding.no further concerns.

## 2019-02-12 DIAGNOSIS — M5136 Other intervertebral disc degeneration, lumbar region: Secondary | ICD-10-CM | POA: Diagnosis not present

## 2019-02-19 DIAGNOSIS — Z79891 Long term (current) use of opiate analgesic: Secondary | ICD-10-CM | POA: Diagnosis not present

## 2019-02-19 DIAGNOSIS — M545 Low back pain: Secondary | ICD-10-CM | POA: Diagnosis not present

## 2019-02-19 DIAGNOSIS — G894 Chronic pain syndrome: Secondary | ICD-10-CM | POA: Diagnosis not present

## 2019-02-19 DIAGNOSIS — M5136 Other intervertebral disc degeneration, lumbar region: Secondary | ICD-10-CM | POA: Diagnosis not present

## 2019-02-24 DIAGNOSIS — Z471 Aftercare following joint replacement surgery: Secondary | ICD-10-CM | POA: Diagnosis not present

## 2019-02-24 DIAGNOSIS — Z96611 Presence of right artificial shoulder joint: Secondary | ICD-10-CM | POA: Diagnosis not present

## 2019-02-27 MED FILL — MAGIC MW LID/MAAL/DP1:1:1: 2 | 10 days supply | Qty: 200 | Fill #2

## 2019-02-28 ENCOUNTER — Other Ambulatory Visit: Payer: Self-pay | Admitting: Family Medicine

## 2019-02-28 DIAGNOSIS — E785 Hyperlipidemia, unspecified: Secondary | ICD-10-CM

## 2019-03-03 DIAGNOSIS — M5136 Other intervertebral disc degeneration, lumbar region: Secondary | ICD-10-CM | POA: Diagnosis not present

## 2019-03-04 ENCOUNTER — Other Ambulatory Visit: Payer: Self-pay | Admitting: *Deleted

## 2019-03-04 NOTE — Telephone Encounter (Signed)
Call from pt requesting lab work be faxed to [phone]

## 2019-03-18 DIAGNOSIS — Z79891 Long term (current) use of opiate analgesic: Secondary | ICD-10-CM | POA: Diagnosis not present

## 2019-03-18 DIAGNOSIS — M5136 Other intervertebral disc degeneration, lumbar region: Secondary | ICD-10-CM | POA: Diagnosis not present

## 2019-03-18 DIAGNOSIS — G894 Chronic pain syndrome: Secondary | ICD-10-CM | POA: Diagnosis not present

## 2019-03-18 DIAGNOSIS — M545 Low back pain: Secondary | ICD-10-CM | POA: Diagnosis not present

## 2019-03-19 MED FILL — MAGIC MW LID/MAAL/DP1:1:1: 2 | 10 days supply | Qty: 200 | Fill #3

## 2019-03-26 ENCOUNTER — Encounter: Payer: Self-pay | Admitting: *Deleted

## 2019-03-26 ENCOUNTER — Inpatient Hospital Stay: Payer: Medicare Other | Attending: Hematology & Oncology | Admitting: Hematology & Oncology

## 2019-03-26 ENCOUNTER — Encounter: Payer: Self-pay | Admitting: Hematology & Oncology

## 2019-03-26 ENCOUNTER — Other Ambulatory Visit: Payer: Self-pay

## 2019-03-26 ENCOUNTER — Inpatient Hospital Stay: Payer: Medicare Other

## 2019-03-26 VITALS — BP 140/55 | HR 85 | Temp 98.4°F | Resp 18 | Wt 214.0 lb

## 2019-03-26 DIAGNOSIS — Z86711 Personal history of pulmonary embolism: Secondary | ICD-10-CM | POA: Diagnosis not present

## 2019-03-26 DIAGNOSIS — Z7901 Long term (current) use of anticoagulants: Secondary | ICD-10-CM | POA: Insufficient documentation

## 2019-03-26 DIAGNOSIS — D631 Anemia in chronic kidney disease: Secondary | ICD-10-CM | POA: Insufficient documentation

## 2019-03-26 DIAGNOSIS — M545 Low back pain: Secondary | ICD-10-CM | POA: Diagnosis not present

## 2019-03-26 DIAGNOSIS — D638 Anemia in other chronic diseases classified elsewhere: Secondary | ICD-10-CM

## 2019-03-26 DIAGNOSIS — N189 Chronic kidney disease, unspecified: Secondary | ICD-10-CM | POA: Insufficient documentation

## 2019-03-26 HISTORY — DX: Anemia in chronic kidney disease: D63.1

## 2019-03-26 LAB — CMP (CANCER CENTER ONLY)
ALT: 13 U/L (ref 0–44)
AST: 16 U/L (ref 15–41)
Albumin: 4.2 g/dL (ref 3.5–5.0)
Alkaline Phosphatase: 79 U/L (ref 38–126)
Anion gap: 8 (ref 5–15)
BUN: 19 mg/dL (ref 8–23)
CO2: 28 mmol/L (ref 22–32)
Calcium: 9.4 mg/dL (ref 8.9–10.3)
Chloride: 104 mmol/L (ref 98–111)
Creatinine: 1.66 mg/dL — ABNORMAL HIGH (ref 0.61–1.24)
GFR, Est AFR Am: 44 mL/min — ABNORMAL LOW (ref >60)
GFR, Estimated: 38 mL/min — ABNORMAL LOW (ref >60)
Glucose, Bld: 140 mg/dL — ABNORMAL HIGH (ref 70–99)
Potassium: 4.1 mmol/L (ref 3.5–5.1)
Sodium: 140 mmol/L (ref 135–145)
Total Bilirubin: 0.5 mg/dL (ref 0.3–1.2)
Total Protein: 6.5 g/dL (ref 6.5–8.1)

## 2019-03-26 LAB — CBC WITH DIFFERENTIAL (CANCER CENTER ONLY)
Abs Immature Granulocytes: 0.02 10*3/uL (ref 0.00–0.07)
Basophils Absolute: 0 10*3/uL (ref 0.0–0.1)
Basophils Relative: 0 %
Eosinophils Absolute: 0.1 10*3/uL (ref 0.0–0.5)
Eosinophils Relative: 2 %
HCT: 33.9 % — ABNORMAL LOW (ref 39.0–52.0)
Hemoglobin: 10.5 g/dL — ABNORMAL LOW (ref 13.0–17.0)
Immature Granulocytes: 0 %
Lymphocytes Relative: 32 %
Lymphs Abs: 1.5 10*3/uL (ref 0.7–4.0)
MCH: 29.2 pg (ref 26.0–34.0)
MCHC: 31 g/dL (ref 30.0–36.0)
MCV: 94.4 fL (ref 80.0–100.0)
Monocytes Absolute: 0.2 10*3/uL (ref 0.1–1.0)
Monocytes Relative: 4 %
Neutro Abs: 2.9 10*3/uL (ref 1.7–7.7)
Neutrophils Relative %: 62 %
Platelet Count: 343 10*3/uL (ref 150–400)
RBC: 3.59 MIL/uL — ABNORMAL LOW (ref 4.22–5.81)
RDW: 16.7 % — ABNORMAL HIGH (ref 11.5–15.5)
WBC Count: 4.7 10*3/uL (ref 4.0–10.5)
nRBC: 0 % (ref 0.0–0.2)

## 2019-03-26 NOTE — Progress Notes (Signed)
Hematology and Oncology Follow Up Visit  Louis [name] [id number] 23-Sep-1936 83 y.o. 03/26/2019   Principle Diagnosis:   Recurrent pulmonary embolism - ? (+) lupus anticouagulant  Hemochromatosis - Homozygous for C282Y  Current Therapy:    Eliquis 2.5 mg po bid - lifelong  Phlebotomy to keep ferritin <100     Interim History:  Louis [name] is back for follow-up.  He is not doing well at all.  He is having severe lower back pain.  He does not radiate.  He goes for a MRI tomorrow.  He has had back problems.  I think he has had back surgery before.  His anemia is a little bit better.  He does have a low erythropoietin level.  He does have some renal insufficiency.  As such, we might be able to use Aranesp if necessary.  I think with his hemoglobin 10.5 today, I am not sure if we really need to use Aranesp.  There is been no issues with bleeding.  He has had no problems with respect to hemochromatosis.  His iron studies that we saw back in May showed a ferritin of only 39 with an iron saturation of 28%.  The big news is that he and his wife are moving to [place], [place].  It probably will be moving by the end of July.  Overall, his performance status is ECOG 1.    Medications:  Current Outpatient Medications:  .  ALPRAZolam (XANAX) 0.5 MG tablet, Take 1 tablet (0.5 mg total) by mouth at bedtime as needed., Disp: 30 tablet, Rfl: 1 .  amoxicillin (AMOXIL) 500 MG capsule, Take 2,000 mg by mouth See admin instructions. For Dental Procedures, Disp: , Rfl:  .  apixaban (ELIQUIS) 2.5 MG TABS tablet, Take 1 tablet (2.5 mg total) by mouth 2 (two) times daily., Disp: 180 tablet, Rfl: 3 .  atorvastatin (LIPITOR) 20 MG tablet, TAKE 1 TABLET DAILY AT 6 P.M., Disp: 90 tablet, Rfl: 0 .  CALCIUM-VITAMIN D PO, Take 1 tablet by mouth daily. , Disp: , Rfl:  .  fenofibrate 160 MG tablet, TAKE 1 TABLET(160 MG) BY MOUTH DAILY, Disp: 90 tablet, Rfl: 0 .  fluticasone (FLONASE) 50 MCG/ACT  nasal spray, Place 2 sprays into both nostrils daily. (Patient taking differently: Place 2 sprays into both nostrils daily as needed for allergies or rhinitis. ), Disp: 16 g, Rfl: 1 .  folic acid (FOLVITE) 1 MG tablet, Take 1 mg by mouth daily.  , Disp: , Rfl:  .  glucose blood test strip, OneTouch Verio strips  U ONCE D UTD, Disp: , Rfl:  .  HYDROcodone-acetaminophen (NORCO) 10-325 MG tablet, Take 1 tablet by mouth every 4 (four) hours as needed for moderate pain or severe pain (pain)., Disp: 30 tablet, Rfl: 0 .  hydrocortisone (PROCTO-MED HC) 2.5 % rectal cream, Procto-Med HC 2.5 % topical cream perineal applicator, Disp: , Rfl:  .  hydrOXYzine (ATARAX/VISTARIL) 10 MG tablet, 1 tab po q hs  as needed for itching (Patient taking differently: Take 10 mg by mouth at bedtime as needed for itching. 1 tab po q hs  as needed for itching), Disp: 5 tablet, Rfl: 0 .  levothyroxine (SYNTHROID) 125 MCG tablet, TAKE 1 TABLET DAILY, Disp: 30 tablet, Rfl: 0 .  lidocaine (XYLOCAINE) 2 % solution, SWISH AND SPIT 5MLS BY MOUTH 4 TIMES DAILY. DO NOT SWALLOW, Disp: 200 mL, Rfl: 0 .  Liniments (BLUE-EMU SUPER STRENGTH EX), Apply 1 application topically daily as needed (  pain)., Disp: , Rfl:  .  lisinopril (PRINIVIL,ZESTRIL) 5 MG tablet, 5 mg daily., Disp: , Rfl:  .  magic mouthwash w/lidocaine SOLN, , Disp: , Rfl:  .  methocarbamol (ROBAXIN) 500 MG tablet, Take 1 tablet (500 mg total) by mouth every 6 (six) hours as needed for muscle spasms., Disp: 40 tablet, Rfl: 3 .  methotrexate (RHEUMATREX) 2.5 MG tablet, Take 20 mg by mouth every Monday. , Disp: , Rfl:  .  multivitamin (THERAGRAN) per tablet, Take 1 tablet by mouth daily.  , Disp: , Rfl:  .  ondansetron (ZOFRAN) 4 MG tablet, Take 1 tablet (4 mg total) by mouth every 8 (eight) hours as needed for nausea or vomiting., Disp: 20 tablet, Rfl: 0 .  ondansetron (ZOFRAN) 4 MG tablet, TAKE 1 TABLET EVERY 6 HOURS AS NEEDED FOR NAUSEA OR VOMITING, Disp: 40 tablet, Rfl: 0 .   pantoprazole (PROTONIX) 40 MG tablet, TAKE 1 TABLET DAILY, Disp: 90 tablet, Rfl: 4 .  pioglitazone (ACTOS) 15 MG tablet, TAKE 1 TABLET DAILY, Disp: 90 tablet, Rfl: 4 .  polyethylene glycol (MIRALAX) packet, Take 17 g by mouth daily as needed for moderate constipation. OTC, Disp: 30 each, Rfl: 3 .  predniSONE (DELTASONE) 50 MG tablet, Take 50 mg by mouth daily., Disp: , Rfl:  .  vitamin B-12 (CYANOCOBALAMIN) 1000 MCG tablet, Take 1,000 mcg by mouth daily., Disp: , Rfl:   Allergies:  Allergies  Allergen Reactions  . Shellfish Allergy Anaphylaxis, Hives and Swelling    ANGIOEDEMA  . Keflex [Cephalexin] Other (See Comments)     Potential steven johnson syndrome  . Garlic Nausea And Vomiting    And onions  . Iron Other (See Comments) and Hives    HEMACHROMATOSIS HEMACHROMATOSIS    Past Medical History, Surgical history, Social history, and Family History were reviewed and updated.  Review of Systems: Review of Systems  Constitutional: Negative for appetite change, fatigue, fever and unexpected weight change.  HENT:   Negative for lump/mass, mouth sores, sore throat and trouble swallowing.   Respiratory: Negative for cough, hemoptysis and shortness of breath.   Cardiovascular: Negative for leg swelling and palpitations.  Gastrointestinal: Negative for abdominal distention, abdominal pain, blood in stool, constipation, diarrhea, nausea and vomiting.  Genitourinary: Negative for bladder incontinence, dysuria, frequency and hematuria.   Musculoskeletal: Negative for arthralgias, back pain, gait problem and myalgias.  Skin: Negative for itching and rash.  Neurological: Negative for dizziness, extremity weakness, gait problem, headaches, numbness, seizures and speech difficulty.  Hematological: Does not bruise/bleed easily.  Psychiatric/Behavioral: Negative for depression and sleep disturbance. The patient is not nervous/anxious.     Physical Exam:  weight is 214 lb (97.1 kg). His oral  temperature is 98.4 F (36.9 C). His blood pressure is 140/55 (abnormal) and his pulse is 85. His respiration is 18 and oxygen saturation is 100%.   Wt Readings from Last 3 Encounters:  03/26/19 214 lb (97.1 kg)  02/06/19 217 lb 12.8 oz (98.8 kg)  10/27/18 219 lb (99.3 kg)    Physical Exam Vitals signs reviewed.  HENT:     Head: Normocephalic and atraumatic.  Eyes:     Pupils: Pupils are equal, round, and reactive to light.  Neck:     Musculoskeletal: Normal range of motion.  Cardiovascular:     Rate and Rhythm: Normal rate and regular rhythm.     Heart sounds: Normal heart sounds.  Pulmonary:     Effort: Pulmonary effort is normal.  Breath sounds: Normal breath sounds.  Abdominal:     General: Bowel sounds are normal.     Palpations: Abdomen is soft.  Musculoskeletal: Normal range of motion.        General: No tenderness or deformity.     Right lower leg: Edema present.     Left lower leg: Edema present.  Lymphadenopathy:     Cervical: No cervical adenopathy.  Skin:    General: Skin is warm and dry.     Findings: No erythema or rash.  Neurological:     Mental Status: He is alert and oriented to person, place, and time.  Psychiatric:        Behavior: Behavior normal.        Thought Content: Thought content normal.        Judgment: Judgment normal.      Lab Results  Component Value Date   WBC 4.7 03/26/2019   HGB 10.5 (L) 03/26/2019   HCT 33.9 (L) 03/26/2019   MCV 94.4 03/26/2019   PLT 343 03/26/2019     Chemistry      Component Value Date/Time   NA 140 03/26/2019 1305   NA 148 (H) 09/16/2017 1051   K 4.1 03/26/2019 1305   K 4.6 09/16/2017 1051   CL 104 03/26/2019 1305   CL 107 09/16/2017 1051   CO2 28 03/26/2019 1305   CO2 29 09/16/2017 1051   BUN 19 03/26/2019 1305   BUN 20 09/16/2017 1051   CREATININE 1.66 (H) 03/26/2019 1305   CREATININE 1.7 (H) 09/16/2017 1051      Component Value Date/Time   CALCIUM 9.4 03/26/2019 1305   CALCIUM 10.1  09/16/2017 1051   ALKPHOS 79 03/26/2019 1305   ALKPHOS 62 09/16/2017 1051   AST 16 03/26/2019 1305   ALT 13 03/26/2019 1305   ALT 29 09/16/2017 1051   BILITOT 0.5 03/26/2019 1305         Impression and Plan: Mr. Slimp is an 83 year old white male.  He has had past thromboembolic events.  He has had past pulmonary emboli.  He is on low-dose Eliquis.  This is lifelong.  I think this will clearly be helpful for him.  We will have to see what is blood counts look like before he goes off to [place], [place].  I will see him back in a month.  If we have to use Aranesp, I am sure we probably could.  He is on blood thinner.  His blood pressure has not been all that bad.    Volanda Napoleon, MD 6/25/20202:18 PM

## 2019-03-26 NOTE — Progress Notes (Addendum)
CBC and CMET results faxed to Dr. Nelva Bush at [facility] and to Dr. Leigh Aurora per pt.'s wife request.

## 2019-03-27 DIAGNOSIS — M545 Low back pain: Secondary | ICD-10-CM | POA: Diagnosis not present

## 2019-03-27 LAB — IRON AND TIBC
Iron: 164 ug/dL — ABNORMAL HIGH (ref 42–163)
Saturation Ratios: 51 % (ref 20–55)
TIBC: 321 ug/dL (ref 202–409)
UIBC: 157 ug/dL (ref 117–376)

## 2019-03-27 LAB — FERRITIN: Ferritin: 33 ng/mL (ref 24–336)

## 2019-03-31 ENCOUNTER — Other Ambulatory Visit: Payer: Self-pay | Admitting: Family Medicine

## 2019-04-08 DIAGNOSIS — M5416 Radiculopathy, lumbar region: Secondary | ICD-10-CM | POA: Diagnosis not present

## 2019-04-08 DIAGNOSIS — S32030A Wedge compression fracture of third lumbar vertebra, initial encounter for closed fracture: Secondary | ICD-10-CM | POA: Diagnosis not present

## 2019-04-09 MED FILL — MAGIC MW LID/MAAL/DP1:1:1: 2 | 10 days supply | Qty: 200 | Fill #4

## 2019-04-15 ENCOUNTER — Other Ambulatory Visit: Payer: Self-pay | Admitting: Family Medicine

## 2019-04-20 ENCOUNTER — Ambulatory Visit: Payer: Medicare Other | Admitting: Hematology & Oncology

## 2019-04-20 ENCOUNTER — Other Ambulatory Visit: Payer: Medicare Other

## 2019-04-20 DIAGNOSIS — M5416 Radiculopathy, lumbar region: Secondary | ICD-10-CM | POA: Diagnosis not present

## 2019-04-20 DIAGNOSIS — S32030A Wedge compression fracture of third lumbar vertebra, initial encounter for closed fracture: Secondary | ICD-10-CM | POA: Diagnosis not present

## 2019-04-21 DIAGNOSIS — E039 Hypothyroidism, unspecified: Secondary | ICD-10-CM | POA: Diagnosis not present

## 2019-04-21 DIAGNOSIS — M79604 Pain in right leg: Secondary | ICD-10-CM | POA: Diagnosis not present

## 2019-04-21 DIAGNOSIS — E7849 Other hyperlipidemia: Secondary | ICD-10-CM | POA: Diagnosis not present

## 2019-04-21 DIAGNOSIS — Z86711 Personal history of pulmonary embolism: Secondary | ICD-10-CM | POA: Diagnosis not present

## 2019-04-21 DIAGNOSIS — Z79899 Other long term (current) drug therapy: Secondary | ICD-10-CM | POA: Diagnosis not present

## 2019-04-21 DIAGNOSIS — Z7901 Long term (current) use of anticoagulants: Secondary | ICD-10-CM | POA: Diagnosis not present

## 2019-04-21 DIAGNOSIS — F419 Anxiety disorder, unspecified: Secondary | ICD-10-CM | POA: Diagnosis not present

## 2019-04-21 DIAGNOSIS — Z86718 Personal history of other venous thrombosis and embolism: Secondary | ICD-10-CM | POA: Diagnosis not present

## 2019-04-21 DIAGNOSIS — M810 Age-related osteoporosis without current pathological fracture: Secondary | ICD-10-CM | POA: Diagnosis not present

## 2019-04-21 DIAGNOSIS — Z7984 Long term (current) use of oral hypoglycemic drugs: Secondary | ICD-10-CM | POA: Diagnosis not present

## 2019-04-21 DIAGNOSIS — E119 Type 2 diabetes mellitus without complications: Secondary | ICD-10-CM | POA: Diagnosis not present

## 2019-04-21 DIAGNOSIS — M4856XA Collapsed vertebra, not elsewhere classified, lumbar region, initial encounter for fracture: Secondary | ICD-10-CM | POA: Diagnosis not present

## 2019-04-21 DIAGNOSIS — M5116 Intervertebral disc disorders with radiculopathy, lumbar region: Secondary | ICD-10-CM | POA: Diagnosis not present

## 2019-04-21 DIAGNOSIS — Z01818 Encounter for other preprocedural examination: Secondary | ICD-10-CM | POA: Diagnosis not present

## 2019-04-21 DIAGNOSIS — Z87891 Personal history of nicotine dependence: Secondary | ICD-10-CM | POA: Diagnosis not present

## 2019-04-24 DIAGNOSIS — I2699 Other pulmonary embolism without acute cor pulmonale: Secondary | ICD-10-CM | POA: Diagnosis not present

## 2019-04-24 DIAGNOSIS — E119 Type 2 diabetes mellitus without complications: Secondary | ICD-10-CM | POA: Diagnosis not present

## 2019-04-24 DIAGNOSIS — M5416 Radiculopathy, lumbar region: Secondary | ICD-10-CM | POA: Diagnosis not present

## 2019-04-24 DIAGNOSIS — E039 Hypothyroidism, unspecified: Secondary | ICD-10-CM | POA: Diagnosis not present

## 2019-04-24 DIAGNOSIS — M5126 Other intervertebral disc displacement, lumbar region: Secondary | ICD-10-CM | POA: Diagnosis not present

## 2019-04-24 DIAGNOSIS — M810 Age-related osteoporosis without current pathological fracture: Secondary | ICD-10-CM | POA: Diagnosis not present

## 2019-04-24 DIAGNOSIS — F419 Anxiety disorder, unspecified: Secondary | ICD-10-CM | POA: Diagnosis not present

## 2019-04-24 DIAGNOSIS — E7849 Other hyperlipidemia: Secondary | ICD-10-CM | POA: Diagnosis not present

## 2019-04-24 DIAGNOSIS — M4856XA Collapsed vertebra, not elsewhere classified, lumbar region, initial encounter for fracture: Secondary | ICD-10-CM | POA: Diagnosis not present

## 2019-04-25 DIAGNOSIS — E119 Type 2 diabetes mellitus without complications: Secondary | ICD-10-CM | POA: Diagnosis not present

## 2019-04-25 DIAGNOSIS — E7849 Other hyperlipidemia: Secondary | ICD-10-CM | POA: Diagnosis not present

## 2019-04-25 DIAGNOSIS — M4856XA Collapsed vertebra, not elsewhere classified, lumbar region, initial encounter for fracture: Secondary | ICD-10-CM | POA: Diagnosis not present

## 2019-04-25 DIAGNOSIS — M810 Age-related osteoporosis without current pathological fracture: Secondary | ICD-10-CM | POA: Diagnosis not present

## 2019-04-25 DIAGNOSIS — E039 Hypothyroidism, unspecified: Secondary | ICD-10-CM | POA: Diagnosis not present

## 2019-04-25 DIAGNOSIS — F419 Anxiety disorder, unspecified: Secondary | ICD-10-CM | POA: Diagnosis not present

## 2019-05-15 DIAGNOSIS — S32030S Wedge compression fracture of third lumbar vertebra, sequela: Secondary | ICD-10-CM | POA: Diagnosis not present

## 2019-05-15 DIAGNOSIS — M4316 Spondylolisthesis, lumbar region: Secondary | ICD-10-CM | POA: Diagnosis not present

## 2019-05-15 DIAGNOSIS — Z981 Arthrodesis status: Secondary | ICD-10-CM | POA: Diagnosis not present

## 2019-05-15 DIAGNOSIS — M5126 Other intervertebral disc displacement, lumbar region: Secondary | ICD-10-CM | POA: Diagnosis not present

## 2019-05-15 DIAGNOSIS — M5416 Radiculopathy, lumbar region: Secondary | ICD-10-CM | POA: Diagnosis not present

## 2019-05-15 DIAGNOSIS — S22080S Wedge compression fracture of T11-T12 vertebra, sequela: Secondary | ICD-10-CM | POA: Diagnosis not present

## 2019-05-15 MED FILL — MAGIC MW LID/MAAL/DP1:1:1: 2 | 10 days supply | Qty: 200 | Fill #5

## 2019-05-18 DIAGNOSIS — I2699 Other pulmonary embolism without acute cor pulmonale: Secondary | ICD-10-CM | POA: Diagnosis not present

## 2019-05-18 DIAGNOSIS — E7849 Other hyperlipidemia: Secondary | ICD-10-CM | POA: Diagnosis not present

## 2019-05-18 DIAGNOSIS — Z87891 Personal history of nicotine dependence: Secondary | ICD-10-CM | POA: Diagnosis not present

## 2019-05-18 DIAGNOSIS — S32040D Wedge compression fracture of fourth lumbar vertebra, subsequent encounter for fracture with routine healing: Secondary | ICD-10-CM | POA: Diagnosis not present

## 2019-05-18 DIAGNOSIS — M4856XA Collapsed vertebra, not elsewhere classified, lumbar region, initial encounter for fracture: Secondary | ICD-10-CM | POA: Diagnosis not present

## 2019-05-18 DIAGNOSIS — Z7984 Long term (current) use of oral hypoglycemic drugs: Secondary | ICD-10-CM | POA: Diagnosis not present

## 2019-05-18 DIAGNOSIS — E039 Hypothyroidism, unspecified: Secondary | ICD-10-CM | POA: Diagnosis not present

## 2019-05-18 DIAGNOSIS — Z86718 Personal history of other venous thrombosis and embolism: Secondary | ICD-10-CM | POA: Diagnosis not present

## 2019-05-18 DIAGNOSIS — M069 Rheumatoid arthritis, unspecified: Secondary | ICD-10-CM | POA: Diagnosis not present

## 2019-05-18 DIAGNOSIS — Z86711 Personal history of pulmonary embolism: Secondary | ICD-10-CM | POA: Diagnosis not present

## 2019-05-18 DIAGNOSIS — M5416 Radiculopathy, lumbar region: Secondary | ICD-10-CM | POA: Diagnosis not present

## 2019-05-18 DIAGNOSIS — Z7901 Long term (current) use of anticoagulants: Secondary | ICD-10-CM | POA: Diagnosis not present

## 2019-05-18 DIAGNOSIS — N183 Chronic kidney disease, stage 3 (moderate): Secondary | ICD-10-CM | POA: Diagnosis not present

## 2019-05-18 DIAGNOSIS — Z79899 Other long term (current) drug therapy: Secondary | ICD-10-CM | POA: Diagnosis not present

## 2019-05-18 DIAGNOSIS — E1122 Type 2 diabetes mellitus with diabetic chronic kidney disease: Secondary | ICD-10-CM | POA: Diagnosis not present

## 2019-05-18 DIAGNOSIS — I129 Hypertensive chronic kidney disease with stage 1 through stage 4 chronic kidney disease, or unspecified chronic kidney disease: Secondary | ICD-10-CM | POA: Diagnosis not present

## 2019-05-20 DIAGNOSIS — I129 Hypertensive chronic kidney disease with stage 1 through stage 4 chronic kidney disease, or unspecified chronic kidney disease: Secondary | ICD-10-CM | POA: Diagnosis not present

## 2019-05-20 DIAGNOSIS — S32049D Unspecified fracture of fourth lumbar vertebra, subsequent encounter for fracture with routine healing: Secondary | ICD-10-CM | POA: Diagnosis not present

## 2019-05-20 DIAGNOSIS — M4856XA Collapsed vertebra, not elsewhere classified, lumbar region, initial encounter for fracture: Secondary | ICD-10-CM | POA: Diagnosis not present

## 2019-05-20 DIAGNOSIS — E039 Hypothyroidism, unspecified: Secondary | ICD-10-CM | POA: Diagnosis not present

## 2019-05-20 DIAGNOSIS — E7849 Other hyperlipidemia: Secondary | ICD-10-CM | POA: Diagnosis not present

## 2019-05-20 DIAGNOSIS — E1122 Type 2 diabetes mellitus with diabetic chronic kidney disease: Secondary | ICD-10-CM | POA: Diagnosis not present

## 2019-05-20 DIAGNOSIS — I2699 Other pulmonary embolism without acute cor pulmonale: Secondary | ICD-10-CM | POA: Diagnosis not present

## 2019-05-20 DIAGNOSIS — E119 Type 2 diabetes mellitus without complications: Secondary | ICD-10-CM | POA: Diagnosis not present

## 2019-05-20 DIAGNOSIS — M069 Rheumatoid arthritis, unspecified: Secondary | ICD-10-CM | POA: Diagnosis not present

## 2019-05-28 DIAGNOSIS — S32040D Wedge compression fracture of fourth lumbar vertebra, subsequent encounter for fracture with routine healing: Secondary | ICD-10-CM | POA: Diagnosis not present

## 2019-05-28 DIAGNOSIS — S32030D Wedge compression fracture of third lumbar vertebra, subsequent encounter for fracture with routine healing: Secondary | ICD-10-CM | POA: Diagnosis not present

## 2019-05-28 DIAGNOSIS — I10 Essential (primary) hypertension: Secondary | ICD-10-CM | POA: Diagnosis not present

## 2019-05-28 DIAGNOSIS — M069 Rheumatoid arthritis, unspecified: Secondary | ICD-10-CM | POA: Diagnosis not present

## 2019-05-28 DIAGNOSIS — K219 Gastro-esophageal reflux disease without esophagitis: Secondary | ICD-10-CM | POA: Diagnosis not present

## 2019-05-28 DIAGNOSIS — Z86718 Personal history of other venous thrombosis and embolism: Secondary | ICD-10-CM | POA: Diagnosis not present

## 2019-05-28 DIAGNOSIS — F419 Anxiety disorder, unspecified: Secondary | ICD-10-CM | POA: Diagnosis not present

## 2019-05-28 DIAGNOSIS — E119 Type 2 diabetes mellitus without complications: Secondary | ICD-10-CM | POA: Diagnosis not present

## 2019-05-28 DIAGNOSIS — L98491 Non-pressure chronic ulcer of skin of other sites limited to breakdown of skin: Secondary | ICD-10-CM | POA: Diagnosis not present

## 2019-06-02 ENCOUNTER — Other Ambulatory Visit: Payer: Self-pay | Admitting: *Deleted

## 2019-06-02 DIAGNOSIS — E785 Hyperlipidemia, unspecified: Secondary | ICD-10-CM

## 2019-06-02 MED ORDER — ATORVASTATIN CALCIUM 20 MG PO TABS: ORAL_TABLET | ORAL | 0 refills | Status: DC

## 2019-06-10 DIAGNOSIS — C4481 Basal cell carcinoma of overlapping sites of skin: Secondary | ICD-10-CM | POA: Diagnosis not present

## 2019-06-10 DIAGNOSIS — D489 Neoplasm of uncertain behavior, unspecified: Secondary | ICD-10-CM | POA: Diagnosis not present

## 2019-06-10 DIAGNOSIS — C44619 Basal cell carcinoma of skin of left upper limb, including shoulder: Secondary | ICD-10-CM | POA: Diagnosis not present

## 2019-07-03 DIAGNOSIS — Z01812 Encounter for preprocedural laboratory examination: Secondary | ICD-10-CM | POA: Diagnosis not present

## 2019-07-03 DIAGNOSIS — Z20828 Contact with and (suspected) exposure to other viral communicable diseases: Secondary | ICD-10-CM | POA: Diagnosis not present

## 2019-07-03 DIAGNOSIS — C44319 Basal cell carcinoma of skin of other parts of face: Secondary | ICD-10-CM | POA: Diagnosis not present

## 2019-07-06 DIAGNOSIS — C44319 Basal cell carcinoma of skin of other parts of face: Secondary | ICD-10-CM | POA: Diagnosis not present

## 2019-07-22 DIAGNOSIS — G894 Chronic pain syndrome: Secondary | ICD-10-CM | POA: Diagnosis not present

## 2019-07-27 ENCOUNTER — Telehealth: Payer: Self-pay | Admitting: Family Medicine

## 2019-07-27 ENCOUNTER — Telehealth: Payer: Self-pay | Admitting: *Deleted

## 2019-07-27 NOTE — Telephone Encounter (Signed)
1/2 mg Alprazolam is on back order and do you want to change the mg or drug. Please call to advise

## 2019-07-27 NOTE — Telephone Encounter (Signed)
Left message on machine for patient to call back to schedule follow up visit.  It has been over a year since we have seen him.

## 2019-07-27 NOTE — Telephone Encounter (Signed)
Please advise 

## 2019-07-27 NOTE — Telephone Encounter (Signed)
We can write 0.25 and can take 2 if needed

## 2019-07-28 MED ORDER — ALPRAZOLAM 0.25 MG PO TABS: 0.5000 mg | ORAL_TABLET | Freq: Every evening | ORAL | 0 refills | Status: AC | PRN

## 2019-07-28 NOTE — Telephone Encounter (Signed)
rx faxed in for 0.25mg 

## 2019-07-29 DIAGNOSIS — Z23 Encounter for immunization: Secondary | ICD-10-CM | POA: Diagnosis not present

## 2019-07-29 DIAGNOSIS — M800AXD Age-related osteoporosis with current pathological fracture, other site, subsequent encounter for fracture with routine healing: Secondary | ICD-10-CM | POA: Diagnosis not present

## 2019-07-29 DIAGNOSIS — E119 Type 2 diabetes mellitus without complications: Secondary | ICD-10-CM | POA: Diagnosis not present

## 2019-07-29 DIAGNOSIS — F419 Anxiety disorder, unspecified: Secondary | ICD-10-CM | POA: Diagnosis not present

## 2019-07-29 DIAGNOSIS — M069 Rheumatoid arthritis, unspecified: Secondary | ICD-10-CM | POA: Diagnosis not present

## 2019-07-29 DIAGNOSIS — K1370 Unspecified lesions of oral mucosa: Secondary | ICD-10-CM | POA: Diagnosis not present

## 2019-08-29 ENCOUNTER — Other Ambulatory Visit: Payer: Self-pay | Admitting: Family Medicine

## 2019-08-29 DIAGNOSIS — E785 Hyperlipidemia, unspecified: Secondary | ICD-10-CM

## 2019-08-31 DIAGNOSIS — S32030A Wedge compression fracture of third lumbar vertebra, initial encounter for closed fracture: Secondary | ICD-10-CM | POA: Diagnosis not present

## 2019-08-31 DIAGNOSIS — S32040D Wedge compression fracture of fourth lumbar vertebra, subsequent encounter for fracture with routine healing: Secondary | ICD-10-CM | POA: Diagnosis not present

## 2019-08-31 DIAGNOSIS — M8000XD Age-related osteoporosis with current pathological fracture, unspecified site, subsequent encounter for fracture with routine healing: Secondary | ICD-10-CM | POA: Diagnosis not present

## 2019-08-31 DIAGNOSIS — R2689 Other abnormalities of gait and mobility: Secondary | ICD-10-CM | POA: Diagnosis not present

## 2019-09-04 DIAGNOSIS — S32040D Wedge compression fracture of fourth lumbar vertebra, subsequent encounter for fracture with routine healing: Secondary | ICD-10-CM | POA: Diagnosis not present

## 2019-09-04 DIAGNOSIS — R2689 Other abnormalities of gait and mobility: Secondary | ICD-10-CM | POA: Diagnosis not present

## 2019-09-04 DIAGNOSIS — S32030A Wedge compression fracture of third lumbar vertebra, initial encounter for closed fracture: Secondary | ICD-10-CM | POA: Diagnosis not present

## 2019-09-07 DIAGNOSIS — S32040D Wedge compression fracture of fourth lumbar vertebra, subsequent encounter for fracture with routine healing: Secondary | ICD-10-CM | POA: Diagnosis not present

## 2019-09-07 DIAGNOSIS — S32030A Wedge compression fracture of third lumbar vertebra, initial encounter for closed fracture: Secondary | ICD-10-CM | POA: Diagnosis not present

## 2019-09-07 DIAGNOSIS — R2689 Other abnormalities of gait and mobility: Secondary | ICD-10-CM | POA: Diagnosis not present

## 2019-09-10 ENCOUNTER — Other Ambulatory Visit: Payer: Self-pay | Admitting: Family Medicine

## 2019-09-10 DIAGNOSIS — E119 Type 2 diabetes mellitus without complications: Secondary | ICD-10-CM

## 2019-09-10 DIAGNOSIS — S32030A Wedge compression fracture of third lumbar vertebra, initial encounter for closed fracture: Secondary | ICD-10-CM | POA: Diagnosis not present

## 2019-09-10 DIAGNOSIS — R2689 Other abnormalities of gait and mobility: Secondary | ICD-10-CM | POA: Diagnosis not present

## 2019-09-10 DIAGNOSIS — S32040D Wedge compression fracture of fourth lumbar vertebra, subsequent encounter for fracture with routine healing: Secondary | ICD-10-CM | POA: Diagnosis not present

## 2019-10-08 DIAGNOSIS — S32040D Wedge compression fracture of fourth lumbar vertebra, subsequent encounter for fracture with routine healing: Secondary | ICD-10-CM | POA: Diagnosis not present

## 2019-10-08 DIAGNOSIS — S32030A Wedge compression fracture of third lumbar vertebra, initial encounter for closed fracture: Secondary | ICD-10-CM | POA: Diagnosis not present

## 2019-10-08 DIAGNOSIS — R2689 Other abnormalities of gait and mobility: Secondary | ICD-10-CM | POA: Diagnosis not present

## 2019-10-12 DIAGNOSIS — S32040D Wedge compression fracture of fourth lumbar vertebra, subsequent encounter for fracture with routine healing: Secondary | ICD-10-CM | POA: Diagnosis not present

## 2019-10-12 DIAGNOSIS — S32030A Wedge compression fracture of third lumbar vertebra, initial encounter for closed fracture: Secondary | ICD-10-CM | POA: Diagnosis not present

## 2019-10-12 DIAGNOSIS — R2689 Other abnormalities of gait and mobility: Secondary | ICD-10-CM | POA: Diagnosis not present

## 2019-10-14 DIAGNOSIS — R2689 Other abnormalities of gait and mobility: Secondary | ICD-10-CM | POA: Diagnosis not present

## 2019-10-14 DIAGNOSIS — S32030A Wedge compression fracture of third lumbar vertebra, initial encounter for closed fracture: Secondary | ICD-10-CM | POA: Diagnosis not present

## 2019-10-14 DIAGNOSIS — S32040D Wedge compression fracture of fourth lumbar vertebra, subsequent encounter for fracture with routine healing: Secondary | ICD-10-CM | POA: Diagnosis not present

## 2019-10-16 DIAGNOSIS — Z01812 Encounter for preprocedural laboratory examination: Secondary | ICD-10-CM | POA: Diagnosis not present

## 2019-10-16 DIAGNOSIS — Z20822 Contact with and (suspected) exposure to covid-19: Secondary | ICD-10-CM | POA: Diagnosis not present

## 2019-10-16 DIAGNOSIS — Z1152 Encounter for screening for COVID-19: Secondary | ICD-10-CM | POA: Diagnosis not present

## 2019-10-16 DIAGNOSIS — C44311 Basal cell carcinoma of skin of nose: Secondary | ICD-10-CM | POA: Diagnosis not present

## 2019-10-20 DIAGNOSIS — C44311 Basal cell carcinoma of skin of nose: Secondary | ICD-10-CM | POA: Diagnosis not present

## 2019-10-23 DIAGNOSIS — S32030A Wedge compression fracture of third lumbar vertebra, initial encounter for closed fracture: Secondary | ICD-10-CM | POA: Diagnosis not present

## 2019-10-23 DIAGNOSIS — R2689 Other abnormalities of gait and mobility: Secondary | ICD-10-CM | POA: Diagnosis not present

## 2019-10-23 DIAGNOSIS — S32040D Wedge compression fracture of fourth lumbar vertebra, subsequent encounter for fracture with routine healing: Secondary | ICD-10-CM | POA: Diagnosis not present

## 2019-10-27 ENCOUNTER — Other Ambulatory Visit: Payer: Self-pay | Admitting: Family Medicine

## 2019-10-27 DIAGNOSIS — L570 Actinic keratosis: Secondary | ICD-10-CM | POA: Diagnosis not present

## 2019-10-27 DIAGNOSIS — D485 Neoplasm of uncertain behavior of skin: Secondary | ICD-10-CM | POA: Diagnosis not present

## 2019-10-27 DIAGNOSIS — L814 Other melanin hyperpigmentation: Secondary | ICD-10-CM | POA: Diagnosis not present

## 2019-10-27 DIAGNOSIS — R2689 Other abnormalities of gait and mobility: Secondary | ICD-10-CM | POA: Diagnosis not present

## 2019-10-27 DIAGNOSIS — L821 Other seborrheic keratosis: Secondary | ICD-10-CM | POA: Diagnosis not present

## 2019-10-27 DIAGNOSIS — S32040D Wedge compression fracture of fourth lumbar vertebra, subsequent encounter for fracture with routine healing: Secondary | ICD-10-CM | POA: Diagnosis not present

## 2019-10-27 DIAGNOSIS — S32030A Wedge compression fracture of third lumbar vertebra, initial encounter for closed fracture: Secondary | ICD-10-CM | POA: Diagnosis not present

## 2019-11-03 DIAGNOSIS — M65872 Other synovitis and tenosynovitis, left ankle and foot: Secondary | ICD-10-CM | POA: Diagnosis not present

## 2019-11-03 DIAGNOSIS — I83892 Varicose veins of left lower extremities with other complications: Secondary | ICD-10-CM | POA: Diagnosis not present

## 2019-11-03 DIAGNOSIS — M7752 Other enthesopathy of left foot: Secondary | ICD-10-CM | POA: Diagnosis not present

## 2019-11-04 DIAGNOSIS — S32030A Wedge compression fracture of third lumbar vertebra, initial encounter for closed fracture: Secondary | ICD-10-CM | POA: Diagnosis not present

## 2019-11-12 DIAGNOSIS — M84362A Stress fracture, left tibia, initial encounter for fracture: Secondary | ICD-10-CM | POA: Diagnosis not present

## 2019-11-12 DIAGNOSIS — M19072 Primary osteoarthritis, left ankle and foot: Secondary | ICD-10-CM | POA: Diagnosis not present

## 2019-11-19 DIAGNOSIS — M8000XA Age-related osteoporosis with current pathological fracture, unspecified site, initial encounter for fracture: Secondary | ICD-10-CM | POA: Diagnosis not present

## 2019-11-19 DIAGNOSIS — N183 Chronic kidney disease, stage 3 unspecified: Secondary | ICD-10-CM | POA: Diagnosis not present

## 2019-11-19 DIAGNOSIS — F419 Anxiety disorder, unspecified: Secondary | ICD-10-CM | POA: Diagnosis not present

## 2019-11-19 DIAGNOSIS — M0600 Rheumatoid arthritis without rheumatoid factor, unspecified site: Secondary | ICD-10-CM | POA: Diagnosis not present

## 2019-11-19 DIAGNOSIS — E1122 Type 2 diabetes mellitus with diabetic chronic kidney disease: Secondary | ICD-10-CM | POA: Diagnosis not present

## 2019-11-19 DIAGNOSIS — E039 Hypothyroidism, unspecified: Secondary | ICD-10-CM | POA: Diagnosis not present

## 2019-11-19 DIAGNOSIS — K439 Ventral hernia without obstruction or gangrene: Secondary | ICD-10-CM | POA: Diagnosis not present

## 2019-12-12 ENCOUNTER — Other Ambulatory Visit: Payer: Self-pay | Admitting: Nurse Practitioner
